# Patient Record
Sex: Male | Born: 1940
Health system: Southern US, Community
[De-identification: ages and names within clinical notes are randomized; demographics above are authoritative.]

## PROBLEM LIST (undated history)

## (undated) DIAGNOSIS — M549 Dorsalgia, unspecified: Secondary | ICD-10-CM

## (undated) DIAGNOSIS — E785 Hyperlipidemia, unspecified: Secondary | ICD-10-CM

## (undated) DIAGNOSIS — M4722 Other spondylosis with radiculopathy, cervical region: Secondary | ICD-10-CM

## (undated) DIAGNOSIS — C61 Malignant neoplasm of prostate: Secondary | ICD-10-CM

## (undated) DIAGNOSIS — F419 Anxiety disorder, unspecified: Secondary | ICD-10-CM

## (undated) DIAGNOSIS — K219 Gastro-esophageal reflux disease without esophagitis: Secondary | ICD-10-CM

## (undated) DIAGNOSIS — C801 Malignant (primary) neoplasm, unspecified: Secondary | ICD-10-CM

## (undated) DIAGNOSIS — I10 Essential (primary) hypertension: Secondary | ICD-10-CM

## (undated) DIAGNOSIS — T7840XA Allergy, unspecified, initial encounter: Secondary | ICD-10-CM

## (undated) DIAGNOSIS — J45909 Unspecified asthma, uncomplicated: Secondary | ICD-10-CM

## (undated) DIAGNOSIS — I480 Paroxysmal atrial fibrillation: Secondary | ICD-10-CM

## (undated) DIAGNOSIS — G8929 Other chronic pain: Secondary | ICD-10-CM

## (undated) HISTORY — DX: Malignant (primary) neoplasm, unspecified: C80.1

## (undated) HISTORY — DX: Unspecified asthma, uncomplicated: J45.909

## (undated) HISTORY — PX: PROSTATE BIOPSY: SHX241

## (undated) HISTORY — DX: Hyperlipidemia, unspecified: E78.5

## (undated) HISTORY — DX: Essential (primary) hypertension: I10

## (undated) HISTORY — PX: BACK SURGERY: SHX140

## (undated) HISTORY — PX: SPINE SURGERY: SHX786

## (undated) HISTORY — DX: Allergy, unspecified, initial encounter: T78.40XA

## (undated) HISTORY — DX: Other spondylosis with radiculopathy, cervical region: M47.22

## (undated) HISTORY — DX: Gastro-esophageal reflux disease without esophagitis: K21.9

---

## 2003-11-21 ENCOUNTER — Encounter: Admission: RE | Admit: 2003-11-21 | Discharge: 2003-11-21 | Payer: Self-pay | Admitting: Internal Medicine

## 2003-11-25 ENCOUNTER — Emergency Department (HOSPITAL_COMMUNITY): Admission: EM | Admit: 2003-11-25 | Discharge: 2003-11-25 | Payer: Self-pay | Admitting: Emergency Medicine

## 2004-01-13 ENCOUNTER — Ambulatory Visit (HOSPITAL_BASED_OUTPATIENT_CLINIC_OR_DEPARTMENT_OTHER): Admission: RE | Admit: 2004-01-13 | Discharge: 2004-01-13 | Payer: Self-pay | Admitting: Otolaryngology

## 2004-01-13 HISTORY — PX: OTHER SURGICAL HISTORY: SHX169

## 2004-06-25 ENCOUNTER — Ambulatory Visit (HOSPITAL_COMMUNITY): Admission: RE | Admit: 2004-06-25 | Discharge: 2004-06-25 | Payer: Self-pay | Admitting: Gastroenterology

## 2007-02-22 ENCOUNTER — Emergency Department (HOSPITAL_COMMUNITY): Admission: EM | Admit: 2007-02-22 | Discharge: 2007-02-22 | Payer: Self-pay | Admitting: Emergency Medicine

## 2008-03-17 ENCOUNTER — Encounter: Admission: RE | Admit: 2008-03-17 | Discharge: 2008-03-17 | Payer: Self-pay | Admitting: Gastroenterology

## 2009-03-02 ENCOUNTER — Encounter: Payer: Self-pay | Admitting: Cardiology

## 2009-03-10 ENCOUNTER — Encounter: Payer: Self-pay | Admitting: Cardiology

## 2009-04-24 ENCOUNTER — Ambulatory Visit: Payer: Self-pay | Admitting: Cardiology

## 2009-04-24 DIAGNOSIS — E7800 Pure hypercholesterolemia, unspecified: Secondary | ICD-10-CM | POA: Insufficient documentation

## 2009-06-02 ENCOUNTER — Encounter (HOSPITAL_COMMUNITY): Admission: RE | Admit: 2009-06-02 | Discharge: 2009-08-12 | Payer: Self-pay | Admitting: Cardiology

## 2009-06-02 ENCOUNTER — Ambulatory Visit: Payer: Self-pay

## 2009-06-02 ENCOUNTER — Ambulatory Visit: Payer: Self-pay | Admitting: Cardiovascular Disease

## 2009-06-02 ENCOUNTER — Ambulatory Visit: Payer: Self-pay | Admitting: Cardiology

## 2009-08-08 ENCOUNTER — Emergency Department (HOSPITAL_COMMUNITY): Admission: EM | Admit: 2009-08-08 | Discharge: 2009-08-08 | Payer: Self-pay | Admitting: Emergency Medicine

## 2009-08-10 ENCOUNTER — Encounter: Payer: Self-pay | Admitting: Internal Medicine

## 2009-08-10 ENCOUNTER — Encounter: Admission: RE | Admit: 2009-08-10 | Discharge: 2009-08-10 | Payer: Self-pay | Admitting: Internal Medicine

## 2009-08-19 ENCOUNTER — Ambulatory Visit: Payer: Self-pay | Admitting: Internal Medicine

## 2009-08-19 DIAGNOSIS — R05 Cough: Secondary | ICD-10-CM

## 2009-08-19 DIAGNOSIS — J45909 Unspecified asthma, uncomplicated: Secondary | ICD-10-CM

## 2009-08-19 DIAGNOSIS — J984 Other disorders of lung: Secondary | ICD-10-CM | POA: Insufficient documentation

## 2009-08-20 ENCOUNTER — Encounter: Admission: RE | Admit: 2009-08-20 | Discharge: 2009-08-20 | Payer: Self-pay | Admitting: Gastroenterology

## 2009-08-24 ENCOUNTER — Ambulatory Visit: Payer: Self-pay | Admitting: Internal Medicine

## 2009-08-24 DIAGNOSIS — T887XXA Unspecified adverse effect of drug or medicament, initial encounter: Secondary | ICD-10-CM

## 2009-08-25 ENCOUNTER — Ambulatory Visit: Payer: Self-pay | Admitting: Cardiovascular Disease

## 2009-08-26 LAB — CONVERTED CEMR LAB: IgE (Immunoglobulin E), Serum: 367.8 intl units/mL — ABNORMAL HIGH (ref 0.0–180.0)

## 2009-09-10 ENCOUNTER — Ambulatory Visit: Payer: Self-pay | Admitting: Internal Medicine

## 2009-10-13 ENCOUNTER — Ambulatory Visit: Payer: Self-pay | Admitting: Internal Medicine

## 2009-10-14 ENCOUNTER — Encounter: Payer: Self-pay | Admitting: Internal Medicine

## 2010-02-15 ENCOUNTER — Ambulatory Visit: Payer: Self-pay | Admitting: Internal Medicine

## 2010-02-18 ENCOUNTER — Telehealth: Payer: Self-pay | Admitting: Internal Medicine

## 2010-06-08 NOTE — Assessment & Plan Note (Signed)
Summary: Pulmonary/ ext summary f/u > try astepro   Copy to:  Dr. Tanya Nones Primary Provider/Referring Provider:  Rudi Heap, MD  CC:  Followup.  Pt states that his breathing has improved.  He states that the cough is only a little better.  He c/o "burning in throat"- mainly on the left side.  He states that he feels pressure in his throat.  He states that after being told he was allergic to cat dander he found a new home for his cat.Marland Kitchen  History of Present Illness: 77 yowm quit smoking around 1971 with mild asthma but no need for chronic inhaler but did notice problem with season changes and working out in cold weather and got episodes of bronchitis  2-3 times per year x years  August 19, 2009 cc cough onset early March 2011 similar onset much more severe burning cp and more sob and no response to [employer].  rec double up nexium = no relation to meals, took at bedtime, no difference, then dexilant > helped some with the burning.  no better with prednisone.  Cough is minimally productive of white mucus and constant urge to clear throat assoc with hoarseness an mild nasal congestion.  imp uacs rec max acid suppression and vicodin to control excess cough  August 24, 2009 Acute visit.  Pt was seen on 4/13 for inital consult for cough and dyspnea.  Today he c/o constant throat clearing- worse last night.  He states that his cough is much better but still has "tickle in throat".  He states that he woke up last night itching all over and relates this to vicodin- benadryl helped. now feels better but still coughing, no excess or discolored mucus.  rec sinus ct neg, allergy profile positive for cats, got rid of it.  Sep 10, 2009 Followup.  Pt states that his breathing has improved.  He states that the cough is only a little better.  He c/o "burning in throat"- mainly on the left side.  He states that he feels pressure in his throat no pattern. Every night between 3-5 am wakes up with mucus in throat, maybe a  tablespoon. Pt denies any significant  dysphagia, itching, sneezing,  nasal congestion or excess secretions,  fever, chills, sweats, unintended wt loss, pleuritic or exertional cp, hempoptysis, change in activity tolerance  orthopnea pnd or leg swelling. Pt also denies any obvious fluctuation in symptoms with weather or environmental change or other alleviating or aggravating factors x? some better with noct use of chlorpheniramine          Current Medications (verified): 1)  Pravastatin Sodium 40 Mg Tabs (Pravastatin Sodium) .... Take One Tablet Once Daily 2)  Niacin 500 Mg Tabs (Niacin) .... Take Two Tablets At Bedtime 3)  Bayer Low Strength 81 Mg Tbec (Aspirin) .... Take One Tab Daily 4)  Zolpidem Tartrate 10 Mg Tabs (Zolpidem Tartrate) .Marland Kitchen.. 1 At Bedtime As Needed 5)  Fiber 0.52 Gm Caps (Psyllium) .... 3 Once Daily 6)  Pepcid Ac Maximum Strength 20 Mg Tabs (Famotidine) .... One At Bedtime 7)  Proair Hfa 108 (90 Base) Mcg/act Aers (Albuterol Sulfate) .... 2 Puffs Every 4-6 Hours As Needed 8)  Nexium 40 Mg  Cpdr (Esomeprazole Magnesium) .... Take One 30-60 Min Before First and Last Meals of The Day 9)  [facility]vs Cold & Allergy Relief 4-10 Mg Tabs (Chlorpheniramine-Phenylephrine) .... As Directed As Needed  Allergies (verified): 1)  ! Codeine  Past History:  Past Medical History: 1. hyperlipidemia  2. BPH 3. GERD (clinical dx only)      - UGI 08/20/09   1.  Small sliding hiatal hernia.  No definite reflux.  Slight   prominence of the cricopharyngeus muscle.   2.  Minimal tertiary contractions distally.      - [facility] eval  09/15/09 Dr Madilyn Fireman 4. History of back surgery Cough     - PFT's 08/10/09 FEV1 2.55 (74%) ratio 73     - Sinus ct  August 25, 2009 >> mild chronic changes only     - Trial of astepro Sep 10, 2009 >> SPN  RUL x 7mm     - CT chest 08/10/09     - Placed in our tickle file for review 02/07/10  Vital Signs:  Patient profile:   70 year old male Weight:      202 pounds O2 Sat:       97 % on Room air Temp:     98.0 degrees F oral Pulse rate:   86 / minute BP sitting:   130 / 86  (left arm)  Vitals Entered By: Vernie Murders (Sep 10, 2009 8:55 AM)  O2 Flow:  Room air  Physical Exam  Additional Exam:  wt 204 August 19, 2009>  202 August 24, 2009  Hoarse amb wm nad with classic pseudowheeze resolves with purse lip maneuver and freq throat clearing HEENT mild turbinate edema.  Oropharynx no thrush or excess pnd or cobblestoning.  No JVD or cervical adenopathy. Mild accessory muscle hypertrophy. Trachea midline, nl thryroid. Chest was hyperinflated by percussion with diminished breath sounds and moderate increased exp time without wheeze. Hoover sign positive at mid inspiration. Regular rate and rhythm without murmur gallop or rub or increase P2 or edema.  Abd: no hsm, nl excursion. Ext warm without cyanosis or clubbing.     Impression & Recommendations:  Problem # 1:  COUGH (ICD-786.2)   Classic Upper airway cough syndrome, so named because it's frequently impossible to sort out how much is  CR/sinusitis with freq throat clearing (which can be related to primary GERD)   vs  causing  secondary extra esophageal GERD from wide swings in gastric pressure that occur with throat clearing, promoting self use of mint and menthol lozenges that reduce the lower esophageal sphincter tone and exacerbate the problem further These are the same pts who not infrequently have failed to tolerate ace inhibitors,  dry powder inhalers or biphosphonates or report having reflux symptoms that don't respond to standard doses of PPI   The standardized cough guidelines recently published in Chest are a 14 step process, not a single office visit,  and are intended  to address this problem logically,  with an alogrithm dependent on response to each progressive step  to determine a specific diagnosis with  minimal addtional testing needed. Therefore if compliance is an issue this empiric standardized  approach simply won't work.   next step is try to treat chronic rhinitis more consistently  Each maintenance medication was reviewed in detail including most importantly the difference between maintenance prns and under what circumstances the prns are to be used.  In addition, these two groups (for which the patient should keep up with refills) were distinguished from a third group :  meds that are used only short term with the intent to complete a course of therapy and then not refill them.  The med list was then fully reconciled and reorganized to reflect this important distinction.  Medications Added to Medication List This Visit: 1)  Astepro 0.15 % Soln (Azelastine hcl) .... 2 puffs at bedtime in each nostil 2)  Cvs Cold & Allergy Relief 4-10 Mg Tabs (Chlorpheniramine-phenylephrine) .... As directed as needed 3)  Prednisone 10 Mg Tabs (Prednisone) .... 4 each am x 2days, 2x2days, 1x2days and stop  Other Orders: Prescription Created Electronically [phone]) Est. Patient Level IV (96295)  Patient Instructions: 1)  No change in any meds for now except add astepro 2 puffs each nostril pointed toward ear on same side 2)  Unlike when you get a prescription for eyeglasses, it's not possible to always walk out of this or any medical office with a perfect prescription that is immediately effective  based on any test that we offer here.  On the contrary, it may take several weeks for the full impact of changes recommened today - hopefully you will respond well.  If not, then we'll adjust your medication on your next visit accordingly, knowing more then than we can possibly know now.    3)  NB the  ramp to expected improvement (and for that matter, worsening, if a chronic effective medication is stopped)  can be measured in weeks, not days, a common misconception because this is not Heartburn with no immediate cause and effect relationship so that response to therapy or lack thereof can be very difficult  to assess.  4)  Please schedule a follow-up appointment in 4 weeks, sooner if needed  5)  Dr Madilyn Fireman needs copy Prescriptions: PREDNISONE 10 MG  TABS (PREDNISONE) 4 each am x 2days, 2x2days, 1x2days and stop  #14 x 0   Entered and Authorized by:   Nyoka Cowden MD   Signed by:   Nyoka Cowden MD on 09/10/2009   Method used:   [facility]        [facility]  [address] [phone]* (retail)       [address]       [place], [place]  [address]       Ph: [phone]       Fax: [phone]   RxID:   [id number]

## 2010-06-08 NOTE — Progress Notes (Signed)
Summary: No CT needed  ---- Converted from flag ---- ---- 08/19/2009 2:45 PM, Nyoka Cowden MD wrote: make sure he had f/u ct by now ------------------------------  Pt was seen on 02/15/10 and had cxr- is a ct still needed? Pls advise thanks! Tyler Allison  February 18, 2010 10:49 AM no, placed new tickle for 6 months

## 2010-06-08 NOTE — Assessment & Plan Note (Signed)
Summary: Pulmonary/ ext summary f/u ov, needs ent f/u Ezzard Standing)   Copy to:  Dr. Tanya Nones Primary Provider/Referring Provider:  Rudi Heap, MD  CC:  1 month followup.  Pt states "throat still raw"- no better or worse.  He c/o prod cough with white to grey sputum x 1 wk.  .  History of Present Illness: 10 yowm quit smoking around 1971 with mild asthma but no need for chronic inhaler but did notice problem with season changes and working out in cold weather and got episodes of bronchitis  2-3 times per year x years  August 19, 2009 cc cough onset early March 2011 similar onset much more severe burning cp and more sob and no response to [employer].  rec double up nexium = no relation to meals, took at bedtime, no difference, then dexilant > helped some with the burning.  no better with prednisone.  Cough is minimally productive of white mucus and constant urge to clear throat assoc with hoarseness an mild nasal congestion.  imp uacs rec max acid suppression and vicodin to control excess cough  August 24, 2009 Acute visit.  Pt was seen on 4/13 for inital consult for cough and dyspnea.  Today he c/o constant throat clearing- worse last night.  He states that his cough is much better but still has "tickle in throat".  He states that he woke up last night itching all over and relates this to vicodin- benadryl helped. now feels better but still coughing, no excess or discolored mucus.  rec sinus ct neg, allergy profile positive for cats, got rid of it.  Sep 10, 2009 Followup.  Pt states that his breathing has improved.  He states that the cough is only a little better.  He c/o "burning in throat"- mainly on the left side.  He states that he feels pressure in his throat no pattern. Every night between 3-5 am wakes up with mucus in throat, maybe a tablespoon.? some better with noct use of chlorpheniramine.  Rec add astepro at hs > helped night time symptoms.    October 13, 2009 1 month followup.  Pt states "throat  still raw"- no better or worse.  He c/o prod cough with white to grey sputum x 1 wk.   no sob. Pt denies any significant sore throat, dysphagia, itching, sneezing,  nasal congestion or excess secretions,  fever, chills, sweats, unintended wt loss, pleuritic or exertional cp, hempoptysis, change in activity tolerance  orthopnea pnd or leg swelling          Current Medications (verified): 1)  Pravastatin Sodium 40 Mg Tabs (Pravastatin Sodium) .... Take One Tablet Once Daily 2)  Niacin 500 Mg Tabs (Niacin) .... Take Two Tablets At Bedtime 3)  Bayer Low Strength 81 Mg Tbec (Aspirin) .... Take One Tab Daily 4)  Fiber 0.52 Gm Caps (Psyllium) .... 3 Once Daily 5)  Pepcid Ac Maximum Strength 20 Mg Tabs (Famotidine) .... One At Bedtime 6)  Proair Hfa 108 (90 Base) Mcg/act Aers (Albuterol Sulfate) .... 2 Puffs Every 4-6 Hours As Needed 7)  Nexium 40 Mg  Cpdr (Esomeprazole Magnesium) .... Take One 30-60 Min Before First and Last Meals of The Day 8)  Cvs Cold & Allergy Relief 4-10 Mg Tabs (Chlorpheniramine-Phenylephrine) .... As Directed As Needed  Allergies (verified): 1)  ! Codeine  Past History:  Past Medical History: 1. hyperlipidemia  2. BPH 3. GERD (clinical dx only)      - UGI 08/20/09  1.  Small sliding hiatal hernia.  No definite reflux.  Slight   prominence of the cricopharyngeus muscle.   2.  Minimal tertiary contractions distally.      - [facility] eval  09/15/09 Dr Madilyn Fireman 4. History of back surgery Cough     - PFT's 08/10/09 FEV1 2.55 (74%) ratio 73     - Sinus ct  August 25, 2009 >> mild chronic changes only     - Trial of astepro Sep 10, 2009 >> better but still hoarse so ENT eval 10/14/09 >>> SPN  RUL x 7mm     - CT chest 08/10/09     - Placed in our tickle file for review 02/07/10  Vital Signs:  Patient profile:   70 year old male Weight:      204 pounds O2 Sat:      96 % on Room air Temp:     97.8 degrees F oral Pulse rate:   76 / minute BP sitting:   124 / 72  (left  arm)  Vitals Entered By: Vernie Murders (October 13, 2009 10:18 AM)  O2 Flow:  Room air  Physical Exam  Additional Exam:  wt 204 August 19, 2009>  202 August 24, 2009 > 204  Hoarse amb wm nad with classic pseudowheeze resolves with purse lip maneuver and freq throat clearing HEENT mild turbinate edema.  Oropharynx no thrush or excess pnd or cobblestoning.  No JVD or cervical adenopathy. Mild accessory muscle hypertrophy. Trachea midline, nl thryroid. Chest was hyperinflated by percussion with diminished breath sounds and moderate increased exp time without wheeze. Hoover sign positive at mid inspiration. Regular rate and rhythm without murmur gallop or rub or increase P2 or edema.  Abd: no hsm, nl excursion. Ext warm without cyanosis or clubbing.     Impression & Recommendations:  Problem # 1:  COUGH (ICD-786.2)  Orders: Prescription Created Electronically [phone]) Est. Patient Level IV (21308) ENT Referral (ENT)  I had an extended discussion with the patient today lasting 15 to 20 minutes of a 25 minute visit on the following issues:   Cough is better but still has prominent  Classic Upper airway cough syndrome, so named because it's frequently impossible to sort out how much is  CR/sinusitis with freq throat clearing (which can be related to primary GERD)   vs  causing  secondary extra esophageal GERD from wide swings in gastric pressure that occur with throat clearing, promoting self use of mint and menthol lozenges that reduce the lower esophageal sphincter tone and exacerbate the problem further These are the same pts who not infrequently have failed to tolerate ace inhibitors,  dry powder inhalers or biphosphonates or report having reflux symptoms that don't respond to standard doses of PPI  For now continue max rx of pnds and GERD and proceed with ENT eval.  In meantime start w/d of PPI to see if cough or hoarseness flare with taper  Medications Added to Medication List This Visit: 1)   Nexium 40 Mg Cpdr (Esomeprazole magnesium) .... Take  one 30-60 min before first meal of the day 2)  Astepro 0.15 % Soln (Azelastine hcl) .... 2 puffs twice daily  Patient Instructions: 1)  Reduce the nexium to Take  one 30-60 min before first meal of the day but continue pepcid at bedime @ 20 mg 2)  Resume astepro but take in 2 puffs twice daily each nostril  3)  See Patient Care Coordinator before leaving for  ENT referral 4)  Return in 02/2010 for follow up cxr Prescriptions: ASTEPRO 0.15 % SOLN (AZELASTINE HCL) 2 puffs twice daily  #1 x 11   Entered and Authorized by:   Nyoka Cowden MD   Signed by:   Nyoka Cowden MD on 10/13/2009   Method used:   [facility]        [facility]  [address] [id number]       [address]       [place], [place]  [address]       Ph: [phone] or [phone]       Fax: [phone]   RxID:   [id number]

## 2010-06-08 NOTE — Assessment & Plan Note (Signed)
Summary: Pulmonary/ eval cough   Visit Type:  Initial Consult Copy to:  Dr. Tanya Nones Primary Provider/Referring Provider:  Rudi Heap, MD  CC:  Cough and Dyspnea.  History of Present Illness: 46 yowm quit smoking around 1971 with mild asthma but no need for chronic inhaler but did notice problem with season changes and working out in cold weather and got episodes of bronchitis  2-3 times per year x years  August 19, 2009 cc cough onset early March 2011 similar onset much more severe burning cp and more sob and no response to [employer].  rec double up nexium = no relation to meals, took at bedtime, no difference, then dexilant > helped some with the burning.  no better with prednisone.  Cough is minimally productive of white mucus and constant urge to clear throat assoc with hoarseness an mild nasal congestion.  Pt denies any significant sore throat, dysphagia, itching, sneezing,  nasal congestion or excess secretions,  fever, chills, sweats, unintended wt loss, pleuritic or exertional cp, hempoptysis, change in activity tolerance  orthopnea pnd or leg swelling  Pt also denies any obvious fluctuation in symptoms with weather or environmental change or other alleviating or aggravating factors.       Current Medications (verified): 1)  Nexium 40 Mg Cpdr (Esomeprazole Magnesium) .Marland Kitchen.. 1 At Bedtime 2)  Pravastatin Sodium 40 Mg Tabs (Pravastatin Sodium) .... Take One Tablet Once Daily 3)  Fish Oil Maximum Strength 1200 Mg Caps (Omega-3 Fatty Acids) .... Two Times A Day 4)  Niacin 500 Mg Tabs (Niacin) .... Take Two Tablets At Bedtime 5)  Bayer Low Strength 81 Mg Tbec (Aspirin) .... Take One Tab Daily 6)  Vitamin D3 1000 Unit Tabs (Cholecalciferol) .... Take One Tablet Once Daily 7)  Proair Hfa 108 (90 Base) Mcg/act Aers (Albuterol Sulfate) .... 2 Puffs Every 4-6 Hours As Needed 8)  Dexilant 60 Mg Cpdr (Dexlansoprazole) .Marland Kitchen.. 1 Every Am 9)  Carafate 1 Gm Tabs (Sucralfate) .Marland Kitchen.. 1 Before Each Meal and 1  At Bedtime 10)  Benzonatate 200 Mg Caps (Benzonatate) .Marland Kitchen.. 1 Two Times A Day As Needed 11)  Mucinex 600 Mg Xr12h-Tab (Guaifenesin) .Marland Kitchen.. 1 To 2 Every 12 Hours As Needed 12)  Zolpidem Tartrate 10 Mg Tabs (Zolpidem Tartrate) .Marland Kitchen.. 1 At Bedtime As Needed 13)  Vitamin E 400 Unit Caps (Vitamin E) .... 2 Once Daily 14)  Vitamin D 1000 Unit Tabs (Cholecalciferol) .Marland Kitchen.. 1 Once Daily 15)  Fiber 0.52 Gm Caps (Psyllium) .... 3 Once Daily  Allergies (verified): 1)  ! Codeine  Past History:  Past Medical History: 1. hyperlipidemia  2. BPH 3. GERD 4. History of back surgery ?Asthma       - PFT's 08/10/09 FEV1 2.55 (74%) ratio 73 SPN  RUL x 7mm     - CT chest 08/10/09     - Placed in our tickle file for review 02/07/10  Family History: 11 maternal aunts and uncles with CAD.  2 uncles with MIs in their 30s. Brother with MI at 55. Mother with MI in her 66s.  Esophageal CA- Father  Social History: Tobacco Use - No, quit in 1971 Alcohol Use - no Drug Use - no Lives with wife in [place].  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   Review of Systems       The patient complains of shortness of breath with activity, shortness of breath at rest, non-productive cough, irregular heartbeats, sore throat, sneezing, and itching.  The patient denies productive cough, coughing  up blood, chest pain, acid heartburn, indigestion, loss of appetite, weight change, abdominal pain, difficulty swallowing, tooth/dental problems, headaches, nasal congestion/difficulty breathing through nose, ear ache, anxiety, depression, hand/feet swelling, joint stiffness or pain, rash, change in color of mucus, and fever.    Vital Signs:  Patient profile:   70 year old male Height:      71 inches Weight:      204 pounds BMI:     28.56 O2 Sat:      96 % on Room air Temp:     97.6 degrees F oral Pulse rate:   94 / minute BP sitting:   140 / 80  (left arm)  Vitals Entered By: Vernie Murders (August 19, 2009 2:18 PM)  O2 Flow:  Room  air  Physical Exam  Additional Exam:  wt 198 > 204 August 19, 2009 Hoarse amb wm nad with classic pseudowheeze resolves with purse lip maneuver  HEENT mild turbinate edema.  Oropharynx no thrush or excess pnd or cobblestoning.  No JVD or cervical adenopathy. Mild accessory muscle hypertrophy. Trachea midline, nl thryroid. Chest was hyperinflated by percussion with diminished breath sounds and moderate increased exp time without wheeze. Hoover sign positive at mid inspiration. Regular rate and rhythm without murmur gallop or rub or increase P2 or edema.  Abd: no hsm, nl excursion. Ext warm without cyanosis or clubbing.     Impression & Recommendations:  Problem # 1:  COUGH (ICD-786.2)  The most common causes of chronic cough in immunocompetent adults include: upper airway cough syndrome (UACS), previously referred to as postnasal drip syndrome,  caused by variety of rhinosinus conditions; (2) asthma; (3) GERD; (4) chronic bronchitis from cigarette smoking or other inhaled environmental irritants; (5) nonasthmatic eosinophilic bronchitis; and (6) bronchiectasis. These conditions, singly or in combination, have accounted for up to 94% of the causes of chronic cough in prospective studies.  This is most c/w Upper airway cough syndrome, so named because it's frequently impossible to sort out how much is LPR vs CR/sinusitis with freq throat clearing generating secondary extra esophageal GERD from wide swings in gastric pressure that occur with throat clearing, promoting self use of mint and menthol lozenges that reduce the lower esophageal sphincter tone and exacerbate the problem further.  These symptoms are easily confused with asthma/copd by even experienced pulmonogists because they overlap so much. These are the same pts who not infrequently have failed to tolerate ace inhibitors,  dry powder inhalers or biphosphonates or report having reflux symptoms that don't respond to standard doses of PPI  See  instructions for specific recommendations   The standardized cough guidelines recently published in Chest are a 14 step process, not a single office visit,  and are intended  to address this problem logically,  with an alogrithm dependent on response to each progressive step  to determine a specific diagnosis with  minimal addtional testing needed. Therefore if compliance is an issue this empiric standardized approach simply won't work.   Orders: New Patient Level V (22025)  Problem # 2:  ASTHMA (ICD-493.90) When respiratory symptoms begin well after a patient reports complete smoking cessation,  it is very hard to "blame" COPD  ie it doesn't make any more sense than hearing a  NASCAR driver wrecked his car while driving his kids to school or a Careers adviser sliced his hand off carving [place].  Once the high risk activity stops,  the symptoms should not suddenly erupt.  If so, the differential diagnosis should include  obesity/deconditioning,  LPR/Reflux, CHF, or side effect of medications .  Try rx of gerd, modify meds then needs repeat pft's if still symptomatic.  Problem # 3:  PULMONARY NODULE (ICD-518.89) 6 month f/u planned per guidlelines  Medications Added to Medication List This Visit: 1)  Nexium 40 Mg Cpdr (Esomeprazole magnesium) .Marland Kitchen.. 1 at bedtime 2)  Dexilant 60 Mg Cpdr (Dexlansoprazole) .Marland Kitchen.. 1 every am 3)  Dexilant 60 Mg Cpdr (Dexlansoprazole) .... Take  one 30-60 min before first meal of the day 4)  Zolpidem Tartrate 10 Mg Tabs (Zolpidem tartrate) .Marland Kitchen.. 1 at bedtime as needed 5)  Carafate 1 Gm Tabs (Sucralfate) .Marland Kitchen.. 1 before each meal and 1 at bedtime 6)  Benzonatate 200 Mg Caps (Benzonatate) .Marland Kitchen.. 1 two times a day as needed 7)  Mucinex 600 Mg Xr12h-tab (Guaifenesin) .Marland Kitchen.. 1 to 2 every 12 hours as needed 8)  Fiber 0.52 Gm Caps (Psyllium) .... 3 once daily 9)  Vitamin E 400 Unit Caps (Vitamin e) .... 2 once daily 10)  Vitamin D 1000 Unit Tabs (Cholecalciferol) .Marland Kitchen.. 1 once daily 11)   Pepcid Ac Maximum Strength 20 Mg Tabs (Famotidine) .... One at bedtime 12)  Proair Hfa 108 (90 Base) Mcg/act Aers (Albuterol sulfate) .... 2 puffs every 4-6 hours as needed 13)  Vicodin 5-500 Mg Tabs (Hydrocodone-acetaminophen) .... One every 4 hours if coughing on delsym  Complete Medication List: 1)  Pravastatin Sodium 40 Mg Tabs (Pravastatin sodium) .... Take one tablet once daily 2)  Niacin 500 Mg Tabs (Niacin) .... Take two tablets at bedtime 3)  Bayer Low Strength 81 Mg Tbec (Aspirin) .... Take one tab daily 4)  Dexilant 60 Mg Cpdr (Dexlansoprazole) .... Take  one 30-60 min before first meal of the day 5)  Zolpidem Tartrate 10 Mg Tabs (Zolpidem tartrate) .Marland Kitchen.. 1 at bedtime as needed 6)  Fiber 0.52 Gm Caps (Psyllium) .... 3 once daily 7)  Pepcid Ac Maximum Strength 20 Mg Tabs (Famotidine) .... One at bedtime 8)  Proair Hfa 108 (90 Base) Mcg/act Aers (Albuterol sulfate) .... 2 puffs every 4-6 hours as needed 9)  Vicodin 5-500 Mg Tabs (Hydrocodone-acetaminophen) .... One every 4 hours if coughing on delsym  Patient Instructions: 1)  Stop nexium, stop all oil based vitamins 2)  Continue dexilant Take  one 30-60 min before first meal of the day and Pepcid (famotidine) 20 mg at bedtime 3)  GERD (REFLUX)  is a common cause of respiratory symptoms. It commonly presents without heartburn and can be treated with medication, but also with lifestyle changes including avoidance of late meals, excessive alcohol, smoking cessation, and avoid fatty foods, chocolate, peppermint, colas, red wine, and acidic juices such as orange juice. NO MINT OR MENTHOL PRODUCTS SO NO COUGH DROPS  4)  USE SUGARLESS CANDY INSTEAD (jolley ranchers)  5)  NO OIL BASED VITAMINS 6)  Take delsym two tsp every 12 hours and add vicodin every 4 hours . Swallowing water or using ice chips/non mint and menthol containing candies (such as lifesavers or sugarless jolly ranchers) are also effective.  7)  Please schedule a follow-up  appointment in 2 weeks, sooner if needed  Prescriptions: VICODIN 5-500 MG TABS (HYDROCODONE-ACETAMINOPHEN) one every 4 hours if coughing on delsym  #40 x 0   Entered and Authorized by:   Nyoka Cowden MD   Signed by:   Nyoka Cowden MD on 08/19/2009   Method used:   Print then Give to Patient   RxID:   [id number]

## 2010-06-08 NOTE — Assessment & Plan Note (Signed)
Summary: Cardiology Nuclear Study  Nuclear Med Background Indications for Stress Test: Evaluation for Ischemia  Indications Comments: Abnormal GXT today  History: Asthma, GXT, Myocardial Perfusion Study, MVP  History Comments: 3/02 NFA:OZHYQM, EF62%; 06/02/09 GXT:(+)   Symptoms: DOE    Nuclear Pre-Procedure Cardiac Risk Factors: Family History - CAD, History of Smoking, Lipids Caffeine/Decaff Intake: None NPO After: 10:00 PM Lungs: Clear IV 0.9% NS with Angio Cath: 22g     IV Site: (L) AC IV Started by: Irean Hong RN Chest Size (in) 44     Height (in): 71 Weight (lb): 198 BMI: 27.72  Nuclear Med Study 1 or 2 day study:  1 day     Stress Test Type:  Stress Reading MD:  Charlton Haws, MD     Referring MD:  Marca Ancona, MD Resting Radionuclide:  Technetium 73m Tetrofosmin     Resting Radionuclide Dose:  11.0 mCi  Stress Radionuclide:  Technetium 24m Tetrofosmin     Stress Radionuclide Dose:  32.0 mCi   Stress Protocol Exercise Time (min):  10:16 min     Max HR:  148 bpm     Predicted Max HR:  151 bpm  Max Systolic BP: 185 mm Hg     Percent Max HR:  98.01 %     METS: 12.1 Rate Pressure Product:  57846    Stress Test Technologist:  [employer]a [employer] CMA-N     Nuclear Technologist:  Burna Mortimer Deal RT-N  Rest Procedure  Myocardial perfusion imaging was performed at rest 45 minutes following the intravenous administration of Myoview Technetium 18m Tetrofosmin.  Stress Procedure  The patient exercised for 10:16.  The patient stopped due to fatigue and denied any chest pain.  There were no diagnostic ST-T wave changes with exercise.  Myoview was injected at peak exercise and myocardial perfusion imaging was performed after a brief delay.  QPS Raw Data Images:  Normal; no motion artifact; normal heart/lung ratio. Stress Images:  NI: Uniform and normal uptake of tracer in all myocardial segments. Rest Images:  Normal homogeneous uptake in all areas of the myocardium. Subtraction  (SDS):  Normal Transient Ischemic Dilatation:  1.11  (Normal <1.22)  Lung/Heart Ratio:  .31  (Normal <0.45)  Quantitative Gated Spect Images QGS EDV:  94 ml QGS ESV:  39 ml QGS EF:  58 % QGS cine images:  normal  Findings Low risk nuclear study Clinically Abnormal (chest pain, ST abnormality, hypotension)      Overall Impression  Exercise Capacity: Good exercise capacity. BP Response: Normal blood pressure response. Clinical Symptoms: Dyspnea ECG Impression: 1mm ST segment depression in lateral leads Overall Impression: Low risk.  Positive ECG but norma images  Appended Document: Cardiology Nuclear Study Low risk study.  ECG was positive but good exercise tolerance and perfusion images normal.  Most likely false positive ECG.   Appended Document: Cardiology Nuclear Study gave pt results

## 2010-06-08 NOTE — Assessment & Plan Note (Signed)
Summary: Pulmonary/  adverse drug effect/ add chlorpheniramine   Copy to:  Dr. Tanya Nones Primary Provider/Referring Provider:  Rudi Heap, MD  CC:  Acute visit.  Pt was seen on 4/13 for inital consult for cough and dyspnea.  Today he c/o constant throat clearing- worse last night.  He states that his cough is much better but still has "tickle in throat".  He states that he woke up last night itching all over and relates this to vicodin- benadryl helped.  Marland Kitchen  History of Present Illness: 72 yowm quit smoking around 1971 with mild asthma but no need for chronic inhaler but did notice problem with season changes and working out in cold weather and got episodes of bronchitis  2-3 times per year x years  August 19, 2009 cc cough onset early March 2011 similar onset much more severe burning cp and more sob and no response to [employer].  rec double up nexium = no relation to meals, took at bedtime, no difference, then dexilant > helped some with the burning.  no better with prednisone.  Cough is minimally productive of white mucus and constant urge to clear throat assoc with hoarseness an mild nasal congestion. rec max acid suppression and vicodin to control excess cough  August 24, 2009 Acute visit.  Pt was seen on 4/13 for inital consult for cough and dyspnea.  Today he c/o constant throat clearing- worse last night.  He states that his cough is much better but still has "tickle in throat".  He states that he woke up last night itching all over and relates this to vicodin- benadryl helped. now feels better but still coughing, no excess or discolored mucus.  Pt denies any significant sore throat, dysphagia, itching, sneezing,  nasal congestion  fever, chills, sweats, unintended wt loss, pleuritic or exertional cp, hempoptysis, change in activity tolerance  orthopnea pnd or leg swelling .  Current Medications (verified): 1)  Pravastatin Sodium 40 Mg Tabs (Pravastatin Sodium) .... Take One Tablet Once Daily 2)   Niacin 500 Mg Tabs (Niacin) .... Take Two Tablets At Bedtime 3)  Bayer Low Strength 81 Mg Tbec (Aspirin) .... Take One Tab Daily 4)  Dexilant 60 Mg Cpdr (Dexlansoprazole) .... Take  One 30-60 Min Before First Meal of The Day 5)  Zolpidem Tartrate 10 Mg Tabs (Zolpidem Tartrate) .Marland Kitchen.. 1 At Bedtime As Needed 6)  Fiber 0.52 Gm Caps (Psyllium) .... 3 Once Daily 7)  Pepcid Ac Maximum Strength 20 Mg Tabs (Famotidine) .... One At Bedtime 8)  Proair Hfa 108 (90 Base) Mcg/act Aers (Albuterol Sulfate) .... 2 Puffs Every 4-6 Hours As Needed 9)  Vicodin 5-500 Mg Tabs (Hydrocodone-Acetaminophen) .... One Every 4 Hours If Coughing On Delsym 10)  Diphenhydramine Hcl 25 Mg Caps (Diphenhydramine Hcl) .... As Directed As Needed  Allergies (verified): 1)  ! Codeine  Past History:  Past Medical History: 1. hyperlipidemia  2. BPH 3. GERD (clinical dx only)      - UGI 08/20/09   1.  Small sliding hiatal hernia.  No definite reflux.  Slight   prominence of the cricopharyngeus muscle.   2.  Minimal tertiary contractions distally. 4. History of back surgery ?Asthma       - PFT's 08/10/09 FEV1 2.55 (74%) ratio 73     - Sinus ct ordered August 25, 2009 >> SPN  RUL x 7mm     - CT chest 08/10/09     - Placed in our tickle file for review 02/07/10  Vital Signs:  Patient profile:   70 year old male Weight:      202 pounds O2 Sat:      99 % on Room air Temp:     97.5 degrees F oral Pulse rate:   85 / minute BP sitting:   140 / 78  (left arm)  Vitals Entered By: Vernie Murders (August 24, 2009 11:59 AM)  O2 Flow:  Room air  Physical Exam  Additional Exam:  wt 204 August 19, 2009> 202 August 24, 2009  Hoarse amb wm nad with classic pseudowheeze resolves with purse lip maneuver  HEENT mild turbinate edema.  Oropharynx no thrush or excess pnd or cobblestoning.  No JVD or cervical adenopathy. Mild accessory muscle hypertrophy. Trachea midline, nl thryroid. Chest was hyperinflated by percussion with diminished breath  sounds and moderate increased exp time without wheeze. Hoover sign positive at mid inspiration. Regular rate and rhythm without murmur gallop or rub or increase P2 or edema.  Abd: no hsm, nl excursion. Ext warm without cyanosis or clubbing.     Impression & Recommendations:  Problem # 1:  COUGH (ICD-786.2)   Still feel most likely dx is  Classic Upper airway cough syndrome, so named because it's frequently impossible to sort out how much is  CR/sinusitis with freq throat clearing (which can be related to primary GERD)   vs  causing  secondary extra esophageal GERD from wide swings in gastric pressure that occur with throat clearing, promoting self use of mint and menthol lozenges that reduce the lower esophageal sphincter tone and exacerbate the problem further  These are the same pts who not infrequently have failed to tolerate ace inhibitors,  dry powder inhalers or biphosphonates or report having reflux symptoms that don't respond to standard doses of PPI  Try adding 1st get H1 per guidlines and short course of prednisone for any upper airway inflammatory component.  Even though UGI neg for spontaneous reflux, suspect secondary GERD from cough adding to urge to cough in cyclical pattern.  Problem # 2:  ADVERSE DRUG REACTION (ICD-995.20)  rx with H1 and short course of prednisone, no more vicodin  Orders: Est. Patient Level IV (78469)  Medications Added to Medication List This Visit: 1)  Diphenhydramine Hcl 25 Mg Caps (Diphenhydramine hcl) .... As directed as needed 2)  Nexium 40 Mg Cpdr (Esomeprazole magnesium) .... Take one 30-60 min before first and last meals of the day 3)  Prednisone 10 Mg Tabs (Prednisone) .... 4 each am x 2days, 2x2days, 1x2days and stop  Other Orders: [facility], [place], [place], [place], [place] [phone]) Misc. Referral (Misc. Ref)  Patient Instructions: 1)  Ok to substitute nexium Take one 30-60 min before first and last meals of the day and pepcid at  bedtime 2)  Prednisone 4 each am x 2days, 2x2days, 1x2days and stop  3)  Chrotrimeton 4 mg one every 4 hours as needed for drainage or tickle itching as needed 4)  Proaire 2 puffs every 4 hours if needed for short of breath 5)  Stop vicodin 6)  Delsym for cough 7)  See Patient Care Coordinator before leaving for sinus ct Prescriptions: PREDNISONE 10 MG  TABS (PREDNISONE) 4 each am x 2days, 2x2days, 1x2days and stop  #14 x 0   Entered and Authorized by:   Nyoka Cowden MD   Signed by:   Nyoka Cowden MD on 08/24/2009   Method used:   Electronically to  [facility]  [address] [id number] (retail)       [address]       [place], [place]  [address]       Ph: [phone]       Fax: [phone]   RxID:   [id number]

## 2010-06-08 NOTE — Assessment & Plan Note (Signed)
Summary: Pulmonary/ ext summary f/u ov for cough/ rul nodule   Copy to:  Dr. Tanya Nones Primary Provider/Referring Provider:  Rudi Heap, MD  CC:  Follow up with CXR.  States breathing is doing well.  Occ cough - prod with light gray mucus but states this has improved.  States he still has a "raspy" feeling in throat but not as bad as it was.  .  History of Present Illness: 66 yowm quit smoking around 1971 with mild asthma but no need for chronic inhaler but did notice problem with season changes and working out in cold weather and got episodes of bronchitis  2-3 times per year x years  August 19, 2009 cc cough onset early March 2011 similar onset much more severe burning cp and more sob and no response to [employer].  rec double up nexium = no relation to meals, took at bedtime, no difference, then dexilant > helped some with the burning.  no better with prednisone.  Cough is minimally productive of white mucus and constant urge to clear throat assoc with hoarseness an mild nasal congestion.  imp uacs rec max acid suppression and vicodin to control excess cough  August 24, 2009 Acute visit.  Pt was seen on 4/13 for inital consult for cough and dyspnea.  Today he c/o constant throat clearing- worse last night.  He states that his cough is much better but still has "tickle in throat".  He states that he woke up last night itching all over and relates this to vicodin- benadryl helped. now feels better but still coughing, no excess or discolored mucus.  rec sinus ct neg, allergy profile positive for cats, got rid of it.  Sep 10, 2009 Followup.  Pt states that his breathing has improved.  He states that the cough is only a little better.  He c/o "burning in throat"- mainly on the left side.  He states that he feels pressure in his throat no pattern. Every night between 3-5 am wakes up with mucus in throat, maybe a tablespoon.? some better with noct use of chlorpheniramine.  Rec add astepro at hs > helped night  time symptoms.    October 13, 2009 1 month followup.  Pt states "throat still raw"- no better or worse.  He c/o prod cough with white to grey sputum x 1 wk.   no sob.  see page 2       February 15, 2010 Follow up with CXR.  States breathing is doing well.  Occ cough - prod with light gray mucus but states this has improved.  States he still has a "raspy" feeling in throat but not as bad as it was.  no longer using ppi or pecid as rec and taking fish oil again but not noticing any worse on /off rx (not consistent with it either way)  Pt denies any significant sore throat, dysphagia, itching, sneezing,  nasal congestion or excess secretions,  fever, chills, sweats, unintended wt loss, pleuritic or exertional cp, hempoptysis, change in activity tolerance  orthopnea pnd or leg swelling   Current Medications (verified): 1)  Pravastatin Sodium 40 Mg Tabs (Pravastatin Sodium) .... Take One Tablet Once Daily 2)  Niacin 500 Mg Tabs (Niacin) .... Take Two Tablets At Bedtime 3)  Bayer Low Strength 81 Mg Tbec (Aspirin) .... Take One Tab Daily 4)  Fiber 0.52 Gm Caps (Psyllium) .... 3 Once Daily 5)  Proair Hfa 108 (90 Base) Mcg/act Aers (Albuterol Sulfate) .... 2 Puffs  Every 4-6 Hours As Needed 6)  Nexium 40 Mg  Cpdr (Esomeprazole Magnesium) .... Take  One 30-60 Min Before First Meal of The Day 7)  Cvs Cold & Allergy Relief 4-10 Mg Tabs (Chlorpheniramine-Phenylephrine) .... As Directed As Needed 8)  Astepro 0.15 % Soln (Azelastine Hcl) .... 2 Puffs Twice Daily As Needed 9)  Fish Oil 1000 Mg Caps (Omega-3 Fatty Acids) .... Take 1 Tablet By Mouth Once A Day  Allergies (verified): 1)  ! Codeine  Past History:  Past Medical History: 1. hyperlipidemia  2. BPH 3. GERD (clinical dx only)      - UGI 08/20/09   1.  Small sliding hiatal hernia.  No definite reflux.  Slight   prominence of the cricopharyngeus muscle.   2.  Minimal tertiary contractions distally.      - [facility] eval  09/15/09 Dr Madilyn Fireman 4. History  of back surgery Cough     - PFT's 08/10/09 FEV1 2.55 (74%) ratio 73     - Sinus ct  August 25, 2009 >> mild chronic changes only     - Trial of astepro Sep 10, 2009 >> better but still hoarse so ENT eval 10/14/09 >>> SPN  RUL x 7mm     - CT chest 08/10/09     - Placed in our tickle file for review 02/07/10 > not viz on plain cxr February 15, 2010   Vital Signs:  Patient profile:   70 year old male Height:      71 inches Weight:      203 pounds BMI:     28.42 O2 Sat:      95 % on Room air Temp:     98.1 degrees F oral Pulse rate:   67 / minute BP sitting:   110 / 80  (right arm) Cuff size:   regular  Vitals Entered By: Gweneth Dimitri RN (February 15, 2010 11:59 AM)  O2 Flow:  Room air CC: Follow up with CXR.  States breathing is doing well.  Occ cough - prod with light gray mucus but states this has improved.  States he still has a "raspy" feeling in throat but not as bad as it was.   Comments Medications reviewed with patient Daytime contact number verified with patient. Gweneth Dimitri RN  February 15, 2010 12:00 PM    Physical Exam  Additional Exam:  wt 204 August 19, 2009>  202 August 24, 2009 > 203 February 15, 2010  Hoarse amb wm nad with classic pseudowheeze resolves with purse lip maneuver and freq throat clearing HEENT mild turbinate edema.  Oropharynx no thrush or excess pnd or cobblestoning.  No JVD or cervical adenopathy. Mild accessory muscle hypertrophy. Trachea midline, nl thryroid. Chest was hyperinflated by percussion with diminished breath sounds and moderate increased exp time without wheeze. Hoover sign positive at mid inspiration. Regular rate and rhythm without murmur gallop or rub or increase P2 or edema.  Abd: no hsm, nl excursion. Ext warm without cyanosis or clubbing.     CXR  Procedure date:  02/15/2010  Findings:       February 15, 2010 Follow up with CXR.  States breathing is doing well.  Occ cough - prod with light gray mucus but states this has improved.   States he still has a 'raspy' feeling in throat but not as bad as it was.  no longer using ppi or pecid as rec and taki  Impression & Recommendations:  Problem # 1:  PULMONARY NODULE (ICD-518.89)  Will need f/u CT chest 08/2010 as can't be seen on plain cxr, placed in tickle file for recall  Problem # 2:  COUGH (ICD-786.2)   Classic Upper airway cough syndrome, so named because it's frequently impossible to sort out how much is  CR/sinusitis with freq throat clearing (which can be related to primary GERD)   vs  causing  secondary extra esophageal GERD from wide swings in gastric pressure that occur with throat clearing, promoting self use of mint and menthol lozenges that reduce the lower esophageal sphincter tone and exacerbate the problem further These are the same pts who not infrequently have failed to tolerate ace inhibitors,  dry powder inhalers or biphosphonates or report having reflux symptoms that don't respond to standard doses of PPI  NB the  ramp to expected improvement (and for that matter, worsening, if a chronic effective medication is stopped)  can be measured in weeks, not days, a common misconception because this is not Heartburn with no immediate cause and effect relationship so that response to therapy or lack thereof can be very difficult to assess.   For now defer rx of throat clearing to ENT  Orders: Est. Patient Level IV (16109)  Medications Added to Medication List This Visit: 1)  Astepro 0.15 % Soln (Azelastine hcl) .... 2 puffs twice daily as needed  Other Orders: T-2 View CXR (71020TC)  Patient Instructions: 1)  Stay off fish oil 2)  If throat continues to bother you see Dr Ezzard Standing   Immunization History:  Influenza Immunization History:    Influenza:  historical (02/09/2010)

## 2010-06-11 NOTE — Consult Note (Signed)
Summary: Daiva Huge MD  Daiva Huge MD   Imported By: Lester [place] 11/02/2009 10:37:24  _____________________________________________________________________  External Attachment:    Type:   Image     Comment:   External Document

## 2010-08-09 ENCOUNTER — Telehealth: Payer: Self-pay | Admitting: Internal Medicine

## 2010-08-09 DIAGNOSIS — R911 Solitary pulmonary nodule: Secondary | ICD-10-CM

## 2010-08-09 NOTE — Telephone Encounter (Signed)
Spoke with pt and notified due for ct chest.  Pt verbalized understanding.  Order was sent to [facility] to sched scan

## 2010-08-12 ENCOUNTER — Ambulatory Visit
Admission: RE | Admit: 2010-08-12 | Discharge: 2010-08-12 | Disposition: A | Discharge status: Home / Self Care | Payer: Medicare Other | Source: Ambulatory Visit | Attending: Internal Medicine | Admitting: Internal Medicine

## 2010-08-12 DIAGNOSIS — R911 Solitary pulmonary nodule: Secondary | ICD-10-CM

## 2010-08-16 NOTE — Progress Notes (Signed)
Quick Note:  Spoke with pt and notified of results per Dr. Wert. Pt verbalized understanding and denied any questions.  ______ 

## 2010-09-13 ENCOUNTER — Other Ambulatory Visit: Payer: Self-pay | Admitting: Neurological Surgery

## 2010-09-13 DIAGNOSIS — M545 Low back pain: Secondary | ICD-10-CM

## 2010-09-14 ENCOUNTER — Ambulatory Visit
Admission: RE | Admit: 2010-09-14 | Discharge: 2010-09-14 | Disposition: A | Discharge status: Home / Self Care | Payer: Medicare Other | Source: Ambulatory Visit | Attending: Neurological Surgery | Admitting: Neurological Surgery

## 2010-09-14 DIAGNOSIS — M545 Low back pain: Secondary | ICD-10-CM

## 2010-09-16 ENCOUNTER — Other Ambulatory Visit: Payer: Medicare Other

## 2010-09-24 NOTE — Op Note (Signed)
NAME:  Tyler Allison, Tyler Allison                          ACCOUNT NO.:  [id number]   MEDICAL RECORD NO.:  [id number]                   PATIENT TYPE:  AMB   LOCATION:  DSC                                  FACILITY:  [facility]   PHYSICIAN:  Kristine Garbe. Ezzard Standing, M.D.         DATE OF BIRTH:  Jan 09, 1941   DATE OF PROCEDURE:  01/13/2004  DATE OF DISCHARGE:                                 OPERATIVE REPORT   PREOPERATIVE DIAGNOSES:  1.  Chronic left tympanic membrane perforation.  2.  Questionable mastoid disease.  3.  Dizziness.   POSTOPERATIVE DIAGNOSES:  1.  Chronic left tympanic membrane perforation.  2.  Questionable mastoid disease.  3.  Dizziness.   OPERATION:  Tympanoplasty with mastoidectomy.   SURGEON:  Narda Bonds.   ANESTHESIA:  General endotracheal.   COMPLICATIONS:  None.   BRIEF CLINICAL NOTE:  Tyler Allison is a 70 year old gentleman who has had  dizziness now for several months.  He had a CT scan which showed some  opacity of his left mastoid region inferiorly.  He had had previous surgery  by Dr. Maple Mirza 8 or 9 years ago where he had mastoidectomy.  On exam in  office, he has a large central left TM perforation approximately 40 to 50%  which has been dry with no drainage, and middle ear space is clear.  On  review of his CT scan, he did have some opacity of the inferior portion of  the mastoid area, but the previous mastoidectomy area was clear. The  superior mastoid air cells were all clear.  He has had ENG testing which did  not show any gross abnormality of his vestibular apparatus or function.  He  does have approximately 15 to 20 decibel conductive hearing loss from the  large central TM perforation.  I discussed with him concerning possible  tympanoplasty to repair the perforation and improve his hearing, that this  may or may not improve his dizziness.  At the time of the tympanoplasty, we  will also reexamine his mastoid area to make sure he has no significant  disease  of the mastoid region.  He is taken to the operating room at this  time for tympanoplasty and mastoidectomy.   DESCRIPTION OF PROCEDURE:  After adequate endotracheal anesthesia, the  patient received 1 gm of Ancef IV preoperatively.  The area was prepped with  Betadine solution and draped out with sterile towels.  The ear canal and  postauricular area was then injected with Xylocaine with epinephrine for  hemostasis. A posterior incision was made behind the left ear.  This was  carried down to the mastoid tip.  First, a temporalis fascia graft was  harvested and set aside to dry.  The mastoid cavity was then entered.  The  mastoid area was clear of any significant disease. There was some sclerosis  of the inferior portion of the mastoid cavity and some old deposits of  previous bone from previous surgery, but there was really no evidence of  inflammation, granulation tissue, and no drainage within the mastoid area.  The inferior mastoid cells were opened up, and they were clear of any  significant disease as was the remaining mastoid cavity.  The vertical,  horizontal and posterior semicircular canals could be visualized, and there  was no abnormality noted around the vestibular apparatus.  Next, the ear  canal was opened from postauricular approach.  The perforation could be  visualized.  The edges of the perforation were rimmed with cupped forceps  and straight pick.  There was some tympanosclerosis inferiorly that was  removed.  Posterior based tympanomeatal flap was then elevated onto the  annulus.  Cotton pledgets soaked in adrenaline were placed for hemostasis.  These have been removed, and the annulus was elevated and middle ear space  was entered.  There were some adhesions from previous surgery, but the  middle ear space was otherwise dry.  I could visualize the incus and stapes  super structure, but it was difficult to visualize the oval window because  of overhanging bone  posteriorly and superiorly, but the stapes  superstructure was easily mobile, and the small conductive hearing loss  previous audiogram I suspect was related to the rather large TM perforation.  The fascia graft was cut to appropriate size and placed medial to the  tympanomeatal flap underneath the malleus.  The middle ear space was then  packed with Gelfoam soaked in Coly-Mycin.  The tympanomeatal flap was  brought back down. The fascial graft covered the entire perforation.  Photos  were obtained.  The ear canal was then packed with Gelfoam soaked in Coly-  Mycin.  Postauricular and mastoid area was likewise packed with Gelfoam  soaked in Coly-Mycin.  Postauricular incision was closed with 2-0 chromic  suture subcutaneously and 4-0 nylon on the skin.  Remaining ear canal was  packed with Gelfoam soaked in Coly-Mycin.  Mastoid dressing was applied.  Patient was awoken from anesthesia and transferred to recovery in postop  doing well.   DISPOSITION:  Tyler Allison will be  discharged home later this morning on  Keflex 500 mg b.i.d. for 1 week, Tylenol or Vicodin p.r.n. pain.  We will  have him follow up in my office in 1 week for recheck and remove  postauricular sutures.  He is instructed in removing the mastoid dressing in  the morning and keeping water out of the left ear canal until follow-up  visit.                                               Kristine Garbe. Ezzard Standing, M.D.    CEN/MEDQ  D:  01/13/2004  T:  01/13/2004  Job:  [id number]   cc:   Titus Dubin. Alwyn Ren, M.D. [facility]

## 2010-10-12 ENCOUNTER — Other Ambulatory Visit: Payer: Self-pay | Admitting: Neurological Surgery

## 2010-10-12 DIAGNOSIS — M545 Low back pain: Secondary | ICD-10-CM

## 2010-10-12 DIAGNOSIS — M79604 Pain in right leg: Secondary | ICD-10-CM

## 2010-10-13 ENCOUNTER — Ambulatory Visit
Admission: RE | Admit: 2010-10-13 | Discharge: 2010-10-13 | Disposition: A | Discharge status: Home / Self Care | Payer: 59 | Source: Ambulatory Visit | Attending: Neurological Surgery | Admitting: Neurological Surgery

## 2010-10-13 ENCOUNTER — Ambulatory Visit
Admission: RE | Admit: 2010-10-13 | Discharge: 2010-10-13 | Disposition: A | Discharge status: Home / Self Care | Payer: Medicare Other | Source: Ambulatory Visit | Attending: Neurological Surgery | Admitting: Neurological Surgery

## 2010-10-13 DIAGNOSIS — M79604 Pain in right leg: Secondary | ICD-10-CM

## 2010-10-13 DIAGNOSIS — M545 Low back pain: Secondary | ICD-10-CM

## 2010-11-18 ENCOUNTER — Encounter (HOSPITAL_COMMUNITY)
Admission: RE | Admit: 2010-11-18 | Discharge: 2010-11-18 | Disposition: A | Discharge status: Home / Self Care | Payer: Medicare Other | Source: Ambulatory Visit | Attending: Neurological Surgery | Admitting: Neurological Surgery

## 2010-11-18 LAB — DIFFERENTIAL
Eosinophils Absolute: 0.4 10*3/uL (ref 0.0–0.7)
Eosinophils Relative: 5 % (ref 0–5)
Monocytes Absolute: 1.3 10*3/uL — ABNORMAL HIGH (ref 0.1–1.0)
Neutro Abs: 3.9 10*3/uL (ref 1.7–7.7)
Neutrophils Relative %: 51 % (ref 43–77)

## 2010-11-18 LAB — PROTIME-INR
INR: 0.93 (ref 0.00–1.49)
Prothrombin Time: 12.7 seconds (ref 11.6–15.2)

## 2010-11-18 LAB — BASIC METABOLIC PANEL
BUN: 16 mg/dL (ref 6–23)
Glucose, Bld: 91 mg/dL (ref 70–99)
Potassium: 5.1 mEq/L (ref 3.5–5.1)
Sodium: 136 mEq/L (ref 135–145)

## 2010-11-18 LAB — CBC
HCT: 42.3 % (ref 39.0–52.0)
Hemoglobin: 15.1 g/dL (ref 13.0–17.0)
MCHC: 35.7 g/dL (ref 30.0–36.0)
Platelets: 284 10*3/uL (ref 150–400)
RDW: 12.5 % (ref 11.5–15.5)
WBC: 7.8 10*3/uL (ref 4.0–10.5)

## 2010-11-18 LAB — TYPE AND SCREEN: ABO/RH(D): A POS

## 2010-11-18 LAB — ABO/RH: ABO/RH(D): A POS

## 2010-11-18 LAB — APTT: aPTT: 25 seconds (ref 24–37)

## 2010-11-26 ENCOUNTER — Inpatient Hospital Stay (HOSPITAL_COMMUNITY): Payer: Medicare Other

## 2010-11-26 ENCOUNTER — Inpatient Hospital Stay (HOSPITAL_COMMUNITY)
Admission: RE | Admit: 2010-11-26 | Discharge: 2010-11-28 | DRG: 458 | Disposition: A | Discharge status: Home / Self Care | Payer: Medicare Other | Source: Ambulatory Visit | Attending: Neurological Surgery | Admitting: Neurological Surgery

## 2010-11-26 DIAGNOSIS — M4120 Other idiopathic scoliosis, site unspecified: Principal | ICD-10-CM | POA: Diagnosis present

## 2010-11-26 DIAGNOSIS — M51379 Other intervertebral disc degeneration, lumbosacral region without mention of lumbar back pain or lower extremity pain: Secondary | ICD-10-CM | POA: Diagnosis present

## 2010-11-26 DIAGNOSIS — M5137 Other intervertebral disc degeneration, lumbosacral region: Secondary | ICD-10-CM | POA: Diagnosis present

## 2010-11-26 DIAGNOSIS — Z01812 Encounter for preprocedural laboratory examination: Secondary | ICD-10-CM

## 2010-12-03 NOTE — Discharge Summary (Signed)
  NAME:  ARUL, FARABEE NO.:  [id number]  MEDICAL RECORD NO.:  [id number]  LOCATION:  3005                         FACILITY:  [facility]  PHYSICIAN:  Tia Alert, MD     DATE OF BIRTH:  11-02-1940  DATE OF ADMISSION:  11/26/2010 DATE OF DISCHARGE:  11/28/2010                              DISCHARGE SUMMARY   ADMITTING DIAGNOSIS:  Degenerative disk disease with scoliosis and severe foraminal stenosis with right leg pain.  POSTOPERATIVE DIAGNOSIS:  Degenerative disk disease with scoliosis and severe foraminal stenosis with right leg pain.  PROCEDURE:  Posterior lumbar interbody fusion L4-5.  BRIEF HISTORY OF PRESENT ILLNESS:  Mr. Nazir is a 70 year old gentleman who presented with severe right leg pain.  He had an MRI and a CT myelogram which suggested severe foraminal stenosis at L4-5 on the right.  I recommended a posterior lumbar interbody fusion in hopes of improving his pain syndrome.  He understood the risks, benefits and expected outcome and wished to proceed.  HOSPITAL COURSE:  The patient was admitted on November 28, 2010, taken to the operating room where he underwent a posterior lumbar interbody fusion at L4-5.  The patient tolerated the procedure well, was taken to the recovery room and then to the floor in stable condition.  For details of the operative procedure, please see the dictated operative note.  The patient's hospital course was routine.  There were no complications.  He remained afebrile with stable vital signs.  His wound remained clean, dry, and intact.  He had resolution of his right leg pain.  He had appropriate back soreness and was taking very little pain medications.  He was ambulating without difficulty.  He was voiding without difficulty.  He was discharged home in stable condition on November 28, 2010, with plans to follow up in 2 weeks.  FINAL DIAGNOSIS:  Posterior lumbar interbody fusion L4-5.     Tia Alert,  MD     DSJ/MEDQ  D:  11/28/2010  T:  11/28/2010  Job:  [id number]  Electronically Signed by Marikay Alar MD on 12/03/2010 11:36:41 AM

## 2010-12-03 NOTE — Op Note (Signed)
NAME:  Tyler Allison, Tyler Allison NO.:  [id number]  MEDICAL RECORD NO.:  [id number]  LOCATION:  3005                         FACILITY:  [facility]  PHYSICIAN:  Tia Alert, MD     DATE OF BIRTH:  August 09, 1940  DATE OF PROCEDURE:  11/26/2010 DATE OF DISCHARGE:                              OPERATIVE REPORT   PREOPERATIVE DIAGNOSIS:  Degenerative disk disease L4-5 with scoliosis and severe neural foraminal stenosis L4-5 on the right with right L4 radiculopathy.  POSTOPERATIVE DIAGNOSIS:  Degenerative disk disease L4-5 with scoliosis and severe neural foraminal stenosis L4-5 on the right with right L4 radiculopathy.  PROCEDURE: 1. Decompressive lumbar laminectomy, hemi-facetectomy and     foraminotomies at L4-5 bilaterally for decompression of the L4 and     the L5 nerve roots requiring more work than it would require of a     simple PLIF procedure in order to adequately decompress the neural     elements. 2. Intertransverse arthrodesis L4-5 bilaterally utilizing local     autograft and morselized allograft. 3. Posterior lumbar interbody fusion L4-5 utilizing 9 mm PEEK     interbody cages packed with local autograft and morselized     allograft. 4. Nonsegmental fixation L4-5 utilizing the globus pedicle screw     system.  SURGEON:  Tia Alert, MD  ASSISTANT:  Reinaldo Meeker, MD  ANESTHESIA:  General endotracheal.  COMPLICATIONS:  None apparent.  INDICATIONS FOR PROCEDURE:  Tyler Allison is a 70 year old gentleman who presented with severe right leg pain.  He had an MRI and a CT myelogram which suggested severe foraminal stenosis at L4-5.  We recommended a posterior lumbar interbody fusion at L4-5.  He understood the risks, benefits, expected outcome and wished to proceed.  DESCRIPTION OF PROCEDURE:  The patient was taken to the operating room and after induction of adequate generalized endotracheal anesthesia he was rolled into prone position on chest rolls and  all pressure points were padded.  His lumbar region was cleaned with Hibiclens and then prepped with DuraPrep and then draped in usual sterile fashion.  5 mL of local anesthesia was injected and a dorsal midline incision was made and carried down to the lumbosacral fascia.  The fascia was opened and the paraspinous muscular was taken down in subperiosteal fashion to expose L4-5.  Intraoperative fluoroscopy confirmed my level and then I took the dissection out over the facets to expose the transverse processes of L4- L5.  I then removed the spinous process and starting my laminectomy utilizing Kerrison punch, Leksell rongeurs and a high-speed drill. Because of the severe foraminal stenosis, I had to perform a complete facetectomy on the patient's right side.  I undercut the L3 lamina to adequately decompress the L4 nerve roots.  I followed the L4 nerve roots out into their respective foramina.  This required much more bony work than would be required to simply expose the disk space for a PLIF procedure and this was done in order to adequately decompress the L4 and the L5 nerve roots and adequately in order to address his radicular pain.  Once the decompression was complete, I coagulated the epidural venous vasculature.  I cut the disk space sharply.  He was collapsed on the right side because of the scoliosis.  I used sequential distraction and distracted up to 9 mm.  I then prepared the endplates with curettes and rotating cutters.  A near complete diskectomy was performed.  The midline was prepared with Epstein curettes.  I then used 9-mm PEEK interbody cages packed with local autograft and morselized allograft and tapped these into position bilaterally.  The midline was packed with local autograft and morselized allograft.  Once my PLIF was completed, I turned my attention to the nonsegmental fixation.  I localized the pedicle screw entry zones utilizing surface landmarks under AP  and lateral fluoroscopy.  I probed each pedicle with pedicle probe, tapped each pedicle with 5.5 tap and then placed 6.5 x 45 mm pedicle screws in the L4-L5 pedicles and then checked these with AP and lateral fluoroscopy. I decorticated the transverse processes on the patient's left side and placed a mixture of local autograft and morselized allograft over these to perform intertransverse arthrodesis.  I then placed lordotic rods into multiaxial screw heads of the pedicle screws and locked these into position with the locking caps and anti-torque device.  I achieved compression on the graft on the patient's left side because of his scoliosis but did not do so on the right side because of his preoperative right foraminal stenosis.  I then irrigated with saline solution containing bacitracin, dried all bleeding points, lined the dura with Gelfoam, placed a medium Hemovac drain through a separate stab incision, inspected my nerve roots once again to make sure they are adequately decompressed by passing a coronary dilator along them.  I then closed the muscle and the fascia with 0 Vicryl, closed the subcutaneous and subcuticular tissue with 2-0 and 3-0 Vicryl, and closed the skin with Benzoin and Steri-Strips.  The drapes were removed. Sterile dressing was applied.  The patient was awakened from general anesthesia and transferred to the recovery room in stable condition.  At the end of the procedure, all sponge, needle and instrument counts were correct.     Tia Alert, MD     DSJ/MEDQ  D:  11/26/2010  T:  11/27/2010  Job:  [id number]  Electronically Signed by Marikay Alar MD on 12/03/2010 11:36:38 AM

## 2010-12-21 ENCOUNTER — Ambulatory Visit
Admission: RE | Admit: 2010-12-21 | Discharge: 2010-12-21 | Disposition: A | Discharge status: Home / Self Care | Payer: 59 | Source: Ambulatory Visit | Attending: Neurological Surgery | Admitting: Neurological Surgery

## 2010-12-21 ENCOUNTER — Other Ambulatory Visit: Payer: Self-pay | Admitting: Neurological Surgery

## 2010-12-21 DIAGNOSIS — M5137 Other intervertebral disc degeneration, lumbosacral region: Secondary | ICD-10-CM

## 2010-12-21 DIAGNOSIS — M4120 Other idiopathic scoliosis, site unspecified: Secondary | ICD-10-CM

## 2010-12-21 DIAGNOSIS — M48061 Spinal stenosis, lumbar region without neurogenic claudication: Secondary | ICD-10-CM

## 2011-02-16 LAB — POCT URINALYSIS DIP (DEVICE)
Glucose, UA: NEGATIVE
Hgb urine dipstick: NEGATIVE
Nitrite: NEGATIVE
Operator id: 247071
Urobilinogen, UA: 0.2

## 2011-02-16 LAB — I-STAT 8, (EC8 V) (CONVERTED LAB)
Chloride: 102
Glucose, Bld: 112 — ABNORMAL HIGH
Hemoglobin: 14.3
Potassium: 4.5
Sodium: 135
TCO2: 29
pH, Ven: 7.443 — ABNORMAL HIGH

## 2011-02-16 LAB — POCT I-STAT CREATININE: Operator id: 282201

## 2011-02-16 LAB — URINE CULTURE: Colony Count: NO GROWTH

## 2011-03-07 ENCOUNTER — Ambulatory Visit
Admission: RE | Admit: 2011-03-07 | Discharge: 2011-03-07 | Disposition: A | Discharge status: Home / Self Care | Payer: 59 | Source: Ambulatory Visit | Attending: Neurological Surgery | Admitting: Neurological Surgery

## 2011-03-07 ENCOUNTER — Other Ambulatory Visit: Payer: Self-pay | Admitting: Neurological Surgery

## 2011-03-07 DIAGNOSIS — M961 Postlaminectomy syndrome, not elsewhere classified: Secondary | ICD-10-CM

## 2011-03-07 DIAGNOSIS — M542 Cervicalgia: Secondary | ICD-10-CM

## 2011-03-07 DIAGNOSIS — IMO0002 Reserved for concepts with insufficient information to code with codable children: Secondary | ICD-10-CM

## 2011-03-07 DIAGNOSIS — M48061 Spinal stenosis, lumbar region without neurogenic claudication: Secondary | ICD-10-CM

## 2011-03-08 ENCOUNTER — Ambulatory Visit
Admission: RE | Admit: 2011-03-08 | Discharge: 2011-03-08 | Disposition: A | Discharge status: Home / Self Care | Payer: 59 | Source: Ambulatory Visit | Attending: Neurological Surgery | Admitting: Neurological Surgery

## 2011-03-08 DIAGNOSIS — M542 Cervicalgia: Secondary | ICD-10-CM

## 2011-03-08 MED ORDER — GADOBENATE DIMEGLUMINE 529 MG/ML IV SOLN
17.0000 mL | Freq: Once | INTRAVENOUS | Status: AC | PRN
  Administered 2011-03-08: 17 mL via INTRAVENOUS

## 2011-07-18 ENCOUNTER — Ambulatory Visit
Admission: RE | Admit: 2011-07-18 | Discharge: 2011-07-18 | Disposition: A | Discharge status: Home / Self Care | Payer: 59 | Source: Ambulatory Visit | Attending: Neurological Surgery | Admitting: Neurological Surgery

## 2011-07-18 ENCOUNTER — Other Ambulatory Visit: Payer: Self-pay | Admitting: Neurological Surgery

## 2011-07-18 DIAGNOSIS — M5137 Other intervertebral disc degeneration, lumbosacral region: Secondary | ICD-10-CM

## 2012-03-02 ENCOUNTER — Emergency Department (HOSPITAL_COMMUNITY): Payer: 59

## 2012-03-02 ENCOUNTER — Emergency Department (HOSPITAL_COMMUNITY)
Admission: EM | Admit: 2012-03-02 | Discharge: 2012-03-02 | Disposition: A | Discharge status: Home / Self Care | Payer: 59 | Attending: Emergency Medicine | Admitting: Emergency Medicine

## 2012-03-02 ENCOUNTER — Encounter (HOSPITAL_COMMUNITY): Payer: Self-pay | Admitting: Emergency Medicine

## 2012-03-02 DIAGNOSIS — N509 Disorder of male genital organs, unspecified: Secondary | ICD-10-CM | POA: Insufficient documentation

## 2012-03-02 DIAGNOSIS — Z7982 Long term (current) use of aspirin: Secondary | ICD-10-CM | POA: Insufficient documentation

## 2012-03-02 DIAGNOSIS — M549 Dorsalgia, unspecified: Secondary | ICD-10-CM | POA: Insufficient documentation

## 2012-03-02 DIAGNOSIS — Z79899 Other long term (current) drug therapy: Secondary | ICD-10-CM | POA: Insufficient documentation

## 2012-03-02 DIAGNOSIS — R1030 Lower abdominal pain, unspecified: Secondary | ICD-10-CM

## 2012-03-02 DIAGNOSIS — R109 Unspecified abdominal pain: Secondary | ICD-10-CM | POA: Insufficient documentation

## 2012-03-02 HISTORY — DX: Other chronic pain: G89.29

## 2012-03-02 HISTORY — DX: Other chronic pain: M54.9

## 2012-03-02 LAB — URINE MICROSCOPIC-ADD ON

## 2012-03-02 LAB — URINALYSIS, ROUTINE W REFLEX MICROSCOPIC
Bilirubin Urine: NEGATIVE
Ketones, ur: 15 mg/dL — AB
Leukocytes, UA: NEGATIVE
Nitrite: NEGATIVE
Specific Gravity, Urine: 1.019 (ref 1.005–1.030)
Urobilinogen, UA: 0.2 mg/dL (ref 0.0–1.0)

## 2012-03-02 MED ORDER — ONDANSETRON HCL 4 MG/2ML IJ SOLN
4.0000 mg | Freq: Once | INTRAMUSCULAR | Status: AC
  Administered 2012-03-02: 4 mg via INTRAVENOUS
  Filled 2012-03-02: qty 2

## 2012-03-02 MED ORDER — MORPHINE SULFATE 4 MG/ML IJ SOLN
2.0000 mg | Freq: Once | INTRAMUSCULAR | Status: AC
  Administered 2012-03-02: 2 mg via INTRAVENOUS
  Filled 2012-03-02: qty 1

## 2012-03-02 MED ORDER — OXYCODONE-ACETAMINOPHEN 5-325 MG PO TABS: 1.0000 | ORAL_TABLET | ORAL | Status: DC | PRN

## 2012-03-02 MED ORDER — SODIUM CHLORIDE 0.9 % IV BOLUS (SEPSIS)
1000.0000 mL | Freq: Once | INTRAVENOUS | Status: AC
  Administered 2012-03-02: 1000 mL via INTRAVENOUS

## 2012-03-02 NOTE — ED Notes (Signed)
Family at bedside. 

## 2012-03-02 NOTE — ED Provider Notes (Signed)
History     CSN: [id number]  Arrival date & time 03/02/12  1312   First MD Initiated Contact with Patient 03/02/12 1332      Chief Complaint  Patient presents with  . Groin Pain  . Abdominal Pain    (Consider location/radiation/quality/duration/timing/severity/associated sxs/prior treatment) HPI Comments: Patient is a 71 year old male who presents with sudden onset of lower abdominal and groin pain two hours ago. Patient reports constant pain located in generalized lower abdomen and bilateral testicles, groin, and upper thighs. The pain is sharp and severe. No alleviating/aggravating factors. Patient has not tried anything for pain. Denies associated symptoms. Patient denies fever, NVD, chest pain, SOB, numbness/tingling, dysuria, hematuria, flank pain.    Past Medical History  Diagnosis Date  . Chronic back pain     History reviewed. No pertinent past surgical history.  History reviewed. No pertinent family history.  History  Substance Use Topics  . Smoking status: Never Smoker   . Smokeless tobacco: Not on file  . Alcohol Use: No      Review of Systems  Gastrointestinal: Positive for abdominal pain.  Genitourinary: Positive for testicular pain.       Groin pain  All other systems reviewed and are negative.    Allergies  Codeine  Home Medications   Current Outpatient Rx  Name Route Sig Dispense Refill  . ASPIRIN 81 MG PO TABS Oral Take 81 mg by mouth daily.    . ATORVASTATIN CALCIUM 40 MG PO TABS Oral Take 40 mg by mouth at bedtime.    Marland Kitchen METOPROLOL SUCCINATE ER 25 MG PO TB24 Oral Take 25 mg by mouth daily.    Marland Kitchen GLUCOSAMINE CHONDROITIN ADV PO Oral Take 1 tablet by mouth 2 (two) times daily.    Marland Kitchen FISH OIL 1200 MG PO CAPS Oral Take 1,200-2,400 mg by mouth 2 (two) times daily. Takes 1200 mg in the morning and 2400 mg in the evening    . OMEPRAZOLE PO Oral Take 1 tablet by mouth daily.    Marland Kitchen OVER THE COUNTER MEDICATION Oral Take 1 tablet by mouth daily. Fiber  choice probiotic chew tablet      BP 113/76  Pulse 81  Temp 97.1 F (36.2 C) (Oral)  Resp 24  Ht 5\' 11"  (1.803 m)  Wt 205 lb (92.987 kg)  BMI 28.59 kg/m2  SpO2 96%  Physical Exam  Nursing note and vitals reviewed. Constitutional: He is oriented to person, place, and time. He appears well-developed and well-nourished. No distress.  HENT:  Head: Normocephalic and atraumatic.  Eyes: Conjunctivae normal and EOM are normal. Pupils are equal, round, and reactive to light. No scleral icterus.  Neck: Normal range of motion. Neck supple.  Cardiovascular: Normal rate and regular rhythm.  Exam reveals no gallop and no friction rub.   No murmur heard. Pulmonary/Chest: Effort normal and breath sounds normal. He has no wheezes. He has no rales. He exhibits no tenderness.  Abdominal: Soft. He exhibits no distension. There is no tenderness. There is no rebound and no guarding.  Genitourinary: Penis normal. No penile tenderness.       No testicular mass or tenderness noted. No bulging mass from lower abdomen. No CVA tenderness.   Musculoskeletal: Normal range of motion.  Neurological: He is alert and oriented to person, place, and time. Coordination normal.       Speech is goal-oriented. Moves limbs without ataxia.   Skin: Skin is warm and dry. He is not diaphoretic.  Psychiatric:  He has a normal mood and affect. His behavior is normal.    ED Course  Procedures (including critical care time)  Labs Reviewed  URINALYSIS, ROUTINE W REFLEX MICROSCOPIC - Abnormal; Notable for the following:    Hgb urine dipstick TRACE (*)     Ketones, ur 15 (*)     All other components within normal limits  URINE MICROSCOPIC-ADD ON  URINE CULTURE   Ct Abdomen Pelvis Wo Contrast  03/02/2012  *RADIOLOGY REPORT*  Clinical Data: Severe left lower abdominal pain extending into the groin and back.  CT ABDOMEN AND PELVIS WITHOUT CONTRAST  Technique:  Multidetector CT imaging of the abdomen and pelvis was performed  following the standard protocol without intravenous contrast.  Comparison: Lumbar myelogram 10/15/1010.  Findings: The lung bases demonstrate mild atelectasis or scarring at the right lung base.  No focal nodule, mass, or airspace disease is present otherwise.  A pectus deformity is noted.  The heart size is normal.  No significant pleural or pericardial effusion is present.  The liver and spleen are within normal limits.  A portion of the colon is interposed anterior to the liver, a normal variant.  The stomach, duodenum, and pancreas are within normal limits.  The common bile duct and gallbladder are unremarkable.  The adrenal glands are normal bilaterally.  The kidneys and ureters are unremarkable.  There is no evidence for stone or hydronephrosis.  Atherosclerotic calcifications are present in the abdominal aorta at any without aneurysm.  The urinary bladder is within normal limits.  The rectosigmoid colon is within normal limits.  The remainder of the colon is unremarkable.  The appendix is visualized and normal. The small bowel is unremarkable.  No significant adenopathy or free fluid is present.  The patient is status post L4-5 PLIF.  Rightward curvature of lumbar spine is centered at L2-3.  No focal lytic or blastic lesions are present.  IMPRESSION:  1.  No acute or focal lesion to explain the patient's symptoms. 2.  Mild atelectasis or scarring at the right lung base. 3.  Atherosclerosis. 4.  Scoliosis of the lumbar spine with postoperative changes at L4- 5.   Original Report Authenticated By: Jamesetta Orleans. MATTERN, M.D.      No diagnosis found.    MDM  2:05 PM Urine pending. Patient will have morphine for pain.   2:27 PM Urine shows trace hgb. Patient will have CT abdomen pelvis without contrast to rule out kidney stone.   4:00 PM CT scan unremarkable. Patient states he has a history of Peyronie's disease.   5:03 PM Patient signed out to Johnnette Gourd, PA-C   [place],  [place] 03/02/12 1704

## 2012-03-02 NOTE — ED Notes (Signed)
Patient transported to Ultrasound 

## 2012-03-02 NOTE — ED Notes (Signed)
Pt c/o severe lower abd pain into groin and back x 2 hours

## 2012-03-03 LAB — URINE CULTURE

## 2012-03-05 NOTE — ED Provider Notes (Signed)
Medical screening examination/treatment/procedure(s) were conducted as a shared visit with non-physician practitioner(s) and myself.  I personally evaluated the patient during the encounter   Loren Racer, MD 03/05/12 [phone]

## 2012-09-14 ENCOUNTER — Encounter: Payer: Self-pay | Admitting: Family Medicine

## 2012-09-14 ENCOUNTER — Ambulatory Visit (INDEPENDENT_AMBULATORY_CARE_PROVIDER_SITE_OTHER): Payer: Medicare Other | Admitting: Family Medicine

## 2012-09-14 VITALS — BP 110/68 | HR 62 | Temp 98.3°F | Resp 16 | Wt 210.0 lb

## 2012-09-14 DIAGNOSIS — H60399 Other infective otitis externa, unspecified ear: Secondary | ICD-10-CM

## 2012-09-14 DIAGNOSIS — H60392 Other infective otitis externa, left ear: Secondary | ICD-10-CM

## 2012-09-14 MED ORDER — ATORVASTATIN CALCIUM 40 MG PO TABS: 40.0000 mg | ORAL_TABLET | Freq: Every day | ORAL | Status: DC

## 2012-09-14 MED ORDER — NEOMYCIN-POLYMYXIN-HC 3.5-10000-1 OT SOLN: 3.0000 [drp] | Freq: Four times a day (QID) | OTIC | Status: DC

## 2012-09-14 MED ORDER — METOPROLOL SUCCINATE ER 25 MG PO TB24: 25.0000 mg | ORAL_TABLET | Freq: Every day | ORAL | Status: DC

## 2012-09-14 NOTE — Progress Notes (Signed)
  Subjective:    Patient ID: Tyler Allison, male    DOB: 1940-05-11, 72 y.o.   MRN: [id number]  HPI  Patient reports 3 days of stopped up, painful left ear canal. It itches and throbs inside his ear. He denies any fever. He has been having increasing allergy symptoms over the last few days consisting of rhinitis and sneezing. He has some sinus pressure. He just started on Claritin 10 mg by mouth daily. Past Medical History  Diagnosis Date  . Chronic back pain    Current Outpatient Prescriptions on File Prior to Visit  Medication Sig Dispense Refill  . aspirin 81 MG tablet Take 81 mg by mouth daily.      . Misc Natural Products (GLUCOSAMINE CHONDROITIN ADV PO) Take 1 tablet by mouth 2 (two) times daily.      . Omega-3 Fatty Acids (FISH OIL) 1200 MG CAPS Take 1,200-2,400 mg by mouth 2 (two) times daily. Takes 1200 mg in the morning and 2400 mg in the evening      . oxyCODONE-acetaminophen (PERCOCET/ROXICET) 5-325 MG per tablet Take 1-2 tablets by mouth every 4 (four) hours as needed for pain.  20 tablet  0   No current facility-administered medications on file prior to visit.   Allergies  Allergen Reactions  . Codeine Nausea And Vomiting     Review of Systems  All other systems reviewed and are negative.       Objective:   Physical Exam  Constitutional: He appears well-developed and well-nourished.  HENT:  Right Ear: External ear normal.  Nose: Nose normal.  Mouth/Throat: Oropharynx is clear and moist.  Cardiovascular: Normal rate, regular rhythm and normal heart sounds.   Pulmonary/Chest: Effort normal and breath sounds normal.   left external auditory canal is filled with a purulent opaque discharge. The canal itself is erythematous and swollen        Assessment & Plan:  1. Otitis, externa, infective, left - neomycin-polymyxin-hydrocortisone (CORTISPORIN) otic solution; Place 3 drops into the left ear 4 (four) times daily.  Dispense: 10 mL; Refill: 0

## 2012-10-23 ENCOUNTER — Telehealth: Payer: Self-pay | Admitting: Family Medicine

## 2012-10-23 MED ORDER — OMEPRAZOLE 20 MG PO CPDR: 20.0000 mg | DELAYED_RELEASE_CAPSULE | Freq: Every day | ORAL | Status: DC

## 2012-10-23 NOTE — Telephone Encounter (Signed)
Rx Refilled  

## 2012-10-31 ENCOUNTER — Telehealth: Payer: Self-pay | Admitting: Family Medicine

## 2012-10-31 MED ORDER — ALBUTEROL SULFATE HFA 108 (90 BASE) MCG/ACT IN AERS: 2.0000 | INHALATION_SPRAY | RESPIRATORY_TRACT | Status: DC | PRN

## 2012-10-31 NOTE — Telephone Encounter (Signed)
Rx Refilled  

## 2012-11-02 ENCOUNTER — Encounter: Payer: Self-pay | Admitting: Family Medicine

## 2012-11-02 ENCOUNTER — Ambulatory Visit (INDEPENDENT_AMBULATORY_CARE_PROVIDER_SITE_OTHER): Payer: Medicare Other | Admitting: Family Medicine

## 2012-11-02 VITALS — BP 120/82 | HR 78 | Temp 97.8°F | Resp 20 | Ht 71.0 in | Wt 208.0 lb

## 2012-11-02 DIAGNOSIS — J45901 Unspecified asthma with (acute) exacerbation: Secondary | ICD-10-CM

## 2012-11-02 DIAGNOSIS — J4530 Mild persistent asthma, uncomplicated: Secondary | ICD-10-CM | POA: Insufficient documentation

## 2012-11-02 DIAGNOSIS — J4540 Moderate persistent asthma, uncomplicated: Secondary | ICD-10-CM | POA: Insufficient documentation

## 2012-11-02 DIAGNOSIS — J4531 Mild persistent asthma with (acute) exacerbation: Secondary | ICD-10-CM | POA: Insufficient documentation

## 2012-11-02 MED ORDER — METHYLPREDNISOLONE ACETATE 40 MG/ML IJ SUSP
40.0000 mg | Freq: Once | INTRAMUSCULAR | Status: AC
  Administered 2012-11-02: 40 mg via INTRAMUSCULAR

## 2012-11-02 MED ORDER — PREDNISONE 10 MG PO TABS: 20.0000 mg | ORAL_TABLET | Freq: Every day | ORAL | Status: DC

## 2012-11-02 MED ORDER — PREDNISONE 20 MG PO TABS: 40.0000 mg | ORAL_TABLET | Freq: Every day | ORAL | Status: DC

## 2012-11-02 MED ORDER — AZITHROMYCIN 250 MG PO TABS: ORAL_TABLET | ORAL | Status: DC

## 2012-11-02 NOTE — Patient Instructions (Addendum)
Prednisone as prescribed- start tomorrow Mucinex twice a day for next week Continue albuterol as needed Antibiotics given Call if you do not improve

## 2012-11-04 ENCOUNTER — Encounter: Payer: Self-pay | Admitting: Family Medicine

## 2012-11-04 NOTE — Progress Notes (Signed)
  Subjective:    Patient ID: Tyler Allison, male    DOB: 1940/07/01, 72 y.o.   MRN: [id number]  HPI  Pt here with cough, SOB, wheeze for past weeks. Known history of asthma has been getting progressively worse. Throat feels raw as well from cough. Has been taking claritin and using albuterol but now low on this. ALso used vapor rub and OTC cough medication.   Review of Systems - per above   GEN- denies fatigue, fever, weight loss,weakness, recent illness HEENT- denies eye drainage, change in vision, nasal discharge, CVS- denies chest pain, palpitations RESP- + SOB, +cough, +wheeze Neuro- denies headache, dizziness, syncope, seizure activity       Objective:   Physical Exam  GEN- NAD, alert and oriented x3 HEENT- PERRL, EOMI, non injected sclera, pink conjunctiva, MMM, oropharynxclear, no maxillary sinus tenderness, nares clear Neck- Supple, no LAD CVS- RRR, no murmur RESP bilateral wheeze,, decreased BS bases, no retractions, mild rhonchi, fair air movement EXT- No edema Pulses- Radial 2+  S/p neb- improved WOB, wheeze and air movement        Assessment & Plan:

## 2012-11-04 NOTE — Assessment & Plan Note (Signed)
Acute exacerbation worsening, likley URI initially starting symptoms S/P neb much improved Continue albuterol, IM steroids, then start oral  Z pak Mucinex Given red flags

## 2013-02-13 ENCOUNTER — Other Ambulatory Visit (INDEPENDENT_AMBULATORY_CARE_PROVIDER_SITE_OTHER): Payer: Medicare Other

## 2013-02-13 DIAGNOSIS — E782 Mixed hyperlipidemia: Secondary | ICD-10-CM

## 2013-02-13 DIAGNOSIS — Z125 Encounter for screening for malignant neoplasm of prostate: Secondary | ICD-10-CM

## 2013-02-13 DIAGNOSIS — Z23 Encounter for immunization: Secondary | ICD-10-CM

## 2013-02-13 DIAGNOSIS — Z79899 Other long term (current) drug therapy: Secondary | ICD-10-CM

## 2013-02-13 LAB — LIPID PANEL
LDL Cholesterol: 63 mg/dL (ref 0–99)
Triglycerides: 118 mg/dL (ref <150)
VLDL: 24 mg/dL (ref 0–40)

## 2013-02-13 LAB — CBC WITH DIFFERENTIAL/PLATELET
Basophils Absolute: 0.1 10*3/uL (ref 0.0–0.1)
HCT: 42 % (ref 39.0–52.0)
Lymphocytes Relative: 30 % (ref 12–46)
Monocytes Absolute: 0.8 10*3/uL (ref 0.1–1.0)
Neutro Abs: 4.4 10*3/uL (ref 1.7–7.7)
Neutrophils Relative %: 52 % (ref 43–77)
RDW: 13.1 % (ref 11.5–15.5)
WBC: 8.4 10*3/uL (ref 4.0–10.5)

## 2013-02-13 LAB — COMPREHENSIVE METABOLIC PANEL
ALT: 18 U/L (ref 0–53)
AST: 22 U/L (ref 0–37)
Albumin: 3.8 g/dL (ref 3.5–5.2)
Alkaline Phosphatase: 72 U/L (ref 39–117)
BUN: 19 mg/dL (ref 6–23)
Calcium: 9 mg/dL (ref 8.4–10.5)
Chloride: 103 mEq/L (ref 96–112)
Potassium: 4.8 mEq/L (ref 3.5–5.3)
Sodium: 139 mEq/L (ref 135–145)
Total Protein: 6.3 g/dL (ref 6.0–8.3)

## 2013-02-14 LAB — PSA, MEDICARE: PSA: 2.7 ng/mL (ref <=4.00)

## 2013-02-18 ENCOUNTER — Ambulatory Visit (INDEPENDENT_AMBULATORY_CARE_PROVIDER_SITE_OTHER): Payer: Medicare Other | Admitting: Family Medicine

## 2013-02-18 ENCOUNTER — Encounter: Payer: Self-pay | Admitting: Family Medicine

## 2013-02-18 VITALS — BP 110/68 | HR 58 | Temp 97.1°F | Resp 14 | Ht 70.5 in | Wt 216.0 lb

## 2013-02-18 DIAGNOSIS — B353 Tinea pedis: Secondary | ICD-10-CM

## 2013-02-18 DIAGNOSIS — Z9109 Other allergy status, other than to drugs and biological substances: Secondary | ICD-10-CM

## 2013-02-18 DIAGNOSIS — Z0000 Encounter for general adult medical examination without abnormal findings: Secondary | ICD-10-CM

## 2013-02-18 MED ORDER — CLOTRIMAZOLE 1 % EX CREA: TOPICAL_CREAM | Freq: Two times a day (BID) | CUTANEOUS | Status: DC

## 2013-02-18 MED ORDER — LEVOCETIRIZINE DIHYDROCHLORIDE 5 MG PO TABS: 5.0000 mg | ORAL_TABLET | Freq: Every evening | ORAL | Status: DC

## 2013-02-18 NOTE — Progress Notes (Signed)
Subjective:    Patient ID: Tyler Allison, male    DOB: 1941-04-30, 72 y.o.   MRN: [id number]  HPI Patient is here today for complete physical exam. He has a rash on his right foot. It is erythematous. Is on the plantar aspect and extends out to the medial aspect of the ankle. It has a serpiginous border with white scale and appears to be tinea pedis. He also has been using his albuterol 3 times a week due to mild wheezing. He has also been occasionally breaking out in hives. This occurs when he is outside working in his yard. He believes it could be his allergies.  After that he is doing well. He has no major concerns.  His last colonoscopy was in 2006 and is not due again until 2016. His tetanus shot was in 2006. The Zostavax was in 2008. He had Pneumovax in 2006. He is due for a flu shot today. He is due for a prostate exam today. His most recent labwork as listed below. Lab on 02/13/2013  Component Date Value Range Status  . WBC 02/13/2013 8.4  4.0 - 10.5 K/uL Final  . RBC 02/13/2013 4.57  4.22 - 5.81 MIL/uL Final  . Hemoglobin 02/13/2013 14.2  13.0 - 17.0 g/dL Final  . HCT 82/95/6213 42.0  39.0 - 52.0 % Final  . MCV 02/13/2013 91.9  78.0 - 100.0 fL Final  . MCH 02/13/2013 31.1  26.0 - 34.0 pg Final  . MCHC 02/13/2013 33.8  30.0 - 36.0 g/dL Final  . RDW 08/65/7846 13.1  11.5 - 15.5 % Final  . Platelets 02/13/2013 281  150 - 400 K/uL Final  . Neutrophils Relative % 02/13/2013 52  43 - 77 % Final  . Neutro Abs 02/13/2013 4.4  1.7 - 7.7 K/uL Final  . Lymphocytes Relative 02/13/2013 30  12 - 46 % Final  . Lymphs Abs 02/13/2013 2.5  0.7 - 4.0 K/uL Final  . Monocytes Relative 02/13/2013 10  3 - 12 % Final  . Monocytes Absolute 02/13/2013 0.8  0.1 - 1.0 K/uL Final  . Eosinophils Relative 02/13/2013 7* 0 - 5 % Final  . Eosinophils Absolute 02/13/2013 0.6  0.0 - 0.7 K/uL Final  . Basophils Relative 02/13/2013 1  0 - 1 % Final  . Basophils Absolute 02/13/2013 0.1  0.0 - 0.1 K/uL Final  . Smear  Review 02/13/2013 Criteria for review not met   Final  . Sodium 02/13/2013 139  135 - 145 mEq/L Final  . Potassium 02/13/2013 4.8  3.5 - 5.3 mEq/L Final  . Chloride 02/13/2013 103  96 - 112 mEq/L Final  . CO2 02/13/2013 29  19 - 32 mEq/L Final  . Glucose, Bld 02/13/2013 104* 70 - 99 mg/dL Final  . BUN 96/29/5284 19  6 - 23 mg/dL Final  . Creat 13/24/4010 0.90  0.50 - 1.35 mg/dL Final  . Total Bilirubin 02/13/2013 0.6  0.3 - 1.2 mg/dL Final  . Alkaline Phosphatase 02/13/2013 72  39 - 117 U/L Final  . AST 02/13/2013 22  0 - 37 U/L Final  . ALT 02/13/2013 18  0 - 53 U/L Final  . Total Protein 02/13/2013 6.3  6.0 - 8.3 g/dL Final  . Albumin 27/25/3664 3.8  3.5 - 5.2 g/dL Final  . Calcium 40/34/7425 9.0  8.4 - 10.5 mg/dL Final  . Cholesterol 95/63/8756 132  0 - 200 mg/dL Final   Comment: ATP III Classification:                                <  200        mg/dL        Desirable                               200 - 239     mg/dL        Borderline High                               >= 240        mg/dL        High                             . Triglycerides 02/13/2013 118  <150 mg/dL Final  . HDL 16/02/9603 45  >39 mg/dL Final  . Total CHOL/HDL Ratio 02/13/2013 2.9   Final  . VLDL 02/13/2013 24  0 - 40 mg/dL Final  . LDL Cholesterol 02/13/2013 63  0 - 99 mg/dL Final   Comment:                            Total Cholesterol/HDL Ratio:CHD Risk                                                 Coronary Heart Disease Risk Table                                                                 Men       Women                                   1/2 Average Risk              3.4        3.3                                       Average Risk              5.0        4.4                                    2X Average Risk              9.6        7.1                                    3X Average Risk             23.4       11.0  Use the calculated Patient Ratio above and the CHD Risk  table                           to determine the patient's CHD Risk.                          ATP III Classification (LDL):                                < 100        mg/dL         Optimal                               100 - 129     mg/dL         Near or Above Optimal                               130 - 159     mg/dL         Borderline High                               160 - 189     mg/dL         High                                > 190        mg/dL         Very High                             . PSA 02/13/2013 2.70  <=4.00 ng/mL Final   Comment: Test Methodology: ECLIA PSA (Electrochemiluminescence Immunoassay)                                                     For PSA values from 2.5-4.0, particularly in younger men <60 years                          old, the AUA and NCCN suggest testing for % Free PSA (3515) and                          evaluation of the rate of increase in PSA (PSA velocity).   Past Medical History  Diagnosis Date  . Chronic back pain   . Allergy   . Asthma   . GERD (gastroesophageal reflux disease)   . Hypertension   . Hyperlipidemia    Current Outpatient Prescriptions on File Prior to Visit  Medication Sig Dispense Refill  . albuterol (PROAIR HFA) 108 (90 BASE) MCG/ACT inhaler Inhale 2 puffs into the lungs every 4 (four) hours as needed.  8.5 g  3  . aspirin 81 MG tablet Take 81 mg by mouth. 2 - 3 times per week      .  atorvastatin (LIPITOR) 40 MG tablet Take 1 tablet (40 mg total) by mouth at bedtime.  90 tablet  3  . metoprolol succinate (TOPROL-XL) 25 MG 24 hr tablet Take 1 tablet (25 mg total) by mouth daily.  90 tablet  3  . Misc Natural Products (GLUCOSAMINE CHONDROITIN ADV PO) Take 1 tablet by mouth 2 (two) times daily.      . Omega-3 Fatty Acids (FISH OIL) 1200 MG CAPS Take 1,200-2,400 mg by mouth 2 (two) times daily. Takes 1200 mg in the morning and 2400 mg in the evening      . omeprazole (PRILOSEC) 20 MG capsule Take 1 capsule (20 mg total) by  mouth daily.  90 capsule  4  . oxyCODONE-acetaminophen (PERCOCET/ROXICET) 5-325 MG per tablet Take 1-2 tablets by mouth every 4 (four) hours as needed for pain.  20 tablet  0   No current facility-administered medications on file prior to visit.   Allergies  Allergen Reactions  . Codeine Nausea And Vomiting   No past surgical history on file. History   Social History  . Marital Status: Married    Spouse Name: N/A    Number of Children: N/A  . Years of Education: N/A   Occupational History  . Not on file.   Social History Main Topics  . Smoking status: Never Smoker   . Smokeless tobacco: Never Used  . Alcohol Use: No  . Drug Use: No  . Sexual Activity: Not on file     Comment: married, retired.   Other Topics Concern  . Not on file   Social History Narrative  . No narrative on file   No family history on file. His parents are deceased and his family medical history is noncontributory.   Review of Systems  All other systems reviewed and are negative.       Objective:   Physical Exam  Vitals reviewed. Constitutional: He is oriented to person, place, and time. He appears well-developed and well-nourished. No distress.  HENT:  Head: Normocephalic and atraumatic.  Right Ear: External ear normal.  Left Ear: External ear normal.  Nose: Nose normal.  Mouth/Throat: Oropharynx is clear and moist. No oropharyngeal exudate.  Eyes: Conjunctivae and EOM are normal. Pupils are equal, round, and reactive to light. Right eye exhibits no discharge. Left eye exhibits no discharge. No scleral icterus.  Neck: Normal range of motion. Neck supple. No JVD present. No tracheal deviation present. No thyromegaly present.  Cardiovascular: Normal rate, regular rhythm, normal heart sounds and intact distal pulses.  Exam reveals no gallop and no friction rub.   No murmur heard. Pulmonary/Chest: Effort normal and breath sounds normal. No stridor. No respiratory distress. He has no wheezes.  He has no rales. He exhibits no tenderness.  Abdominal: Soft. Bowel sounds are normal. He exhibits no distension and no mass. There is no tenderness. There is no rebound and no guarding.  Genitourinary: Rectum normal, prostate normal and penis normal. Guaiac negative stool. No penile tenderness.  Musculoskeletal: Normal range of motion. He exhibits no edema and no tenderness.  Lymphadenopathy:    He has no cervical adenopathy.  Neurological: He is alert and oriented to person, place, and time. He has normal reflexes. He displays normal reflexes. No cranial nerve deficit. He exhibits normal muscle tone. Coordination normal.  Skin: Skin is warm. Rash noted. He is not diaphoretic. No erythema. No pallor.  Psychiatric: He has a normal mood and affect. His behavior is normal. Judgment and thought content  normal.          Assessment & Plan:  1. Environmental allergies Believe his allergies are triggering his asthma and possibly causing the urticaria.  Start xyzal 5 mg by mouth daily and recheck in 2 weeks. - levocetirizine (XYZAL) 5 MG tablet; Take 1 tablet (5 mg total) by mouth every evening.  Dispense: 30 tablet; Refill: 1  2. Tinea pedis  - clotrimazole (LOTRIMIN) 1 % cream; Apply topically 2 (two) times daily.  Dispense: 45 g; Refill: 0  3. Routine general medical examination at a health care facility Prostate exam is normal. I reviewed the lab the patient was within normal limits. I recommended decreasing carbs in his diet and getting more regular aerobic exercise. Otherwise his physical exam is completely normal. His labs are normal. His immunizations are up-to-date. His cancer prevention is up to date. Recheck in one year or as needed

## 2013-03-22 ENCOUNTER — Telehealth: Payer: Self-pay | Admitting: *Deleted

## 2013-03-22 NOTE — Telephone Encounter (Signed)
?   OK to Refill  

## 2013-03-26 MED ORDER — NAPROXEN 500 MG PO TABS: 500.0000 mg | ORAL_TABLET | Freq: Every day | ORAL | Status: DC

## 2013-03-26 NOTE — Telephone Encounter (Signed)
ok 

## 2013-03-26 NOTE — Telephone Encounter (Signed)
Rx Refilled  

## 2013-04-20 ENCOUNTER — Other Ambulatory Visit: Payer: Self-pay | Admitting: Family Medicine

## 2013-09-18 ENCOUNTER — Telehealth: Payer: Self-pay | Admitting: Family Medicine

## 2013-09-18 MED ORDER — ATORVASTATIN CALCIUM 40 MG PO TABS: 40.0000 mg | ORAL_TABLET | Freq: Every day | ORAL | Status: DC

## 2013-09-18 MED ORDER — METOPROLOL SUCCINATE ER 25 MG PO TB24: 25.0000 mg | ORAL_TABLET | Freq: Every day | ORAL | Status: DC

## 2013-09-18 NOTE — Telephone Encounter (Signed)
Rx Refilled  

## 2013-11-16 ENCOUNTER — Other Ambulatory Visit: Payer: Self-pay | Admitting: Family Medicine

## 2013-11-16 MED ORDER — LEVOCETIRIZINE DIHYDROCHLORIDE 5 MG PO TABS: ORAL_TABLET | ORAL | Status: DC

## 2013-11-16 NOTE — Telephone Encounter (Signed)
Med sent for 90 day supply 

## 2013-11-25 ENCOUNTER — Ambulatory Visit (INDEPENDENT_AMBULATORY_CARE_PROVIDER_SITE_OTHER): Payer: Medicare Other | Admitting: Physician Assistant

## 2013-11-25 ENCOUNTER — Encounter: Payer: Self-pay | Admitting: Physician Assistant

## 2013-11-25 VITALS — BP 128/70 | HR 60 | Temp 98.3°F | Resp 18 | Wt 200.0 lb

## 2013-11-25 DIAGNOSIS — J988 Other specified respiratory disorders: Secondary | ICD-10-CM

## 2013-11-25 DIAGNOSIS — J45909 Unspecified asthma, uncomplicated: Secondary | ICD-10-CM

## 2013-11-25 DIAGNOSIS — J4521 Mild intermittent asthma with (acute) exacerbation: Secondary | ICD-10-CM

## 2013-11-25 DIAGNOSIS — J45901 Unspecified asthma with (acute) exacerbation: Secondary | ICD-10-CM

## 2013-11-25 DIAGNOSIS — B9689 Other specified bacterial agents as the cause of diseases classified elsewhere: Principal | ICD-10-CM

## 2013-11-25 DIAGNOSIS — A499 Bacterial infection, unspecified: Secondary | ICD-10-CM

## 2013-11-25 MED ORDER — PREDNISONE 20 MG PO TABS: ORAL_TABLET | ORAL | Status: DC

## 2013-11-25 MED ORDER — AZITHROMYCIN 250 MG PO TABS: ORAL_TABLET | ORAL | Status: DC

## 2013-11-25 NOTE — Progress Notes (Signed)
Patient ID: Tyler Allison MRN: [id number], DOB: Dec 30, 1940, 73 y.o. Date of Encounter: 11/25/2013, 12:20 PM    Chief Complaint:  Chief Complaint  Patient presents with  . head/chest congestion x 1 week    OTC's not helping, constant headache, no energy     HPI: 73 y.o. year old white male says he's been sick for a week. Says that he is coughing up a lot of phlegm. Says he is coughing up more phlegm than he is blowing mucus from his nose. He says he only has used albuterol 2 or 3 times over the past week. says he tries not to use that unless he absolutely has to. Has had no fevers or chills. No sore throat and no ear ache.     Home Meds:   Outpatient Prescriptions Prior to Visit  Medication Sig Dispense Refill  . albuterol (PROAIR HFA) 108 (90 BASE) MCG/ACT inhaler Inhale 2 puffs into the lungs every 4 (four) hours as needed.  8.5 g  3  . aspirin 81 MG tablet Take 81 mg by mouth. 2 - 3 times per week      . atorvastatin (LIPITOR) 40 MG tablet Take 1 tablet (40 mg total) by mouth at bedtime.  90 tablet  3  . levocetirizine (XYZAL) 5 MG tablet TAKE 1 TABLET (5 MG TOTAL) BY MOUTH EVERY EVENING.  90 tablet  3  . metoprolol succinate (TOPROL-XL) 25 MG 24 hr tablet Take 1 tablet (25 mg total) by mouth daily.  90 tablet  3  . Misc Natural Products (GLUCOSAMINE CHONDROITIN ADV PO) Take 1 tablet by mouth 2 (two) times daily.      . Multiple Vitamins-Minerals (OCUVITE PRESERVISION PO) Take by mouth.      . naproxen (NAPROSYN) 500 MG tablet Take 1 tablet (500 mg total) by mouth daily.  90 tablet  3  . Omega-3 Fatty Acids (FISH OIL) 1200 MG CAPS Take 1,200-2,400 mg by mouth 2 (two) times daily. Takes 1200 mg in the morning and 2400 mg in the evening      . omeprazole (PRILOSEC) 20 MG capsule Take 1 capsule (20 mg total) by mouth daily.  90 capsule  4  . oxyCODONE-acetaminophen (PERCOCET/ROXICET) 5-325 MG per tablet Take 1-2 tablets by mouth every 4 (four) hours as needed for pain.  20 tablet   0  . clotrimazole (LOTRIMIN) 1 % cream Apply topically 2 (two) times daily.  45 g  0   No facility-administered medications prior to visit.    Allergies:  Allergies  Allergen Reactions  . Codeine Nausea And Vomiting      Review of Systems: See HPI for pertinent ROS. All other ROS negative.    Physical Exam: Blood pressure 128/70, pulse 60, temperature 98.3 F (36.8 C), temperature source Oral, resp. rate 18, weight 200 lb (90.719 kg)., Body mass index is 28.28 kg/(m^2). General: WNWD WM. Pleasant.  Appears in no acute distress. HEENT: Normocephalic, atraumatic, eyes without discharge, sclera non-icteric, nares are without discharge. Bilateral auditory canals clear, TM's are without perforation, pearly grey and translucent with reflective cone of light bilaterally. Oral cavity moist, posterior pharynx without exudate, erythema, peritonsillar abscess. No tenderness with percussion of frontal or maxillary sinuses bilaterally.  Neck: Supple. No thyromegaly. No lymphadenopathy. Lungs: He has slight wheeze with expiration throughout bilaterally.Good air movement. Does not sound tight. Heart: Regular rhythm. No murmurs, rubs, or gallops. Msk:  Strength and tone normal for age. Extremities/Skin: Warm and dry.  Neuro: Alert  and oriented X 3. Moves all extremities spontaneously. Gait is normal. CNII-XII grossly in tact. Psych:  Responds to questions appropriately with a normal affect.     ASSESSMENT AND PLAN:  73 y.o. year old male with  1. Bacterial respiratory infection - azithromycin (ZITHROMAX) 250 MG tablet; Day 1: Take 2 daily.  Days 2-5: Take 1 daily.  Dispense: 6 tablet; Refill: 0  2. Asthma with acute exacerbation, mild intermittent - predniSONE (DELTASONE) 20 MG tablet; Take 2 daily for 5 days.  Dispense: 10 tablet; Refill: 0  3. ASTHMA  He is also using over-the-counter Mucinex. Recommended that he continue Mucinex as an expectorant. Complete the antibiotic as prescribed.  Take the prednisone as directed. Also told him to go ahead and use the albuterol every 4 hours if he is having wheezing. Follow up if symptoms worsen or if symptoms do not resolve after completion of antibiotic and prednisone.  [address] [place], [place], [facility] 11/25/2013 12:20 PM

## 2013-12-04 ENCOUNTER — Other Ambulatory Visit: Payer: Self-pay | Admitting: Family Medicine

## 2013-12-04 MED ORDER — OMEPRAZOLE 20 MG PO CPDR: 20.0000 mg | DELAYED_RELEASE_CAPSULE | Freq: Every day | ORAL | Status: DC

## 2013-12-04 NOTE — Telephone Encounter (Signed)
Med sent to pharm 

## 2014-02-25 ENCOUNTER — Ambulatory Visit (INDEPENDENT_AMBULATORY_CARE_PROVIDER_SITE_OTHER): Payer: Medicare Other | Admitting: Family Medicine

## 2014-02-25 DIAGNOSIS — J45909 Unspecified asthma, uncomplicated: Secondary | ICD-10-CM

## 2014-02-25 DIAGNOSIS — G8929 Other chronic pain: Secondary | ICD-10-CM

## 2014-02-25 DIAGNOSIS — Z23 Encounter for immunization: Secondary | ICD-10-CM

## 2014-02-25 DIAGNOSIS — E7800 Pure hypercholesterolemia, unspecified: Secondary | ICD-10-CM

## 2014-02-25 DIAGNOSIS — Z0000 Encounter for general adult medical examination without abnormal findings: Secondary | ICD-10-CM

## 2014-02-25 DIAGNOSIS — Z79899 Other long term (current) drug therapy: Secondary | ICD-10-CM

## 2014-03-11 ENCOUNTER — Other Ambulatory Visit: Payer: Medicare Other

## 2014-03-11 DIAGNOSIS — Z79899 Other long term (current) drug therapy: Secondary | ICD-10-CM

## 2014-03-11 DIAGNOSIS — J45909 Unspecified asthma, uncomplicated: Secondary | ICD-10-CM

## 2014-03-11 DIAGNOSIS — E7800 Pure hypercholesterolemia, unspecified: Secondary | ICD-10-CM

## 2014-03-11 DIAGNOSIS — Z23 Encounter for immunization: Secondary | ICD-10-CM

## 2014-03-11 DIAGNOSIS — Z0000 Encounter for general adult medical examination without abnormal findings: Secondary | ICD-10-CM

## 2014-03-11 DIAGNOSIS — G8929 Other chronic pain: Secondary | ICD-10-CM

## 2014-03-11 LAB — LIPID PANEL
Cholesterol: 141 mg/dL (ref 0–200)
HDL: 46 mg/dL (ref >39)
LDL Cholesterol: 70 mg/dL (ref 0–99)
Total CHOL/HDL Ratio: 3.1 Ratio
Triglycerides: 124 mg/dL (ref <150)
VLDL: 25 mg/dL (ref 0–40)

## 2014-03-11 LAB — CBC WITH DIFFERENTIAL/PLATELET
Basophils Absolute: 0.1 10*3/uL (ref 0.0–0.1)
Basophils Relative: 1 % (ref 0–1)
EOS ABS: 0.8 10*3/uL — AB (ref 0.0–0.7)
Eosinophils Relative: 9 % — ABNORMAL HIGH (ref 0–5)
HCT: 41 % (ref 39.0–52.0)
Hemoglobin: 14.2 g/dL (ref 13.0–17.0)
Lymphocytes Relative: 26 % (ref 12–46)
Lymphs Abs: 2.4 10*3/uL (ref 0.7–4.0)
MCH: 30.9 pg (ref 26.0–34.0)
MCHC: 34.6 g/dL (ref 30.0–36.0)
MCV: 89.3 fL (ref 78.0–100.0)
Monocytes Absolute: 1.2 10*3/uL — ABNORMAL HIGH (ref 0.1–1.0)
Monocytes Relative: 13 % — ABNORMAL HIGH (ref 3–12)
Neutro Abs: 4.7 10*3/uL (ref 1.7–7.7)
Neutrophils Relative %: 51 % (ref 43–77)
PLATELETS: 266 10*3/uL (ref 150–400)
RBC: 4.59 MIL/uL (ref 4.22–5.81)
RDW: 13.2 % (ref 11.5–15.5)
WBC: 9.2 10*3/uL (ref 4.0–10.5)

## 2014-03-11 LAB — COMPLETE METABOLIC PANEL WITH GFR
ALBUMIN: 4 g/dL (ref 3.5–5.2)
ALT: 16 U/L (ref 0–53)
AST: 21 U/L (ref 0–37)
Alkaline Phosphatase: 94 U/L (ref 39–117)
BUN: 14 mg/dL (ref 6–23)
CHLORIDE: 101 meq/L (ref 96–112)
CO2: 29 mEq/L (ref 19–32)
CREATININE: 1.03 mg/dL (ref 0.50–1.35)
Calcium: 8.6 mg/dL (ref 8.4–10.5)
GFR, Est African American: 82 mL/min
GFR, Est Non African American: 71 mL/min
Glucose, Bld: 95 mg/dL (ref 70–99)
POTASSIUM: 4.7 meq/L (ref 3.5–5.3)
Sodium: 137 mEq/L (ref 135–145)
Total Bilirubin: 0.6 mg/dL (ref 0.2–1.2)
Total Protein: 6.7 g/dL (ref 6.0–8.3)

## 2014-03-11 LAB — TSH: TSH: 4.301 u[IU]/mL (ref 0.350–4.500)

## 2014-03-12 LAB — PSA, MEDICARE: PSA: 4.34 ng/mL — AB (ref <=4.00)

## 2014-03-14 ENCOUNTER — Ambulatory Visit (INDEPENDENT_AMBULATORY_CARE_PROVIDER_SITE_OTHER): Payer: Medicare Other | Admitting: Family Medicine

## 2014-03-14 ENCOUNTER — Encounter: Payer: Self-pay | Admitting: Family Medicine

## 2014-03-14 VITALS — BP 104/62 | HR 74 | Temp 97.9°F | Resp 20 | Ht 70.5 in | Wt 210.0 lb

## 2014-03-14 DIAGNOSIS — Z0000 Encounter for general adult medical examination without abnormal findings: Secondary | ICD-10-CM | POA: Diagnosis not present

## 2014-03-14 DIAGNOSIS — Z23 Encounter for immunization: Secondary | ICD-10-CM | POA: Diagnosis not present

## 2014-03-14 MED ORDER — CIPROFLOXACIN HCL 500 MG PO TABS: 500.0000 mg | ORAL_TABLET | Freq: Two times a day (BID) | ORAL | Status: DC

## 2014-03-14 NOTE — Addendum Note (Signed)
Addended by: Shary Decamp B on: 03/14/2014 10:20 AM   Modules accepted: Orders

## 2014-03-14 NOTE — Progress Notes (Signed)
Subjective:    Patient ID: Tyler Allison, male    DOB: Jan 05, 1941, 73 y.o.   MRN: [id number]  HPI Subjective:   Patient presents for Medicare Annual/Subsequent preventive examination. Patient's PSA has doubled in the last year. He denies any symptoms of a prostate infection. He denies any hematuria or dysuria. He does report a weak urinary stream. On prostate exam he has a +1 prostate with no nodularity and no tenderness. He has had more trouble with his asthma recently. He reports heavy use his albuterol almost on a daily basis. He associates some of this with the meloxicam. He is also complaining of severe acid reflux uncontrolled with the omeprazole.  Review Past Medical/Family/Social: Past Medical History  Diagnosis Date  . Chronic back pain   . Allergy   . Asthma   . GERD (gastroesophageal reflux disease)   . Hypertension   . Hyperlipidemia    No past surgical history on file. Current Outpatient Prescriptions on File Prior to Visit  Medication Sig Dispense Refill  . albuterol (PROAIR HFA) 108 (90 BASE) MCG/ACT inhaler Inhale 2 puffs into the lungs every 4 (four) hours as needed. 8.5 g 3  . aspirin 81 MG tablet Take 81 mg by mouth. 2 - 3 times per week    . atorvastatin (LIPITOR) 40 MG tablet Take 1 tablet (40 mg total) by mouth at bedtime. 90 tablet 3  . azithromycin (ZITHROMAX) 250 MG tablet Day 1: Take 2 daily.  Days 2-5: Take 1 daily. 6 tablet 0  . levocetirizine (XYZAL) 5 MG tablet TAKE 1 TABLET (5 MG TOTAL) BY MOUTH EVERY EVENING. 90 tablet 3  . metoprolol succinate (TOPROL-XL) 25 MG 24 hr tablet Take 1 tablet (25 mg total) by mouth daily. 90 tablet 3  . Misc Natural Products (GLUCOSAMINE CHONDROITIN ADV PO) Take 1 tablet by mouth 2 (two) times daily.    . Multiple Vitamins-Minerals (OCUVITE PRESERVISION PO) Take by mouth.    . Omega-3 Fatty Acids (FISH OIL) 1200 MG CAPS Take 1,200-2,400 mg by mouth 2 (two) times daily. Takes 1200 mg in the morning and 2400 mg in the evening     . omeprazole (PRILOSEC) 20 MG capsule Take 1 capsule (20 mg total) by mouth daily. 90 capsule 4   No current facility-administered medications on file prior to visit.   Allergies  Allergen Reactions  . Codeine Nausea And Vomiting   History   Social History  . Marital Status: Married    Spouse Name: N/A    Number of Children: N/A  . Years of Education: N/A   Occupational History  . Not on file.   Social History Main Topics  . Smoking status: Never Smoker   . Smokeless tobacco: Never Used  . Alcohol Use: No  . Drug Use: No  . Sexual Activity: Not on file     Comment: married, retired.   Other Topics Concern  . Not on file   Social History Narrative  . No narrative on file   No family history on file.  Depression Screen  (Note: if answer to either of the following is "Yes", a more complete depression screening is indicated)  Over the past two weeks, have you felt down, depressed or hopeless? No Over the past two weeks, have you felt little interest or pleasure in doing things? No Have you lost interest or pleasure in daily life? No Do you often feel hopeless? No Do you cry easily over simple problems? No  Activities of Daily Living  In your present state of health, do you have any difficulty performing the following activities?:  Driving? No  Managing money? No  Feeding yourself? No  Getting from bed to chair? No  Climbing a flight of stairs? No  Preparing food and eating?: No  Bathing or showering? No  Getting dressed: No  Getting to the toilet? No  Using the toilet:No  Moving around from place to place: No  In the past year have you fallen or had a near fall?:No  Are you sexually active? No  Do you have more than one partner? No   Hearing Difficulties: No  Do you often ask people to speak up or repeat themselves? No  Do you experience ringing or noises in your ears? No Do you have difficulty understanding soft or whispered voices? No  Do you feel  that you have a problem with memory? No Do you often misplace items? No  Do you feel safe at home? Yes  Cognitive Testing  Alert? Yes Normal Appearance?Yes  Oriented to person? Yes Place? Yes  Time? Yes  Recall of three objects? Yes  Can perform simple calculations? Yes  Displays appropriate judgment?Yes  Can read the correct time from a watch face?Yes   Screening Tests / Date Colonoscopy      2006               Zostavax utd Pneumovax utd prevnar due Influenza Vaccine 2015 Tetanus/tdap 2006  Review of Systems  All other systems reviewed and are negative.      Objective:   Physical Exam  Constitutional: He is oriented to person, place, and time. He appears well-developed and well-nourished. No distress.  HENT:  Head: Normocephalic and atraumatic.  Right Ear: External ear normal.  Left Ear: External ear normal.  Nose: Nose normal.  Mouth/Throat: Oropharynx is clear and moist. No oropharyngeal exudate.  Eyes: Conjunctivae and EOM are normal. Pupils are equal, round, and reactive to light. Right eye exhibits no discharge. Left eye exhibits no discharge. No scleral icterus.  Neck: Normal range of motion. Neck supple. No JVD present. No tracheal deviation present. No thyromegaly present.  Cardiovascular: Normal rate, regular rhythm, normal heart sounds and intact distal pulses.  Exam reveals no gallop and no friction rub.   No murmur heard. Pulmonary/Chest: Effort normal and breath sounds normal. No stridor. No respiratory distress. He has no wheezes. He has no rales. He exhibits no tenderness.  Abdominal: Soft. Bowel sounds are normal. He exhibits no distension and no mass. There is no tenderness. There is no rebound and no guarding.  Genitourinary: Rectum normal and prostate normal.  Musculoskeletal: Normal range of motion. He exhibits no edema or tenderness.  Lymphadenopathy:    He has no cervical adenopathy.  Neurological: He is alert and oriented to person, place, and  time. He has normal reflexes. He displays normal reflexes. No cranial nerve deficit. He exhibits normal muscle tone. Coordination normal.  Skin: Skin is warm. No rash noted. He is not diaphoretic. No erythema. No pallor.  Psychiatric: He has a normal mood and affect. His behavior is normal. Judgment and thought content normal.  Vitals reviewed.  Appointment on 03/11/2014  Component Date Value Ref Range Status  . WBC 03/11/2014 9.2  4.0 - 10.5 K/uL Final  . RBC 03/11/2014 4.59  4.22 - 5.81 MIL/uL Final  . Hemoglobin 03/11/2014 14.2  13.0 - 17.0 g/dL Final  . HCT 03/11/2014 41.0  39.0 - 52.0 %  Final  . MCV 03/11/2014 89.3  78.0 - 100.0 fL Final  . MCH 03/11/2014 30.9  26.0 - 34.0 pg Final  . MCHC 03/11/2014 34.6  30.0 - 36.0 g/dL Final  . RDW 03/11/2014 13.2  11.5 - 15.5 % Final  . Platelets 03/11/2014 266  150 - 400 K/uL Final  . Neutrophils Relative % 03/11/2014 51  43 - 77 % Final  . Neutro Abs 03/11/2014 4.7  1.7 - 7.7 K/uL Final  . Lymphocytes Relative 03/11/2014 26  12 - 46 % Final  . Lymphs Abs 03/11/2014 2.4  0.7 - 4.0 K/uL Final  . Monocytes Relative 03/11/2014 13* 3 - 12 % Final  . Monocytes Absolute 03/11/2014 1.2* 0.1 - 1.0 K/uL Final  . Eosinophils Relative 03/11/2014 9* 0 - 5 % Final  . Eosinophils Absolute 03/11/2014 0.8* 0.0 - 0.7 K/uL Final  . Basophils Relative 03/11/2014 1  0 - 1 % Final  . Basophils Absolute 03/11/2014 0.1  0.0 - 0.1 K/uL Final  . Smear Review 03/11/2014 Criteria for review not met   Final  . Cholesterol 03/11/2014 141  0 - 200 mg/dL Final   Comment: ATP III Classification:       < 200        mg/dL        Desirable      200 - 239     mg/dL        Borderline High      >= 240        mg/dL        High     . Triglycerides 03/11/2014 124  <150 mg/dL Final  . HDL 03/11/2014 46  >39 mg/dL Final  . Total CHOL/HDL Ratio 03/11/2014 3.1   Final  . VLDL 03/11/2014 25  0 - 40 mg/dL Final  . LDL Cholesterol 03/11/2014 70  0 - 99 mg/dL Final   Comment:     Total Cholesterol/HDL Ratio:CHD Risk                        Coronary Heart Disease Risk Table                                        Men       Women          1/2 Average Risk              3.4        3.3              Average Risk              5.0        4.4           2X Average Risk              9.6        7.1           3X Average Risk             23.4       11.0 Use the calculated Patient Ratio above and the CHD Risk table  to determine the patient's CHD Risk. ATP III Classification (LDL):       < 100        mg/dL         Optimal  100 - 129     mg/dL         Near or Above Optimal      130 - 159     mg/dL         Borderline High      160 - 189     mg/dL         High       > 190        mg/dL         Very High     . PSA 03/11/2014 4.34* <=4.00 ng/mL Final   Comment: Result repeated and verified. Test Methodology: ECLIA PSA (Electrochemiluminescence Immunoassay)   For PSA values from 2.5-4.0, particularly in younger men <34 years old, the AUA and NCCN suggest testing for % Free PSA (3515) and evaluation of the rate of increase in PSA (PSA velocity).   . TSH 03/11/2014 4.301  0.350 - 4.500 uIU/mL Final  . Sodium 03/11/2014 137  135 - 145 mEq/L Final  . Potassium 03/11/2014 4.7  3.5 - 5.3 mEq/L Final  . Chloride 03/11/2014 101  96 - 112 mEq/L Final  . CO2 03/11/2014 29  19 - 32 mEq/L Final  . Glucose, Bld 03/11/2014 95  70 - 99 mg/dL Final  . BUN 03/11/2014 14  6 - 23 mg/dL Final  . Creat 03/11/2014 1.03  0.50 - 1.35 mg/dL Final  . Total Bilirubin 03/11/2014 0.6  0.2 - 1.2 mg/dL Final  . Alkaline Phosphatase 03/11/2014 94  39 - 117 U/L Final  . AST 03/11/2014 21  0 - 37 U/L Final  . ALT 03/11/2014 16  0 - 53 U/L Final  . Total Protein 03/11/2014 6.7  6.0 - 8.3 g/dL Final  . Albumin 03/11/2014 4.0  3.5 - 5.2 g/dL Final  . Calcium 03/11/2014 8.6  8.4 - 10.5 mg/dL Final  . GFR, Est African American 03/11/2014 82   Final  . GFR, Est Non African American 03/11/2014 71   Final    Comment:   The estimated GFR is a calculation valid for adults (>=86 years old) that uses the CKD-EPI algorithm to adjust for age and sex. It is   not to be used for children, pregnant women, hospitalized patients,    patients on dialysis, or with rapidly changing kidney function. According to the NKDEP, eGFR >89 is normal, 60-89 shows mild impairment, 30-59 shows moderate impairment, 15-29 shows severe impairment and <15 is ESRD.             Assessment & Plan:  Routine general medical examination at a health care facility patient received Prevnar 13 today in the office. The remainder of his immunizations are up-to-date. His cancer screening is up-to-date. I will start the patient on Cipro 500 mg by mouth twice a day for 14 days. I would like to see him back in 14 days recheck a PSA and recheck his prostate. Hopefully this is a subclinical prostatitis causing the elevated rise in his PSA and if still elevated, I would recommend a urology consultation for biopsy. Also recommended the patient temporarily discontinue the meloxicam and omeprazole. I will start the patient on dexilant 60 poqday.  If patient's breathing is not improving in 2 weeks, I would recommend starting the patient on a preventative medication for his asthma such as Symbicort. Medicare Attestation  I have personally reviewed:  The patient's medical and social history  Their use of alcohol, tobacco or illicit drugs  Their current medications and  supplements  The patient's functional ability including ADLs,fall risks, home safety risks, cognitive, and hearing and visual impairment  Diet and physical activities  Evidence for depression or mood disorders  The patient's weight, height, BMI, and visual acuity have been recorded in the chart. I have made referrals, counseling, and provided education to the patient based on review of the above and I have provided the patient with a written personalized care plan for preventive  services.

## 2014-03-31 ENCOUNTER — Ambulatory Visit (INDEPENDENT_AMBULATORY_CARE_PROVIDER_SITE_OTHER): Payer: Medicare Other | Admitting: Family Medicine

## 2014-03-31 ENCOUNTER — Encounter: Payer: Self-pay | Admitting: Family Medicine

## 2014-03-31 VITALS — BP 126/62 | HR 68 | Temp 97.9°F | Resp 18 | Ht 70.5 in | Wt 211.0 lb

## 2014-03-31 DIAGNOSIS — R972 Elevated prostate specific antigen [PSA]: Secondary | ICD-10-CM

## 2014-03-31 NOTE — Progress Notes (Signed)
Subjective:    Patient ID: Tyler Allison, male    DOB: 07-Feb-1941, 73 y.o.   MRN: [id number]  HPI  Please see the patient's complete physical 2 weeks ago. At that time he was using albuterol almost on a daily basis. Have the patient discontinue meloxicam and switch omeprazole to dexilant. Patient's breathing has improved dramatically. However he states that his acid reflux is not any better. He truly believe stopping the meloxicam helped his asthma. He has not required his albuterol at all in the last 2 weeks. I also treated the patient for possible prostatitis with Cipro. His urinary symptoms have not changed. He is here today to recheck a PSA. Past Medical History  Diagnosis Date  . Chronic back pain   . Allergy   . Asthma   . GERD (gastroesophageal reflux disease)   . Hypertension   . Hyperlipidemia    No past surgical history on file. Current Outpatient Prescriptions on File Prior to Visit  Medication Sig Dispense Refill  . albuterol (PROAIR HFA) 108 (90 BASE) MCG/ACT inhaler Inhale 2 puffs into the lungs every 4 (four) hours as needed. 8.5 g 3  . aspirin 81 MG tablet Take 81 mg by mouth. 2 - 3 times per week    . atorvastatin (LIPITOR) 40 MG tablet Take 1 tablet (40 mg total) by mouth at bedtime. 90 tablet 3  . levocetirizine (XYZAL) 5 MG tablet TAKE 1 TABLET (5 MG TOTAL) BY MOUTH EVERY EVENING. 90 tablet 3  . meloxicam (MOBIC) 7.5 MG tablet Take 7.5 mg by mouth daily.    . methocarbamol (ROBAXIN) 500 MG tablet Take 500 mg by mouth 4 (four) times daily.    . metoprolol succinate (TOPROL-XL) 25 MG 24 hr tablet Take 1 tablet (25 mg total) by mouth daily. 90 tablet 3  . Misc Natural Products (GLUCOSAMINE CHONDROITIN ADV PO) Take 1 tablet by mouth 2 (two) times daily.    . Multiple Vitamins-Minerals (OCUVITE PRESERVISION PO) Take by mouth.    . Omega-3 Fatty Acids (FISH OIL) 1200 MG CAPS Take 1,200-2,400 mg by mouth 2 (two) times daily. Takes 1200 mg in the morning and 2400 mg in the  evening    . omeprazole (PRILOSEC) 20 MG capsule Take 1 capsule (20 mg total) by mouth daily. 90 capsule 4  . traMADol (ULTRAM) 50 MG tablet Take by mouth every 6 (six) hours as needed.     No current facility-administered medications on file prior to visit.   Allergies  Allergen Reactions  . Codeine Nausea And Vomiting   History   Social History  . Marital Status: Married    Spouse Name: N/A    Number of Children: N/A  . Years of Education: N/A   Occupational History  . Not on file.   Social History Main Topics  . Smoking status: Never Smoker   . Smokeless tobacco: Never Used  . Alcohol Use: No  . Drug Use: No  . Sexual Activity: Not on file     Comment: married, retired.   Other Topics Concern  . Not on file   Social History Narrative     Review of Systems  All other systems reviewed and are negative.      Objective:   Physical Exam  Cardiovascular: Normal rate, regular rhythm and normal heart sounds.   Pulmonary/Chest: Effort normal and breath sounds normal. No respiratory distress. He has no wheezes. He has no rales.  Vitals reviewed.  Assessment & Plan:  Elevated PSA - Plan: PSA, total and free  I will check a free PSA and a total PSA. If the patient's PSA remains elevated, I will consult urology. If his PSA has returned to normal after antibiotics no further workup is necessary. I will have the patient avoid all NSAIDs as this seems to have triggered his asthma. I suggested that he resume omeprazole and discontinue dexilant.  If his breathing worsens over the next 2 weeks, we may need to switch the patient back to dexilant.

## 2014-04-01 LAB — PSA, TOTAL AND FREE
PSA FREE: 0.74 ng/mL
PSA, Free Pct: 17 % — ABNORMAL LOW (ref >25)
PSA: 4.23 ng/mL — ABNORMAL HIGH (ref <=4.00)

## 2014-04-02 ENCOUNTER — Telehealth: Payer: Self-pay | Admitting: Family Medicine

## 2014-04-02 DIAGNOSIS — R972 Elevated prostate specific antigen [PSA]: Secondary | ICD-10-CM

## 2014-04-02 NOTE — Telephone Encounter (Signed)
-----   Message from [name], MD sent at 04/01/2014  7:12 AM EST ----- psa is unchanged and free psa is low.  This is concerning for possible underlying malignancy.  I want to consult urology.

## 2014-04-02 NOTE — Telephone Encounter (Signed)
Pt aware of lab results and provider recommendations.  Referral to Urology initiated

## 2014-04-14 ENCOUNTER — Other Ambulatory Visit: Payer: Self-pay | Admitting: Family Medicine

## 2014-04-22 ENCOUNTER — Encounter: Payer: Self-pay | Admitting: Family Medicine

## 2014-09-01 ENCOUNTER — Telehealth: Payer: Self-pay | Admitting: Family Medicine

## 2014-09-01 DIAGNOSIS — Z1211 Encounter for screening for malignant neoplasm of colon: Secondary | ICD-10-CM

## 2014-09-01 NOTE — Telephone Encounter (Signed)
[phone] PT states Dr Dennard Schaumann was wanting him to have a colonoscopy and the pt wasn't sure if he needed to [facility] or if we did to get that scheduled.

## 2014-09-03 NOTE — Telephone Encounter (Signed)
Contacted pt back and he stated that Dr. Dennard Schaumann recommended him to have a colonoscopy done and wanted to knowif he needed to call them to schedule the appt or if we do, I informed the pt that I will place a referral to [facility] and they will be calling him to schedule appt. Pt stated that he is wanting Dr. Paulita Fujita to do the procedure.   Referral placed to [facility]

## 2014-09-08 ENCOUNTER — Other Ambulatory Visit: Payer: Self-pay | Admitting: Family Medicine

## 2014-10-14 ENCOUNTER — Other Ambulatory Visit: Payer: Medicare Other

## 2014-11-11 ENCOUNTER — Other Ambulatory Visit: Payer: Self-pay | Admitting: Neurological Surgery

## 2014-11-11 DIAGNOSIS — M545 Low back pain: Secondary | ICD-10-CM

## 2014-11-13 ENCOUNTER — Ambulatory Visit
Admission: RE | Admit: 2014-11-13 | Discharge: 2014-11-13 | Disposition: A | Discharge status: Home / Self Care | Payer: Medicare Other | Source: Ambulatory Visit | Attending: Neurological Surgery | Admitting: Neurological Surgery

## 2014-11-13 DIAGNOSIS — M545 Low back pain: Secondary | ICD-10-CM

## 2014-11-18 ENCOUNTER — Other Ambulatory Visit: Payer: Self-pay | Admitting: Family Medicine

## 2014-11-18 NOTE — Telephone Encounter (Signed)
Refill appropriate and filled per protocol. 

## 2014-12-26 ENCOUNTER — Telehealth: Payer: Self-pay | Admitting: Family Medicine

## 2014-12-26 MED ORDER — NAPROXEN 500 MG PO TABS: 500.0000 mg | ORAL_TABLET | Freq: Every day | ORAL | Status: DC

## 2014-12-26 NOTE — Telephone Encounter (Signed)
Medication called/sent to requested pharmacy  

## 2014-12-26 NOTE — Telephone Encounter (Signed)
Patient is calling to get [facility] for 3 month supply of naproxen   Phone number is [phone]

## 2015-01-13 ENCOUNTER — Other Ambulatory Visit: Payer: Self-pay | Admitting: Family Medicine

## 2015-03-02 ENCOUNTER — Other Ambulatory Visit: Payer: Self-pay | Admitting: Family Medicine

## 2015-03-02 NOTE — Telephone Encounter (Signed)
Medication refilled per protocol. 

## 2015-03-13 ENCOUNTER — Other Ambulatory Visit (INDEPENDENT_AMBULATORY_CARE_PROVIDER_SITE_OTHER): Payer: Medicare Other

## 2015-03-13 DIAGNOSIS — Z79899 Other long term (current) drug therapy: Secondary | ICD-10-CM

## 2015-03-13 DIAGNOSIS — E785 Hyperlipidemia, unspecified: Secondary | ICD-10-CM

## 2015-03-13 DIAGNOSIS — Z23 Encounter for immunization: Secondary | ICD-10-CM | POA: Diagnosis not present

## 2015-03-13 DIAGNOSIS — Z0000 Encounter for general adult medical examination without abnormal findings: Secondary | ICD-10-CM

## 2015-03-13 LAB — CBC WITH DIFFERENTIAL/PLATELET
BASOS PCT: 1 % (ref 0–1)
Basophils Absolute: 0.1 10*3/uL (ref 0.0–0.1)
Eosinophils Absolute: 0.6 10*3/uL (ref 0.0–0.7)
Eosinophils Relative: 8 % — ABNORMAL HIGH (ref 0–5)
HCT: 42.4 % (ref 39.0–52.0)
HEMOGLOBIN: 14.1 g/dL (ref 13.0–17.0)
Lymphocytes Relative: 34 % (ref 12–46)
Lymphs Abs: 2.4 10*3/uL (ref 0.7–4.0)
MCH: 30.5 pg (ref 26.0–34.0)
MCHC: 33.3 g/dL (ref 30.0–36.0)
MCV: 91.8 fL (ref 78.0–100.0)
MONOS PCT: 13 % — AB (ref 3–12)
MPV: 10 fL (ref 8.6–12.4)
Monocytes Absolute: 0.9 10*3/uL (ref 0.1–1.0)
NEUTROS ABS: 3.2 10*3/uL (ref 1.7–7.7)
Neutrophils Relative %: 44 % (ref 43–77)
Platelets: 250 10*3/uL (ref 150–400)
RBC: 4.62 MIL/uL (ref 4.22–5.81)
RDW: 13.1 % (ref 11.5–15.5)
WBC: 7.2 10*3/uL (ref 4.0–10.5)

## 2015-03-13 LAB — LIPID PANEL
Cholesterol: 144 mg/dL (ref 125–200)
HDL: 49 mg/dL (ref >=40)
LDL Cholesterol: 64 mg/dL (ref <130)
TRIGLYCERIDES: 153 mg/dL — AB (ref <150)
Total CHOL/HDL Ratio: 2.9 Ratio (ref <=5.0)
VLDL: 31 mg/dL — ABNORMAL HIGH (ref <30)

## 2015-03-13 LAB — COMPLETE METABOLIC PANEL WITH GFR
ALK PHOS: 83 U/L (ref 40–115)
ALT: 18 U/L (ref 9–46)
AST: 24 U/L (ref 10–35)
Albumin: 3.6 g/dL (ref 3.6–5.1)
BUN: 16 mg/dL (ref 7–25)
CALCIUM: 8.5 mg/dL — AB (ref 8.6–10.3)
CO2: 27 mmol/L (ref 20–31)
Chloride: 101 mmol/L (ref 98–110)
Creat: 0.93 mg/dL (ref 0.70–1.18)
GFR, EST NON AFRICAN AMERICAN: 80 mL/min (ref >=60)
GFR, Est African American: >89 mL/min (ref >=60)
Glucose, Bld: 95 mg/dL (ref 70–99)
Potassium: 4.4 mmol/L (ref 3.5–5.3)
Sodium: 136 mmol/L (ref 135–146)
TOTAL PROTEIN: 6.4 g/dL (ref 6.1–8.1)
Total Bilirubin: 0.6 mg/dL (ref 0.2–1.2)

## 2015-03-13 LAB — TSH: TSH: 5.442 u[IU]/mL — ABNORMAL HIGH (ref 0.350–4.500)

## 2015-03-17 ENCOUNTER — Ambulatory Visit (INDEPENDENT_AMBULATORY_CARE_PROVIDER_SITE_OTHER): Payer: Medicare Other | Admitting: Family Medicine

## 2015-03-17 ENCOUNTER — Encounter: Payer: Self-pay | Admitting: Family Medicine

## 2015-03-17 VITALS — BP 106/70 | HR 60 | Temp 98.0°F | Resp 18 | Ht 69.0 in | Wt 215.0 lb

## 2015-03-17 DIAGNOSIS — Z0000 Encounter for general adult medical examination without abnormal findings: Secondary | ICD-10-CM

## 2015-03-17 NOTE — Progress Notes (Signed)
Subjective:    Patient ID: Tyler Allison, male    DOB: 06-07-1940, 74 y.o.   MRN: [id number]  HPI  Subjective:   Patient presents for Medicare Annual/Subsequent preventive examination. Patient's PSA doubled last year. I refer the patient to a urologist. Darral Dash been monitoring his PSA every 6 months. His urologist will see him later this year. If the PSA continues to run in the 4-4.5 range, they have determined to spread the screening interval to every 12 months. However if it rises greater than 4.5 but will proceed with biopsy. I will defer the PSA and a digital rectal exam to the urologist. Otherwise the patient has been doing well. He does complain of severe diffuse myalgias in his lower back, and both legs, and both shoulders, and both biceps. He denies any fever or chills or weight loss. He is taking Lipitor. Patient's pneumonia vaccine /Pneumovax 23 is up-to-date. Prevnar 13 is up-to-date. His flu shot is up-to-date. He declines a tetanus shot today due to cost. Colonoscopy was performed in May and was normal and therefore the patient does not require this again  Review Past Medical/Family/Social: Past Medical History  Diagnosis Date  . Chronic back pain   . Allergy   . Asthma   . GERD (gastroesophageal reflux disease)   . Hypertension   . Hyperlipidemia    No past surgical history on file. Current Outpatient Prescriptions on File Prior to Visit  Medication Sig Dispense Refill  . albuterol (PROAIR HFA) 108 (90 BASE) MCG/ACT inhaler Inhale 2 puffs into the lungs every 4 (four) hours as needed. 8.5 g 3  . aspirin 81 MG tablet Take 81 mg by mouth. 2 - 3 times per week    . atorvastatin (LIPITOR) 40 MG tablet TAKE 1 BY MOUTH AT BEDTIME 90 tablet 0  . levocetirizine (XYZAL) 5 MG tablet TAKE 1 BY MOUTH EVERY EVENING 90 tablet 0  . metoprolol succinate (TOPROL-XL) 25 MG 24 hr tablet TAKE 1 BY MOUTH DAILY 90 tablet 4  . Misc Natural Products (GLUCOSAMINE CHONDROITIN ADV PO) Take 1 tablet  by mouth 2 (two) times daily.    . Multiple Vitamins-Minerals (OCUVITE PRESERVISION PO) Take by mouth.    . naproxen (NAPROSYN) 500 MG tablet Take 1 tablet (500 mg total) by mouth daily. 90 tablet 2  . Omega-3 Fatty Acids (FISH OIL) 1200 MG CAPS Take 1,200-2,400 mg by mouth 2 (two) times daily. Takes 1200 mg in the morning and 2400 mg in the evening    . omeprazole (PRILOSEC) 20 MG capsule TAKE 1 BY MOUTH DAILY 90 capsule 3  . traMADol (ULTRAM) 50 MG tablet Take by mouth every 6 (six) hours as needed.     No current facility-administered medications on file prior to visit.   Allergies  Allergen Reactions  . Codeine Nausea And Vomiting   Social History   Social History  . Marital Status: Married    Spouse Name: N/A  . Number of Children: N/A  . Years of Education: N/A   Occupational History  . Not on file.   Social History Main Topics  . Smoking status: Never Smoker   . Smokeless tobacco: Never Used  . Alcohol Use: No  . Drug Use: No  . Sexual Activity: Not on file     Comment: married, retired.   Other Topics Concern  . Not on file   Social History Narrative   No family history on file.  Depression Screen  (Note: if answer to  either of the following is "Yes", a more complete depression screening is indicated)  Over the past two weeks, have you felt down, depressed or hopeless? No Over the past two weeks, have you felt little interest or pleasure in doing things? No Have you lost interest or pleasure in daily life? No Do you often feel hopeless? No Do you cry easily over simple problems? No   Activities of Daily Living  In your present state of health, do you have any difficulty performing the following activities?:  Driving? No  Managing money? No  Feeding yourself? No  Getting from bed to chair? No  Climbing a flight of stairs? No  Preparing food and eating?: No  Bathing or showering? No  Getting dressed: No  Getting to the toilet? No  Using the toilet:No    Moving around from place to place: No  In the past year have you fallen or had a near fall?:No  Are you sexually active? No  Do you have more than one partner? No   Hearing Difficulties: No  Do you often ask people to speak up or repeat themselves? No  Do you experience ringing or noises in your ears? No Do you have difficulty understanding soft or whispered voices? No  Do you feel that you have a problem with memory? No Do you often misplace items? No  Do you feel safe at home? Yes  Cognitive Testing  Alert? Yes Normal Appearance?Yes  Oriented to person? Yes Place? Yes  Time? Yes  Recall of three objects? Yes  Can perform simple calculations? Yes  Displays appropriate judgment?Yes  Can read the correct time from a watch face?Yes   Screening Tests / Date Colonoscopy      2016              Zostavax utd Pneumovax utd prevnar utd Influenza Vaccine 2016 Tetanus/tdap 2006  Review of Systems  All other systems reviewed and are negative.      Objective:   Physical Exam  Constitutional: He is oriented to person, place, and time. He appears well-developed and well-nourished. No distress.  HENT:  Head: Normocephalic and atraumatic.  Right Ear: External ear normal.  Left Ear: External ear normal.  Nose: Nose normal.  Mouth/Throat: Oropharynx is clear and moist. No oropharyngeal exudate.  Eyes: Conjunctivae and EOM are normal. Pupils are equal, round, and reactive to light. Right eye exhibits no discharge. Left eye exhibits no discharge. No scleral icterus.  Neck: Normal range of motion. Neck supple. No JVD present. No tracheal deviation present. No thyromegaly present.  Cardiovascular: Normal rate, regular rhythm, normal heart sounds and intact distal pulses.  Exam reveals no gallop and no friction rub.   No murmur heard. Pulmonary/Chest: Effort normal and breath sounds normal. No stridor. No respiratory distress. He has no wheezes. He has no rales. He exhibits no tenderness.   Abdominal: Soft. Bowel sounds are normal. He exhibits no distension and no mass. There is no tenderness. There is no rebound and no guarding.  Musculoskeletal: Normal range of motion. He exhibits no edema or tenderness.  Lymphadenopathy:    He has no cervical adenopathy.  Neurological: He is alert and oriented to person, place, and time. He has normal reflexes. No cranial nerve deficit. He exhibits normal muscle tone. Coordination normal.  Skin: Skin is warm. No rash noted. He is not diaphoretic. No erythema. No pallor.  Psychiatric: He has a normal mood and affect. His behavior is normal. Judgment and thought content  normal.  Vitals reviewed.  Lab on 03/13/2015  Component Date Value Ref Range Status  . Sodium 03/13/2015 136  135 - 146 mmol/L Final  . Potassium 03/13/2015 4.4  3.5 - 5.3 mmol/L Final  . Chloride 03/13/2015 101  98 - 110 mmol/L Final  . CO2 03/13/2015 27  20 - 31 mmol/L Final  . Glucose, Bld 03/13/2015 95  70 - 99 mg/dL Final  . BUN 03/13/2015 16  7 - 25 mg/dL Final  . Creat 03/13/2015 0.93  0.70 - 1.18 mg/dL Final  . Total Bilirubin 03/13/2015 0.6  0.2 - 1.2 mg/dL Final  . Alkaline Phosphatase 03/13/2015 83  40 - 115 U/L Final  . AST 03/13/2015 24  10 - 35 U/L Final  . ALT 03/13/2015 18  9 - 46 U/L Final  . Total Protein 03/13/2015 6.4  6.1 - 8.1 g/dL Final  . Albumin 03/13/2015 3.6  3.6 - 5.1 g/dL Final  . Calcium 03/13/2015 8.5* 8.6 - 10.3 mg/dL Final  . GFR, Est African American 03/13/2015 >89  >=60 mL/min Final  . GFR, Est Non African American 03/13/2015 80  >=60 mL/min Final   Comment:   The estimated GFR is a calculation valid for adults (>=74 years old) that uses the CKD-EPI algorithm to adjust for age and sex. It is   not to be used for children, pregnant women, hospitalized patients,    patients on dialysis, or with rapidly changing kidney function. According to the NKDEP, eGFR >89 is normal, 60-89 shows mild impairment, 30-59 shows moderate impairment,  15-29 shows severe impairment and <15 is ESRD.     Marland Kitchen TSH 03/13/2015 5.442* 0.350 - 4.500 uIU/mL Final  . Cholesterol 03/13/2015 144  125 - 200 mg/dL Final  . Triglycerides 03/13/2015 153* <150 mg/dL Final  . HDL 03/13/2015 49  >=40 mg/dL Final  . Total CHOL/HDL Ratio 03/13/2015 2.9  <=5.0 Ratio Final  . VLDL 03/13/2015 31* <30 mg/dL Final  . LDL Cholesterol 03/13/2015 64  <130 mg/dL Final   Comment:   Total Cholesterol/HDL Ratio:CHD Risk                        Coronary Heart Disease Risk Table                                        Men       Women          1/2 Average Risk              3.4        3.3              Average Risk              5.0        4.4           2X Average Risk              9.6        7.1           3X Average Risk             23.4       11.0 Use the calculated Patient Ratio above and the CHD Risk table  to determine the patient's CHD Risk.   . WBC 03/13/2015 7.2  4.0 - 10.5 K/uL Final  .  RBC 03/13/2015 4.62  4.22 - 5.81 MIL/uL Final  . Hemoglobin 03/13/2015 14.1  13.0 - 17.0 g/dL Final  . HCT 03/13/2015 42.4  39.0 - 52.0 % Final  . MCV 03/13/2015 91.8  78.0 - 100.0 fL Final  . MCH 03/13/2015 30.5  26.0 - 34.0 pg Final  . MCHC 03/13/2015 33.3  30.0 - 36.0 g/dL Final  . RDW 03/13/2015 13.1  11.5 - 15.5 % Final  . Platelets 03/13/2015 250  150 - 400 K/uL Final  . MPV 03/13/2015 10.0  8.6 - 12.4 fL Final  . Neutrophils Relative % 03/13/2015 44  43 - 77 % Final  . Neutro Abs 03/13/2015 3.2  1.7 - 7.7 K/uL Final  . Lymphocytes Relative 03/13/2015 34  12 - 46 % Final  . Lymphs Abs 03/13/2015 2.4  0.7 - 4.0 K/uL Final  . Monocytes Relative 03/13/2015 13* 3 - 12 % Final  . Monocytes Absolute 03/13/2015 0.9  0.1 - 1.0 K/uL Final  . Eosinophils Relative 03/13/2015 8* 0 - 5 % Final  . Eosinophils Absolute 03/13/2015 0.6  0.0 - 0.7 K/uL Final  . Basophils Relative 03/13/2015 1  0 - 1 % Final  . Basophils Absolute 03/13/2015 0.1  0.0 - 0.1 K/uL Final  . Smear Review  03/13/2015 Criteria for review not met   Final          Assessment & Plan:  Routine general medical examination at a health care facility   Physical exam is normal. I will defer digital rectal exam and PSA to the patient's urologist. Colonoscopy is up-to-date. The patient's lab work is excellent. Given the diffuse muscle pains all over the patient's body, I have recommended that he temporarily discontinue Lipitor and recheck in 2-3 weeks. If muscle pains improve, I recommended that we stay off cholesterol medication for 3 months and then recheck his cholesterol to determine if he would truly benefit from just staying off the medication altogether. If muscle pain does not improve, consider autoimmune diseases such as lupus versus PMR. Immunizations are up-to-date. The patient declines a tetanus shot today due to cost. The remainder of his lab work is excellent. Medicare Attestation  I have personally reviewed:  The patient's medical and social history  Their use of alcohol, tobacco or illicit drugs  Their current medications and supplements  The patient's functional ability including ADLs,fall risks, home safety risks, cognitive, and hearing and visual impairment  Diet and physical activities  Evidence for depression or mood disorders  The patient's weight, height, BMI, and visual acuity have been recorded in the chart. I have made referrals, counseling, and provided education to the patient based on review of the above and I have provided the patient with a written personalized care plan for preventive services.

## 2015-04-20 ENCOUNTER — Encounter: Payer: Self-pay | Admitting: Family Medicine

## 2015-04-20 DIAGNOSIS — M4722 Other spondylosis with radiculopathy, cervical region: Secondary | ICD-10-CM | POA: Insufficient documentation

## 2015-05-07 ENCOUNTER — Ambulatory Visit (INDEPENDENT_AMBULATORY_CARE_PROVIDER_SITE_OTHER): Payer: Medicare Other | Admitting: Family Medicine

## 2015-05-07 ENCOUNTER — Encounter: Payer: Self-pay | Admitting: Family Medicine

## 2015-05-07 VITALS — BP 110/70 | HR 62 | Temp 98.0°F | Resp 16 | Ht 70.5 in | Wt 219.0 lb

## 2015-05-07 DIAGNOSIS — J069 Acute upper respiratory infection, unspecified: Secondary | ICD-10-CM

## 2015-05-07 DIAGNOSIS — J329 Chronic sinusitis, unspecified: Secondary | ICD-10-CM | POA: Diagnosis not present

## 2015-05-07 DIAGNOSIS — J310 Chronic rhinitis: Secondary | ICD-10-CM

## 2015-05-07 MED ORDER — AMOXICILLIN 875 MG PO TABS: 875.0000 mg | ORAL_TABLET | Freq: Two times a day (BID) | ORAL | Status: DC

## 2015-05-07 NOTE — Progress Notes (Signed)
Subjective:    Patient ID: Tyler Allison, male    DOB: [birth year], 74 y.o.   MRN: [id number]  HPI Patient reports 5 days of pain and pressure in his frontal and maxillary sinuses. He is also reporting significant nasal congestion and rhinorrhea and postnasal drip and sore and scratchy throat. He has occasional cough. He denies any fevers or chills. He denies any purulent nasal drainage. Past Medical History  Diagnosis Date  . Chronic back pain   . Allergy   . Asthma   . GERD (gastroesophageal reflux disease)   . Hypertension   . Hyperlipidemia   . Cervical spondylosis with radiculopathy     left c6 radiculopathy   No past surgical history on file. Current Outpatient Prescriptions on File Prior to Visit  Medication Sig Dispense Refill  . albuterol (PROAIR HFA) 108 (90 BASE) MCG/ACT inhaler Inhale 2 puffs into the lungs every 4 (four) hours as needed. 8.5 g 3  . aspirin 81 MG tablet Take 81 mg by mouth. 2 - 3 times per week    . atorvastatin (LIPITOR) 40 MG tablet TAKE 1 BY MOUTH AT BEDTIME 90 tablet 0  . cholecalciferol (VITAMIN D) 1000 UNITS tablet Take 1,000 Units by mouth daily.    Marland Kitchen levocetirizine (XYZAL) 5 MG tablet TAKE 1 BY MOUTH EVERY EVENING 90 tablet 0  . metoprolol succinate (TOPROL-XL) 25 MG 24 hr tablet TAKE 1 BY MOUTH DAILY 90 tablet 4  . Misc Natural Products (GLUCOSAMINE CHONDROITIN ADV PO) Take 1 tablet by mouth 2 (two) times daily.    . Multiple Vitamins-Minerals (OCUVITE PRESERVISION PO) Take by mouth.    . naproxen (NAPROSYN) 500 MG tablet Take 1 tablet (500 mg total) by mouth daily. 90 tablet 2  . Omega-3 Fatty Acids (FISH OIL) 1200 MG CAPS Take 1,200-2,400 mg by mouth 2 (two) times daily. Takes 1200 mg in the morning and 2400 mg in the evening    . omeprazole (PRILOSEC) 20 MG capsule TAKE 1 BY MOUTH DAILY 90 capsule 3  . tamsulosin (FLOMAX) 0.4 MG CAPS capsule Take 1 capsule by mouth daily.  2  . traMADol (ULTRAM) 50 MG tablet Take by mouth every 6 (six) hours  as needed.     No current facility-administered medications on file prior to visit.   Allergies  Allergen Reactions  . Codeine Nausea And Vomiting   Social History   Social History  . Marital Status: Married    Spouse Name: N/A  . Number of Children: N/A  . Years of Education: N/A   Occupational History  . Not on file.   Social History Main Topics  . Smoking status: Never Smoker   . Smokeless tobacco: Never Used  . Alcohol Use: No  . Drug Use: No  . Sexual Activity: Not on file     Comment: married, retired.   Other Topics Concern  . Not on file   Social History Narrative     Review of Systems  All other systems reviewed and are negative.      Objective:   Physical Exam  Constitutional: He appears well-developed and well-nourished.  HENT:  Right Ear: Tympanic membrane, external ear and ear canal normal.  Left Ear: Tympanic membrane, external ear and ear canal normal.  Nose: Mucosal edema and rhinorrhea present.  Mouth/Throat: Oropharynx is clear and moist. No oropharyngeal exudate.  Neck: Neck supple.  Cardiovascular: Normal rate, regular rhythm and normal heart sounds.   Pulmonary/Chest: Effort normal and breath sounds  normal. No respiratory distress. He has no wheezes. He has no rales.  Lymphadenopathy:    He has no cervical adenopathy.  Vitals reviewed.         Assessment & Plan:  Viral URI  Rhinosinusitis - Plan: amoxicillin (AMOXIL) 875 MG tablet  Patient has a viral upper respiratory infection/sinusitis. However I explained to him that 90% of these infections improve in 7-10 days on their own with or without antibiotics. Therefore I recommended tincture of time. He can use Sudafed and/or Afrin for nasal congestion. Wait an additional 3 or 4 days. If symptoms worsen or if they persist greater than 10 days in total, he can start amoxicillin

## 2015-05-13 DIAGNOSIS — R3916 Straining to void: Secondary | ICD-10-CM | POA: Diagnosis not present

## 2015-05-13 DIAGNOSIS — R972 Elevated prostate specific antigen [PSA]: Secondary | ICD-10-CM | POA: Diagnosis not present

## 2015-05-13 DIAGNOSIS — N401 Enlarged prostate with lower urinary tract symptoms: Secondary | ICD-10-CM | POA: Diagnosis not present

## 2015-05-13 DIAGNOSIS — Z0000 Encounter for general adult medical examination without abnormal findings: Secondary | ICD-10-CM | POA: Diagnosis not present

## 2015-05-25 DIAGNOSIS — M19071 Primary osteoarthritis, right ankle and foot: Secondary | ICD-10-CM | POA: Diagnosis not present

## 2015-06-09 ENCOUNTER — Other Ambulatory Visit: Payer: Self-pay | Admitting: Family Medicine

## 2015-06-09 NOTE — Telephone Encounter (Signed)
Patient called in requesting refills to be sent to [facility] order their fax # is [phone]  metoprolol succinate (TOPROL-XL) 25 MG 24 hr tablet  levocetirizine (XYZAL) 5 MG tablet  omeprazole (PRILOSEC) 20 MG capsule     He woulld like a 90 day supply of each medicine and he would like a call letting him know this has been taken care of. He states this is his 3rd time calling  CB# [phone]

## 2015-06-10 MED ORDER — OMEPRAZOLE 20 MG PO CPDR: DELAYED_RELEASE_CAPSULE | ORAL | Status: DC

## 2015-06-10 MED ORDER — METOPROLOL SUCCINATE ER 25 MG PO TB24: ORAL_TABLET | ORAL | Status: DC

## 2015-06-10 MED ORDER — LEVOCETIRIZINE DIHYDROCHLORIDE 5 MG PO TABS: ORAL_TABLET | ORAL | Status: DC

## 2015-06-10 NOTE — Telephone Encounter (Signed)
Script sent to [facility] mail order per pt request

## 2015-09-08 DIAGNOSIS — M5412 Radiculopathy, cervical region: Secondary | ICD-10-CM | POA: Diagnosis not present

## 2015-09-08 DIAGNOSIS — M9901 Segmental and somatic dysfunction of cervical region: Secondary | ICD-10-CM | POA: Diagnosis not present

## 2015-09-08 DIAGNOSIS — M50322 Other cervical disc degeneration at C5-C6 level: Secondary | ICD-10-CM | POA: Diagnosis not present

## 2015-09-08 DIAGNOSIS — M9902 Segmental and somatic dysfunction of thoracic region: Secondary | ICD-10-CM | POA: Diagnosis not present

## 2015-09-08 DIAGNOSIS — M5384 Other specified dorsopathies, thoracic region: Secondary | ICD-10-CM | POA: Diagnosis not present

## 2015-09-14 DIAGNOSIS — M9902 Segmental and somatic dysfunction of thoracic region: Secondary | ICD-10-CM | POA: Diagnosis not present

## 2015-09-14 DIAGNOSIS — M50322 Other cervical disc degeneration at C5-C6 level: Secondary | ICD-10-CM | POA: Diagnosis not present

## 2015-09-14 DIAGNOSIS — M5412 Radiculopathy, cervical region: Secondary | ICD-10-CM | POA: Diagnosis not present

## 2015-09-14 DIAGNOSIS — M5384 Other specified dorsopathies, thoracic region: Secondary | ICD-10-CM | POA: Diagnosis not present

## 2015-09-14 DIAGNOSIS — M9901 Segmental and somatic dysfunction of cervical region: Secondary | ICD-10-CM | POA: Diagnosis not present

## 2015-09-15 DIAGNOSIS — M50322 Other cervical disc degeneration at C5-C6 level: Secondary | ICD-10-CM | POA: Diagnosis not present

## 2015-09-15 DIAGNOSIS — M9901 Segmental and somatic dysfunction of cervical region: Secondary | ICD-10-CM | POA: Diagnosis not present

## 2015-09-15 DIAGNOSIS — M5412 Radiculopathy, cervical region: Secondary | ICD-10-CM | POA: Diagnosis not present

## 2015-09-15 DIAGNOSIS — M5384 Other specified dorsopathies, thoracic region: Secondary | ICD-10-CM | POA: Diagnosis not present

## 2015-09-15 DIAGNOSIS — M9902 Segmental and somatic dysfunction of thoracic region: Secondary | ICD-10-CM | POA: Diagnosis not present

## 2015-09-17 DIAGNOSIS — M9901 Segmental and somatic dysfunction of cervical region: Secondary | ICD-10-CM | POA: Diagnosis not present

## 2015-09-17 DIAGNOSIS — M5412 Radiculopathy, cervical region: Secondary | ICD-10-CM | POA: Diagnosis not present

## 2015-09-17 DIAGNOSIS — M5384 Other specified dorsopathies, thoracic region: Secondary | ICD-10-CM | POA: Diagnosis not present

## 2015-09-17 DIAGNOSIS — M50322 Other cervical disc degeneration at C5-C6 level: Secondary | ICD-10-CM | POA: Diagnosis not present

## 2015-09-17 DIAGNOSIS — M9902 Segmental and somatic dysfunction of thoracic region: Secondary | ICD-10-CM | POA: Diagnosis not present

## 2015-09-21 DIAGNOSIS — M5412 Radiculopathy, cervical region: Secondary | ICD-10-CM | POA: Diagnosis not present

## 2015-09-21 DIAGNOSIS — M50322 Other cervical disc degeneration at C5-C6 level: Secondary | ICD-10-CM | POA: Diagnosis not present

## 2015-09-21 DIAGNOSIS — M9901 Segmental and somatic dysfunction of cervical region: Secondary | ICD-10-CM | POA: Diagnosis not present

## 2015-09-21 DIAGNOSIS — M9902 Segmental and somatic dysfunction of thoracic region: Secondary | ICD-10-CM | POA: Diagnosis not present

## 2015-09-21 DIAGNOSIS — M5384 Other specified dorsopathies, thoracic region: Secondary | ICD-10-CM | POA: Diagnosis not present

## 2015-09-22 DIAGNOSIS — M5384 Other specified dorsopathies, thoracic region: Secondary | ICD-10-CM | POA: Diagnosis not present

## 2015-09-22 DIAGNOSIS — M9901 Segmental and somatic dysfunction of cervical region: Secondary | ICD-10-CM | POA: Diagnosis not present

## 2015-09-22 DIAGNOSIS — M9902 Segmental and somatic dysfunction of thoracic region: Secondary | ICD-10-CM | POA: Diagnosis not present

## 2015-09-22 DIAGNOSIS — M5412 Radiculopathy, cervical region: Secondary | ICD-10-CM | POA: Diagnosis not present

## 2015-09-22 DIAGNOSIS — M50322 Other cervical disc degeneration at C5-C6 level: Secondary | ICD-10-CM | POA: Diagnosis not present

## 2015-09-24 DIAGNOSIS — M9901 Segmental and somatic dysfunction of cervical region: Secondary | ICD-10-CM | POA: Diagnosis not present

## 2015-09-24 DIAGNOSIS — M5384 Other specified dorsopathies, thoracic region: Secondary | ICD-10-CM | POA: Diagnosis not present

## 2015-09-24 DIAGNOSIS — M50322 Other cervical disc degeneration at C5-C6 level: Secondary | ICD-10-CM | POA: Diagnosis not present

## 2015-09-24 DIAGNOSIS — M5412 Radiculopathy, cervical region: Secondary | ICD-10-CM | POA: Diagnosis not present

## 2015-09-24 DIAGNOSIS — M9902 Segmental and somatic dysfunction of thoracic region: Secondary | ICD-10-CM | POA: Diagnosis not present

## 2015-09-28 DIAGNOSIS — M50322 Other cervical disc degeneration at C5-C6 level: Secondary | ICD-10-CM | POA: Diagnosis not present

## 2015-09-28 DIAGNOSIS — M5384 Other specified dorsopathies, thoracic region: Secondary | ICD-10-CM | POA: Diagnosis not present

## 2015-09-28 DIAGNOSIS — M9901 Segmental and somatic dysfunction of cervical region: Secondary | ICD-10-CM | POA: Diagnosis not present

## 2015-09-28 DIAGNOSIS — M9902 Segmental and somatic dysfunction of thoracic region: Secondary | ICD-10-CM | POA: Diagnosis not present

## 2015-09-28 DIAGNOSIS — M5412 Radiculopathy, cervical region: Secondary | ICD-10-CM | POA: Diagnosis not present

## 2015-09-29 DIAGNOSIS — M9902 Segmental and somatic dysfunction of thoracic region: Secondary | ICD-10-CM | POA: Diagnosis not present

## 2015-09-29 DIAGNOSIS — M50322 Other cervical disc degeneration at C5-C6 level: Secondary | ICD-10-CM | POA: Diagnosis not present

## 2015-09-29 DIAGNOSIS — M5384 Other specified dorsopathies, thoracic region: Secondary | ICD-10-CM | POA: Diagnosis not present

## 2015-09-29 DIAGNOSIS — M5412 Radiculopathy, cervical region: Secondary | ICD-10-CM | POA: Diagnosis not present

## 2015-09-29 DIAGNOSIS — M9901 Segmental and somatic dysfunction of cervical region: Secondary | ICD-10-CM | POA: Diagnosis not present

## 2015-10-01 DIAGNOSIS — M50322 Other cervical disc degeneration at C5-C6 level: Secondary | ICD-10-CM | POA: Diagnosis not present

## 2015-10-01 DIAGNOSIS — M5412 Radiculopathy, cervical region: Secondary | ICD-10-CM | POA: Diagnosis not present

## 2015-10-01 DIAGNOSIS — M9901 Segmental and somatic dysfunction of cervical region: Secondary | ICD-10-CM | POA: Diagnosis not present

## 2015-10-01 DIAGNOSIS — M5384 Other specified dorsopathies, thoracic region: Secondary | ICD-10-CM | POA: Diagnosis not present

## 2015-10-01 DIAGNOSIS — M9902 Segmental and somatic dysfunction of thoracic region: Secondary | ICD-10-CM | POA: Diagnosis not present

## 2015-10-06 DIAGNOSIS — M9901 Segmental and somatic dysfunction of cervical region: Secondary | ICD-10-CM | POA: Diagnosis not present

## 2015-10-06 DIAGNOSIS — M50322 Other cervical disc degeneration at C5-C6 level: Secondary | ICD-10-CM | POA: Diagnosis not present

## 2015-10-06 DIAGNOSIS — M9902 Segmental and somatic dysfunction of thoracic region: Secondary | ICD-10-CM | POA: Diagnosis not present

## 2015-10-06 DIAGNOSIS — M5384 Other specified dorsopathies, thoracic region: Secondary | ICD-10-CM | POA: Diagnosis not present

## 2015-10-06 DIAGNOSIS — M5412 Radiculopathy, cervical region: Secondary | ICD-10-CM | POA: Diagnosis not present

## 2015-10-07 DIAGNOSIS — M9902 Segmental and somatic dysfunction of thoracic region: Secondary | ICD-10-CM | POA: Diagnosis not present

## 2015-10-07 DIAGNOSIS — M9901 Segmental and somatic dysfunction of cervical region: Secondary | ICD-10-CM | POA: Diagnosis not present

## 2015-10-07 DIAGNOSIS — M5412 Radiculopathy, cervical region: Secondary | ICD-10-CM | POA: Diagnosis not present

## 2015-10-07 DIAGNOSIS — M50322 Other cervical disc degeneration at C5-C6 level: Secondary | ICD-10-CM | POA: Diagnosis not present

## 2015-10-07 DIAGNOSIS — M5384 Other specified dorsopathies, thoracic region: Secondary | ICD-10-CM | POA: Diagnosis not present

## 2015-10-08 DIAGNOSIS — M5384 Other specified dorsopathies, thoracic region: Secondary | ICD-10-CM | POA: Diagnosis not present

## 2015-10-08 DIAGNOSIS — M50322 Other cervical disc degeneration at C5-C6 level: Secondary | ICD-10-CM | POA: Diagnosis not present

## 2015-10-08 DIAGNOSIS — M5412 Radiculopathy, cervical region: Secondary | ICD-10-CM | POA: Diagnosis not present

## 2015-10-08 DIAGNOSIS — M9902 Segmental and somatic dysfunction of thoracic region: Secondary | ICD-10-CM | POA: Diagnosis not present

## 2015-10-08 DIAGNOSIS — M9901 Segmental and somatic dysfunction of cervical region: Secondary | ICD-10-CM | POA: Diagnosis not present

## 2015-10-12 DIAGNOSIS — M9902 Segmental and somatic dysfunction of thoracic region: Secondary | ICD-10-CM | POA: Diagnosis not present

## 2015-10-12 DIAGNOSIS — M5412 Radiculopathy, cervical region: Secondary | ICD-10-CM | POA: Diagnosis not present

## 2015-10-12 DIAGNOSIS — M9901 Segmental and somatic dysfunction of cervical region: Secondary | ICD-10-CM | POA: Diagnosis not present

## 2015-10-12 DIAGNOSIS — M5384 Other specified dorsopathies, thoracic region: Secondary | ICD-10-CM | POA: Diagnosis not present

## 2015-10-12 DIAGNOSIS — M50322 Other cervical disc degeneration at C5-C6 level: Secondary | ICD-10-CM | POA: Diagnosis not present

## 2015-10-13 DIAGNOSIS — M5412 Radiculopathy, cervical region: Secondary | ICD-10-CM | POA: Diagnosis not present

## 2015-10-13 DIAGNOSIS — M9902 Segmental and somatic dysfunction of thoracic region: Secondary | ICD-10-CM | POA: Diagnosis not present

## 2015-10-13 DIAGNOSIS — M9901 Segmental and somatic dysfunction of cervical region: Secondary | ICD-10-CM | POA: Diagnosis not present

## 2015-10-13 DIAGNOSIS — M5384 Other specified dorsopathies, thoracic region: Secondary | ICD-10-CM | POA: Diagnosis not present

## 2015-10-13 DIAGNOSIS — M50322 Other cervical disc degeneration at C5-C6 level: Secondary | ICD-10-CM | POA: Diagnosis not present

## 2015-10-15 DIAGNOSIS — M9902 Segmental and somatic dysfunction of thoracic region: Secondary | ICD-10-CM | POA: Diagnosis not present

## 2015-10-15 DIAGNOSIS — M9901 Segmental and somatic dysfunction of cervical region: Secondary | ICD-10-CM | POA: Diagnosis not present

## 2015-10-15 DIAGNOSIS — M50322 Other cervical disc degeneration at C5-C6 level: Secondary | ICD-10-CM | POA: Diagnosis not present

## 2015-10-15 DIAGNOSIS — M5412 Radiculopathy, cervical region: Secondary | ICD-10-CM | POA: Diagnosis not present

## 2015-10-15 DIAGNOSIS — M5384 Other specified dorsopathies, thoracic region: Secondary | ICD-10-CM | POA: Diagnosis not present

## 2015-11-04 ENCOUNTER — Encounter: Payer: Self-pay | Admitting: Physician Assistant

## 2015-11-04 ENCOUNTER — Ambulatory Visit (INDEPENDENT_AMBULATORY_CARE_PROVIDER_SITE_OTHER): Payer: PPO | Admitting: Physician Assistant

## 2015-11-04 VITALS — BP 108/64 | HR 72 | Temp 98.7°F | Resp 16 | Ht 70.5 in | Wt 204.0 lb

## 2015-11-04 DIAGNOSIS — K219 Gastro-esophageal reflux disease without esophagitis: Secondary | ICD-10-CM

## 2015-11-04 DIAGNOSIS — R202 Paresthesia of skin: Secondary | ICD-10-CM | POA: Diagnosis not present

## 2015-11-04 MED ORDER — OMEPRAZOLE 40 MG PO CPDR: 40.0000 mg | DELAYED_RELEASE_CAPSULE | Freq: Every day | ORAL | Status: DC

## 2015-11-04 NOTE — Progress Notes (Signed)
Patient ID: Tyler Allison MRN: [id number], DOB: 21-Dec-1940, 75 y.o. Date of Encounter: 11/04/2015, 4:55 PM    Chief Complaint:  Chief Complaint  Patient presents with  . OTHER    Numbness/tingling on the left side of his face x 2 days, states he has had neck problems in the past and thinks it may be from that     HPI: 75 y.o. year old white male presents with above.  He says that he has no numbness. Says that he has just felt a tingling sensation off and on for 2-3 days. Says that he sees a chiropractor for his neck and wasn't sure if this is related to that.  Says that he felt this sensation Monday night when lying in the bed and that the sensation was there for about 1-2 hours. Says that it feels like something crawling on the skin on the left cheek. Says that at times it also feels like  little muscle spasm/pulsation.  Has has had no hypersensitivity in the area. To touch the left cheek feels the same as touching the right cheek.  Says that he felt this sensation Monday night and has felt it 3 or 4 times since then.  Has had no slurred speech. Has had no other neurologic changes or deficits. No weakness in any extremity.   Also request that increase his omeprazole from 20 mg to 40 mg. Says in the past he was on Nexium but it was expensive so he was changed to omeprazole but says this isn't quite controlling those symptoms.     Home Meds:   Outpatient Prescriptions Prior to Visit  Medication Sig Dispense Refill  . albuterol (PROAIR HFA) 108 (90 BASE) MCG/ACT inhaler Inhale 2 puffs into the lungs every 4 (four) hours as needed. 8.5 g 3  . aspirin 81 MG tablet Take 81 mg by mouth. 2 - 3 times per week    . cholecalciferol (VITAMIN D) 1000 UNITS tablet Take 1,000 Units by mouth daily.    Marland Kitchen levocetirizine (XYZAL) 5 MG tablet TAKE 1 BY MOUTH EVERY EVENING 90 tablet 1  . metoprolol succinate (TOPROL-XL) 25 MG 24 hr tablet TAKE 1 BY MOUTH DAILY 90 tablet 1  . Multiple  Vitamins-Minerals (OCUVITE PRESERVISION PO) Take by mouth.    . Omega-3 Fatty Acids (FISH OIL) 1200 MG CAPS Take 1,200-2,400 mg by mouth 2 (two) times daily. Takes 1200 mg in the morning and 2400 mg in the evening    . tamsulosin (FLOMAX) 0.4 MG CAPS capsule Take 1 capsule by mouth daily.  2  . traMADol (ULTRAM) 50 MG tablet Take by mouth every 6 (six) hours as needed.    Marland Kitchen omeprazole (PRILOSEC) 20 MG capsule TAKE 1 BY MOUTH DAILY 90 capsule 1  . amoxicillin (AMOXIL) 875 MG tablet Take 1 tablet (875 mg total) by mouth 2 (two) times daily. (Patient not taking: Reported on 11/04/2015) 20 tablet 0  . atorvastatin (LIPITOR) 40 MG tablet TAKE 1 BY MOUTH AT BEDTIME (Patient not taking: Reported on 11/04/2015) 90 tablet 0  . benzonatate (TESSALON) 100 MG capsule 100 mg 3 (three) times daily. Reported on 11/04/2015  0  . ipratropium (ATROVENT) 0.06 % nasal spray Reported on 11/04/2015  0  . Misc Natural Products (GLUCOSAMINE CHONDROITIN ADV PO) Take 1 tablet by mouth 2 (two) times daily. Reported on 11/04/2015    . naproxen (NAPROSYN) 500 MG tablet Take 1 tablet (500 mg total) by mouth daily. (Patient not taking: Reported  on 11/04/2015) 90 tablet 2   No facility-administered medications prior to visit.    Allergies:  Allergies  Allergen Reactions  . Codeine Nausea And Vomiting      Review of Systems: See HPI for pertinent ROS. All other ROS negative.    Physical Exam: Blood pressure 108/64, pulse 72, temperature 98.7 F (37.1 C), temperature source Oral, resp. rate 16, height 5' 10.5" (1.791 m), weight 204 lb (92.534 kg)., Body mass index is 28.85 kg/(m^2). General:  WNWD WM. Appears in no acute distress. HEENT: Normocephalic, atraumatic, eyes without discharge, sclera non-icteric, nares are without discharge. Bilateral auditory canals clear, TM's are without perforation, pearly grey and translucent with reflective cone of light bilaterally. No tenderness with palpation of the TM joint  bilaterally. No hypersensitivity with light touch to the cheek region bilaterally. Neck: Supple. No thyromegaly. No lymphadenopathy. Lungs: Clear bilaterally to auscultation without wheezes, rales, or rhonchi. Breathing is unlabored. Heart: Regular rhythm. No murmurs, rubs, or gallops. Msk:  Strength and tone normal for age. Extremities/Skin: Warm and dry. Neuro: Alert and oriented X 3. Moves all extremities spontaneously. Gait is normal. CNII-XII grossly in tact. When he talks and when he smiles, mouth is not quite symmetrical---I had him look in the mirror and look at his mouth with talking and smiling---he says this is the way his mouth has always been. Says there is no change.  Remainder of cranial nerve exam normal. 5/5 bilateral grip strength, bilateral upper and lower extremity strength. Psych:  Responds to questions appropriately with a normal affect.     ASSESSMENT AND PLAN:  75 y.o. year old male with  1. Paresthesia Not consistent with TMJ Not consistent with trigeminal neuralgia Not consistent with stroke  Suspect that this is secondary to some nerve irritation which is probably related to his neck. Offered muscle relaxer other medication to help with this. He defers any medication. Says that the symptoms are very minimal and does not need treatment to cover up the symptoms just wanted to make sure that there is no significant underlying problem. Says actually his wife is the one that made him come in to make sure no underlying problem. He is to follow-up if symptoms worsen or progress or change.  2. Gastroesophageal reflux disease, esophagitis presence not specified - omeprazole (PRILOSEC) 40 MG capsule; Take 1 capsule (40 mg total) by mouth daily.  Dispense: 90 capsule; Refill: 2   Signed, [address] [place], [place], [facility] 11/04/2015 4:55 PM

## 2015-11-09 DIAGNOSIS — Z961 Presence of intraocular lens: Secondary | ICD-10-CM | POA: Diagnosis not present

## 2015-11-09 DIAGNOSIS — H353121 Nonexudative age-related macular degeneration, left eye, early dry stage: Secondary | ICD-10-CM | POA: Diagnosis not present

## 2015-11-09 DIAGNOSIS — H353111 Nonexudative age-related macular degeneration, right eye, early dry stage: Secondary | ICD-10-CM | POA: Diagnosis not present

## 2015-11-09 DIAGNOSIS — H5203 Hypermetropia, bilateral: Secondary | ICD-10-CM | POA: Diagnosis not present

## 2015-11-18 DIAGNOSIS — R972 Elevated prostate specific antigen [PSA]: Secondary | ICD-10-CM | POA: Diagnosis not present

## 2015-11-25 DIAGNOSIS — R351 Nocturia: Secondary | ICD-10-CM | POA: Diagnosis not present

## 2015-11-25 DIAGNOSIS — R972 Elevated prostate specific antigen [PSA]: Secondary | ICD-10-CM | POA: Diagnosis not present

## 2015-11-25 DIAGNOSIS — N401 Enlarged prostate with lower urinary tract symptoms: Secondary | ICD-10-CM | POA: Diagnosis not present

## 2015-12-09 ENCOUNTER — Other Ambulatory Visit: Payer: Self-pay | Admitting: Family Medicine

## 2015-12-09 MED ORDER — METOPROLOL SUCCINATE ER 25 MG PO TB24: ORAL_TABLET | ORAL | 3 refills | Status: DC

## 2015-12-09 MED ORDER — LEVOCETIRIZINE DIHYDROCHLORIDE 5 MG PO TABS: ORAL_TABLET | ORAL | 3 refills | Status: DC

## 2015-12-09 NOTE — Telephone Encounter (Signed)
Medication called/sent to requested pharmacy  

## 2015-12-28 DIAGNOSIS — M542 Cervicalgia: Secondary | ICD-10-CM | POA: Diagnosis not present

## 2015-12-30 ENCOUNTER — Other Ambulatory Visit: Payer: Self-pay | Admitting: Neurological Surgery

## 2015-12-30 DIAGNOSIS — M542 Cervicalgia: Secondary | ICD-10-CM

## 2016-01-09 ENCOUNTER — Ambulatory Visit
Admission: RE | Admit: 2016-01-09 | Discharge: 2016-01-09 | Disposition: A | Discharge status: Home / Self Care | Payer: PPO | Source: Ambulatory Visit | Attending: Neurological Surgery | Admitting: Neurological Surgery

## 2016-01-09 DIAGNOSIS — M4802 Spinal stenosis, cervical region: Secondary | ICD-10-CM | POA: Diagnosis not present

## 2016-01-09 DIAGNOSIS — M542 Cervicalgia: Secondary | ICD-10-CM

## 2016-01-12 DIAGNOSIS — M542 Cervicalgia: Secondary | ICD-10-CM | POA: Diagnosis not present

## 2016-01-18 DIAGNOSIS — M542 Cervicalgia: Secondary | ICD-10-CM | POA: Diagnosis not present

## 2016-01-18 DIAGNOSIS — Z01812 Encounter for preprocedural laboratory examination: Secondary | ICD-10-CM | POA: Diagnosis not present

## 2016-01-29 DIAGNOSIS — M5412 Radiculopathy, cervical region: Secondary | ICD-10-CM | POA: Diagnosis not present

## 2016-01-29 DIAGNOSIS — M4722 Other spondylosis with radiculopathy, cervical region: Secondary | ICD-10-CM | POA: Diagnosis not present

## 2016-01-29 DIAGNOSIS — M47892 Other spondylosis, cervical region: Secondary | ICD-10-CM | POA: Diagnosis not present

## 2016-02-09 DIAGNOSIS — M542 Cervicalgia: Secondary | ICD-10-CM | POA: Diagnosis not present

## 2016-02-11 ENCOUNTER — Encounter: Payer: Self-pay | Admitting: Family Medicine

## 2016-02-11 ENCOUNTER — Ambulatory Visit (INDEPENDENT_AMBULATORY_CARE_PROVIDER_SITE_OTHER): Payer: PPO | Admitting: Family Medicine

## 2016-02-11 VITALS — BP 118/70 | HR 78 | Temp 97.9°F | Resp 18 | Ht 70.5 in | Wt 198.0 lb

## 2016-02-11 DIAGNOSIS — Z23 Encounter for immunization: Secondary | ICD-10-CM

## 2016-02-11 DIAGNOSIS — I499 Cardiac arrhythmia, unspecified: Secondary | ICD-10-CM | POA: Diagnosis not present

## 2016-02-11 DIAGNOSIS — I498 Other specified cardiac arrhythmias: Secondary | ICD-10-CM

## 2016-02-11 DIAGNOSIS — R008 Other abnormalities of heart beat: Secondary | ICD-10-CM | POA: Diagnosis not present

## 2016-02-11 LAB — CBC WITH DIFFERENTIAL/PLATELET
BASOS PCT: 1 %
Basophils Absolute: 84 cells/uL (ref 0–200)
EOS PCT: 3 %
Eosinophils Absolute: 252 cells/uL (ref 15–500)
HEMATOCRIT: 39.6 % (ref 38.5–50.0)
HEMOGLOBIN: 13.6 g/dL (ref 13.0–17.0)
LYMPHS ABS: 2016 {cells}/uL (ref 850–3900)
Lymphocytes Relative: 24 %
MCH: 30.8 pg (ref 27.0–33.0)
MCHC: 34.3 g/dL (ref 32.0–36.0)
MCV: 89.6 fL (ref 80.0–100.0)
MONO ABS: 1008 {cells}/uL — AB (ref 200–950)
MPV: 9.3 fL (ref 7.5–12.5)
Monocytes Relative: 12 %
NEUTROS ABS: 5040 {cells}/uL (ref 1500–7800)
Neutrophils Relative %: 60 %
Platelets: 342 10*3/uL (ref 140–400)
RBC: 4.42 MIL/uL (ref 4.20–5.80)
RDW: 12.6 % (ref 11.0–15.0)
WBC: 8.4 10*3/uL (ref 3.8–10.8)

## 2016-02-11 LAB — TSH: TSH: 2.18 m[IU]/L (ref 0.40–4.50)

## 2016-02-11 NOTE — Progress Notes (Signed)
Subjective:    Patient ID: Tyler Allison, male    DOB: [birth year], 75 y.o.   MRN: [id number]  HPI On September 22, the patient underwent elective ACDF for neck pain. In the postoperative period, he had several runs of ventricular bigeminy. Anesthesiology was kind enough to send a copy of a strip obtained during that time which does confirm atrial bigeminy. Per the doctor's note that he sent me runs of bigeminy with last several seconds and would recur in regular intervals every 1-2 minutes. He was monitored in recovery and after a few hours he became less frequent. The patient was also asymptomatic. He was discharged home. The anesthesiologist, Dr. Madalyn Rob, was kind enough to go to the patient's house that night afterwards and check on him just to be certain which I really think is remarkable. Throughout the entire time, the patient was completely asymptomatic. He denies any palpitations. He denies any syncope. He denies any chest pain or shortness of breath or dyspnea on exertion. Today he is in normal sinus rhythm. He is here today for follow-up Past Medical History:  Diagnosis Date  . Allergy   . Asthma   . Cervical spondylosis with radiculopathy    left c6 radiculopathy  . Chronic back pain   . GERD (gastroesophageal reflux disease)   . Hyperlipidemia   . Hypertension    No past surgical history on file. Current Outpatient Prescriptions on File Prior to Visit  Medication Sig Dispense Refill  . albuterol (PROAIR HFA) 108 (90 BASE) MCG/ACT inhaler Inhale 2 puffs into the lungs every 4 (four) hours as needed. 8.5 g 3  . aspirin 81 MG tablet Take 81 mg by mouth. 2 - 3 times per week    . cholecalciferol (VITAMIN D) 1000 UNITS tablet Take 1,000 Units by mouth daily.    Marland Kitchen levocetirizine (XYZAL) 5 MG tablet TAKE 1 BY MOUTH EVERY EVENING 90 tablet 3  . metoprolol succinate (TOPROL-XL) 25 MG 24 hr tablet TAKE 1 BY MOUTH DAILY 90 tablet 3  . Multiple Vitamins-Minerals (OCUVITE PRESERVISION PO)  Take by mouth.    . Omega-3 Fatty Acids (FISH OIL) 1200 MG CAPS Take 1,200-2,400 mg by mouth 2 (two) times daily. Takes 1200 mg in the morning and 2400 mg in the evening    . omeprazole (PRILOSEC) 40 MG capsule Take 1 capsule (40 mg total) by mouth daily. 90 capsule 2  . tamsulosin (FLOMAX) 0.4 MG CAPS capsule Take 1 capsule by mouth daily.  2  . traMADol (ULTRAM) 50 MG tablet Take by mouth every 6 (six) hours as needed.     No current facility-administered medications on file prior to visit.    Allergies  Allergen Reactions  . Codeine Nausea And Vomiting   Social History   Social History  . Marital status: Married    Spouse name: N/A  . Number of children: N/A  . Years of education: N/A   Occupational History  . Not on file.   Social History Main Topics  . Smoking status: Never Smoker  . Smokeless tobacco: Never Used  . Alcohol use No  . Drug use: No  . Sexual activity: Not on file     Comment: married, retired.   Other Topics Concern  . Not on file   Social History Narrative  . No narrative on file      Review of Systems  All other systems reviewed and are negative.      Objective:   Physical Exam  Constitutional: He is oriented to person, place, and time. He appears well-developed and well-nourished. No distress.  Neck: Neck supple. No JVD present. No thyromegaly present.  Cardiovascular: Normal rate, regular rhythm and normal heart sounds.  Exam reveals no gallop and no friction rub.   No murmur heard. Pulmonary/Chest: Effort normal and breath sounds normal. No respiratory distress. He has no wheezes. He has no rales.  Abdominal: Soft. Bowel sounds are normal. He exhibits no distension. There is no tenderness. There is no rebound and no guarding.  Neurological: He is alert and oriented to person, place, and time. No cranial nerve deficit. He exhibits normal muscle tone. Coordination normal.  Skin: He is not diaphoretic.  Vitals reviewed.           Assessment & Plan:  Bigeminy - Plan: CBC with Differential/Platelet, COMPLETE METABOLIC PANEL WITH GFR, TSH  I believe this transient phenomenon was a combination of the patient's age, the stress of surgery, and also medications required for anesthesia all contributing to create isolated transient self-limited runs of bigeminy that have now apparently ceased. Patient is inquiring whether he needs to see a cardiologist. I explained to the patient that if he is asymptomatic and this is not occurring any longer but I do not believe he needs to see a cardiologist. I will perform an echocardiogram to evaluate for structural or valvular heart disease. I will have the patient wear a 24-hour Holter monitor to monitor for significant cardiac arrhythmias. I will also check a CBC, CMP, TSH to evaluate for any metabolic or endocrinologic problems that can contribute to PVCs. Obviously if any of this workup is abnormal, we'll pursue that further.

## 2016-02-11 NOTE — Addendum Note (Signed)
Addended by: Shary Decamp B on: 02/11/2016 03:56 PM   Modules accepted: Orders

## 2016-02-12 ENCOUNTER — Ambulatory Visit: Payer: PPO

## 2016-02-12 DIAGNOSIS — I498 Other specified cardiac arrhythmias: Secondary | ICD-10-CM

## 2016-02-12 DIAGNOSIS — I499 Cardiac arrhythmia, unspecified: Principal | ICD-10-CM

## 2016-02-12 LAB — COMPLETE METABOLIC PANEL WITH GFR
ALBUMIN: 3.5 g/dL — AB (ref 3.6–5.1)
ALK PHOS: 82 U/L (ref 40–115)
ALT: 12 U/L (ref 9–46)
AST: 20 U/L (ref 10–35)
BILIRUBIN TOTAL: 0.5 mg/dL (ref 0.2–1.2)
BUN: 19 mg/dL (ref 7–25)
CALCIUM: 8.9 mg/dL (ref 8.6–10.3)
CO2: 26 mmol/L (ref 20–31)
Chloride: 98 mmol/L (ref 98–110)
Creat: 0.88 mg/dL (ref 0.70–1.18)
GFR, Est African American: >89 mL/min (ref >=60)
GFR, Est Non African American: 83 mL/min (ref >=60)
GLUCOSE: 83 mg/dL (ref 70–99)
Potassium: 4.6 mmol/L (ref 3.5–5.3)
SODIUM: 134 mmol/L — AB (ref 135–146)
Total Protein: 6.5 g/dL (ref 6.1–8.1)

## 2016-02-20 ENCOUNTER — Encounter: Payer: Self-pay | Admitting: Family Medicine

## 2016-02-23 ENCOUNTER — Encounter: Payer: Self-pay | Admitting: Family Medicine

## 2016-02-23 ENCOUNTER — Ambulatory Visit (INDEPENDENT_AMBULATORY_CARE_PROVIDER_SITE_OTHER): Payer: PPO | Admitting: Family Medicine

## 2016-02-23 VITALS — BP 110/72 | HR 78 | Temp 97.6°F | Resp 18 | Ht 70.5 in | Wt 199.0 lb

## 2016-02-23 DIAGNOSIS — I480 Paroxysmal atrial fibrillation: Secondary | ICD-10-CM | POA: Diagnosis not present

## 2016-02-23 NOTE — Progress Notes (Signed)
Subjective:    Patient ID: Tyler Allison, male    DOB: 07/06/1940, 75 y.o.   MRN: [id number]  HPI  02/11/16 On September 22, the patient underwent elective ACDF for neck pain. In the postoperative period, he had several runs of ventricular bigeminy. Anesthesiology was kind enough to send a copy of a strip obtained during that time which does confirm atrial bigeminy. Per the doctor's note that he sent me runs of bigeminy with last several seconds and would recur in regular intervals every 1-2 minutes. He was monitored in recovery and after a few hours he became less frequent. The patient was also asymptomatic. He was discharged home. The anesthesiologist, Dr. Madalyn Rob, was kind enough to go to the patient's house that night afterwards and check on him just to be certain which I really think is remarkable. Throughout the entire time, the patient was completely asymptomatic. He denies any palpitations. He denies any syncope. He denies any chest pain or shortness of breath or dyspnea on exertion. Today he is in normal sinus rhythm. He is here today for follow-up.  At that time, my plan was: I believe this transient phenomenon was a combination of the patient's age, the stress of surgery, and also medications required for anesthesia all contributing to create isolated transient self-limited runs of bigeminy that have now apparently ceased. Patient is inquiring whether he needs to see a cardiologist. I explained to the patient that if he is asymptomatic and this is not occurring any longer but I do not believe he needs to see a cardiologist. I will perform an echocardiogram to evaluate for structural or valvular heart disease. I will have the patient wear a 24-hour Holter monitor to monitor for significant cardiac arrhythmias. I will also check a CBC, CMP, TSH to evaluate for any metabolic or endocrinologic problems that can contribute to PVCs. Obviously if any of this workup is abnormal, we'll pursue that  further.  02/23/16 Patient's lab work returned normal. However his Holter monitor showed several abnormalities. #1 it did show ventricular bigeminy on several occasions. He is also having frequent PVCs. However he did have a run of A. fib with a rapid heart rate. There was no discernible P waves and the rhythm was irregularly irregular. He is here today to discuss further. Today on examination he is back in sinus rhythm Past Medical History:  Diagnosis Date  . Allergy   . Asthma   . Cervical spondylosis with radiculopathy    left c6 radiculopathy  . Chronic back pain   . GERD (gastroesophageal reflux disease)   . Hyperlipidemia   . Hypertension    No past surgical history on file. Current Outpatient Prescriptions on File Prior to Visit  Medication Sig Dispense Refill  . albuterol (PROAIR HFA) 108 (90 BASE) MCG/ACT inhaler Inhale 2 puffs into the lungs every 4 (four) hours as needed. 8.5 g 3  . aspirin 81 MG tablet Take 81 mg by mouth. 2 - 3 times per week    . cholecalciferol (VITAMIN D) 1000 UNITS tablet Take 1,000 Units by mouth daily.    Marland Kitchen levocetirizine (XYZAL) 5 MG tablet TAKE 1 BY MOUTH EVERY EVENING 90 tablet 3  . metoprolol succinate (TOPROL-XL) 25 MG 24 hr tablet TAKE 1 BY MOUTH DAILY 90 tablet 3  . Multiple Vitamins-Minerals (OCUVITE PRESERVISION PO) Take by mouth.    . Omega-3 Fatty Acids (FISH OIL) 1200 MG CAPS Take 1,200-2,400 mg by mouth 2 (two) times daily. Takes 1200 mg  in the morning and 2400 mg in the evening    . omeprazole (PRILOSEC) 40 MG capsule Take 1 capsule (40 mg total) by mouth daily. 90 capsule 2  . tamsulosin (FLOMAX) 0.4 MG CAPS capsule Take 1 capsule by mouth daily.  2  . traMADol (ULTRAM) 50 MG tablet Take by mouth every 6 (six) hours as needed.     No current facility-administered medications on file prior to visit.    Allergies  Allergen Reactions  . Codeine Nausea And Vomiting   Social History   Social History  . Marital status: Married     Spouse name: N/A  . Number of children: N/A  . Years of education: N/A   Occupational History  . Not on file.   Social History Main Topics  . Smoking status: Never Smoker  . Smokeless tobacco: Never Used  . Alcohol use No  . Drug use: No  . Sexual activity: Not on file     Comment: married, retired.   Other Topics Concern  . Not on file   Social History Narrative  . No narrative on file      Review of Systems  All other systems reviewed and are negative.      Objective:   Physical Exam  Constitutional: He is oriented to person, place, and time. He appears well-developed and well-nourished. No distress.  Neck: Neck supple. No JVD present. No thyromegaly present.  Cardiovascular: Normal rate, regular rhythm and normal heart sounds.  Exam reveals no gallop and no friction rub.   No murmur heard. Pulmonary/Chest: Effort normal and breath sounds normal. No respiratory distress. He has no wheezes. He has no rales.  Abdominal: Soft. Bowel sounds are normal. He exhibits no distension. There is no tenderness. There is no rebound and no guarding.  Neurological: He is alert and oriented to person, place, and time. No cranial nerve deficit. He exhibits normal muscle tone. Coordination normal.  Skin: He is not diaphoretic.  Vitals reviewed.         Assessment & Plan:  Paroxysmal atrial fibrillation ([place]) - Plan: Ambulatory referral to Cardiology  Holter monitor showed paroxysmal atrial fibrillation. I will have the patient discontinue aspirin. I will replace it with xarelto 20 mg poqday. I will consult cardiology. He has an echocardiogram scheduled for tomorrow. His lab work is normal. I recommended he avoid all NSAIDs. At the present time he is back in normal sinus rhythm. I did give the patient a copy of the Holter monitor results to take to  The cardiologist for their review

## 2016-02-24 ENCOUNTER — Other Ambulatory Visit: Payer: Self-pay

## 2016-02-24 ENCOUNTER — Ambulatory Visit (HOSPITAL_COMMUNITY): Payer: PPO | Attending: Family Medicine

## 2016-02-24 DIAGNOSIS — I5030 Unspecified diastolic (congestive) heart failure: Secondary | ICD-10-CM | POA: Insufficient documentation

## 2016-02-24 DIAGNOSIS — I499 Cardiac arrhythmia, unspecified: Secondary | ICD-10-CM

## 2016-02-24 DIAGNOSIS — I083 Combined rheumatic disorders of mitral, aortic and tricuspid valves: Secondary | ICD-10-CM | POA: Insufficient documentation

## 2016-02-24 DIAGNOSIS — I498 Other specified cardiac arrhythmias: Secondary | ICD-10-CM

## 2016-02-25 ENCOUNTER — Telehealth: Payer: Self-pay | Admitting: Cardiology

## 2016-02-26 NOTE — Telephone Encounter (Signed)
Closed encounter °

## 2016-03-08 ENCOUNTER — Encounter: Payer: Self-pay | Admitting: Interventional Cardiology

## 2016-03-15 ENCOUNTER — Telehealth: Payer: Self-pay | Admitting: Family Medicine

## 2016-03-15 NOTE — Telephone Encounter (Signed)
Yes, stay on med.

## 2016-03-15 NOTE — Telephone Encounter (Signed)
Pt has an appt with Cardiologist on 03/22/16 and wants to know if he needs to stay on Xeralto until that appt - if so he will be out of samples and will need RX or if you are going to change it he needs a RX?!

## 2016-03-15 NOTE — Telephone Encounter (Signed)
Called pt and he states that he will have enough samples to get him to his card appt next week and cards will decide at that point as to keep him on it or change med. Pt verbalizes understanding.

## 2016-03-22 ENCOUNTER — Encounter: Payer: Self-pay | Admitting: Interventional Cardiology

## 2016-03-22 ENCOUNTER — Ambulatory Visit (INDEPENDENT_AMBULATORY_CARE_PROVIDER_SITE_OTHER): Payer: PPO | Admitting: Interventional Cardiology

## 2016-03-22 VITALS — BP 134/70 | HR 67 | Ht 70.5 in | Wt 204.8 lb

## 2016-03-22 DIAGNOSIS — E7800 Pure hypercholesterolemia, unspecified: Secondary | ICD-10-CM | POA: Diagnosis not present

## 2016-03-22 DIAGNOSIS — M542 Cervicalgia: Secondary | ICD-10-CM | POA: Diagnosis not present

## 2016-03-22 DIAGNOSIS — I480 Paroxysmal atrial fibrillation: Secondary | ICD-10-CM | POA: Diagnosis not present

## 2016-03-22 MED ORDER — RIVAROXABAN 20 MG PO TABS: 20.0000 mg | ORAL_TABLET | Freq: Every day | ORAL | 1 refills | Status: DC

## 2016-03-22 NOTE — Patient Instructions (Signed)
**Note De-Identified Brendia Dampier Obfuscation** Medication Instructions:  Same-no changes  Labwork: None  Testing/Procedures: None  Follow-Up: In 3 to 4 months     If you need a refill on your cardiac medications before your next appointment, please call your pharmacy.

## 2016-03-22 NOTE — Progress Notes (Addendum)
Cardiology Office Note   Date:  03/22/2016   ID:  Rigdon, Recchia 04/19/1941, MRN [id number]  PCP:  Odette Fraction, MD    No chief complaint on file. AFib   Wt Readings from Last 3 Encounters:  03/22/16 92.9 kg (204 lb 12.8 oz)  02/23/16 90.3 kg (199 lb)  02/11/16 89.8 kg (198 lb)       History of Present Illness: Tyler Allison is a 75 y.o. male  Who had neck surgery on 01/27/16.  The anesthesia MD mentioned some irregular heart rhythm.  Dr. Dennard Schaumann did a Holter and this showed PAF 15 days post op. He had some bradycardia at 3AM, presumably during sleep, (2 second pause).   He has had minimal fluttering feeling.  No significant palpitations, chest pain or SHOB.  He remains active.  He has been started on Xarelto and has no bleeding problems.    AFib rate has been as high as 142 bpm.  Even with that, he did not have sx.     Past Medical History:  Diagnosis Date  . Allergy   . Asthma   . Cervical spondylosis with radiculopathy    left c6 radiculopathy  . Chronic back pain   . GERD (gastroesophageal reflux disease)   . Hyperlipidemia   . Hypertension     No past surgical history on file.   Current Outpatient Prescriptions  Medication Sig Dispense Refill  . albuterol (PROAIR HFA) 108 (90 BASE) MCG/ACT inhaler Inhale 2 puffs into the lungs every 4 (four) hours as needed. 8.5 g 3  . aspirin 81 MG tablet Take 81 mg by mouth. 2 - 3 times per week    . cholecalciferol (VITAMIN D) 1000 UNITS tablet Take 1,000 Units by mouth daily.    Marland Kitchen levocetirizine (XYZAL) 5 MG tablet TAKE 1 BY MOUTH EVERY EVENING 90 tablet 3  . metoprolol succinate (TOPROL-XL) 25 MG 24 hr tablet TAKE 1 BY MOUTH DAILY 90 tablet 3  . Multiple Vitamins-Minerals (OCUVITE PRESERVISION PO) Take by mouth.    . Omega-3 Fatty Acids (FISH OIL) 1200 MG CAPS Take 1,200-2,400 mg by mouth 2 (two) times daily. Takes 1200 mg in the morning and 2400 mg in the evening    . omeprazole (PRILOSEC) 40 MG capsule  Take 1 capsule (40 mg total) by mouth daily. 90 capsule 2  . rivaroxaban (XARELTO) 20 MG TABS tablet Take 20 mg by mouth daily with supper.    . tamsulosin (FLOMAX) 0.4 MG CAPS capsule Take 1 capsule by mouth daily.  2  . traMADol (ULTRAM) 50 MG tablet Take by mouth every 6 (six) hours as needed.     No current facility-administered medications for this visit.     Allergies:   Codeine    Social History:  The patient  reports that he has never smoked. He has never used smokeless tobacco. He reports that he does not drink alcohol or use drugs.   Family History:  The patient's family history is not on file.  No early CAD.   ROS:  Please see the history of present illness.   Otherwise, review of systems are positive for minimal palpitations.   All other systems are reviewed and negative.    PHYSICAL EXAM: VS:  BP 134/70   Pulse 67   Ht 5' 10.5" (1.791 m)   Wt 92.9 kg (204 lb 12.8 oz)   BMI 28.97 kg/m  , BMI Body mass index is 28.97 kg/m. GEN: Well  nourished, well developed, in no acute distress  HEENT: normal  Neck: no JVD, carotid bruits, or masses Cardiac: RRR; no murmurs, rubs, or gallops,no edema  Respiratory:  clear to auscultation bilaterally, normal work of breathing GI: soft, nontender, nondistended, + BS MS: no deformity or atrophy  Skin: warm and dry, no rash Neuro:  Strength and sensation are intact Psych: euthymic mood, full affect   EKG:   The ekg ordered today demonstrates NSR, no ST segment changes   Recent Labs: 02/11/2016: ALT 12; BUN 19; Creat 0.88; Hemoglobin 13.6; Platelets 342; Potassium 4.6; Sodium 134; TSH 2.18   Lipid Panel    Component Value Date/Time   CHOL 144 03/13/2015 0816   TRIG 153 (H) 03/13/2015 0816   HDL 49 03/13/2015 0816   CHOLHDL 2.9 03/13/2015 0816   VLDL 31 (H) 03/13/2015 0816   LDLCALC 64 03/13/2015 0816     Other studies Reviewed: Additional studies/ records that were reviewed today with results demonstrating: Normal LV  function on recent echo.   ASSESSMENT AND PLAN:  1. AFib:  Now in NSR.  AFib occurred more than 2 weeks post op.  I think it is reasonable to continue longterm anticoagulation at this time.  Rx for Xarelto given. We discussed AFib at length and the possible consequences.  2. Hyperlipidemia: Continue fish oil.   This patients CHA2DS2-VASc Score and unadjusted Ischemic Stroke Rate (% per year) is equal to 3.2 % stroke rate/year from a score of 3 (counting HTN mentioned in problem list)  Above score calculated as 1 point each if present [CHF, HTN, DM, Vascular=MI/PAD/Aortic Plaque, Age if 65-74, or Male] Above score calculated as 2 points each if present [Age > 75, or Stroke/TIA/TE]    Current medicines are reviewed at length with the patient today.  The patient concerns regarding his medicines were addressed.  The following changes have been made:  No change  Labs/ tests ordered today include:  No orders of the defined types were placed in this encounter.   Recommend 150 minutes/week of aerobic exercise Low fat, low carb, high fiber diet recommended  Disposition:   FU in 3-4 months   Signed, Larae Grooms, MD  03/22/2016 3:35 PM    [facility] [place], [place], [place]  [address] Phone: [phone]; Fax: [phone]

## 2016-04-04 DIAGNOSIS — L814 Other melanin hyperpigmentation: Secondary | ICD-10-CM | POA: Diagnosis not present

## 2016-04-04 DIAGNOSIS — D1801 Hemangioma of skin and subcutaneous tissue: Secondary | ICD-10-CM | POA: Diagnosis not present

## 2016-04-04 DIAGNOSIS — L821 Other seborrheic keratosis: Secondary | ICD-10-CM | POA: Diagnosis not present

## 2016-04-04 DIAGNOSIS — D225 Melanocytic nevi of trunk: Secondary | ICD-10-CM | POA: Diagnosis not present

## 2016-04-04 DIAGNOSIS — L570 Actinic keratosis: Secondary | ICD-10-CM | POA: Diagnosis not present

## 2016-04-04 DIAGNOSIS — Z85828 Personal history of other malignant neoplasm of skin: Secondary | ICD-10-CM | POA: Diagnosis not present

## 2016-04-04 DIAGNOSIS — B353 Tinea pedis: Secondary | ICD-10-CM | POA: Diagnosis not present

## 2016-04-04 DIAGNOSIS — D2271 Melanocytic nevi of right lower limb, including hip: Secondary | ICD-10-CM | POA: Diagnosis not present

## 2016-05-10 DIAGNOSIS — M79602 Pain in left arm: Secondary | ICD-10-CM | POA: Insufficient documentation

## 2016-05-18 DIAGNOSIS — J018 Other acute sinusitis: Secondary | ICD-10-CM | POA: Diagnosis not present

## 2016-05-18 DIAGNOSIS — H66001 Acute suppurative otitis media without spontaneous rupture of ear drum, right ear: Secondary | ICD-10-CM | POA: Diagnosis not present

## 2016-05-18 DIAGNOSIS — H6501 Acute serous otitis media, right ear: Secondary | ICD-10-CM | POA: Diagnosis not present

## 2016-05-23 ENCOUNTER — Telehealth: Payer: Self-pay | Admitting: Family Medicine

## 2016-05-23 NOTE — Telephone Encounter (Signed)
Called and spoke to pt and Dr, Lucia Gaskins ENT give this to him. He states that he has had watery diarrhea x 4-5 days and some wheezing which is on the side effect profile according to him. He states that he was give that for an ear infection. Due to it being from another MD I recommended that he contact him to let him know what is going on in case he would like to put him a different antibx. Pt agreed and will call Dr. Lucia Gaskins

## 2016-05-23 NOTE — Telephone Encounter (Signed)
cb [phone], pt was seen at specialist and thinks he may be having reaction to med augmentin, diarrhea.

## 2016-05-27 DIAGNOSIS — R972 Elevated prostate specific antigen [PSA]: Secondary | ICD-10-CM | POA: Diagnosis not present

## 2016-06-01 DIAGNOSIS — R351 Nocturia: Secondary | ICD-10-CM | POA: Diagnosis not present

## 2016-06-01 DIAGNOSIS — R972 Elevated prostate specific antigen [PSA]: Secondary | ICD-10-CM | POA: Diagnosis not present

## 2016-06-01 DIAGNOSIS — N401 Enlarged prostate with lower urinary tract symptoms: Secondary | ICD-10-CM | POA: Diagnosis not present

## 2016-06-20 DIAGNOSIS — M7712 Lateral epicondylitis, left elbow: Secondary | ICD-10-CM | POA: Diagnosis not present

## 2016-06-20 DIAGNOSIS — M7542 Impingement syndrome of left shoulder: Secondary | ICD-10-CM | POA: Diagnosis not present

## 2016-07-01 ENCOUNTER — Ambulatory Visit (INDEPENDENT_AMBULATORY_CARE_PROVIDER_SITE_OTHER): Payer: PPO | Admitting: Interventional Cardiology

## 2016-07-01 ENCOUNTER — Encounter: Payer: Self-pay | Admitting: Interventional Cardiology

## 2016-07-01 ENCOUNTER — Encounter (INDEPENDENT_AMBULATORY_CARE_PROVIDER_SITE_OTHER): Payer: Self-pay

## 2016-07-01 DIAGNOSIS — I480 Paroxysmal atrial fibrillation: Secondary | ICD-10-CM | POA: Insufficient documentation

## 2016-07-01 DIAGNOSIS — E782 Mixed hyperlipidemia: Secondary | ICD-10-CM

## 2016-07-01 DIAGNOSIS — E785 Hyperlipidemia, unspecified: Secondary | ICD-10-CM | POA: Insufficient documentation

## 2016-07-01 NOTE — Progress Notes (Signed)
Cardiology Office Note   Date:  07/01/2016   ID:  Janzen, Klebe 1940-07-11, MRN [id number]  PCP:  Odette Fraction, MD    No chief complaint on file. AFib   Wt Readings from Last 3 Encounters:  07/01/16 211 lb 1.9 oz (95.8 kg)  03/22/16 204 lb 12.8 oz (92.9 kg)  02/23/16 199 lb (90.3 kg)       History of Present Illness: Tyler Allison is a 76 y.o. male  Who had neck surgery on 01/27/16.  The anesthesia MD mentioned some irregular heart rhythm.  Dr. Dennard Schaumann did a Holter and this showed PAF 15 days post op. He had some bradycardia at 3AM, presumably during sleep, (2 second pause).   He has had minimal fluttering feeling.  No significant palpitations, chest pain or SHOB.  He remains active.  He has been started on Xarelto and has no bleeding problems.    AFib rate has been as high as 142 bpm.  Even with that, he did not have sx.    He ha some nuisance bleeding.  He no longer takes aspirin.   He has shoulder problems.  He recently had an injection for bursitis.  No problems with bleeding.      Past Medical History:  Diagnosis Date  . Allergy   . Asthma   . Cervical spondylosis with radiculopathy    left c6 radiculopathy  . Chronic back pain   . GERD (gastroesophageal reflux disease)   . Hyperlipidemia   . Hypertension     No past surgical history on file.   Current Outpatient Prescriptions  Medication Sig Dispense Refill  . albuterol (PROAIR HFA) 108 (90 BASE) MCG/ACT inhaler Inhale 2 puffs into the lungs every 4 (four) hours as needed. 8.5 g 3  . aspirin 81 MG tablet Take 81 mg by mouth. 2 - 3 times per week    . cholecalciferol (VITAMIN D) 1000 UNITS tablet Take 1,000 Units by mouth daily.    Marland Kitchen levocetirizine (XYZAL) 5 MG tablet TAKE 1 BY MOUTH EVERY EVENING 90 tablet 3  . metoprolol succinate (TOPROL-XL) 25 MG 24 hr tablet TAKE 1 BY MOUTH DAILY 90 tablet 3  . Multiple Vitamins-Minerals (OCUVITE PRESERVISION PO) Take by mouth.    . Omega-3 Fatty Acids  (FISH OIL) 1200 MG CAPS Take 1,200-2,400 mg by mouth 2 (two) times daily. Takes 1200 mg in the morning and 2400 mg in the evening    . omeprazole (PRILOSEC) 40 MG capsule Take 1 capsule (40 mg total) by mouth daily. 90 capsule 2  . rivaroxaban (XARELTO) 20 MG TABS tablet Take 1 tablet (20 mg total) by mouth daily with supper. 90 tablet 1  . tamsulosin (FLOMAX) 0.4 MG CAPS capsule Take 1 capsule by mouth daily.  2  . traMADol (ULTRAM) 50 MG tablet Take by mouth every 6 (six) hours as needed.     No current facility-administered medications for this visit.     Allergies:   Codeine    Social History:  The patient  reports that he has never smoked. He has never used smokeless tobacco. He reports that he does not drink alcohol or use drugs.   Family History:  The patient's family history is not on file.  No early CAD.   ROS:  Please see the history of present illness.   Otherwise, review of systems are positive for minimal palpitations.   All other systems are reviewed and negative.    PHYSICAL EXAM:  VS:  BP 134/70 (BP Location: Left Arm, Patient Position: Sitting, Cuff Size: Normal)   Pulse 64   Ht 5' 10.5" (1.791 m)   Wt 211 lb 1.9 oz (95.8 kg)   SpO2 96%   BMI 29.86 kg/m  , BMI Body mass index is 29.86 kg/m. GEN: Well nourished, well developed, in no acute distress  HEENT: normal  Neck: no JVD, carotid bruits, or masses Cardiac: RRR; no murmurs, rubs, or gallops,no edema  Respiratory:  clear to auscultation bilaterally, normal work of breathing GI: soft, nontender, nondistended, + BS MS: no deformity or atrophy  Skin: warm and dry, no rash Neuro:  Strength and sensation are intact Psych: euthymic mood, full affect   EKG:   The ekg ordered today demonstrates NSR, no ST segment changes   Recent Labs: 02/11/2016: ALT 12; BUN 19; Creat 0.88; Hemoglobin 13.6; Platelets 342; Potassium 4.6; Sodium 134; TSH 2.18   Lipid Panel    Component Value Date/Time   CHOL 144 03/13/2015  0816   TRIG 153 (H) 03/13/2015 0816   HDL 49 03/13/2015 0816   CHOLHDL 2.9 03/13/2015 0816   VLDL 31 (H) 03/13/2015 0816   LDLCALC 64 03/13/2015 0816     Other studies Reviewed: Additional studies/ records that were reviewed today with results demonstrating: Normal LV function on recent echo.   ASSESSMENT AND PLAN:  1. AFib:  Now in NSR.  AFib occurred more than 2 weeks post op.  No further sx of AFib.   I think it is reasonable to continue longterm anticoagulation at this time as the benefits outweigh the risks.  COntinue Xarelto. We discussed AFib at length and the possible consequences including stroke.   2. Hyperlipidemia: Continue fish oil.  Continue regular exercise. This patients CHA2DS2-VASc Score and unadjusted Ischemic Stroke Rate (% per year) is equal to 3.2 % stroke rate/year from a score of 3 (counting HTN mentioned in problem list)  Above score calculated as 1 point each if present [CHF, HTN, DM, Vascular=MI/PAD/Aortic Plaque, Age if 65-74, or Male] Above score calculated as 2 points each if present [Age > 75, or Stroke/TIA/TE]    Current medicines are reviewed at length with the patient today.  The patient concerns regarding his medicines were addressed.  The following changes have been made:  No change  Labs/ tests ordered today include:  No orders of the defined types were placed in this encounter.   Recommend 150 minutes/week of aerobic exercise Low fat, low carb, high fiber diet recommended  Disposition:   FU in 3-4 months   Signed, Larae Grooms, MD  07/01/2016 1:35 PM    [facility] [place], [place], [place]  [address] Phone: [phone]; Fax: [phone]

## 2016-07-01 NOTE — Patient Instructions (Signed)

## 2016-07-25 ENCOUNTER — Encounter: Payer: Self-pay | Admitting: Family Medicine

## 2016-07-25 ENCOUNTER — Ambulatory Visit (INDEPENDENT_AMBULATORY_CARE_PROVIDER_SITE_OTHER): Payer: PPO | Admitting: Family Medicine

## 2016-07-25 ENCOUNTER — Telehealth: Payer: Self-pay | Admitting: Family Medicine

## 2016-07-25 VITALS — BP 100/56 | HR 88 | Temp 99.3°F | Resp 18 | Ht 70.5 in | Wt 215.0 lb

## 2016-07-25 DIAGNOSIS — R509 Fever, unspecified: Secondary | ICD-10-CM

## 2016-07-25 DIAGNOSIS — R300 Dysuria: Secondary | ICD-10-CM

## 2016-07-25 DIAGNOSIS — N410 Acute prostatitis: Secondary | ICD-10-CM | POA: Diagnosis not present

## 2016-07-25 LAB — URINALYSIS, ROUTINE W REFLEX MICROSCOPIC
Bilirubin Urine: NEGATIVE
Ketones, ur: NEGATIVE
NITRITE: POSITIVE — AB
Protein, ur: NEGATIVE
SPECIFIC GRAVITY, URINE: 1.005 (ref 1.001–1.035)
pH: 5 (ref 5.0–8.0)

## 2016-07-25 LAB — URINALYSIS, MICROSCOPIC ONLY
CRYSTALS: NONE SEEN [HPF]
Casts: NONE SEEN [LPF]
Yeast: NONE SEEN [HPF]

## 2016-07-25 MED ORDER — CIPROFLOXACIN HCL 500 MG PO TABS: 500.0000 mg | ORAL_TABLET | Freq: Two times a day (BID) | ORAL | 0 refills | Status: DC

## 2016-07-25 NOTE — Progress Notes (Signed)
Subjective:    Patient ID: Tyler Allison, male    DOB: 1941/01/04, 76 y.o.   MRN: [id number]  HPI Symptoms began Saturday. Symptoms consist of dysuria, frequency, urgency, and hesitancy. Patient states that it feels like there's a torch inside him when he voids.  He also reports diffuse body aches, fevers, and chills. On examination today, prostate is +3 in size and extremely tender Past Medical History:  Diagnosis Date  . Allergy   . Asthma   . Cervical spondylosis with radiculopathy    left c6 radiculopathy  . Chronic back pain   . GERD (gastroesophageal reflux disease)   . Hyperlipidemia   . Hypertension    No past surgical history on file. Current Outpatient Prescriptions on File Prior to Visit  Medication Sig Dispense Refill  . albuterol (PROAIR HFA) 108 (90 BASE) MCG/ACT inhaler Inhale 2 puffs into the lungs every 4 (four) hours as needed. 8.5 g 3  . cholecalciferol (VITAMIN D) 1000 UNITS tablet Take 1,000 Units by mouth daily.    Marland Kitchen levocetirizine (XYZAL) 5 MG tablet TAKE 1 BY MOUTH EVERY EVENING 90 tablet 3  . metoprolol succinate (TOPROL-XL) 25 MG 24 hr tablet TAKE 1 BY MOUTH DAILY 90 tablet 3  . Multiple Vitamins-Minerals (OCUVITE PRESERVISION PO) Take by mouth.    . Omega-3 Fatty Acids (FISH OIL) 1200 MG CAPS Take 1,200-2,400 mg by mouth 2 (two) times daily. Takes 1200 mg in the morning and 2400 mg in the evening    . omeprazole (PRILOSEC) 40 MG capsule Take 1 capsule (40 mg total) by mouth daily. 90 capsule 2  . rivaroxaban (XARELTO) 20 MG TABS tablet Take 1 tablet (20 mg total) by mouth daily with supper. 90 tablet 1  . tamsulosin (FLOMAX) 0.4 MG CAPS capsule Take 1 capsule by mouth daily.  2  . traMADol (ULTRAM) 50 MG tablet Take by mouth every 6 (six) hours as needed.     No current facility-administered medications on file prior to visit.    Allergies  Allergen Reactions  . Codeine Nausea And Vomiting   Social History   Social History  . Marital status:  Married    Spouse name: N/A  . Number of children: N/A  . Years of education: N/A   Occupational History  . Not on file.   Social History Main Topics  . Smoking status: Never Smoker  . Smokeless tobacco: Never Used  . Alcohol use No  . Drug use: No  . Sexual activity: Not on file     Comment: married, retired.   Other Topics Concern  . Not on file   Social History Narrative  . No narrative on file       Review of Systems  All other systems reviewed and are negative.      Objective:   Physical Exam  Cardiovascular: Normal rate, regular rhythm and normal heart sounds.   Pulmonary/Chest: Effort normal and breath sounds normal. No respiratory distress. He has no wheezes. He has no rales.  Abdominal: Soft. Bowel sounds are normal. He exhibits no distension. There is no tenderness. There is no rebound and no guarding.  Genitourinary: Prostate is enlarged and tender.  Vitals reviewed.         Assessment & Plan:  Burning with urination - Plan: Urinalysis, Routine w reflex microscopic, Urine culture  Fever and chills - Plan: Urine culture  Acute prostatitis - Plan: ciprofloxacin (CIPRO) 500 MG tablet  I believe the patient has acute prostatitis.  Begin Cipro 500 mg by mouth twice a day for 10 days. Recheck in 10 days or sooner if worse

## 2016-07-25 NOTE — Telephone Encounter (Signed)
Patient called Sunday morning around 1:45 AM he had just went to the restroom experience burning with urination and some dribbling. He states he had some frequency the day before but he does have enlarged prostate which is following with urology for. He denied any abdominal pain no fever no nausea I recommended that if he cannot urinate in the morning to go to the nearest urgent care or ER. He wanted to know if there was something he can use over-the-counter to he could come in for visit advised he could try AZO for the burning sensation  Pt needs appt today

## 2016-07-25 NOTE — Telephone Encounter (Signed)
Pt called and appt made for today at 11 am

## 2016-07-27 ENCOUNTER — Telehealth: Payer: Self-pay | Admitting: Family Medicine

## 2016-07-27 NOTE — Telephone Encounter (Signed)
Pt states that he is better but still very weak and wanted to know if this was normal or if maybe it is coming from the antibx?

## 2016-07-28 LAB — URINE CULTURE

## 2016-07-28 NOTE — Telephone Encounter (Signed)
Likely normal.  If he is better, allow some time for infection to clear.  Abx usually do not really start improving for 48 hrs and it was started Monday.  If worsening, ntbs.

## 2016-07-28 NOTE — Telephone Encounter (Signed)
Pt aware.

## 2016-07-29 DIAGNOSIS — M7712 Lateral epicondylitis, left elbow: Secondary | ICD-10-CM | POA: Diagnosis not present

## 2016-07-29 DIAGNOSIS — M7542 Impingement syndrome of left shoulder: Secondary | ICD-10-CM | POA: Diagnosis not present

## 2016-08-04 ENCOUNTER — Other Ambulatory Visit: Payer: Self-pay | Admitting: Orthopedic Surgery

## 2016-08-04 DIAGNOSIS — M7542 Impingement syndrome of left shoulder: Secondary | ICD-10-CM

## 2016-08-18 ENCOUNTER — Ambulatory Visit
Admission: RE | Admit: 2016-08-18 | Discharge: 2016-08-18 | Disposition: A | Discharge status: Home / Self Care | Payer: PPO | Source: Ambulatory Visit | Attending: Orthopedic Surgery | Admitting: Orthopedic Surgery

## 2016-08-18 DIAGNOSIS — M19012 Primary osteoarthritis, left shoulder: Secondary | ICD-10-CM | POA: Diagnosis not present

## 2016-08-18 DIAGNOSIS — M7542 Impingement syndrome of left shoulder: Secondary | ICD-10-CM

## 2016-08-24 DIAGNOSIS — M19012 Primary osteoarthritis, left shoulder: Secondary | ICD-10-CM | POA: Diagnosis not present

## 2016-08-24 DIAGNOSIS — M25512 Pain in left shoulder: Secondary | ICD-10-CM | POA: Diagnosis not present

## 2016-08-24 DIAGNOSIS — G8929 Other chronic pain: Secondary | ICD-10-CM | POA: Diagnosis not present

## 2016-08-24 DIAGNOSIS — M7542 Impingement syndrome of left shoulder: Secondary | ICD-10-CM | POA: Diagnosis not present

## 2016-08-26 DIAGNOSIS — N401 Enlarged prostate with lower urinary tract symptoms: Secondary | ICD-10-CM | POA: Diagnosis not present

## 2016-08-26 DIAGNOSIS — R102 Pelvic and perineal pain: Secondary | ICD-10-CM | POA: Diagnosis not present

## 2016-08-26 DIAGNOSIS — R351 Nocturia: Secondary | ICD-10-CM | POA: Diagnosis not present

## 2016-09-21 ENCOUNTER — Other Ambulatory Visit: Payer: Self-pay | Admitting: Interventional Cardiology

## 2016-09-21 NOTE — Telephone Encounter (Signed)
Pt last saw Dr Irish Lack  On 07/01/16. Wt 97.5Kg. Age 76 yrs old. 02/11/2016 SCr was 0.88. CrCL is 98.42mL/min.  Xarelto 20mg QD reordered.

## 2016-10-01 DIAGNOSIS — G8929 Other chronic pain: Secondary | ICD-10-CM | POA: Diagnosis not present

## 2016-10-01 DIAGNOSIS — M25512 Pain in left shoulder: Secondary | ICD-10-CM | POA: Diagnosis not present

## 2016-10-07 DIAGNOSIS — M7542 Impingement syndrome of left shoulder: Secondary | ICD-10-CM | POA: Diagnosis not present

## 2016-10-07 DIAGNOSIS — M50322 Other cervical disc degeneration at C5-C6 level: Secondary | ICD-10-CM | POA: Diagnosis not present

## 2016-10-07 DIAGNOSIS — G8929 Other chronic pain: Secondary | ICD-10-CM | POA: Diagnosis not present

## 2016-10-07 DIAGNOSIS — M25512 Pain in left shoulder: Secondary | ICD-10-CM | POA: Diagnosis not present

## 2016-10-12 ENCOUNTER — Other Ambulatory Visit: Payer: Self-pay | Admitting: Family Medicine

## 2016-10-12 DIAGNOSIS — K219 Gastro-esophageal reflux disease without esophagitis: Secondary | ICD-10-CM

## 2016-10-12 MED ORDER — OMEPRAZOLE 40 MG PO CPDR: 40.0000 mg | DELAYED_RELEASE_CAPSULE | Freq: Every day | ORAL | 2 refills | Status: DC

## 2016-10-13 ENCOUNTER — Encounter: Payer: Self-pay | Admitting: Family Medicine

## 2016-10-13 ENCOUNTER — Ambulatory Visit (INDEPENDENT_AMBULATORY_CARE_PROVIDER_SITE_OTHER): Payer: PPO | Admitting: Family Medicine

## 2016-10-13 VITALS — BP 108/68 | HR 68 | Temp 97.7°F | Resp 16 | Ht 70.5 in | Wt 202.0 lb

## 2016-10-13 DIAGNOSIS — R109 Unspecified abdominal pain: Secondary | ICD-10-CM

## 2016-10-13 DIAGNOSIS — R1013 Epigastric pain: Secondary | ICD-10-CM

## 2016-10-13 NOTE — Progress Notes (Signed)
Subjective:    Patient ID: Tyler Allison, male    DOB: Oct 22, 1940, 76 y.o.   MRN: [id number]  HPI Symptoms began last night with bloating in his abdomen in the epigastric area, intermittent crampy abdominal pain, 4 episodes of watery diarrhea, and generalized bloating, and a "sour stomach". The symptoms began after eating an unusual diet yesterday. He had celery and a lot of greens for lunch. He ate several bowls of pinto beans with onions last night.  He denies any nausea or vomiting. He denies any melena or hematochezia. He denies any fevers or chills. He feels some better this morning after the diarrhea. Past Medical History:  Diagnosis Date  . Allergy   . Asthma   . Cervical spondylosis with radiculopathy    left c6 radiculopathy  . Chronic back pain   . GERD (gastroesophageal reflux disease)   . Hyperlipidemia   . Hypertension    No past surgical history on file. Current Outpatient Prescriptions on File Prior to Visit  Medication Sig Dispense Refill  . albuterol (PROAIR HFA) 108 (90 BASE) MCG/ACT inhaler Inhale 2 puffs into the lungs every 4 (four) hours as needed. 8.5 g 3  . cholecalciferol (VITAMIN D) 1000 UNITS tablet Take 1,000 Units by mouth daily.    Marland Kitchen levocetirizine (XYZAL) 5 MG tablet TAKE 1 BY MOUTH EVERY EVENING 90 tablet 3  . metoprolol succinate (TOPROL-XL) 25 MG 24 hr tablet TAKE 1 BY MOUTH DAILY 90 tablet 3  . Multiple Vitamins-Minerals (OCUVITE PRESERVISION PO) Take by mouth.    . Omega-3 Fatty Acids (FISH OIL) 1200 MG CAPS Take 1,200-2,400 mg by mouth 2 (two) times daily. Takes 1200 mg in the morning and 2400 mg in the evening    . omeprazole (PRILOSEC) 40 MG capsule Take 1 capsule (40 mg total) by mouth daily. 90 capsule 2  . tamsulosin (FLOMAX) 0.4 MG CAPS capsule Take 1 capsule by mouth daily.  2  . traMADol (ULTRAM) 50 MG tablet Take by mouth every 6 (six) hours as needed.    Alveda Reasons 20 MG TABS tablet Take 1 tablet by mouth daily with supper 90 tablet 1  .  ciprofloxacin (CIPRO) 500 MG tablet Take 1 tablet (500 mg total) by mouth 2 (two) times daily. (Patient not taking: Reported on 10/13/2016) 20 tablet 0   No current facility-administered medications on file prior to visit.    Allergies  Allergen Reactions  . Codeine Nausea And Vomiting   Social History   Social History  . Marital status: Married    Spouse name: N/A  . Number of children: N/A  . Years of education: N/A   Occupational History  . Not on file.   Social History Main Topics  . Smoking status: Never Smoker  . Smokeless tobacco: Never Used  . Alcohol use No  . Drug use: No  . Sexual activity: Not on file     Comment: married, retired.   Other Topics Concern  . Not on file   Social History Narrative  . No narrative on file      Review of Systems  All other systems reviewed and are negative.      Objective:   Physical Exam  Cardiovascular: Normal rate, regular rhythm and normal heart sounds.   Pulmonary/Chest: Effort normal and breath sounds normal. No respiratory distress. He has no wheezes. He has no rales.  Abdominal: Soft. He exhibits no distension and no mass. Bowel sounds are increased. There is no tenderness.  There is no rebound and no guarding.  Vitals reviewed.         Assessment & Plan:  Dyspepsia  Abdominal cramping  Differential diagnosis includes food poisoning, viral gastroenteritis. However I suspect that the patient simply has dyspepsia secondary to his food choices yesterday. I recommended symptomatic relief with donnatal 1 tab q 6 hrs prn.  Gave him sample with 4 tabs for 24 hours.  If I'm correct, symptoms should be better or resolved by tomorrow, Changing, or worsening, return immediately

## 2016-11-08 ENCOUNTER — Ambulatory Visit (INDEPENDENT_AMBULATORY_CARE_PROVIDER_SITE_OTHER): Payer: PPO | Admitting: Family Medicine

## 2016-11-08 ENCOUNTER — Encounter: Payer: Self-pay | Admitting: Family Medicine

## 2016-11-08 VITALS — BP 120/60 | HR 64 | Temp 98.0°F | Resp 18 | Ht 70.5 in | Wt 208.0 lb

## 2016-11-08 DIAGNOSIS — R1013 Epigastric pain: Secondary | ICD-10-CM

## 2016-11-08 LAB — CBC WITH DIFFERENTIAL/PLATELET
BASOS PCT: 1 %
Basophils Absolute: 58 cells/uL (ref 0–200)
Eosinophils Absolute: 232 cells/uL (ref 15–500)
Eosinophils Relative: 4 %
HEMATOCRIT: 41.8 % (ref 38.5–50.0)
HEMOGLOBIN: 14 g/dL (ref 13.0–17.0)
LYMPHS ABS: 1682 {cells}/uL (ref 850–3900)
Lymphocytes Relative: 29 %
MCH: 30.8 pg (ref 27.0–33.0)
MCHC: 33.5 g/dL (ref 32.0–36.0)
MCV: 91.9 fL (ref 80.0–100.0)
MONO ABS: 696 {cells}/uL (ref 200–950)
MPV: 9.7 fL (ref 7.5–12.5)
Monocytes Relative: 12 %
Neutro Abs: 3132 cells/uL (ref 1500–7800)
Neutrophils Relative %: 54 %
Platelets: 243 10*3/uL (ref 140–400)
RBC: 4.55 MIL/uL (ref 4.20–5.80)
RDW: 13.6 % (ref 11.0–15.0)
WBC: 5.8 10*3/uL (ref 3.8–10.8)

## 2016-11-08 LAB — COMPLETE METABOLIC PANEL WITH GFR
ALBUMIN: 3.8 g/dL (ref 3.6–5.1)
ALK PHOS: 72 U/L (ref 40–115)
ALT: 13 U/L (ref 9–46)
AST: 24 U/L (ref 10–35)
BUN: 14 mg/dL (ref 7–25)
CALCIUM: 9 mg/dL (ref 8.6–10.3)
CO2: 23 mmol/L (ref 20–31)
CREATININE: 0.95 mg/dL (ref 0.70–1.18)
Chloride: 103 mmol/L (ref 98–110)
GFR, Est African American: >89 mL/min (ref >=60)
GFR, Est Non African American: 77 mL/min (ref >=60)
Glucose, Bld: 99 mg/dL (ref 70–99)
Potassium: 4.4 mmol/L (ref 3.5–5.3)
Sodium: 136 mmol/L (ref 135–146)
TOTAL PROTEIN: 6.6 g/dL (ref 6.1–8.1)
Total Bilirubin: 0.4 mg/dL (ref 0.2–1.2)

## 2016-11-08 NOTE — Progress Notes (Signed)
Subjective:    Patient ID: Tyler Allison, male    DOB: 1940/10/03, 76 y.o.   MRN: [id number]  HPI  10/13/16 Symptoms began last night with bloating in his abdomen in the epigastric area, intermittent crampy abdominal pain, 4 episodes of watery diarrhea, and generalized bloating, and a "sour stomach". The symptoms began after eating an unusual diet yesterday. He had celery and a lot of greens for lunch. He ate several bowls of pinto beans with onions last night.  He denies any nausea or vomiting. He denies any melena or hematochezia. He denies any fevers or chills. He feels some better this morning after the diarrhea.  At that time, my plan was: Differential diagnosis includes food poisoning, viral gastroenteritis. However I suspect that the patient simply has dyspepsia secondary to his food choices yesterday. I recommended symptomatic relief with donnatal 1 tab q 6 hrs prn.  Gave him sample with 4 tabs for 24 hours.  If I'm correct, symptoms should be better or resolved by tomorrow, Changing, or worsening, return immediately   11/08/16 Symptoms have not improved. He continues to have a nagging, gnawing pain in his epigastric area and left upper quadrant. His stomach is constantly rumbling. He reports constant bloating. He denies any vomiting. He denies any fevers or chills. He denies any diarrhea. He denies any constipation. There are no exacerbating or alleviating factors. He denies any melena or hematochezia. He denies any hematemesis. Wt Readings from Last 3 Encounters:  11/08/16 208 lb (94.3 kg)  10/13/16 202 lb (91.6 kg)  07/25/16 215 lb (97.5 kg)  The patient does not demonstrate any weight loss  Past Medical History:  Diagnosis Date  . Allergy   . Asthma   . Cervical spondylosis with radiculopathy    left c6 radiculopathy  . Chronic back pain   . GERD (gastroesophageal reflux disease)   . Hyperlipidemia   . Hypertension    No past surgical history on file. Current Outpatient  Prescriptions on File Prior to Visit  Medication Sig Dispense Refill  . albuterol (PROAIR HFA) 108 (90 BASE) MCG/ACT inhaler Inhale 2 puffs into the lungs every 4 (four) hours as needed. 8.5 g 3  . cholecalciferol (VITAMIN D) 1000 UNITS tablet Take 1,000 Units by mouth daily.    Marland Kitchen levocetirizine (XYZAL) 5 MG tablet TAKE 1 BY MOUTH EVERY EVENING 90 tablet 3  . metoprolol succinate (TOPROL-XL) 25 MG 24 hr tablet TAKE 1 BY MOUTH DAILY 90 tablet 3  . Multiple Vitamins-Minerals (OCUVITE PRESERVISION PO) Take by mouth.    . Omega-3 Fatty Acids (FISH OIL) 1200 MG CAPS Take 1,200-2,400 mg by mouth 2 (two) times daily. Takes 1200 mg in the morning and 2400 mg in the evening    . omeprazole (PRILOSEC) 40 MG capsule Take 1 capsule (40 mg total) by mouth daily. 90 capsule 2  . tamsulosin (FLOMAX) 0.4 MG CAPS capsule Take 1 capsule by mouth daily.  2  . traMADol (ULTRAM) 50 MG tablet Take by mouth every 6 (six) hours as needed.    Alveda Reasons 20 MG TABS tablet Take 1 tablet by mouth daily with supper 90 tablet 1   No current facility-administered medications on file prior to visit.    Allergies  Allergen Reactions  . Codeine Nausea And Vomiting   Social History   Social History  . Marital status: Married    Spouse name: N/A  . Number of children: N/A  . Years of education: N/A   Occupational  History  . Not on file.   Social History Main Topics  . Smoking status: Never Smoker  . Smokeless tobacco: Never Used  . Alcohol use No  . Drug use: No  . Sexual activity: Not on file     Comment: married, retired.   Other Topics Concern  . Not on file   Social History Narrative  . No narrative on file      Review of Systems  All other systems reviewed and are negative.      Objective:   Physical Exam  Cardiovascular: Normal rate, regular rhythm and normal heart sounds.   Pulmonary/Chest: Effort normal and breath sounds normal. No respiratory distress. He has no wheezes. He has no rales.    Abdominal: Soft. Bowel sounds are normal. He exhibits no distension and no mass. There is no splenomegaly or hepatomegaly. There is no tenderness. There is no rebound and no guarding.    Vitals reviewed.  Area of discomfort is drawn in a red circle on the diagram       Assessment & Plan:  Dyspepsia - Plan: CBC with Differential/Platelet, COMPLETE METABOLIC PANEL WITH GFR, Lipase, H. pylori breath test  I am concerned about possible gastritis versus peptic ulcer disease. Discontinue omeprazole and replaced with dexilant 60 mg by mouth every morning with food.  Reassess in 2 weeks. Meanwhile obtain CBC, CMP, lipase, H. pylori breath test. If labs are normal and symptoms persist, I would recommend a GI consultation for possible EGD versus imaging of the abdomen.

## 2016-11-09 LAB — LIPASE: LIPASE: 39 U/L (ref 7–60)

## 2016-11-09 LAB — H. PYLORI BREATH TEST: H. pylori Breath Test: NOT DETECTED

## 2016-11-24 DIAGNOSIS — H53002 Unspecified amblyopia, left eye: Secondary | ICD-10-CM | POA: Diagnosis not present

## 2016-11-24 DIAGNOSIS — H5201 Hypermetropia, right eye: Secondary | ICD-10-CM | POA: Diagnosis not present

## 2016-11-24 DIAGNOSIS — H353132 Nonexudative age-related macular degeneration, bilateral, intermediate dry stage: Secondary | ICD-10-CM | POA: Diagnosis not present

## 2016-11-24 DIAGNOSIS — H26493 Other secondary cataract, bilateral: Secondary | ICD-10-CM | POA: Diagnosis not present

## 2016-11-25 DIAGNOSIS — R972 Elevated prostate specific antigen [PSA]: Secondary | ICD-10-CM | POA: Diagnosis not present

## 2016-12-02 DIAGNOSIS — N401 Enlarged prostate with lower urinary tract symptoms: Secondary | ICD-10-CM | POA: Diagnosis not present

## 2016-12-02 DIAGNOSIS — R972 Elevated prostate specific antigen [PSA]: Secondary | ICD-10-CM | POA: Diagnosis not present

## 2016-12-02 DIAGNOSIS — R351 Nocturia: Secondary | ICD-10-CM | POA: Diagnosis not present

## 2016-12-12 DIAGNOSIS — G5632 Lesion of radial nerve, left upper limb: Secondary | ICD-10-CM | POA: Diagnosis not present

## 2016-12-12 DIAGNOSIS — M1812 Unilateral primary osteoarthritis of first carpometacarpal joint, left hand: Secondary | ICD-10-CM | POA: Diagnosis not present

## 2016-12-14 DIAGNOSIS — M79632 Pain in left forearm: Secondary | ICD-10-CM | POA: Diagnosis not present

## 2016-12-15 ENCOUNTER — Telehealth: Payer: Self-pay | Admitting: Family Medicine

## 2016-12-15 MED ORDER — LEVOCETIRIZINE DIHYDROCHLORIDE 5 MG PO TABS: ORAL_TABLET | ORAL | 3 refills | Status: DC

## 2016-12-15 MED ORDER — METOPROLOL SUCCINATE ER 25 MG PO TB24: ORAL_TABLET | ORAL | 3 refills | Status: DC

## 2016-12-15 NOTE — Telephone Encounter (Signed)
Medication called/sent to requested pharmacy  

## 2016-12-15 NOTE — Telephone Encounter (Signed)
Pt needs refill on metoprolol and levocetirizine sent to [facility]

## 2016-12-16 DIAGNOSIS — M79632 Pain in left forearm: Secondary | ICD-10-CM | POA: Diagnosis not present

## 2016-12-17 DIAGNOSIS — G5632 Lesion of radial nerve, left upper limb: Secondary | ICD-10-CM | POA: Diagnosis not present

## 2016-12-20 DIAGNOSIS — M79632 Pain in left forearm: Secondary | ICD-10-CM | POA: Diagnosis not present

## 2016-12-23 DIAGNOSIS — M79632 Pain in left forearm: Secondary | ICD-10-CM | POA: Diagnosis not present

## 2016-12-27 DIAGNOSIS — M79632 Pain in left forearm: Secondary | ICD-10-CM | POA: Diagnosis not present

## 2017-01-06 DIAGNOSIS — M79632 Pain in left forearm: Secondary | ICD-10-CM | POA: Diagnosis not present

## 2017-01-23 DIAGNOSIS — M1812 Unilateral primary osteoarthritis of first carpometacarpal joint, left hand: Secondary | ICD-10-CM | POA: Diagnosis not present

## 2017-01-23 DIAGNOSIS — G5632 Lesion of radial nerve, left upper limb: Secondary | ICD-10-CM | POA: Diagnosis not present

## 2017-01-23 DIAGNOSIS — G8929 Other chronic pain: Secondary | ICD-10-CM | POA: Diagnosis not present

## 2017-01-23 DIAGNOSIS — M7542 Impingement syndrome of left shoulder: Secondary | ICD-10-CM | POA: Diagnosis not present

## 2017-01-23 DIAGNOSIS — M25512 Pain in left shoulder: Secondary | ICD-10-CM | POA: Diagnosis not present

## 2017-02-15 ENCOUNTER — Ambulatory Visit (INDEPENDENT_AMBULATORY_CARE_PROVIDER_SITE_OTHER): Payer: PPO

## 2017-02-15 DIAGNOSIS — Z23 Encounter for immunization: Secondary | ICD-10-CM | POA: Diagnosis not present

## 2017-02-15 NOTE — Progress Notes (Signed)
Patient was seen in office for flu vaccine. Patient received vaccine in left deltoid. Patient tolerated well

## 2017-03-21 ENCOUNTER — Telehealth: Payer: Self-pay | Admitting: Family Medicine

## 2017-03-21 MED ORDER — RIVAROXABAN 20 MG PO TABS: ORAL_TABLET | ORAL | 3 refills | Status: DC

## 2017-03-21 NOTE — Telephone Encounter (Signed)
Medication called/sent to requested pharmacy  

## 2017-03-21 NOTE — Telephone Encounter (Signed)
Pt requesting refill on xarelto sent to [facility]. Wants 90 day supply

## 2017-03-28 ENCOUNTER — Telehealth: Payer: Self-pay | Admitting: Family Medicine

## 2017-03-28 NOTE — Telephone Encounter (Signed)
Patient is calling saying that he is in the donut hole and cannot afford his xarelto please advise  [phone]

## 2017-03-28 NOTE — Telephone Encounter (Signed)
If we have samples he can use some but otherwise, he may want to call his cardiologist for samples or we could switch from xarelto to coumadin.

## 2017-03-29 NOTE — Telephone Encounter (Signed)
Spoke to pt and he will contact his cardiologist about the Alen Blew and call me back on Monday if needing a RX for this medication. He does not want to start Coumadin.

## 2017-03-29 NOTE — Telephone Encounter (Signed)
Line busy

## 2017-04-04 DIAGNOSIS — L821 Other seborrheic keratosis: Secondary | ICD-10-CM | POA: Diagnosis not present

## 2017-04-04 DIAGNOSIS — C44319 Basal cell carcinoma of skin of other parts of face: Secondary | ICD-10-CM | POA: Diagnosis not present

## 2017-04-04 DIAGNOSIS — L570 Actinic keratosis: Secondary | ICD-10-CM | POA: Diagnosis not present

## 2017-04-04 DIAGNOSIS — D1801 Hemangioma of skin and subcutaneous tissue: Secondary | ICD-10-CM | POA: Diagnosis not present

## 2017-04-04 DIAGNOSIS — L814 Other melanin hyperpigmentation: Secondary | ICD-10-CM | POA: Diagnosis not present

## 2017-04-04 DIAGNOSIS — M7130 Other bursal cyst, unspecified site: Secondary | ICD-10-CM | POA: Diagnosis not present

## 2017-04-04 DIAGNOSIS — Z85828 Personal history of other malignant neoplasm of skin: Secondary | ICD-10-CM | POA: Diagnosis not present

## 2017-04-05 NOTE — Telephone Encounter (Signed)
Pt will need to have an rx called in for about 1.5 months in order to cover his doughnut hole. Spoke to Dr. Buelah Manis as we have pleanty of Xeralto 15mg  samples if it would be ok for him to take that for the 45 days to cover. She stated it would be fine for a short period. Pt aware and samples left up front.

## 2017-04-19 ENCOUNTER — Other Ambulatory Visit: Payer: Self-pay

## 2017-04-19 ENCOUNTER — Ambulatory Visit: Payer: PPO | Admitting: Family Medicine

## 2017-04-19 ENCOUNTER — Telehealth: Payer: Self-pay | Admitting: Family Medicine

## 2017-04-19 VITALS — BP 132/70 | HR 76 | Temp 98.1°F | Resp 18 | Wt 212.0 lb

## 2017-04-19 DIAGNOSIS — R300 Dysuria: Secondary | ICD-10-CM

## 2017-04-19 DIAGNOSIS — N410 Acute prostatitis: Secondary | ICD-10-CM | POA: Diagnosis not present

## 2017-04-19 LAB — URINALYSIS, ROUTINE W REFLEX MICROSCOPIC
BILIRUBIN URINE: NEGATIVE
GLUCOSE, UA: NEGATIVE
NITRITE: NEGATIVE
PH: 5.5 (ref 5.0–8.0)
Specific Gravity, Urine: 1.029 (ref 1.001–1.03)
Squamous Epithelial / LPF: NONE SEEN /HPF (ref <=5)

## 2017-04-19 LAB — PSA: PSA: 12.2 ng/mL — ABNORMAL HIGH (ref <=4.0)

## 2017-04-19 LAB — MICROSCOPIC MESSAGE

## 2017-04-19 MED ORDER — TAMSULOSIN HCL 0.4 MG PO CAPS: 0.4000 mg | ORAL_CAPSULE | Freq: Every day | ORAL | 3 refills | Status: DC

## 2017-04-19 MED ORDER — CIPROFLOXACIN HCL 500 MG PO TABS: 500.0000 mg | ORAL_TABLET | Freq: Two times a day (BID) | ORAL | 0 refills | Status: DC

## 2017-04-19 NOTE — Progress Notes (Signed)
Subjective:    Patient ID: Tyler Allison, male    DOB: December 17, 1940, 76 y.o.   MRN: [id number]  HPI  07/2016 Symptoms began Saturday. Symptoms consist of dysuria, frequency, urgency, and hesitancy. Patient states that it feels like there's a torch inside him when he voids.  He also reports diffuse body aches, fevers, and chills. On examination today, prostate is +3 in size and extremely tender.  At that time, my plan was: I believe the patient has acute prostatitis. Begin Cipro 500 mg by mouth twice a day for 10 days. Recheck in 10 days or sooner if worse  04/19/17 Symptoms began Sunday.  Symptoms include burning with urination.  He also reports a milky opaque foul-smelling urine.  He reports low back pain near the level of his sacrum.  He reports hesitancy and weak stream.  He denies any fever or hematuria.  Prostate exam today is significant for a swollen prostate with a tender firm right lobe but no palpable nodularity. Past Medical History:  Diagnosis Date  . Allergy   . Asthma   . Cervical spondylosis with radiculopathy    left c6 radiculopathy  . Chronic back pain   . GERD (gastroesophageal reflux disease)   . Hyperlipidemia   . Hypertension    No past surgical history on file. Current Outpatient Medications on File Prior to Visit  Medication Sig Dispense Refill  . albuterol (PROAIR HFA) 108 (90 BASE) MCG/ACT inhaler Inhale 2 puffs into the lungs every 4 (four) hours as needed. 8.5 g 3  . cholecalciferol (VITAMIN D) 1000 UNITS tablet Take 1,000 Units by mouth daily.    Marland Kitchen levocetirizine (XYZAL) 5 MG tablet TAKE 1 BY MOUTH EVERY EVENING 90 tablet 3  . metoprolol succinate (TOPROL-XL) 25 MG 24 hr tablet TAKE 1 BY MOUTH DAILY 90 tablet 3  . Multiple Vitamins-Minerals (OCUVITE PRESERVISION PO) Take by mouth.    . Omega-3 Fatty Acids (FISH OIL) 1200 MG CAPS Take 1,200-2,400 mg by mouth 2 (two) times daily. Takes 1200 mg in the morning and 2400 mg in the evening    . omeprazole  (PRILOSEC) 40 MG capsule Take 1 capsule (40 mg total) by mouth daily. 90 capsule 2  . rivaroxaban (XARELTO) 20 MG TABS tablet Take 1 tablet by mouth daily with supper 90 tablet 3  . tamsulosin (FLOMAX) 0.4 MG CAPS capsule Take 1 capsule by mouth daily.  2  . traMADol (ULTRAM) 50 MG tablet Take by mouth every 6 (six) hours as needed.     No current facility-administered medications on file prior to visit.    Allergies  Allergen Reactions  . Codeine Nausea And Vomiting   Social History   Socioeconomic History  . Marital status: Married    Spouse name: Not on file  . Number of children: Not on file  . Years of education: Not on file  . Highest education level: Not on file  Social Needs  . Financial resource strain: Not on file  . Food insecurity - worry: Not on file  . Food insecurity - inability: Not on file  . Transportation needs - medical: Not on file  . Transportation needs - non-medical: Not on file  Occupational History  . Not on file  Tobacco Use  . Smoking status: Never Smoker  . Smokeless tobacco: Never Used  Substance and Sexual Activity  . Alcohol use: No  . Drug use: No  . Sexual activity: Not on file    Comment: married, retired.  Other Topics Concern  . Not on file  Social History Narrative  . Not on file       Review of Systems  All other systems reviewed and are negative.      Objective:   Physical Exam  Cardiovascular: Normal rate, regular rhythm and normal heart sounds.  Pulmonary/Chest: Effort normal and breath sounds normal. No respiratory distress. He has no wheezes. He has no rales.  Abdominal: Soft. Bowel sounds are normal. He exhibits no distension. There is no tenderness. There is no rebound and no guarding.  Genitourinary: Prostate is enlarged and tender.  Vitals reviewed.         Assessment & Plan:  Dysuria - Plan: Urinalysis, Routine w reflex microscopic  Symptoms and exam are consistent with prostatitis.  Will check a PSA  today.  Also start the patient on Cipro 500 mg p.o. twice daily for 10 days.  Anticipate PSA will be elevated.  Will obtain baseline today and then recheck if elevated after completing antibiotics to ensure resolution giving the abnormal prostate exam to rule out underlying malignancy.

## 2017-04-19 NOTE — Telephone Encounter (Signed)
Medication called/sent to requested pharmacy  

## 2017-04-19 NOTE — Addendum Note (Signed)
Addended by: Shary Decamp B on: 04/19/2017 04:18 PM   Modules accepted: Orders

## 2017-04-19 NOTE — Telephone Encounter (Signed)
Pt needs refill on tamsulosin sent to [address]

## 2017-04-20 ENCOUNTER — Other Ambulatory Visit: Payer: Self-pay | Admitting: Family Medicine

## 2017-04-20 DIAGNOSIS — R972 Elevated prostate specific antigen [PSA]: Secondary | ICD-10-CM

## 2017-04-22 LAB — URINE CULTURE
MICRO NUMBER:: 81397602
SPECIMEN QUALITY:: ADEQUATE

## 2017-05-10 ENCOUNTER — Other Ambulatory Visit: Payer: PPO

## 2017-05-10 DIAGNOSIS — R972 Elevated prostate specific antigen [PSA]: Secondary | ICD-10-CM

## 2017-05-11 LAB — PSA: PSA: 4.8 ng/mL — AB (ref <=4.0)

## 2017-06-09 DIAGNOSIS — R972 Elevated prostate specific antigen [PSA]: Secondary | ICD-10-CM | POA: Diagnosis not present

## 2017-06-16 DIAGNOSIS — R351 Nocturia: Secondary | ICD-10-CM | POA: Diagnosis not present

## 2017-06-16 DIAGNOSIS — N401 Enlarged prostate with lower urinary tract symptoms: Secondary | ICD-10-CM | POA: Diagnosis not present

## 2017-06-16 DIAGNOSIS — R972 Elevated prostate specific antigen [PSA]: Secondary | ICD-10-CM | POA: Diagnosis not present

## 2017-07-05 ENCOUNTER — Encounter: Payer: Self-pay | Admitting: Interventional Cardiology

## 2017-07-11 NOTE — Progress Notes (Signed)
Cardiology Office Note   Date:  07/12/2017   ID:  Tyler, Allison 05/17/1940, MRN [id number]  PCP:  [name], MD    No chief complaint on file.  AFib  Wt Readings from Last 3 Encounters:  07/12/17 219 lb (99.3 kg)  04/19/17 212 lb (96.2 kg)  11/08/16 208 lb (94.3 kg)       History of Present Illness: Tyler Allison is a 77 y.o. male   Who had neck surgery on 01/27/16.  The anesthesia MD mentioned some irregular heart rhythm.  Dr. Dennard Schaumann did a Holter and this showed PAF 15 days post op. He had some bradycardia at 3AM, presumably during sleep, (2 second pause). He was started on Xarelto.  AFib rate has been as high as 142 bpm.  Even with that, he did not have sx.    He has felt well since the last visit.  Denies : Chest pain. Dizziness. Leg edema. Nitroglycerin use. Orthopnea. Palpitations. Paroxysmal nocturnal dyspnea. Shortness of breath. Syncope.   He walks in the good weather.  He can go to the mall but has not been, in the bad weather.  He has gained some weight.    No bleeding problems.    Past Medical History:  Diagnosis Date  . Allergy   . Asthma   . Cervical spondylosis with radiculopathy    left c6 radiculopathy  . Chronic back pain   . GERD (gastroesophageal reflux disease)   . Hyperlipidemia   . Hypertension     History reviewed. No pertinent surgical history.   Current Outpatient Medications  Medication Sig Dispense Refill  . albuterol (PROAIR HFA) 108 (90 BASE) MCG/ACT inhaler Inhale 2 puffs into the lungs every 4 (four) hours as needed. 8.5 g 3  . cholecalciferol (VITAMIN D) 1000 UNITS tablet Take 1,000 Units by mouth daily.    . fluticasone (FLONASE) 50 MCG/ACT nasal spray Place 2 sprays into both nostrils as needed for allergies.  4  . levocetirizine (XYZAL) 5 MG tablet TAKE 1 BY MOUTH EVERY EVENING 90 tablet 3  . metoprolol succinate (TOPROL-XL) 25 MG 24 hr tablet TAKE 1 BY MOUTH DAILY 90 tablet 3  . Multiple Vitamins-Minerals  (OCUVITE PRESERVISION PO) Take by mouth.    . Omega-3 Fatty Acids (FISH OIL) 1200 MG CAPS Take 1,200-2,400 mg by mouth 2 (two) times daily. Takes 1200 mg in the morning and 2400 mg in the evening    . omeprazole (PRILOSEC) 40 MG capsule Take 1 capsule (40 mg total) by mouth daily. 90 capsule 2  . rivaroxaban (XARELTO) 20 MG TABS tablet Take 1 tablet by mouth daily with supper 90 tablet 3  . tamsulosin (FLOMAX) 0.4 MG CAPS capsule Take 1 capsule (0.4 mg total) by mouth daily. 90 capsule 3  . traMADol (ULTRAM) 50 MG tablet Take by mouth every 6 (six) hours as needed.     No current facility-administered medications for this visit.     Allergies:   Codeine    Social History:  The patient  reports that  has never smoked. he has never used smokeless tobacco. He reports that he does not drink alcohol or use drugs.   Family History:  The patient's family history includes Heart attack in his maternal uncle; Heart failure in his mother.    ROS:  Please see the history of present illness.   Otherwise, review of systems are positive for difficulty losing weight.   All other systems are reviewed  and negative.    PHYSICAL EXAM: VS:  BP 118/70   Pulse 68   Ht 5' 10.5" (1.791 m)   Wt 219 lb (99.3 kg)   SpO2 94%   BMI 30.98 kg/m  , BMI Body mass index is 30.98 kg/m. GEN: Well nourished, well developed, in no acute distress  HEENT: normal  Neck: no JVD, carotid bruits, or masses Cardiac: RRR, occasional premature beats; no murmurs, rubs, or gallops,no edema  Respiratory:  clear to auscultation bilaterally, normal work of breathing GI: soft, nontender, nondistended, + BS MS: no deformity or atrophy  Skin: warm and dry, no rash Neuro:  Strength and sensation are intact Psych: euthymic mood, full affect   EKG:   The ekg ordered today demonstrates NSR, no ST changes   Recent Labs: 11/08/2016: ALT 13; BUN 14; Creat 0.95; Hemoglobin 14.0; Platelets 243; Potassium 4.4; Sodium 136   Lipid  Panel    Component Value Date/Time   CHOL 144 03/13/2015 0816   TRIG 153 (H) 03/13/2015 0816   HDL 49 03/13/2015 0816   CHOLHDL 2.9 03/13/2015 0816   VLDL 31 (H) 03/13/2015 0816   LDLCALC 64 03/13/2015 0816     Other studies Reviewed: Additional studies/ records that were reviewed today with results demonstrating: 2017 echo with normal valvular function and normal LV function .   ASSESSMENT AND PLAN:  1. AFib: He is due for CBC and BMet for Xarelto.  Would request PMD to check these at upcoming physical. In NSR. Continue metoprolol.  2. Hyperlipidemia: he was on statin in the past, but it was stopped due to the statin.  He is controlling his cholesterol with diet.  He needs a recheck.  He will go back to his PMD for this recheck.  This patients CHA2DS2-VASc Score and unadjusted Ischemic Stroke Rate (% per year) is equal to 2.2 % stroke rate/year from a score of 2  Above score calculated as 1 point each if present [CHF, HTN, DM, Vascular=MI/PAD/Aortic Plaque, Age if 65-74, or Male] Above score calculated as 2 points each if present [Age > 75, or Stroke/TIA/TE]  3. Anticoagulated: Continue Xarelto since benefits outweigh risks at this time.    Current medicines are reviewed at length with the patient today.  The patient concerns regarding his medicines were addressed.  The following changes have been made:  No change  Labs/ tests ordered today include: to be checked with PMD No orders of the defined types were placed in this encounter.   Recommend 150 minutes/week of aerobic exercise Low fat, low carb, high fiber diet recommended  Disposition:   FU in 1 year   Signed, Larae Grooms, MD  07/12/2017 9:28 AM    [facility] [address], [place], [place]  [address] Phone: [phone]; Fax: [phone]

## 2017-07-12 ENCOUNTER — Encounter: Payer: Self-pay | Admitting: Interventional Cardiology

## 2017-07-12 ENCOUNTER — Ambulatory Visit: Payer: PPO | Admitting: Interventional Cardiology

## 2017-07-12 VITALS — BP 118/70 | HR 68 | Ht 70.5 in | Wt 219.0 lb

## 2017-07-12 DIAGNOSIS — E782 Mixed hyperlipidemia: Secondary | ICD-10-CM

## 2017-07-12 DIAGNOSIS — I480 Paroxysmal atrial fibrillation: Secondary | ICD-10-CM | POA: Diagnosis not present

## 2017-07-12 DIAGNOSIS — Z7901 Long term (current) use of anticoagulants: Secondary | ICD-10-CM

## 2017-07-12 NOTE — Patient Instructions (Signed)
Medication Instructions:  Your physician recommends that you continue on your current medications as directed. Please refer to the Current Medication list given to you today.   Labwork: Please have your Primary Care doctor check your labs: CBC, BMET  Testing/Procedures: None ordered  Follow-Up: Your physician wants you to follow-up in: 1 year with Dr. Irish Lack. You will receive a reminder letter in the mail two months in advance. If you don't receive a letter, please call our office to schedule the follow-up appointment.   Any Other Special Instructions Will Be Listed Below (If Applicable).     If you need a refill on your cardiac medications before your next appointment, please call your pharmacy.

## 2017-07-27 DIAGNOSIS — H26493 Other secondary cataract, bilateral: Secondary | ICD-10-CM | POA: Diagnosis not present

## 2017-07-27 DIAGNOSIS — H43813 Vitreous degeneration, bilateral: Secondary | ICD-10-CM | POA: Diagnosis not present

## 2017-07-27 DIAGNOSIS — H353132 Nonexudative age-related macular degeneration, bilateral, intermediate dry stage: Secondary | ICD-10-CM | POA: Diagnosis not present

## 2017-07-27 DIAGNOSIS — H53002 Unspecified amblyopia, left eye: Secondary | ICD-10-CM | POA: Diagnosis not present

## 2017-08-03 ENCOUNTER — Other Ambulatory Visit: Payer: Self-pay | Admitting: Family Medicine

## 2017-08-03 DIAGNOSIS — K219 Gastro-esophageal reflux disease without esophagitis: Secondary | ICD-10-CM

## 2017-08-31 DIAGNOSIS — M170 Bilateral primary osteoarthritis of knee: Secondary | ICD-10-CM | POA: Diagnosis not present

## 2017-08-31 DIAGNOSIS — M25562 Pain in left knee: Secondary | ICD-10-CM | POA: Diagnosis not present

## 2017-08-31 DIAGNOSIS — M25561 Pain in right knee: Secondary | ICD-10-CM | POA: Diagnosis not present

## 2017-11-23 ENCOUNTER — Other Ambulatory Visit: Payer: PPO

## 2017-11-23 DIAGNOSIS — E782 Mixed hyperlipidemia: Secondary | ICD-10-CM | POA: Diagnosis not present

## 2017-11-23 DIAGNOSIS — K219 Gastro-esophageal reflux disease without esophagitis: Secondary | ICD-10-CM | POA: Diagnosis not present

## 2017-11-23 DIAGNOSIS — I480 Paroxysmal atrial fibrillation: Secondary | ICD-10-CM

## 2017-11-23 LAB — COMPREHENSIVE METABOLIC PANEL
AG Ratio: 1.6 (calc) (ref 1.0–2.5)
ALT: 14 U/L (ref 9–46)
AST: 26 U/L (ref 10–35)
Albumin: 4.1 g/dL (ref 3.6–5.1)
Alkaline phosphatase (APISO): 72 U/L (ref 40–115)
BILIRUBIN TOTAL: 0.6 mg/dL (ref 0.2–1.2)
BUN: 13 mg/dL (ref 7–25)
CALCIUM: 9.2 mg/dL (ref 8.6–10.3)
CO2: 28 mmol/L (ref 20–32)
Chloride: 101 mmol/L (ref 98–110)
Creat: 1.06 mg/dL (ref 0.70–1.18)
Globulin: 2.6 g/dL (calc) (ref 1.9–3.7)
Glucose, Bld: 104 mg/dL — ABNORMAL HIGH (ref 65–99)
Potassium: 4.6 mmol/L (ref 3.5–5.3)
SODIUM: 137 mmol/L (ref 135–146)
Total Protein: 6.7 g/dL (ref 6.1–8.1)

## 2017-11-23 LAB — LIPID PANEL
CHOLESTEROL: 251 mg/dL — AB (ref <200)
HDL: 42 mg/dL (ref >40)
LDL Cholesterol (Calc): 177 mg/dL (calc) — ABNORMAL HIGH
NON-HDL CHOLESTEROL (CALC): 209 mg/dL — AB (ref <130)
Total CHOL/HDL Ratio: 6 (calc) — ABNORMAL HIGH (ref <5.0)
Triglycerides: 171 mg/dL — ABNORMAL HIGH (ref <150)

## 2017-11-23 LAB — CBC WITH DIFFERENTIAL/PLATELET
BASOS ABS: 78 {cells}/uL (ref 0–200)
Basophils Relative: 1.2 %
EOS ABS: 501 {cells}/uL — AB (ref 15–500)
Eosinophils Relative: 7.7 %
HEMATOCRIT: 41.4 % (ref 38.5–50.0)
Hemoglobin: 14.1 g/dL (ref 13.2–17.1)
LYMPHS ABS: 2093 {cells}/uL (ref 850–3900)
MCH: 31.1 pg (ref 27.0–33.0)
MCHC: 34.1 g/dL (ref 32.0–36.0)
MCV: 91.2 fL (ref 80.0–100.0)
MPV: 10.4 fL (ref 7.5–12.5)
Monocytes Relative: 14.1 %
NEUTROS ABS: 2912 {cells}/uL (ref 1500–7800)
Neutrophils Relative %: 44.8 %
Platelets: 235 10*3/uL (ref 140–400)
RBC: 4.54 10*6/uL (ref 4.20–5.80)
RDW: 12.4 % (ref 11.0–15.0)
Total Lymphocyte: 32.2 %
WBC mixed population: 917 cells/uL (ref 200–950)
WBC: 6.5 10*3/uL (ref 3.8–10.8)

## 2017-11-24 ENCOUNTER — Other Ambulatory Visit: Payer: Self-pay

## 2017-11-24 DIAGNOSIS — E7800 Pure hypercholesterolemia, unspecified: Secondary | ICD-10-CM

## 2017-11-24 MED ORDER — ATORVASTATIN CALCIUM 40 MG PO TABS: 40.0000 mg | ORAL_TABLET | Freq: Every day | ORAL | 0 refills | Status: DC

## 2017-11-30 ENCOUNTER — Ambulatory Visit (INDEPENDENT_AMBULATORY_CARE_PROVIDER_SITE_OTHER): Payer: PPO | Admitting: Family Medicine

## 2017-11-30 ENCOUNTER — Encounter: Payer: Self-pay | Admitting: Family Medicine

## 2017-11-30 VITALS — BP 122/70 | HR 68 | Temp 98.1°F | Resp 14 | Ht 70.5 in | Wt 208.0 lb

## 2017-11-30 DIAGNOSIS — E782 Mixed hyperlipidemia: Secondary | ICD-10-CM | POA: Diagnosis not present

## 2017-11-30 DIAGNOSIS — J4520 Mild intermittent asthma, uncomplicated: Secondary | ICD-10-CM | POA: Diagnosis not present

## 2017-11-30 DIAGNOSIS — I480 Paroxysmal atrial fibrillation: Secondary | ICD-10-CM | POA: Diagnosis not present

## 2017-11-30 DIAGNOSIS — Z0000 Encounter for general adult medical examination without abnormal findings: Secondary | ICD-10-CM | POA: Diagnosis not present

## 2017-11-30 DIAGNOSIS — R972 Elevated prostate specific antigen [PSA]: Secondary | ICD-10-CM | POA: Diagnosis not present

## 2017-11-30 NOTE — Progress Notes (Signed)
Subjective:    Patient ID: Tyler Allison, male    DOB: [birth year], 77 y.o.   MRN: [id number]  HPI Subjective:   Patient presents for Medicare Annual/Subsequent preventive examination.  He is currently seeing urology to manage his PSA.  His PSA has been relatively stable in the 5 range.  He has been monitored every 6 months but tentatively the plan is to space to annually if his PSA remains stable.  He has been found to have paroxysmal atrial fibrillation and is currently on metoprolol for rate control as well as Xarelto.  Today on his exam he is in normal sinus rhythm.  However he is going to be unable to afford Xarelto long-term.  He is here today to discuss treatment options other than Xarelto. Immunization History  Administered Date(s) Administered  . Influenza Whole 02/09/2010  . Influenza, High Dose Seasonal PF 02/15/2017  . Influenza,inj,Quad PF,6+ Mos 02/13/2013, 02/25/2014, 03/13/2015, 02/11/2016  . Pneumococcal Conjugate-13 03/14/2014  . Pneumococcal Polysaccharide-23 05/09/2004  . Td 01/07/2005  . Zoster 08/08/2006   He is due today for Pneumovax 23.  Last colonoscopy was 2016 and is up-to-date.  The remainder of his vaccinations are up-to-date.  He denies any issues with falls or depression. Review Past Medical/Family/Social: Past Medical History:  Diagnosis Date  . Allergy   . Asthma   . Cervical spondylosis with radiculopathy    left c6 radiculopathy  . Chronic back pain   . GERD (gastroesophageal reflux disease)   . Hyperlipidemia   . Hypertension    No past surgical history on file. Current Outpatient Medications on File Prior to Visit  Medication Sig Dispense Refill  . albuterol (PROAIR HFA) 108 (90 BASE) MCG/ACT inhaler Inhale 2 puffs into the lungs every 4 (four) hours as needed. 8.5 g 3  . atorvastatin (LIPITOR) 40 MG tablet Take 1 tablet (40 mg total) by mouth daily. 90 tablet 0  . fluticasone (FLONASE) 50 MCG/ACT nasal spray Place 2 sprays into both nostrils  as needed for allergies.  4  . levocetirizine (XYZAL) 5 MG tablet TAKE 1 BY MOUTH EVERY EVENING 90 tablet 3  . metoprolol succinate (TOPROL-XL) 25 MG 24 hr tablet TAKE 1 BY MOUTH DAILY 90 tablet 3  . Multiple Vitamins-Minerals (ICAPS AREDS 2 PO) Take by mouth.    . Omega-3 Fatty Acids (FISH OIL) 1200 MG CAPS Take 1,200-2,400 mg by mouth 2 (two) times daily. Takes 1200 mg in the morning and 2400 mg in the evening    . omeprazole (PRILOSEC) 40 MG capsule Take 1 capsule by mouth once daily 90 capsule 1  . rivaroxaban (XARELTO) 20 MG TABS tablet Take 1 tablet by mouth daily with supper 90 tablet 3  . tamsulosin (FLOMAX) 0.4 MG CAPS capsule Take 1 capsule (0.4 mg total) by mouth daily. 90 capsule 3  . traMADol (ULTRAM) 50 MG tablet Take by mouth every 6 (six) hours as needed.     No current facility-administered medications on file prior to visit.    Allergies  Allergen Reactions  . Codeine Nausea And Vomiting   Social History   Socioeconomic History  . Marital status: Married    Spouse name: Not on file  . Number of children: Not on file  . Years of education: Not on file  . Highest education level: Not on file  Occupational History  . Not on file  Social Needs  . Financial resource strain: Not on file  . Food insecurity:    Worry:  Not on file    Inability: Not on file  . Transportation needs:    Medical: Not on file    Non-medical: Not on file  Tobacco Use  . Smoking status: Never Smoker  . Smokeless tobacco: Never Used  Substance and Sexual Activity  . Alcohol use: No  . Drug use: No  . Sexual activity: Not on file    Comment: married, retired.  Lifestyle  . Physical activity:    Days per week: Not on file    Minutes per session: Not on file  . Stress: Not on file  Relationships  . Social connections:    Talks on phone: Not on file    Gets together: Not on file    Attends religious service: Not on file    Active member of club or organization: Not on file    Attends  meetings of clubs or organizations: Not on file    Relationship status: Not on file  . Intimate partner violence:    Fear of current or ex partner: Not on file    Emotionally abused: Not on file    Physically abused: Not on file    Forced sexual activity: Not on file  Other Topics Concern  . Not on file  Social History Narrative  . Not on file   Family History  Problem Relation Age of Onset  . Heart failure Mother   . Heart attack Maternal Uncle     Depression Screen  (Note: if answer to either of the following is "Yes", a more complete depression screening is indicated)  Over the past two weeks, have you felt down, depressed or hopeless? No Over the past two weeks, have you felt little interest or pleasure in doing things? No Have you lost interest or pleasure in daily life? No Do you often feel hopeless? No Do you cry easily over simple problems? No   Activities of Daily Living  In your present state of health, do you have any difficulty performing the following activities?:  Driving? No  Managing money? No  Feeding yourself? No  Getting from bed to chair? No  Climbing a flight of stairs? No  Preparing food and eating?: No  Bathing or showering? No  Getting dressed: No  Getting to the toilet? No  Using the toilet:No  Moving around from place to place: No  In the past year have you fallen or had a near fall?:No  Are you sexually active? No  Do you have more than one partner? No   Hearing Difficulties: No  Do you often ask people to speak up or repeat themselves? No  Do you experience ringing or noises in your ears? No Do you have difficulty understanding soft or whispered voices? No  Do you feel that you have a problem with memory? No Do you often misplace items? No  Do you feel safe at home? Yes  Cognitive Testing  Alert? Yes Normal Appearance?Yes  Oriented to person? Yes Place? Yes  Time? Yes  Recall of three objects? Yes  Can perform simple calculations?  Yes  Displays appropriate judgment?Yes  Can read the correct time from a watch face?Yes  Review of Systems  All other systems reviewed and are negative.      Objective:   Physical Exam  Constitutional: He is oriented to person, place, and time. He appears well-developed and well-nourished. No distress.  HENT:  Head: Normocephalic and atraumatic.  Right Ear: External ear normal.  Left Ear: External  ear normal.  Nose: Nose normal.  Mouth/Throat: Oropharynx is clear and moist. No oropharyngeal exudate.  Eyes: Pupils are equal, round, and reactive to light. Conjunctivae and EOM are normal. Right eye exhibits no discharge. Left eye exhibits no discharge. No scleral icterus.  Neck: Normal range of motion. Neck supple. No JVD present. No tracheal deviation present. No thyromegaly present.  Cardiovascular: Normal rate, regular rhythm, normal heart sounds and intact distal pulses. Exam reveals no gallop and no friction rub.  No murmur heard. Pulmonary/Chest: Effort normal and breath sounds normal. No stridor. No respiratory distress. He has no wheezes. He has no rales. He exhibits no tenderness.  Abdominal: Soft. Bowel sounds are normal. He exhibits no distension and no mass. There is no tenderness. There is no rebound and no guarding.  Musculoskeletal: Normal range of motion. He exhibits no edema or tenderness.  Lymphadenopathy:    He has no cervical adenopathy.  Neurological: He is alert and oriented to person, place, and time. He has normal reflexes. No cranial nerve deficit. He exhibits normal muscle tone. Coordination normal.  Skin: Skin is warm. No rash noted. He is not diaphoretic. No erythema. No pallor.  Psychiatric: He has a normal mood and affect. His behavior is normal. Judgment and thought content normal.  Vitals reviewed.  Lab on 11/23/2017  Component Date Value Ref Range Status  . Cholesterol 11/23/2017 251* <200 mg/dL Final  . HDL 11/23/2017 42  >40 mg/dL Final  .  Triglycerides 11/23/2017 171* <150 mg/dL Final  . LDL Cholesterol (Calc) 11/23/2017 177* mg/dL (calc) Final   Comment: Reference range: <100 . Desirable range <100 mg/dL for primary prevention;   <70 mg/dL for patients with CHD or diabetic patients  with > or = 2 CHD risk factors. Marland Kitchen LDL-C is now calculated using the Martin-Hopkins  calculation, which is a validated novel method providing  better accuracy than the Friedewald equation in the  estimation of LDL-C.  Cresenciano Genre et al. Annamaria Helling. 5427;062(37): 2061-2068  (http://education.[facility].com/faq/FAQ164)   . Total CHOL/HDL Ratio 11/23/2017 6.0* <5.0 (calc) Final  . Non-HDL Cholesterol (Calc) 11/23/2017 209* <130 mg/dL (calc) Final   Comment: For patients with diabetes plus 1 major ASCVD risk  factor, treating to a non-HDL-C goal of <100 mg/dL  (LDL-C of <70 mg/dL) is considered a therapeutic  option.   . WBC 11/23/2017 6.5  3.8 - 10.8 Thousand/uL Final  . RBC 11/23/2017 4.54  4.20 - 5.80 Million/uL Final  . Hemoglobin 11/23/2017 14.1  13.2 - 17.1 g/dL Final  . HCT 11/23/2017 41.4  38.5 - 50.0 % Final  . MCV 11/23/2017 91.2  80.0 - 100.0 fL Final  . MCH 11/23/2017 31.1  27.0 - 33.0 pg Final  . MCHC 11/23/2017 34.1  32.0 - 36.0 g/dL Final  . RDW 11/23/2017 12.4  11.0 - 15.0 % Final  . Platelets 11/23/2017 235  140 - 400 Thousand/uL Final  . MPV 11/23/2017 10.4  7.5 - 12.5 fL Final  . Neutro Abs 11/23/2017 2,912  1,500 - 7,800 cells/uL Final  . Lymphs Abs 11/23/2017 2,093  850 - 3,900 cells/uL Final  . WBC mixed population 11/23/2017 917  200 - 950 cells/uL Final  . Eosinophils Absolute 11/23/2017 501* 15 - 500 cells/uL Final  . Basophils Absolute 11/23/2017 78  0 - 200 cells/uL Final  . Neutrophils Relative % 11/23/2017 44.8  % Final  . Total Lymphocyte 11/23/2017 32.2  % Final  . Monocytes Relative 11/23/2017 14.1  % Final  . Eosinophils  Relative 11/23/2017 7.7  % Final  . Basophils Relative 11/23/2017 1.2  % Final  .  Glucose, Bld 11/23/2017 104* 65 - 99 mg/dL Final   Comment: .            Fasting reference interval . For someone without known diabetes, a glucose value between 100 and 125 mg/dL is consistent with prediabetes and should be confirmed with a follow-up test. .   . BUN 11/23/2017 13  7 - 25 mg/dL Final  . Creat 11/23/2017 1.06  0.70 - 1.18 mg/dL Final   Comment: For patients >72 years of age, the reference limit for Creatinine is approximately 13% higher for people identified as African-American. .   Havery Moros Ratio 40/81/4481 NOT APPLICABLE  6 - 22 (calc) Final  . Sodium 11/23/2017 137  135 - 146 mmol/L Final  . Potassium 11/23/2017 4.6  3.5 - 5.3 mmol/L Final  . Chloride 11/23/2017 101  98 - 110 mmol/L Final  . CO2 11/23/2017 28  20 - 32 mmol/L Final  . Calcium 11/23/2017 9.2  8.6 - 10.3 mg/dL Final  . Total Protein 11/23/2017 6.7  6.1 - 8.1 g/dL Final  . Albumin 11/23/2017 4.1  3.6 - 5.1 g/dL Final  . Globulin 11/23/2017 2.6  1.9 - 3.7 g/dL (calc) Final  . AG Ratio 11/23/2017 1.6  1.0 - 2.5 (calc) Final  . Total Bilirubin 11/23/2017 0.6  0.2 - 1.2 mg/dL Final  . Alkaline phosphatase (APISO) 11/23/2017 72  40 - 115 U/L Final  . AST 11/23/2017 26  10 - 35 U/L Final  . ALT 11/23/2017 14  9 - 46 U/L Final          Assessment & Plan:  General medical exam  Paroxysmal atrial fibrillation (HCC)  Elevated PSA  Mixed hyperlipidemia  Mild intermittent asthma without complication Patient is being monitored for prostate cancer by his urologist.  Colonoscopy is up-to-date.  Immunizations are up-to-date.  Patient did receive Pneumovax 23 today in clinic to complete recommendations to have one additional pneumonia vaccine after age 47.  We did discuss the new shingles vaccine, Shingrix.  He will check on the price of this first.  He can also return at his convenience for Tdap.  I reviewed his lab work.  His ten-year risk for cardiovascular disease is 29% based on his  cholesterol.  I recommended trying Lipitor which is already been prescribed and rechecking cholesterol in 3 months.  We also discussed switching from Xarelto to warfarin.  We discussed the screening implications of this.  Patient will maintain on Xarelto at the present time but we can switch to Coumadin in the future if Xarelto remains cost prohibitive.  At the present time he is able to afford it.

## 2017-12-05 ENCOUNTER — Ambulatory Visit (INDEPENDENT_AMBULATORY_CARE_PROVIDER_SITE_OTHER): Payer: PPO | Admitting: Family Medicine

## 2017-12-05 DIAGNOSIS — Z23 Encounter for immunization: Secondary | ICD-10-CM | POA: Diagnosis not present

## 2017-12-15 DIAGNOSIS — R972 Elevated prostate specific antigen [PSA]: Secondary | ICD-10-CM | POA: Diagnosis not present

## 2017-12-22 DIAGNOSIS — R972 Elevated prostate specific antigen [PSA]: Secondary | ICD-10-CM | POA: Diagnosis not present

## 2017-12-22 DIAGNOSIS — R351 Nocturia: Secondary | ICD-10-CM | POA: Diagnosis not present

## 2017-12-22 DIAGNOSIS — N401 Enlarged prostate with lower urinary tract symptoms: Secondary | ICD-10-CM | POA: Diagnosis not present

## 2017-12-28 ENCOUNTER — Other Ambulatory Visit: Payer: Self-pay | Admitting: Family Medicine

## 2018-01-10 DIAGNOSIS — H353122 Nonexudative age-related macular degeneration, left eye, intermediate dry stage: Secondary | ICD-10-CM | POA: Diagnosis not present

## 2018-01-10 DIAGNOSIS — H43812 Vitreous degeneration, left eye: Secondary | ICD-10-CM | POA: Diagnosis not present

## 2018-01-10 DIAGNOSIS — H353210 Exudative age-related macular degeneration, right eye, stage unspecified: Secondary | ICD-10-CM | POA: Diagnosis not present

## 2018-01-10 DIAGNOSIS — H353132 Nonexudative age-related macular degeneration, bilateral, intermediate dry stage: Secondary | ICD-10-CM | POA: Diagnosis not present

## 2018-01-30 ENCOUNTER — Telehealth: Payer: Self-pay | Admitting: Family Medicine

## 2018-01-30 NOTE — Telephone Encounter (Signed)
Pt called and states that he is having to use his inhaler a lot more now for his asthma with this allergy season and would like to know if we could order him a nebulizer so he would not have to use the inhaler so much???  *Needs samples of Xarelto also*

## 2018-01-30 NOTE — Telephone Encounter (Signed)
NTBS if worsening, nebulizer and inhaler are the same medicine.  If needing either too much it is poorly controlled and may need preventative.

## 2018-01-30 NOTE — Telephone Encounter (Signed)
Pt aware and apt made 

## 2018-02-01 ENCOUNTER — Encounter: Payer: Self-pay | Admitting: Family Medicine

## 2018-02-01 ENCOUNTER — Ambulatory Visit (INDEPENDENT_AMBULATORY_CARE_PROVIDER_SITE_OTHER): Payer: PPO | Admitting: Family Medicine

## 2018-02-01 VITALS — BP 110/68 | HR 58 | Temp 97.9°F | Resp 16 | Ht 70.5 in | Wt 203.0 lb

## 2018-02-01 DIAGNOSIS — Z23 Encounter for immunization: Secondary | ICD-10-CM

## 2018-02-01 DIAGNOSIS — J4520 Mild intermittent asthma, uncomplicated: Secondary | ICD-10-CM

## 2018-02-01 NOTE — Progress Notes (Signed)
Subjective:    Patient ID: Tyler Allison, male    DOB: [birth year], 77 y.o.   MRN: [id number]  HPI  Patient called earlier this week questing a nebulizer so that he would not have to use his rescue inhaler as much?.  Apparently he is using his rescue inhaler 3-4 times a week.  Therefore asked the patient come in today to see if he is in an asthma exacerbation and to determine the best way to manage his asthma focusing on prevention rather than rescue inhalers or rescue nebulizer treatments.  Patient states that over the last 6 to 8 weeks, he is having to use his albuterol almost on a daily basis.  Particular if he goes outside or does any work outdoors, he will start to wheeze and experience congestion in his airways.  If he comes in and takes 2 puffs of albuterol, the symptoms will go away.  He discontinued his allergy medication and he wonders if fall allergies could be triggering his bronchospasms.  He denies any coughs or chills or fevers or hemoptysis.  He denies any chest pain   Past Medical History:  Diagnosis Date  . Allergy   . Asthma   . Cervical spondylosis with radiculopathy    left c6 radiculopathy  . Chronic back pain   . GERD (gastroesophageal reflux disease)   . Hyperlipidemia   . Hypertension    No past surgical history on file. Current Outpatient Medications on File Prior to Visit  Medication Sig Dispense Refill  . albuterol (PROAIR HFA) 108 (90 BASE) MCG/ACT inhaler Inhale 2 puffs into the lungs every 4 (four) hours as needed. 8.5 g 3  . atorvastatin (LIPITOR) 40 MG tablet Take 1 tablet (40 mg total) by mouth daily. 90 tablet 0  . fluticasone (FLONASE) 50 MCG/ACT nasal spray Place 2 sprays into both nostrils as needed for allergies.  4  . levocetirizine (XYZAL) 5 MG tablet TAKE 1 BY MOUTH EVERY EVENING 90 tablet 3  . metoprolol succinate (TOPROL-XL) 25 MG 24 hr tablet Take 1 tablet by mouth daily 90 tablet 2  . Multiple Vitamins-Minerals (ICAPS AREDS 2 PO) Take by  mouth.    . Omega-3 Fatty Acids (FISH OIL) 1200 MG CAPS Take 1,200-2,400 mg by mouth 2 (two) times daily. Takes 1200 mg in the morning and 2400 mg in the evening    . omeprazole (PRILOSEC) 40 MG capsule Take 1 capsule by mouth once daily 90 capsule 1  . rivaroxaban (XARELTO) 20 MG TABS tablet Take 1 tablet by mouth daily with supper 90 tablet 3  . tamsulosin (FLOMAX) 0.4 MG CAPS capsule Take 1 capsule (0.4 mg total) by mouth daily. 90 capsule 3  . traMADol (ULTRAM) 50 MG tablet Take by mouth every 6 (six) hours as needed.     No current facility-administered medications on file prior to visit.    Allergies  Allergen Reactions  . Codeine Nausea And Vomiting   Social History   Socioeconomic History  . Marital status: Married    Spouse name: Not on file  . Number of children: Not on file  . Years of education: Not on file  . Highest education level: Not on file  Occupational History  . Not on file  Social Needs  . Financial resource strain: Not on file  . Food insecurity:    Worry: Not on file    Inability: Not on file  . Transportation needs:    Medical: Not on file  Non-medical: Not on file  Tobacco Use  . Smoking status: Never Smoker  . Smokeless tobacco: Never Used  Substance and Sexual Activity  . Alcohol use: No  . Drug use: No  . Sexual activity: Not on file    Comment: married, retired.  Lifestyle  . Physical activity:    Days per week: Not on file    Minutes per session: Not on file  . Stress: Not on file  Relationships  . Social connections:    Talks on phone: Not on file    Gets together: Not on file    Attends religious service: Not on file    Active member of club or organization: Not on file    Attends meetings of clubs or organizations: Not on file    Relationship status: Not on file  . Intimate partner violence:    Fear of current or ex partner: Not on file    Emotionally abused: Not on file    Physically abused: Not on file    Forced sexual  activity: Not on file  Other Topics Concern  . Not on file  Social History Narrative  . Not on file       Review of Systems  All other systems reviewed and are negative.      Objective:   Physical Exam  HENT:  Nose: No mucosal edema or rhinorrhea.  Mouth/Throat: No oropharyngeal exudate, posterior oropharyngeal edema or posterior oropharyngeal erythema.  Cardiovascular: Normal rate, regular rhythm and normal heart sounds.  Pulmonary/Chest: Effort normal and breath sounds normal. No respiratory distress. He has no wheezes. He has no rales.  Abdominal: Soft. Bowel sounds are normal. He exhibits no distension. There is no tenderness. There is no rebound and no guarding.  Vitals reviewed.         Assessment & Plan:  Mild intermittent asthma without complication  I believe the patient's allergies are likely exacerbating his asthma.  I have recommended that the patient use Zyrtec or Xyzal on a daily basis to control allergies.  He could also start Symbicort 160/4.5, 2 puffs inhaled twice daily as a means to help better manage his asthma so that he is not requiring his rescue inhaler quite so often.  Recheck in 2 or better or sooner if worse

## 2018-02-26 ENCOUNTER — Other Ambulatory Visit: Payer: PPO

## 2018-02-26 DIAGNOSIS — E7800 Pure hypercholesterolemia, unspecified: Secondary | ICD-10-CM | POA: Diagnosis not present

## 2018-02-26 LAB — LIPID PANEL
CHOL/HDL RATIO: 3.4 (calc) (ref <5.0)
CHOLESTEROL: 138 mg/dL (ref <200)
HDL: 41 mg/dL (ref >40)
LDL Cholesterol (Calc): 78 mg/dL (calc)
Non-HDL Cholesterol (Calc): 97 mg/dL (calc) (ref <130)
TRIGLYCERIDES: 104 mg/dL (ref <150)

## 2018-02-27 ENCOUNTER — Encounter: Payer: Self-pay | Admitting: *Deleted

## 2018-03-20 DIAGNOSIS — H7101 Cholesteatoma of attic, right ear: Secondary | ICD-10-CM | POA: Diagnosis not present

## 2018-03-20 DIAGNOSIS — H6123 Impacted cerumen, bilateral: Secondary | ICD-10-CM | POA: Diagnosis not present

## 2018-04-02 ENCOUNTER — Other Ambulatory Visit: Payer: Self-pay | Admitting: Family Medicine

## 2018-04-03 ENCOUNTER — Other Ambulatory Visit: Payer: Self-pay | Admitting: Family Medicine

## 2018-04-03 MED ORDER — RIVAROXABAN 20 MG PO TABS: ORAL_TABLET | ORAL | 3 refills | Status: DC

## 2018-04-04 ENCOUNTER — Encounter: Payer: Self-pay | Admitting: Family Medicine

## 2018-04-04 DIAGNOSIS — L570 Actinic keratosis: Secondary | ICD-10-CM | POA: Diagnosis not present

## 2018-04-04 DIAGNOSIS — D044 Carcinoma in situ of skin of scalp and neck: Secondary | ICD-10-CM | POA: Diagnosis not present

## 2018-04-04 DIAGNOSIS — D0439 Carcinoma in situ of skin of other parts of face: Secondary | ICD-10-CM | POA: Diagnosis not present

## 2018-04-04 DIAGNOSIS — L814 Other melanin hyperpigmentation: Secondary | ICD-10-CM | POA: Diagnosis not present

## 2018-04-04 DIAGNOSIS — Z85828 Personal history of other malignant neoplasm of skin: Secondary | ICD-10-CM | POA: Diagnosis not present

## 2018-04-04 DIAGNOSIS — L821 Other seborrheic keratosis: Secondary | ICD-10-CM | POA: Diagnosis not present

## 2018-04-04 DIAGNOSIS — D1801 Hemangioma of skin and subcutaneous tissue: Secondary | ICD-10-CM | POA: Diagnosis not present

## 2018-04-04 DIAGNOSIS — D225 Melanocytic nevi of trunk: Secondary | ICD-10-CM | POA: Diagnosis not present

## 2018-04-11 DIAGNOSIS — H43812 Vitreous degeneration, left eye: Secondary | ICD-10-CM | POA: Diagnosis not present

## 2018-04-11 DIAGNOSIS — H353132 Nonexudative age-related macular degeneration, bilateral, intermediate dry stage: Secondary | ICD-10-CM | POA: Diagnosis not present

## 2018-05-03 ENCOUNTER — Ambulatory Visit (INDEPENDENT_AMBULATORY_CARE_PROVIDER_SITE_OTHER): Payer: PPO | Admitting: Family Medicine

## 2018-05-03 ENCOUNTER — Encounter: Payer: Self-pay | Admitting: Family Medicine

## 2018-05-03 VITALS — BP 130/90 | HR 74 | Temp 97.8°F | Resp 15 | Ht 70.5 in | Wt 212.2 lb

## 2018-05-03 DIAGNOSIS — J019 Acute sinusitis, unspecified: Secondary | ICD-10-CM | POA: Diagnosis not present

## 2018-05-03 DIAGNOSIS — J4521 Mild intermittent asthma with (acute) exacerbation: Secondary | ICD-10-CM | POA: Diagnosis not present

## 2018-05-03 DIAGNOSIS — B9689 Other specified bacterial agents as the cause of diseases classified elsewhere: Secondary | ICD-10-CM | POA: Diagnosis not present

## 2018-05-03 MED ORDER — GUAIFENESIN ER 600 MG PO TB12: 600.0000 mg | ORAL_TABLET | Freq: Two times a day (BID) | ORAL | 0 refills | Status: AC

## 2018-05-03 MED ORDER — PREDNISONE 20 MG PO TABS: 40.0000 mg | ORAL_TABLET | Freq: Every day | ORAL | 0 refills | Status: AC

## 2018-05-03 MED ORDER — ALBUTEROL SULFATE 108 (90 BASE) MCG/ACT IN AEPB: 2.0000 | INHALATION_SPRAY | RESPIRATORY_TRACT | 1 refills | Status: DC | PRN

## 2018-05-03 MED ORDER — BENZONATATE 100 MG PO CAPS: 100.0000 mg | ORAL_CAPSULE | Freq: Three times a day (TID) | ORAL | 0 refills | Status: DC | PRN

## 2018-05-03 MED ORDER — DOXYCYCLINE HYCLATE 100 MG PO TABS: 100.0000 mg | ORAL_TABLET | Freq: Two times a day (BID) | ORAL | 0 refills | Status: AC

## 2018-05-03 NOTE — Progress Notes (Signed)
Patient ID: Tyler Allison, male    DOB: May 18, 1940, 77 y.o.   MRN: [id number]  PCP: [name], MD  Chief Complaint  Patient presents with  . Sinusitis    Has c/o headache, teeth pain, chest congestion, cough    Subjective:   Tyler Allison is a 77 y.o. male, presents to clinic with CC of sinus pain and pressure, productive cough.  Started 5 to 6 days ago and has gradually worsened.  He has a lot of severe head and sinus pain located in his temples his cheeks and has started to go into his teeth.  Sinus pain also is associated with burning and profuse discharge out of his nose and postnasal drip.  He is also has cough with productive sputum that started out white to gray and has become more brown he associates that with burning and raw feeling in his central chest up to his throat.  He describes coughing fits which keep him up all night.  He has a history of asthma which is mild and intermittent and is currently treated by his rescue inhaler he states that he has not been using it much and does not feel wheezy or short of breath.  He took Sudafed and the pain in his teeth improved a little bit, he has been taking C: Mucinex but coughing persists.    No fever, body aches sweats chills, but does "feel lousy"  He does have a history of proximal A. fib and is on Xarelto, he has not had any lower extremity edema, orthopnea, PND, and lightheadedness/syncope, chest pain.    Also has a history of acid reflux which has improved any currently takes Prilosec intermittently.   Patient Active Problem List   Diagnosis Date Noted  . Paroxysmal atrial fibrillation ([place]) 07/01/2016  . Hyperlipidemia 07/01/2016  . Cervical spondylosis with radiculopathy   . Asthma with acute exacerbation 11/02/2012  . Chronic back pain   . ADVERSE DRUG REACTION 08/24/2009  . Asthma 08/19/2009  . PULMONARY NODULE 08/19/2009  . COUGH 08/19/2009  . PURE HYPERCHOLESTEROLEMIA 04/24/2009     Prior to  Admission medications   Medication Sig Start Date End Date Taking? Authorizing Provider  albuterol (PROAIR HFA) 108 (90 BASE) MCG/ACT inhaler Inhale 2 puffs into the lungs every 4 (four) hours as needed. 10/31/12  Yes [name], MD  atorvastatin (LIPITOR) 40 MG tablet TAKE 1 TABLET(40 MG) BY MOUTH DAILY 04/03/18  Yes [name], MD  fluticasone [facility]) 50 MCG/ACT nasal spray Place 2 sprays into both nostrils as needed for allergies. 04/19/17  Yes [provider]  metoprolol succinate (TOPROL-XL) 25 MG 24 hr tablet Take 1 tablet by mouth daily 12/28/17  Yes [name], MD  Multiple Vitamins-Minerals (ICAPS AREDS 2 PO) Take by mouth.   Yes [provider]  Omega-3 Fatty Acids (FISH OIL) 1200 MG CAPS Take 1,200-2,400 mg by mouth 2 (two) times daily. Takes 1200 mg in the morning and 2400 mg in the evening   Yes [provider]  omeprazole (PRILOSEC) 40 MG capsule Take 1 capsule by mouth once daily 08/04/17  Yes Pickard, Cammie Mcgee, MD  rivaroxaban (XARELTO) 20 MG TABS tablet Take 1 tablet by mouth daily with supper 04/03/18  Yes [name], MD  tamsulosin (FLOMAX) 0.4 MG CAPS capsule Take 1 capsule (0.4 mg total) by mouth daily. 04/19/17  Yes [name], MD  traMADol (ULTRAM) 50 MG tablet Take by mouth every  6 (six) hours as needed.   Yes [provider]     Allergies  Allergen Reactions  . Codeine Nausea And Vomiting     Family History  Problem Relation Age of Onset  . Heart failure Mother   . Heart attack Maternal Uncle      Social History   Socioeconomic History  . Marital status: Married    Spouse name: Not on file  . Number of children: Not on file  . Years of education: Not on file  . Highest education level: Not on file  Occupational History  . Not on file  Social Needs  . Financial resource strain: Not on file  . Food insecurity:    Worry: Not on file    Inability: Not on file  . Transportation needs:      Medical: Not on file    Non-medical: Not on file  Tobacco Use  . Smoking status: Never Smoker  . Smokeless tobacco: Never Used  Substance and Sexual Activity  . Alcohol use: No  . Drug use: No  . Sexual activity: Not on file    Comment: married, retired.  Lifestyle  . Physical activity:    Days per week: Not on file    Minutes per session: Not on file  . Stress: Not on file  Relationships  . Social connections:    Talks on phone: Not on file    Gets together: Not on file    Attends religious service: Not on file    Active member of club or organization: Not on file    Attends meetings of clubs or organizations: Not on file    Relationship status: Not on file  . Intimate partner violence:    Fear of current or ex partner: Not on file    Emotionally abused: Not on file    Physically abused: Not on file    Forced sexual activity: Not on file  Other Topics Concern  . Not on file  Social History Narrative  . Not on file     Review of Systems  Constitutional: Negative.   HENT: Negative.   Eyes: Negative.   Respiratory: Negative.   Cardiovascular: Negative.   Gastrointestinal: Negative.   Endocrine: Negative.   Genitourinary: Negative.   Musculoskeletal: Negative.   Skin: Negative.   Allergic/Immunologic: Negative.   Neurological: Negative.   Hematological: Negative.   Psychiatric/Behavioral: Negative.   All other systems reviewed and are negative.      Objective:    Vitals:   05/03/18 1121  BP: 130/90  Pulse: 74  Resp: 15  Temp: 97.8 F (36.6 C)  TempSrc: Oral  SpO2: 95%  Weight: 212 lb 4 oz (96.3 kg)  Height: 5' 10.5" (1.791 m)      Physical Exam Vitals signs and nursing note reviewed.  Constitutional:      General: He is not in acute distress.    Appearance: Normal appearance. He is well-developed. He is ill-appearing (mildly). He is not toxic-appearing or diaphoretic.  HENT:     Head: Normocephalic and atraumatic.     Jaw: No trismus.      Salivary Glands: Right salivary gland is not diffusely enlarged. Left salivary gland is not diffusely enlarged or tender.     Right Ear: Tympanic membrane, ear canal and external ear normal.     Left Ear: Ear canal and external ear normal. Tympanic membrane is scarred.     Nose: Nasal tenderness, mucosal edema, congestion and rhinorrhea (profuse  clear) present.     Right Turbinates: Enlarged and swollen.     Left Turbinates: Enlarged and swollen.     Right Sinus: Maxillary sinus tenderness present. No frontal sinus tenderness.     Left Sinus: Maxillary sinus tenderness present. No frontal sinus tenderness.     Mouth/Throat:     Mouth: Mucous membranes are not pale, not dry and not cyanotic.     Pharynx: Uvula midline. No oropharyngeal exudate, posterior oropharyngeal erythema or uvula swelling.     Tonsils: No tonsillar exudate or tonsillar abscesses.  Eyes:     General: Lids are normal.        Right eye: No discharge.        Left eye: No discharge.     Conjunctiva/sclera: Conjunctivae normal.     Pupils: Pupils are equal, round, and reactive to light.  Neck:     Musculoskeletal: Normal range of motion and neck supple.     Trachea: Trachea and phonation normal. No tracheal deviation.  Cardiovascular:     Rate and Rhythm: Normal rate and regular rhythm.     Pulses: Normal pulses.          Radial pulses are 2+ on the right side and 2+ on the left side.     Heart sounds: Normal heart sounds. No murmur. No friction rub. No gallop.   Pulmonary:     Effort: Pulmonary effort is normal. No tachypnea, accessory muscle usage or respiratory distress.     Breath sounds: No stridor. Wheezing present. No decreased breath sounds, rhonchi or rales.  Abdominal:     General: Bowel sounds are normal. There is no distension.     Palpations: Abdomen is soft.     Tenderness: There is no abdominal tenderness. There is no guarding or rebound.  Musculoskeletal: Normal range of motion.     Right lower leg:  No edema.     Left lower leg: No edema.  Lymphadenopathy:     Cervical: No cervical adenopathy.  Skin:    General: Skin is warm and dry.     Capillary Refill: Capillary refill takes less than 2 seconds.     Coloration: Skin is not pale.     Findings: No rash.     Nails: There is no clubbing.   Neurological:     Mental Status: He is alert and oriented to person, place, and time.     Motor: No abnormal muscle tone.     Coordination: Coordination normal.     Gait: Gait normal.  Psychiatric:        Speech: Speech normal.        Behavior: Behavior normal. Behavior is cooperative.           Assessment & Plan:      ICD-10-CM   1. Acute bacterial rhinosinusitis J01.90 doxycycline (VIBRA-TABS) 100 MG tablet   B96.89   2. Mild intermittent asthmatic bronchitis with acute exacerbation J45.21 predniSONE (DELTASONE) 20 MG tablet    Albuterol Sulfate 108 (90 Base) MCG/ACT AEPB    guaiFENesin (MUCINEX) 600 MG 12 hr tablet    benzonatate (TESSALON) 100 MG capsule    77 y/o male with URI sx for 5-6 d that acutely worsened -his history and physical exam are consistent with acute bacterial sinusitis which I will treat with doxycycline  He also endorses productive cough with gray sputum that is turned brown and burning in his central chest he has not tried his inhaler because he does not feel  wheezy but he is having coughing fits particularly at night when laying down.  Symptoms are consistent with acute bronchitis will treat with steroid burst and encouraged him to use his inhaler.  Also you to use Mucinex, over-the-counter medications for cough suppressant or he can try [employer].  Did have wheeze on exam although he does not feel particularly wheezy so I did put him to use his inhaler every 4-6 hours for the next couple days and then he can decrease afterwards and use as needed for coughing fits for wheeze and shortness of breath.  Patient is elderly does have a history of A. Fib, denies  any chest pain, denies any shortness of breath orthopnea palpitations or PND, he had no lower extremity edema, his pulses were regular, although he appeared slightly ill he did look very stable and nontoxic.  Urged him to follow-up closely if not feeling significantly improved in the next 1- 2 weeks   Delsa Grana, PA-C 05/03/18 11:36 AM

## 2018-05-15 ENCOUNTER — Other Ambulatory Visit: Payer: Self-pay

## 2018-05-15 MED ORDER — RIVAROXABAN 20 MG PO TABS: ORAL_TABLET | ORAL | 1 refills | Status: DC

## 2018-05-15 NOTE — Telephone Encounter (Signed)
Pt last saw Dr Irish Lack 07/12/17, last labs 11/23/17 Creat 1.06, age 78, weight 96.3kg, CrCl 78.23, based on CrCl pt is on appropriate dosage of Xarelto 20mg  QD.  Will refill rx.

## 2018-06-06 ENCOUNTER — Other Ambulatory Visit: Payer: Self-pay | Admitting: Family Medicine

## 2018-06-06 DIAGNOSIS — K219 Gastro-esophageal reflux disease without esophagitis: Secondary | ICD-10-CM

## 2018-06-06 MED ORDER — OMEPRAZOLE 40 MG PO CPDR: 40.0000 mg | DELAYED_RELEASE_CAPSULE | Freq: Every day | ORAL | 3 refills | Status: DC

## 2018-06-14 ENCOUNTER — Other Ambulatory Visit: Payer: Self-pay | Admitting: *Deleted

## 2018-06-14 MED ORDER — METOPROLOL SUCCINATE ER 25 MG PO TB24: ORAL_TABLET | ORAL | 2 refills | Status: DC

## 2018-06-15 DIAGNOSIS — M25512 Pain in left shoulder: Secondary | ICD-10-CM | POA: Diagnosis not present

## 2018-07-19 ENCOUNTER — Other Ambulatory Visit: Payer: Self-pay

## 2018-07-20 ENCOUNTER — Ambulatory Visit (INDEPENDENT_AMBULATORY_CARE_PROVIDER_SITE_OTHER): Payer: PPO | Admitting: Family Medicine

## 2018-07-20 ENCOUNTER — Encounter: Payer: Self-pay | Admitting: Family Medicine

## 2018-07-20 VITALS — BP 118/70 | HR 60 | Temp 97.8°F | Resp 16 | Ht 70.5 in | Wt 209.0 lb

## 2018-07-20 DIAGNOSIS — J069 Acute upper respiratory infection, unspecified: Secondary | ICD-10-CM | POA: Diagnosis not present

## 2018-07-20 NOTE — Progress Notes (Signed)
Subjective:    Patient ID: Tyler Allison, male    DOB: December 04, 1940, 78 y.o.   MRN: [id number]  Patient presents today with 3 days of head congestion, rhinorrhea, postnasal drip, and nonproductive cough.  He denies any pain or pressure in his frontal or maxillary sinuses.  He denies any dental pain.  He denies any severe headache.  He denies any shortness of breath.  He denies any chest pain.  He denies any purulent sputum.  He denies any hemoptysis.  He denies any wheezing.  Past Medical History:  Diagnosis Date  . Allergy   . Asthma   . Cervical spondylosis with radiculopathy    left c6 radiculopathy  . Chronic back pain   . GERD (gastroesophageal reflux disease)   . Hyperlipidemia   . Hypertension    No past surgical history on file. Current Outpatient Medications on File Prior to Visit  Medication Sig Dispense Refill  . albuterol (PROAIR HFA) 108 (90 BASE) MCG/ACT inhaler Inhale 2 puffs into the lungs every 4 (four) hours as needed. 8.5 g 3  . Albuterol Sulfate 108 (90 Base) MCG/ACT AEPB Inhale 2 puffs into the lungs every 4 (four) hours as needed (wheeze, SOB, coughing fits). 1 each 1  . atorvastatin (LIPITOR) 40 MG tablet TAKE 1 TABLET(40 MG) BY MOUTH DAILY 90 tablet 2  . benzonatate (TESSALON) 100 MG capsule Take 1 capsule (100 mg total) by mouth 3 (three) times daily as needed for cough. 30 capsule 0  . fluticasone (FLONASE) 50 MCG/ACT nasal spray Place 2 sprays into both nostrils as needed for allergies.  4  . metoprolol succinate (TOPROL-XL) 25 MG 24 hr tablet Take 1 tablet by mouth daily 90 tablet 2  . Multiple Vitamins-Minerals (ICAPS AREDS 2 PO) Take by mouth.    . Omega-3 Fatty Acids (FISH OIL) 1200 MG CAPS Take 1,200-2,400 mg by mouth 2 (two) times daily. Takes 1200 mg in the morning and 2400 mg in the evening    . omeprazole (PRILOSEC) 40 MG capsule Take 1 capsule (40 mg total) by mouth daily. 90 capsule 3  . rivaroxaban (XARELTO) 20 MG TABS tablet Take 1 tablet by mouth  daily with supper 90 tablet 1  . tamsulosin (FLOMAX) 0.4 MG CAPS capsule Take 1 capsule (0.4 mg total) by mouth daily. 90 capsule 3  . traMADol (ULTRAM) 50 MG tablet Take by mouth every 6 (six) hours as needed.     No current facility-administered medications on file prior to visit.    Allergies  Allergen Reactions  . Codeine Nausea And Vomiting   Social History   Socioeconomic History  . Marital status: Married    Spouse name: Not on file  . Number of children: Not on file  . Years of education: Not on file  . Highest education level: Not on file  Occupational History  . Not on file  Social Needs  . Financial resource strain: Not on file  . Food insecurity:    Worry: Not on file    Inability: Not on file  . Transportation needs:    Medical: Not on file    Non-medical: Not on file  Tobacco Use  . Smoking status: Never Smoker  . Smokeless tobacco: Never Used  Substance and Sexual Activity  . Alcohol use: No  . Drug use: No  . Sexual activity: Not on file    Comment: married, retired.  Lifestyle  . Physical activity:    Days per week: Not on  file    Minutes per session: Not on file  . Stress: Not on file  Relationships  . Social connections:    Talks on phone: Not on file    Gets together: Not on file    Attends religious service: Not on file    Active member of club or organization: Not on file    Attends meetings of clubs or organizations: Not on file    Relationship status: Not on file  . Intimate partner violence:    Fear of current or ex partner: Not on file    Emotionally abused: Not on file    Physically abused: Not on file    Forced sexual activity: Not on file  Other Topics Concern  . Not on file  Social History Narrative  . Not on file       Review of Systems  All other systems reviewed and are negative.      Objective:   Physical Exam  HENT:  Nose: No mucosal edema or rhinorrhea. Right sinus exhibits no maxillary sinus tenderness and no  frontal sinus tenderness. Left sinus exhibits no maxillary sinus tenderness and no frontal sinus tenderness.  Mouth/Throat: No oropharyngeal exudate, posterior oropharyngeal edema or posterior oropharyngeal erythema.  Cardiovascular: Normal rate, regular rhythm and normal heart sounds.  Pulmonary/Chest: Effort normal and breath sounds normal. No respiratory distress. He has no wheezes. He has no rales.  Abdominal: Soft. Bowel sounds are normal. He exhibits no distension. There is no abdominal tenderness. There is no rebound and no guarding.  Vitals reviewed.         Assessment & Plan:  URI  Patient appears to have a viral upper respiratory infection.  I recommended tincture of time.  Use Coricidin HBP as needed for head congestion and cough.  He can also use Afrin 2 sprays each nostril twice daily for the next 3 days as needed for head congestion and rhinorrhea.  Anticipate gradual improvement over the next 3 to 4 days.  Recheck next week if no better or sooner if worse

## 2018-07-30 ENCOUNTER — Ambulatory Visit: Payer: PPO | Admitting: Interventional Cardiology

## 2018-08-14 ENCOUNTER — Telehealth: Payer: Self-pay | Admitting: Interventional Cardiology

## 2018-08-14 NOTE — Telephone Encounter (Signed)
NEW MESSAGE   Pt c/o of Chest Pain: STAT if CP now or developed within 24 hours  1. Are you having CP right now? NO  2. Are you experiencing any other symptoms (ex. SOB, nausea, vomiting, sweating)? SOB SINCE December-ASTHMA  3. How long have you been experiencing CP? 1 MONTH  4. Is your CP continuous or coming and going? COMING AND GOING 5. Have you taken Nitroglycerin? NO ?

## 2018-08-14 NOTE — Telephone Encounter (Signed)
Left message for patient to call back  

## 2018-08-15 NOTE — Telephone Encounter (Signed)
F/U Message             Patient is retuning Brittany's call

## 2018-08-15 NOTE — Telephone Encounter (Signed)
Called patient who states that he has had a sinus infection since December. He states that he has had SOB since then. He states that he also has a runny nose and has had a productive cough. He states that he has also had left sided chest pain that lasts a few sec. He states that this happens at rest and moving around. Patient states that he has had some neck pain but also states that he has had neck surgery in the past. He denies palpitations, irregular beats, N/V, diaphoresis, swelling, weight gain, or any other Sx. Patient has been using his albuterol inhaler nasal spray. Patient's HR 65-70 and BP 115/65. Instructed patient to try OTC allergy medicine and cold medicine.   Patient also overdue for his yearly OV. Patient does not have smartphone but agrees to telephone visit. Scheduled with Ermalinda Barrios, PA on 4/15.     Virtual Visit Pre-Appointment Phone Call  Steps For Call:  1. Confirm consent - "In the setting of the current Covid19 crisis, you are scheduled for a (phone or video) visit with your provider on (date) at (time).  Just as we do with many in-office visits, in order for you to participate in this visit, we must obtain consent.  If you'd like, I can send this to your mychart (if signed up) or email for you to review.  Otherwise, I can obtain your verbal consent now.  All virtual visits are billed to your insurance company just like a normal visit would be.  By agreeing to a virtual visit, we'd like you to understand that the technology does not allow for your provider to perform an examination, and thus may limit your provider's ability to fully assess your condition.  Finally, though the technology is pretty good, we cannot assure that it will always work on either your or our end, and in the setting of a video visit, we may have to convert it to a phone-only visit.  In either situation, we cannot ensure that we have a secure connection.  Are you willing to proceed?"  2. Give patient  instructions for WebEx download to smartphone as below if video visit  3. Advise patient to be prepared with any vital sign or heart rhythm information, their current medicines, and a piece of paper and pen handy for any instructions they may receive the day of their visit  4. Inform patient they will receive a phone call 15 minutes prior to their appointment time (may be from unknown caller ID) so they should be prepared to answer  5. Confirm that appointment type is correct in Epic appointment notes (video vs telephone)    TELEPHONE CALL NOTE  Ezechiel Stooksbury Stutz has been deemed a candidate for a follow-up tele-health visit to limit community exposure during the Covid-19 pandemic. I spoke with the patient via phone to ensure availability of phone/video source, confirm preferred email & phone number, and discuss instructions and expectations.  I reminded CODYLEE PATIL to be prepared with any vital sign and/or heart rhythm information that could potentially be obtained via home monitoring, at the time of his visit. I reminded MILLER LIMEHOUSE to expect a phone call at the time of his visit if his visit.  Did the patient verbally acknowledge consent to treatment? [place], RN 08/15/2018 1:48 PM    CONSENT FOR TELE-HEALTH VISIT - PLEASE REVIEW  I hereby voluntarily request, consent and authorize [facility] and its employed or contracted physicians, physician  assistants, nurse practitioners or other licensed health care professionals (the Practitioner), to provide me with telemedicine health care services (the "Services") as deemed necessary by the treating Practitioner. I acknowledge and consent to receive the Services by the Practitioner via telemedicine. I understand that the telemedicine visit will involve communicating with the Practitioner through live audiovisual communication technology and the disclosure of certain medical information by electronic transmission. I acknowledge  that I have been given the opportunity to request an in-person assessment or other available alternative prior to the telemedicine visit and am voluntarily participating in the telemedicine visit.  I understand that I have the right to withhold or withdraw my consent to the use of telemedicine in the course of my care at any time, without affecting my right to future care or treatment, and that the Practitioner or I may terminate the telemedicine visit at any time. I understand that I have the right to inspect all information obtained and/or recorded in the course of the telemedicine visit and may receive copies of available information for a reasonable fee.  I understand that some of the potential risks of receiving the Services via telemedicine include:  Marland Kitchen Delay or interruption in medical evaluation due to technological equipment failure or disruption; . Information transmitted may not be sufficient (e.g. poor resolution of images) to allow for appropriate medical decision making by the Practitioner; and/or  . In rare instances, security protocols could fail, causing a breach of personal health information.  Furthermore, I acknowledge that it is my responsibility to provide information about my medical history, conditions and care that is complete and accurate to the best of my ability. I acknowledge that Practitioner's advice, recommendations, and/or decision may be based on factors not within their control, such as incomplete or inaccurate data provided by me or distortions of diagnostic images or specimens that may result from electronic transmissions. I understand that the practice of medicine is not an exact science and that Practitioner makes no warranties or guarantees regarding treatment outcomes. I acknowledge that I will receive a copy of this consent concurrently upon execution via email to the email address I last provided but may also request a printed copy by calling the office of [facility].    I understand that my insurance will be billed for this visit.   I have read or had this consent read to me. . I understand the contents of this consent, which adequately explains the benefits and risks of the Services being provided via telemedicine.  . I have been provided ample opportunity to ask questions regarding this consent and the Services and have had my questions answered to my satisfaction. . I give my informed consent for the services to be provided through the use of telemedicine in my medical care  By participating in this telemedicine visit I agree to the above.

## 2018-08-21 DIAGNOSIS — R300 Dysuria: Secondary | ICD-10-CM | POA: Diagnosis not present

## 2018-08-21 DIAGNOSIS — R490 Dysphonia: Secondary | ICD-10-CM | POA: Diagnosis not present

## 2018-08-21 DIAGNOSIS — R079 Chest pain, unspecified: Secondary | ICD-10-CM | POA: Diagnosis not present

## 2018-08-21 DIAGNOSIS — H6993 Unspecified Eustachian tube disorder, bilateral: Secondary | ICD-10-CM | POA: Diagnosis not present

## 2018-08-21 DIAGNOSIS — J310 Chronic rhinitis: Secondary | ICD-10-CM | POA: Diagnosis not present

## 2018-08-21 DIAGNOSIS — N898 Other specified noninflammatory disorders of vagina: Secondary | ICD-10-CM | POA: Diagnosis not present

## 2018-08-21 NOTE — Progress Notes (Signed)
Virtual Visit via Telephone Note   This visit type was conducted due to national recommendations for restrictions regarding the COVID-19 Pandemic (e.g. social distancing) in an effort to limit this patient's exposure and mitigate transmission in our community.  Due to his co-morbid illnesses, this patient is at least at moderate risk for complications without adequate follow up.  This format is felt to be most appropriate for this patient at this time.  The patient did not have access to video technology/had technical difficulties with video requiring transitioning to audio format only (telephone).  All issues noted in this document were discussed and addressed.  No physical exam could be performed with this format.  Please refer to the patient's chart for his  consent to telehealth for [facility].   Evaluation Performed:  Follow-up visit  Date:  08/22/2018   ID:  Tyler, Allison 12-21-1940, MRN [id number]  Patient Location: Home  Provider Location: Home  PCP:  [name], MD  Cardiologist:  Larae Grooms, MD   Electrophysiologist:  None   Chief Complaint:  Short of breath  History of Present Illness:    Tyler Allison is a 78 y.o. male with with history of PAF found incidentally during neck surgery and then on follow-up Holter monitor in 2017.  He also had some bradycardia at 3 AM presumably during sleep with a 2-second pause.  Heart rates as high as 142 he was asymptomatic with.  Patient was placed on Xarelto CHA2DS2-VASc  of 2.  He also has hyperlipidemia.  Last saw Dr. Irish Lack 07/2017 and was in normal sinus rhythm.  LDL 78-10/21/19.  Back in Dec he had a sinus infection treated with antibiotics and steroids. He had another cold in March but no antibiotics. Patient complains of shortness of breath since the first of the year. He's been using his inhalers that don't need to help.Saw an ENT yest for eardrum and everything ok.Last year he could walk 2 miles/ day but now  can't walk to the mailbox without panting. Wheezes terrible at night. No ankle edema. Has lost 5 lbs. Occasionally has sharp shooting chest pain that is fleeting. Denies heavy pressure or tightness.Doesn't think he's had recurrent Afib.  The patient does not have symptoms concerning for COVID-19 infection (fever, chills, cough, or new shortness of breath).    Past Medical History:  Diagnosis Date  . Allergy   . Asthma   . Cervical spondylosis with radiculopathy    left c6 radiculopathy  . Chronic back pain   . GERD (gastroesophageal reflux disease)   . Hyperlipidemia   . Hypertension    No past surgical history on file.   Current Meds  Medication Sig  . albuterol (PROAIR HFA) 108 (90 BASE) MCG/ACT inhaler Inhale 2 puffs into the lungs every 4 (four) hours as needed.  . Albuterol Sulfate 108 (90 Base) MCG/ACT AEPB Inhale 2 puffs into the lungs every 4 (four) hours as needed (wheeze, SOB, coughing fits).  Marland Kitchen atorvastatin (LIPITOR) 40 MG tablet TAKE 1 TABLET(40 MG) BY MOUTH DAILY  . fluticasone (FLONASE) 50 MCG/ACT nasal spray Place 2 sprays into both nostrils as needed for allergies.  . metoprolol succinate (TOPROL-XL) 25 MG 24 hr tablet Take 1 tablet by mouth daily  . Multiple Vitamins-Minerals (ICAPS AREDS 2 PO) Take by mouth.  . Omega-3 Fatty Acids (FISH OIL) 1200 MG CAPS Take 1,200-2,400 mg by mouth 2 (two) times daily. Takes 1200 mg in the morning and 2400 mg in the  evening  . omeprazole (PRILOSEC) 40 MG capsule Take 1 capsule (40 mg total) by mouth daily.  . rivaroxaban (XARELTO) 20 MG TABS tablet Take 1 tablet by mouth daily with supper  . tamsulosin (FLOMAX) 0.4 MG CAPS capsule Take 1 capsule (0.4 mg total) by mouth daily.  . traMADol (ULTRAM) 50 MG tablet Take by mouth every 6 (six) hours as needed.     Allergies:   Codeine   Social History   Tobacco Use  . Smoking status: Never Smoker  . Smokeless tobacco: Never Used  Substance Use Topics  . Alcohol use: No  . Drug  use: No     Family Hx: The patient's family history includes Heart attack in his maternal uncle; Heart failure in his mother.  ROS:   Please see the history of present illness.    Review of Systems  Constitution: Positive for weight loss.  HENT:       Ruptured ear drum-sees ent  Cardiovascular: Positive for dyspnea on exertion and orthopnea.  Respiratory: Positive for sleep disturbances due to breathing and wheezing.     All other systems reviewed and are negative.   Prior CV studies:   The following studies were reviewed today: Echo 2017 Study Conclusions   - Left ventricle: The cavity size was normal. Wall thickness was   normal. Systolic function was normal. The estimated ejection   fraction was in the range of 55% to 60%. Wall motion was normal;   there were no regional wall motion abnormalities. Doppler   parameters are consistent with abnormal left ventricular   relaxation (grade 1 diastolic dysfunction). - Aortic valve: There was trivial regurgitation. - Mitral valve: Systolic bowing without prolapse. There was mild   regurgitation.   Impressions:   - Normal LV systolic function; grade 1 diastolic dysfunction; trace   AI; mild MR and TR.     Labs/Other Tests and Data Reviewed:    EKG:  No ECG reviewed.  Recent Labs: 11/23/2017: ALT 14; BUN 13; Creat 1.06; Hemoglobin 14.1; Platelets 235; Potassium 4.6; Sodium 137   Recent Lipid Panel Lab Results  Component Value Date/Time   CHOL 138 02/26/2018 08:02 AM   TRIG 104 02/26/2018 08:02 AM   HDL 41 02/26/2018 08:02 AM   CHOLHDL 3.4 02/26/2018 08:02 AM   LDLCALC 78 02/26/2018 08:02 AM    Wt Readings from Last 3 Encounters:  08/22/18 205 lb (93 kg)  07/20/18 209 lb (94.8 kg)  05/03/18 212 lb 4 oz (96.3 kg)     Objective:    Vital Signs:  BP 127/65   Pulse 67   Ht 5' 10.5" (1.791 m)   Wt 205 lb (93 kg)   BMI 29.00 kg/m      ASSESSMENT & PLAN:    1. PAF on Xarelto and metoprolol creatinine normal  1.11 November 2017 2. Hyperlipidemia LDL 78 down from 177 when checked 02/2018 lipitor 40 mg daily 3. Shortness of breath with little activity since Jan-has seen PCP twice. Inhalers not working. Thinks he's in NSR. Can't walk to the mailbox-was walking 2 mi/day.Will schedule 2Decho and dod visit in office to assess.  COVID-19 Education: The signs and symptoms of COVID-19 were discussed with the patient and how to seek care for testing (follow up with PCP or arrange E-visit).  The importance of social distancing was discussed today.  Time:   Today, I have spent 12 minutes with the patient with telehealth technology discussing the above problems.  Medication Adjustments/Labs and Tests Ordered: Current medicines are reviewed at length with the patient today.  Concerns regarding medicines are outlined above.   Tests Ordered: Orders Placed This Encounter  Procedures  . ECHOCARDIOGRAM COMPLETE    Medication Changes: No orders of the defined types were placed in this encounter.   Disposition:  Follow up in 1 week(s) or sooner with DOD and echo and ekg in office  Signed, Ermalinda Barrios, PA-C  08/22/2018 11:56 AM    [place]

## 2018-08-22 ENCOUNTER — Telehealth (INDEPENDENT_AMBULATORY_CARE_PROVIDER_SITE_OTHER): Payer: PPO | Admitting: Physician Assistant

## 2018-08-22 ENCOUNTER — Other Ambulatory Visit: Payer: Self-pay

## 2018-08-22 ENCOUNTER — Encounter: Payer: Self-pay | Admitting: Physician Assistant

## 2018-08-22 VITALS — BP 127/65 | HR 67 | Ht 70.5 in | Wt 205.0 lb

## 2018-08-22 DIAGNOSIS — R0602 Shortness of breath: Secondary | ICD-10-CM | POA: Diagnosis not present

## 2018-08-22 DIAGNOSIS — I480 Paroxysmal atrial fibrillation: Secondary | ICD-10-CM | POA: Diagnosis not present

## 2018-08-22 DIAGNOSIS — E782 Mixed hyperlipidemia: Secondary | ICD-10-CM | POA: Diagnosis not present

## 2018-08-22 DIAGNOSIS — Z7189 Other specified counseling: Secondary | ICD-10-CM

## 2018-08-22 NOTE — Patient Instructions (Signed)
Medication Instructions:  Your physician recommends that you continue on your current medications as directed. Please refer to the Current Medication list given to you today.  If you need a refill on your cardiac medications before your next appointment, please call your pharmacy.   Lab work: None Ordered  If you have labs (blood work) drawn today and your tests are completely normal, you will receive your results only by: Marland Kitchen MyChart Message (if you have MyChart) OR . A paper copy in the mail If you have any lab test that is abnormal or we need to change your treatment, we will call you to review the results.  Testing/Procedures: Your physician has requested that you have an echocardiogram on 08/28/18 at 1:00 PM. Please arrive at 12:30 PM. Echocardiography is a painless test that uses sound waves to create images of your heart. It provides your doctor with information about the size and shape of your heart and how well your heart's chambers and valves are working. This procedure takes approximately one hour. There are no restrictions for this procedure.  Follow-Up: . Follow up with Dr. Irish Lack in the office for DOD Visit with an EKG after echocardiogram on 08/28/18 at 2:30 PM  Any Other Special Instructions Will Be Listed Below (If Applicable).

## 2018-08-23 ENCOUNTER — Other Ambulatory Visit: Payer: Self-pay | Admitting: Pharmacist

## 2018-08-23 MED ORDER — RIVAROXABAN 20 MG PO TABS: ORAL_TABLET | ORAL | 0 refills | Status: DC

## 2018-08-23 NOTE — Telephone Encounter (Signed)
Telemedicine on 08/22/2018  Wt 93kg Scr = 1.06 on 11/2017 (needs to repeat before additional refill auth)  Est.CrCl = 74ml/min

## 2018-08-28 ENCOUNTER — Other Ambulatory Visit: Payer: Self-pay

## 2018-08-28 ENCOUNTER — Ambulatory Visit (INDEPENDENT_AMBULATORY_CARE_PROVIDER_SITE_OTHER): Payer: PPO | Admitting: Interventional Cardiology

## 2018-08-28 ENCOUNTER — Encounter: Payer: Self-pay | Admitting: Interventional Cardiology

## 2018-08-28 ENCOUNTER — Ambulatory Visit
Admission: RE | Admit: 2018-08-28 | Discharge: 2018-08-28 | Disposition: A | Discharge status: Home / Self Care | Payer: PPO | Source: Ambulatory Visit | Attending: Interventional Cardiology | Admitting: Interventional Cardiology

## 2018-08-28 ENCOUNTER — Ambulatory Visit (HOSPITAL_COMMUNITY): Payer: PPO | Attending: Cardiovascular Disease

## 2018-08-28 ENCOUNTER — Telehealth: Payer: Self-pay

## 2018-08-28 VITALS — BP 104/62 | HR 65 | Ht 70.5 in | Wt 207.0 lb

## 2018-08-28 DIAGNOSIS — I480 Paroxysmal atrial fibrillation: Secondary | ICD-10-CM | POA: Diagnosis not present

## 2018-08-28 DIAGNOSIS — R0602 Shortness of breath: Secondary | ICD-10-CM | POA: Insufficient documentation

## 2018-08-28 DIAGNOSIS — Z7901 Long term (current) use of anticoagulants: Secondary | ICD-10-CM

## 2018-08-28 DIAGNOSIS — E782 Mixed hyperlipidemia: Secondary | ICD-10-CM | POA: Diagnosis not present

## 2018-08-28 MED ORDER — PERFLUTREN LIPID MICROSPHERE
1.0000 mL | INTRAVENOUS | Status: AC | PRN
  Administered 2018-08-28: 2 mL via INTRAVENOUS

## 2018-08-28 NOTE — Telephone Encounter (Signed)
Pt received 2 week sample of xarelto 20 mg today.

## 2018-08-28 NOTE — Progress Notes (Signed)
Cardiology Office Note   Date:  08/28/2018   ID:  Tyler, Allison [birth year], MRN [id number]  PCP:  Tyler Frizzle, MD    No chief complaint on file.  AFib  Wt Readings from Last 3 Encounters:  08/28/18 207 lb (93.9 kg)  08/22/18 205 lb (93 kg)  07/20/18 209 lb (94.8 kg)       History of Present Illness: Tyler Allison is a 78 y.o. male  Who had neck surgery on 01/27/16. The anesthesia MD mentioned some irregular heart rhythm. Dr. Dennard Schaumann did a Holter and this showed PAF 15 days post op. He had some bradycardia at 3AM, presumably during sleep, (2 second pause). He was started on Xarelto.  AFib rate has been as high as 142 bpm. Even with that, he did not have sx.   In 12/19, he had a bad sinus infection.  Since then, he has not had the same stamina.  He is wheezing.  He has had lung issues.    Sx of DOE have persisted. He can no longer walk a few miles like he used to.    ENT checked his airway and there were no issues per his report.    He used to have allergies.  He used to get shots, but now takes an OTC tablet.   He starts the morning with a cough that brings up some phlegm in the morning.    Denies : Chest pain. Dizziness. Leg edema. Nitroglycerin use. Orthopnea. Palpitations. Paroxysmal nocturnal dyspnea.  Syncope.     Past Medical History:  Diagnosis Date  . Allergy   . Asthma   . Cervical spondylosis with radiculopathy    left c6 radiculopathy  . Chronic back pain   . GERD (gastroesophageal reflux disease)   . Hyperlipidemia   . Hypertension     No past surgical history on file.   Current Outpatient Medications  Medication Sig Dispense Refill  . albuterol (PROAIR HFA) 108 (90 BASE) MCG/ACT inhaler Inhale 2 puffs into the lungs every 4 (four) hours as needed. 8.5 g 3  . atorvastatin (LIPITOR) 40 MG tablet TAKE 1 TABLET(40 MG) BY MOUTH DAILY 90 tablet 2  . fluticasone (FLONASE) 50 MCG/ACT nasal spray Place 2 sprays into both nostrils as  needed for allergies.  4  . metoprolol succinate (TOPROL-XL) 25 MG 24 hr tablet Take 1 tablet by mouth daily 90 tablet 2  . omeprazole (PRILOSEC) 40 MG capsule Take 1 capsule (40 mg total) by mouth daily. 90 capsule 3  . rivaroxaban (XARELTO) 20 MG TABS tablet Take 1 tablet by mouth daily with supper 90 tablet 0  . tamsulosin (FLOMAX) 0.4 MG CAPS capsule Take 1 capsule (0.4 mg total) by mouth daily. 90 capsule 3  . traMADol (ULTRAM) 50 MG tablet Take by mouth every 6 (six) hours as needed.     No current facility-administered medications for this visit.    Facility-Administered Medications Ordered in Other Visits  Medication Dose Route Frequency Provider Last Rate Last Dose  . perflutren lipid microspheres (DEFINITY) IV suspension  1-10 mL Intravenous PRN Jettie Booze, MD   2 mL at 08/28/18 1256    Allergies:   Codeine    Social History:  The patient  reports that he has never smoked. He has never used smokeless tobacco. He reports that he does not drink alcohol or use drugs.   Family History:  The patient's family history includes Heart attack in his maternal uncle;  Heart failure in his mother.    ROS:  Please see the history of present illness.   Otherwise, review of systems are positive for DOE, wheezing.   All other systems are reviewed and negative.    PHYSICAL EXAM: VS:  BP 104/62   Pulse 65   Ht 5' 10.5" (1.791 m)   Wt 207 lb (93.9 kg)   SpO2 96%   BMI 29.28 kg/m  , BMI Body mass index is 29.28 kg/m. GEN: Well nourished, well developed, in no acute distress  HEENT: normal  Neck: no JVD, carotid bruits, or masses Cardiac: RRR; no murmurs, rubs, or gallops,no edema  Respiratory: Mild wheezing to auscultation bilaterally, normal work of breathing GI: soft, nontender, nondistended, + BS MS: no deformity or atrophy  Skin: warm and dry, no rash Neuro:  Strength and sensation are intact Psych: euthymic mood, full affect   EKG:   The ekg ordered today  demonstrates NSR, n oST changes   Recent Labs: 11/23/2017: ALT 14; BUN 13; Creat 1.06; Hemoglobin 14.1; Platelets 235; Potassium 4.6; Sodium 137   Lipid Panel    Component Value Date/Time   CHOL 138 02/26/2018 0802   TRIG 104 02/26/2018 0802   HDL 41 02/26/2018 0802   CHOLHDL 3.4 02/26/2018 0802   VLDL 31 (H) 03/13/2015 0816   LDLCALC 78 02/26/2018 0802     Other studies Reviewed: Additional studies/ records that were reviewed today with results demonstrating: I personally reviewed his echocardiogram from today.  LV function appeared normal.  He appeared to be normal sinus rhythm as well..   ASSESSMENT AND PLAN:  1. AFib: Back in NSR.  No sign of volume overload.  Xarelto for stroke prevention.  No bleeding problems.  Check CBC due to anticoagulated status. 2. Hyperlipidemia: LDL 78 in 10/19. Continue atorvastatin. 3. Asthma/shortness of breath: I suspect the symptoms are more pulmonary related.  Will check labs including BNP.  Will also check chest x-ray.  If no evidence of volume overloaded, will consider inhaled steroid.  Will defer to PCP.  He is already taking an over-the-counter allergy medicine. 4. Importance of social distancing was stressed to avoid coronavirus.   Current medicines are reviewed at length with the patient today.  The patient concerns regarding his medicines were addressed.  The following changes have been made:  No change  Labs/ tests ordered today include:  No orders of the defined types were placed in this encounter.   Recommend 150 minutes/week of aerobic exercise Low fat, low carb, high fiber diet recommended  Disposition:   FU based on test results   Signed, Larae Grooms, MD  08/28/2018 1:36 PM    [facility] [address], [place], [place]  [address] Phone: [phone]; Fax: [phone]

## 2018-08-28 NOTE — Patient Instructions (Addendum)
Medication Instructions:  Your physician recommends that you continue on your current medications as directed. Please refer to the Current Medication list given to you today.  If you need a refill on your cardiac medications before your next appointment, please call your pharmacy.   Lab work: TODAY: CBC, CMET, BNP If you have labs (blood work) drawn today and your tests are completely normal, you will receive your results only by: Marland Kitchen MyChart Message (if you have MyChart) OR . A paper copy in the mail If you have any lab test that is abnormal or we need to change your treatment, we will call you to review the results.  Testing/Procedures: A chest x-ray takes a picture of the organs and structures inside the chest, including the heart, lungs, and blood vessels. This test can show several things, including, whether the heart is enlarges; whether fluid is building up in the lungs; and whether pacemaker / defibrillator leads are still in place.  Chest X-ray Instructions:    1. You may have this done at the [facility], located in the [place] on the 1st floor.    2. You do no have to have an appointment.    3. [place], [place] [id number]        [phone]        Monday - Friday  8:00 am - 5:00 pm   Follow-Up: . Based on test results  Any Other Special Instructions Will Be Listed Below (If Applicable).

## 2018-08-29 ENCOUNTER — Telehealth: Payer: Self-pay | Admitting: Interventional Cardiology

## 2018-08-29 LAB — COMPREHENSIVE METABOLIC PANEL
ALT: 16 IU/L (ref 0–44)
AST: 29 IU/L (ref 0–40)
Albumin/Globulin Ratio: 1.4 (ref 1.2–2.2)
Albumin: 4.5 g/dL (ref 3.7–4.7)
Alkaline Phosphatase: 94 IU/L (ref 39–117)
BUN/Creatinine Ratio: 14 (ref 10–24)
BUN: 13 mg/dL (ref 8–27)
Bilirubin Total: 0.6 mg/dL (ref 0.0–1.2)
CO2: 23 mmol/L (ref 20–29)
Calcium: 9.2 mg/dL (ref 8.6–10.2)
Chloride: 99 mmol/L (ref 96–106)
Creatinine, Ser: 0.92 mg/dL (ref 0.76–1.27)
GFR calc Af Amer: 92 mL/min/{1.73_m2} (ref >59)
GFR calc non Af Amer: 79 mL/min/{1.73_m2} (ref >59)
Globulin, Total: 3.2 g/dL (ref 1.5–4.5)
Glucose: 99 mg/dL (ref 65–99)
Potassium: 4.3 mmol/L (ref 3.5–5.2)
Sodium: 138 mmol/L (ref 134–144)
Total Protein: 7.7 g/dL (ref 6.0–8.5)

## 2018-08-29 LAB — CBC
Hematocrit: 44 % (ref 37.5–51.0)
Hemoglobin: 15.5 g/dL (ref 13.0–17.7)
MCH: 30.6 pg (ref 26.6–33.0)
MCHC: 35.2 g/dL (ref 31.5–35.7)
MCV: 87 fL (ref 79–97)
Platelets: 277 10*3/uL (ref 150–450)
RBC: 5.06 x10E6/uL (ref 4.14–5.80)
RDW: 13.2 % (ref 11.6–15.4)
WBC: 11.9 10*3/uL — ABNORMAL HIGH (ref 3.4–10.8)

## 2018-08-29 LAB — PRO B NATRIURETIC PEPTIDE: NT-Pro BNP: 187 pg/mL (ref 0–486)

## 2018-08-29 NOTE — Telephone Encounter (Signed)
Per pt returning this office call please give him a call back.   Pt stated thanks.

## 2018-08-29 NOTE — Telephone Encounter (Signed)
-----   Message from Jettie Booze, MD sent at 08/29/2018  9:20 AM EDT ----- Labs normal. No evidence of heart failure

## 2018-08-29 NOTE — Telephone Encounter (Signed)
-----   Message from Jettie Booze, MD sent at 08/28/2018  4:57 PM EDT ----- No pneumonia or pulmonary edema.

## 2018-08-29 NOTE — Telephone Encounter (Signed)
Called and made patient aware of lab, echo, and CXR results. Patient verbalized understanding and thanked me for the call.

## 2018-08-29 NOTE — Telephone Encounter (Signed)
-----   Message from Imogene Burn, [place] sent at 08/29/2018  8:35 AM EDT ----- Echo shows normal heart function, no significant abnormality to cause shortness of breath.

## 2018-09-03 ENCOUNTER — Ambulatory Visit (INDEPENDENT_AMBULATORY_CARE_PROVIDER_SITE_OTHER): Payer: PPO | Admitting: Family Medicine

## 2018-09-03 ENCOUNTER — Other Ambulatory Visit: Payer: Self-pay

## 2018-09-03 DIAGNOSIS — J4540 Moderate persistent asthma, uncomplicated: Secondary | ICD-10-CM | POA: Diagnosis not present

## 2018-09-03 DIAGNOSIS — R0600 Dyspnea, unspecified: Secondary | ICD-10-CM

## 2018-09-03 MED ORDER — PREDNISONE 20 MG PO TABS: ORAL_TABLET | ORAL | 0 refills | Status: DC

## 2018-09-03 NOTE — Progress Notes (Signed)
Subjective:    Patient ID: Tyler Allison, male    DOB: 1941-04-26, 78 y.o.   MRN: [id number] 07/20/18 Patient presents today with 3 days of head congestion, rhinorrhea, postnasal drip, and nonproductive cough.  He denies any pain or pressure in his frontal or maxillary sinuses.  He denies any dental pain.  He denies any severe headache.  He denies any shortness of breath.  He denies any chest pain.  He denies any purulent sputum.  He denies any hemoptysis.  He denies any wheezing.  At that time, my plan was: Patient appears to have a viral upper respiratory infection.  I recommended tincture of time.  Use Coricidin HBP as needed for head congestion and cough.  He can also use Afrin 2 sprays each nostril twice daily for the next 3 days as needed for head congestion and rhinorrhea.  Anticipate gradual improvement over the next 3 to 4 days.  Recheck next week if no better or sooner if worse  09/03/18 Patient is being seen today over the telephone.  He consents to be seen by telephone.  Phone call began at 1020.  Phone call ended at 1031.  Patient was seen as stated above in March and was diagnosed with an upper respiratory infection.  He states that the symptoms have improved except for shortness of breath.  He continues to have shortness of breath every day.  He saw his ENT for ruptured eardrum and his ENT stated that everything looked good when he examined his throat and his nasal passages.  There was no evidence for a sinus infection or postnasal drip.  The shortness of breath was so bad he saw his cardiologist.  His cardiologist felt it was more likely pulmonary in nature.  A pro BNP was normal at 187.  CBC was obtained which did show a mild elevation in his white blood cell count of 11.5 however his hemoglobin was normal at 15.5.  The patient had an echocardiogram that was normal showing no evidence of congestive heart failure.  Ejection fraction was 55 to 60%.  He also had a chest x-ray less than a week  ago that was completely clear with no evidence of pneumonia or pulmonary edema.  Patient states that he is been using his pro-air 2-3 times a day.  He does see benefit from the pro-air when he uses it only for a few hours.  He also hears himself wheeze throughout the day.  When he wakes up in the morning he has a cough for approximately 30 minutes where he clears clear congestion out of his throat.  He denies any hemoptysis.  He denies any pleurisy.  He denies any fevers or chills  Past Medical History:  Diagnosis Date   Allergy    Asthma    Cervical spondylosis with radiculopathy    left c6 radiculopathy   Chronic back pain    GERD (gastroesophageal reflux disease)    Hyperlipidemia    Hypertension    No past surgical history on file. Current Outpatient Medications on File Prior to Visit  Medication Sig Dispense Refill   albuterol (PROAIR HFA) 108 (90 BASE) MCG/ACT inhaler Inhale 2 puffs into the lungs every 4 (four) hours as needed. 8.5 g 3   atorvastatin (LIPITOR) 40 MG tablet TAKE 1 TABLET(40 MG) BY MOUTH DAILY 90 tablet 2   fluticasone (FLONASE) 50 MCG/ACT nasal spray Place 2 sprays into both nostrils as needed for allergies.  4   metoprolol succinate (TOPROL-XL)  25 MG 24 hr tablet Take 1 tablet by mouth daily 90 tablet 2   omeprazole (PRILOSEC) 40 MG capsule Take 1 capsule (40 mg total) by mouth daily. 90 capsule 3   rivaroxaban (XARELTO) 20 MG TABS tablet Take 1 tablet by mouth daily with supper 90 tablet 0   tamsulosin (FLOMAX) 0.4 MG CAPS capsule Take 1 capsule (0.4 mg total) by mouth daily. 90 capsule 3   traMADol (ULTRAM) 50 MG tablet Take by mouth every 6 (six) hours as needed.     No current facility-administered medications on file prior to visit.    Allergies  Allergen Reactions   Codeine Nausea And Vomiting   Social History   Socioeconomic History   Marital status: Married    Spouse name: Not on file   Number of children: Not on file   Years of  education: Not on file   Highest education level: Not on file  Occupational History   Not on file  Social Needs   Financial resource strain: Not on file   Food insecurity:    Worry: Not on file    Inability: Not on file   Transportation needs:    Medical: Not on file    Non-medical: Not on file  Tobacco Use   Smoking status: Never Smoker   Smokeless tobacco: Never Used  Substance and Sexual Activity   Alcohol use: No   Drug use: No   Sexual activity: Not on file    Comment: married, retired.  Lifestyle   Physical activity:    Days per week: Not on file    Minutes per session: Not on file   Stress: Not on file  Relationships   Social connections:    Talks on phone: Not on file    Gets together: Not on file    Attends religious service: Not on file    Active member of club or organization: Not on file    Attends meetings of clubs or organizations: Not on file    Relationship status: Not on file   Intimate partner violence:    Fear of current or ex partner: Not on file    Emotionally abused: Not on file    Physically abused: Not on file    Forced sexual activity: Not on file  Other Topics Concern   Not on file  Social History Narrative   Not on file       Review of Systems  All other systems reviewed and are negative.      Objective:    No physical exam could be performed as the patient was seen over the telephone    Assessment & Plan:   Shortness of breath.  I suspect that this is more likely pulmonary and related to his asthma coupled with allergies.  I will start the patient on a prednisone taper pack and add Symbicort 160/4.5, 2 puffs inhaled twice daily and then reassess the patient in 1 week to see if symptoms are improving.

## 2018-09-07 ENCOUNTER — Telehealth: Payer: Self-pay | Admitting: Family Medicine

## 2018-09-07 NOTE — Telephone Encounter (Signed)
I would like to visibly see him next week

## 2018-09-07 NOTE — Telephone Encounter (Signed)
Patient is calling to say he is doing somewhat better after he spoke to dr pickard, but I still having breathing issues and wheezing at night, also wants to ask questions about how long he needs to be taking the prednisone  [phone]

## 2018-09-07 NOTE — Telephone Encounter (Signed)
See note

## 2018-09-10 ENCOUNTER — Other Ambulatory Visit: Payer: Self-pay

## 2018-09-10 ENCOUNTER — Ambulatory Visit
Admission: RE | Admit: 2018-09-10 | Discharge: 2018-09-10 | Disposition: A | Discharge status: Home / Self Care | Payer: PPO | Source: Ambulatory Visit | Attending: Family Medicine | Admitting: Family Medicine

## 2018-09-10 ENCOUNTER — Ambulatory Visit (INDEPENDENT_AMBULATORY_CARE_PROVIDER_SITE_OTHER): Payer: PPO | Admitting: Family Medicine

## 2018-09-10 VITALS — BP 124/62 | HR 74 | Temp 98.0°F | Resp 18 | Ht 70.5 in | Wt 206.0 lb

## 2018-09-10 DIAGNOSIS — R0602 Shortness of breath: Secondary | ICD-10-CM | POA: Diagnosis not present

## 2018-09-10 DIAGNOSIS — J4540 Moderate persistent asthma, uncomplicated: Secondary | ICD-10-CM | POA: Diagnosis not present

## 2018-09-10 DIAGNOSIS — R0600 Dyspnea, unspecified: Secondary | ICD-10-CM

## 2018-09-10 DIAGNOSIS — R05 Cough: Secondary | ICD-10-CM | POA: Diagnosis not present

## 2018-09-10 MED ORDER — PANTOPRAZOLE SODIUM 40 MG PO TBEC: 40.0000 mg | DELAYED_RELEASE_TABLET | Freq: Two times a day (BID) | ORAL | 3 refills | Status: DC

## 2018-09-10 NOTE — Progress Notes (Signed)
Subjective:    Patient ID: Tyler Allison, male    DOB: 1940/12/14, 78 y.o.   MRN: [id number] 07/20/18 Patient presents today with 3 days of head congestion, rhinorrhea, postnasal drip, and nonproductive cough.  He denies any pain or pressure in his frontal or maxillary sinuses.  He denies any dental pain.  He denies any severe headache.  He denies any shortness of breath.  He denies any chest pain.  He denies any purulent sputum.  He denies any hemoptysis.  He denies any wheezing.  At that time, my plan was: Patient appears to have a viral upper respiratory infection.  I recommended tincture of time.  Use Coricidin HBP as needed for head congestion and cough.  He can also use Afrin 2 sprays each nostril twice daily for the next 3 days as needed for head congestion and rhinorrhea.  Anticipate gradual improvement over the next 3 to 4 days.  Recheck next week if no better or sooner if worse  09/03/18 Patient is being seen today over the telephone.  He consents to be seen by telephone.  Phone call began at 1020.  Phone call ended at 1031.  Patient was seen as stated above in March and was diagnosed with an upper respiratory infection.  He states that the symptoms have improved except for shortness of breath.  He continues to have shortness of breath every day.  He saw his ENT for ruptured eardrum and his ENT stated that everything looked good when he examined his throat and his nasal passages.  There was no evidence for a sinus infection or postnasal drip.  The shortness of breath was so bad he saw his cardiologist.  His cardiologist felt it was more likely pulmonary in nature.  A pro BNP was normal at 187.  CBC was obtained which did show a mild elevation in his white blood cell count of 11.5 however his hemoglobin was normal at 15.5.  The patient had an echocardiogram that was normal showing no evidence of congestive heart failure.  Ejection fraction was 55 to 60%.  He also had a chest x-ray less than a week  ago that was completely clear with no evidence of pneumonia or pulmonary edema.  Patient states that he is been using his pro-air 2-3 times a day.  He does see benefit from the pro-air when he uses it only for a few hours.  He also hears himself wheeze throughout the day.  When he wakes up in the morning he has a cough for approximately 30 minutes where he clears clear congestion out of his throat.  He denies any hemoptysis.  He denies any pleurisy.  He denies any fevers or chills.  At that time, my plan was: Shortness of breath.  I suspect that this is more likely pulmonary and related to his asthma coupled with allergies.  I will start the patient on a prednisone taper pack and add Symbicort 160/4.5, 2 puffs inhaled twice daily and then reassess the patient in 1 week to see if symptoms are improving.  09/10/18 Patient continues to report shortness of breath and wheezing.  He states that the prednisone helped some but he still feels winded for no reason.  He also reports feeling constantly hoarse.  He has a scratchy voice.  He reports cough and irritation worse when lying supine in bed.  He denies any fever.  He denies any chills.  He denies any pleurisy.  He denies any hemoptysis.  Patient is  definitely hoarse today.  On physical exam, he has bibasilar crackles which are prominent in both lungs however chest x-ray dated April 21 was clear.  He is currently taking Flonase for seasonal allergies as well as an over-the-counter allergy medication.  Past Medical History:  Diagnosis Date  . Allergy   . Asthma   . Cervical spondylosis with radiculopathy    left c6 radiculopathy  . Chronic back pain   . GERD (gastroesophageal reflux disease)   . Hyperlipidemia   . Hypertension    No past surgical history on file. Current Outpatient Medications on File Prior to Visit  Medication Sig Dispense Refill  . albuterol (PROAIR HFA) 108 (90 BASE) MCG/ACT inhaler Inhale 2 puffs into the lungs every 4 (four) hours  as needed. 8.5 g 3  . atorvastatin (LIPITOR) 40 MG tablet TAKE 1 TABLET(40 MG) BY MOUTH DAILY 90 tablet 2  . fluticasone (FLONASE) 50 MCG/ACT nasal spray Place 2 sprays into both nostrils as needed for allergies.  4  . metoprolol succinate (TOPROL-XL) 25 MG 24 hr tablet Take 1 tablet by mouth daily 90 tablet 2  . omeprazole (PRILOSEC) 40 MG capsule Take 1 capsule (40 mg total) by mouth daily. 90 capsule 3  . rivaroxaban (XARELTO) 20 MG TABS tablet Take 1 tablet by mouth daily with supper 90 tablet 0  . tamsulosin (FLOMAX) 0.4 MG CAPS capsule Take 1 capsule (0.4 mg total) by mouth daily. 90 capsule 3  . traMADol (ULTRAM) 50 MG tablet Take by mouth every 6 (six) hours as needed.     No current facility-administered medications on file prior to visit.    Allergies  Allergen Reactions  . Codeine Nausea And Vomiting   Social History   Socioeconomic History  . Marital status: Married    Spouse name: Not on file  . Number of children: Not on file  . Years of education: Not on file  . Highest education level: Not on file  Occupational History  . Not on file  Social Needs  . Financial resource strain: Not on file  . Food insecurity:    Worry: Not on file    Inability: Not on file  . Transportation needs:    Medical: Not on file    Non-medical: Not on file  Tobacco Use  . Smoking status: Never Smoker  . Smokeless tobacco: Never Used  Substance and Sexual Activity  . Alcohol use: No  . Drug use: No  . Sexual activity: Not on file    Comment: married, retired.  Lifestyle  . Physical activity:    Days per week: Not on file    Minutes per session: Not on file  . Stress: Not on file  Relationships  . Social connections:    Talks on phone: Not on file    Gets together: Not on file    Attends religious service: Not on file    Active member of club or organization: Not on file    Attends meetings of clubs or organizations: Not on file    Relationship status: Not on file  .  Intimate partner violence:    Fear of current or ex partner: Not on file    Emotionally abused: Not on file    Physically abused: Not on file    Forced sexual activity: Not on file  Other Topics Concern  . Not on file  Social History Narrative  . Not on file       Review of Systems  All other systems reviewed and are negative.      Objective:   Vital signs are noted Physical exam was performed today.  Patient has a regular a regular heart rhythm.  He has bibasilar crackles left greater than right.  There is no pitting edema in extremities his extremities.  There is no JVD.  Abdomen is soft nondistended nontender with normal bowel sounds.  Patient has good air exchange.  There is no expiratory wheezing.  He has no rhinorrhea.  There is no sinus tenderness to palpation.  There is no mucosal edema.  Oropharynx is clear on examination.  There is no lymphadenopathy in the neck.  There is no stridor.  Trachea is midline. Assessment & Plan:   SOB (shortness of breath) - Plan: DG Chest 2 View  Dyspnea, unspecified type  Moderate persistent asthma, unspecified whether complicated  Patient has an abnormal physical exam.  He has bibasilar crackles on both sides.  I would like to obtain a chest x-ray for this although chest x-ray in April 21 was clear.  If the chest x-ray this time is clear, I would recommend a high resolution CT scan to evaluate for interstitial lung disease given the abnormal breath sounds.  However my leading suspicion is silent laryngo-esophageal reflux likely exacerbating his asthma and also contributing to his hoarseness.  Therefore I will start the patient on Protonix 40 mg twice daily and discontinue Prilosec.  I also recommended that he elevate the head of his bed 4 to 6 inches and then monitor for the next 2 to 3 weeks to see if his symptoms improve pending the results of his chest x-ray and/or CT scan

## 2018-09-10 NOTE — Telephone Encounter (Signed)
I called patient and informed him that Dr.Pickard would like to see him one day this week. Patient verbalized understanding and is scheduled for today at 2:15PM

## 2018-09-11 ENCOUNTER — Other Ambulatory Visit: Payer: Self-pay | Admitting: Family Medicine

## 2018-09-11 DIAGNOSIS — R0602 Shortness of breath: Secondary | ICD-10-CM

## 2018-09-11 DIAGNOSIS — R0689 Other abnormalities of breathing: Secondary | ICD-10-CM

## 2018-09-11 DIAGNOSIS — Z6829 Body mass index (BMI) 29.0-29.9, adult: Secondary | ICD-10-CM | POA: Diagnosis not present

## 2018-09-11 DIAGNOSIS — M542 Cervicalgia: Secondary | ICD-10-CM | POA: Diagnosis not present

## 2018-09-11 DIAGNOSIS — I10 Essential (primary) hypertension: Secondary | ICD-10-CM | POA: Diagnosis not present

## 2018-09-24 ENCOUNTER — Ambulatory Visit
Admission: RE | Admit: 2018-09-24 | Discharge: 2018-09-24 | Disposition: A | Discharge status: Home / Self Care | Payer: PPO | Source: Ambulatory Visit | Attending: Family Medicine | Admitting: Family Medicine

## 2018-09-24 ENCOUNTER — Other Ambulatory Visit: Payer: Self-pay | Admitting: Family Medicine

## 2018-09-24 DIAGNOSIS — R0602 Shortness of breath: Secondary | ICD-10-CM

## 2018-09-24 DIAGNOSIS — R0689 Other abnormalities of breathing: Secondary | ICD-10-CM

## 2018-10-04 ENCOUNTER — Telehealth: Payer: Self-pay | Admitting: Family Medicine

## 2018-10-04 NOTE — Telephone Encounter (Signed)
Pt called stating that he is having some anxiety and would like to know if we can call something in for him?    (Also was inquiring about referral - it is in and awaiting apt from [place]. )

## 2018-10-05 ENCOUNTER — Other Ambulatory Visit: Payer: Self-pay | Admitting: Family Medicine

## 2018-10-05 MED ORDER — CLONAZEPAM 0.5 MG PO TABS: 0.5000 mg | ORAL_TABLET | Freq: Two times a day (BID) | ORAL | 1 refills | Status: DC | PRN

## 2018-10-05 NOTE — Telephone Encounter (Signed)
I sent klonopin to [facility]

## 2018-10-05 NOTE — Telephone Encounter (Signed)
Pt aware via vm 

## 2018-10-24 ENCOUNTER — Other Ambulatory Visit: Payer: Self-pay

## 2018-10-24 ENCOUNTER — Ambulatory Visit: Payer: PPO | Admitting: Internal Medicine

## 2018-10-24 ENCOUNTER — Encounter: Payer: Self-pay | Admitting: Internal Medicine

## 2018-10-24 VITALS — BP 128/70 | HR 72 | Temp 98.0°F | Ht 70.0 in | Wt 209.8 lb

## 2018-10-24 DIAGNOSIS — J4541 Moderate persistent asthma with (acute) exacerbation: Secondary | ICD-10-CM

## 2018-10-24 DIAGNOSIS — R0600 Dyspnea, unspecified: Secondary | ICD-10-CM | POA: Diagnosis not present

## 2018-10-24 DIAGNOSIS — K219 Gastro-esophageal reflux disease without esophagitis: Secondary | ICD-10-CM | POA: Insufficient documentation

## 2018-10-24 DIAGNOSIS — H9202 Otalgia, left ear: Secondary | ICD-10-CM | POA: Diagnosis not present

## 2018-10-24 DIAGNOSIS — R490 Dysphonia: Secondary | ICD-10-CM | POA: Diagnosis not present

## 2018-10-24 DIAGNOSIS — H6123 Impacted cerumen, bilateral: Secondary | ICD-10-CM | POA: Diagnosis not present

## 2018-10-24 LAB — D-DIMER, QUANTITATIVE: D-Dimer, Quant: 0.19 mcg/mL FEU (ref <0.50)

## 2018-10-24 MED ORDER — BEVESPI AEROSPHERE 9-4.8 MCG/ACT IN AERO: 2.0000 | INHALATION_SPRAY | Freq: Two times a day (BID) | RESPIRATORY_TRACT | 0 refills | Status: DC

## 2018-10-24 NOTE — Patient Instructions (Signed)
Order- GI referral to Dr Paulita Fujita    GERD with hoarseness-  Previous patinet  Order- lab- D dimer     Dx dyspnea  Order- schedule PFT   Dx Dyspnea  Sample Bevespi inhaler    Inhale 2 puffs, twice daily  Please call as needed

## 2018-10-24 NOTE — Progress Notes (Signed)
10/24/2018- 79 yoM former smoker for consultation at request of Dr Dennard Schaumann.  Referred by Dr. Dennard Schaumann for SOB. Patient reports SOB with and without exertion. Medical problem list includes Asthma, HBP, Hypercholesterolemia, PAFib, Lung nodule(2012), Cervical spondylosis,   Patient reports that SOB started in December 2019. He states that he has a dry cough, but every morning he coughes up grey to yellow sputum.   Was walking 2 miles in 30 minutes, then after sinus infection in Dec has had cough and dyspnea. Feels he can't get a deep breath, but pulls full range on his incentive spirometer. Wakes each AM coughing a lump of sticky phlegm. Cough mostly dry but has seen a streak of heme once or twice.  Symbicort, albuterol hfa not helping Had seen Dr Wert 2011 w/ c/o cough, throat irritation/ hoarseness, gray mucus "upper airway cough", which resolved. Hx severe asthma as child. Treated by PCP in the winter with abx, pred taper. Omeprazole changed to pantoprazole. ENT Dr Radene Journey did rhinoscopy and told him no significant abnormality seen. Pt feels tight across throat. 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 Father dies with esophageal cancer and patient wishes reassurance. Has seen Dr Paulita Fujita GI in past for GERD and wishes referral.  Denies hx clotting, leg pain or swelling. Labs- nl CMET, BNP and Hgb 08/28/2018 Echo 08/28/2018- EF 55-60%, no wall motion abnl or PHN. CT chest 09/24/2018-  IMPRESSION: No acute cardiopulmonary disease. Biapical scarring. Stable right upper lobe nodule since 2012 compatible with benign nodule. Coronary artery disease. Aortic Atherosclerosis (ICD10-I70.0).  Prior to Admission medications   Medication Sig Start Date End Date Taking? Authorizing Provider  albuterol (PROAIR HFA) 108 (90 BASE) MCG/ACT inhaler Inhale 2 puffs into the lungs every 4 (four) hours as needed. 10/31/12  Yes [name], MD  atorvastatin (LIPITOR) 40 MG tablet TAKE 1  TABLET(40 MG) BY MOUTH DAILY 04/03/18  Yes [name], MD  budesonide-formoterol [facility]) 160-4.5 MCG/ACT inhaler Inhale 2 puffs into the lungs 2 (two) times daily.   Yes [provider]  cetirizine (ZYRTEC) 10 MG tablet Take 10 mg by mouth daily.   Yes [provider]  clonazePAM (KLONOPIN) 0.5 MG tablet Take 1 tablet (0.5 mg total) by mouth 2 (two) times daily as needed for anxiety. 10/05/18  Yes [name], MD  fluticasone (FLONASE) 50 MCG/ACT nasal spray Place 2 sprays into both nostrils as needed for allergies. 04/19/17  Yes [provider]  metoprolol succinate (TOPROL-XL) 25 MG 24 hr tablet Take 1 tablet by mouth daily 06/14/18  Yes Pickard, Cammie Mcgee, MD  Multiple Vitamins-Minerals (ICAPS AREDS 2 PO) Take 2 capsules by mouth daily.   Yes [provider]  pantoprazole (PROTONIX) 40 MG tablet Take 1 tablet (40 mg total) by mouth 2 (two) times daily. 09/10/18  Yes [name], MD  rivaroxaban [facility]) 20 MG TABS tablet Take 1 tablet by mouth daily with supper 08/23/18  Yes Jettie Booze, MD  tamsulosin (FLOMAX) 0.4 MG CAPS capsule Take 1 capsule (0.4 mg total) by mouth daily. 04/19/17  Yes [name], MD  traMADol (ULTRAM) 50 MG tablet Take by mouth every 6 (six) hours as needed.   Yes [provider]  vitamin C (ASCORBIC ACID) 500 MG tablet Take 500 mg by mouth daily.   Yes [provider]  Glycopyrrolate-Formoterol (BEVESPI AEROSPHERE) 9-4.8 MCG/ACT AERO Inhale 2 puffs into the lungs 2 (two) times a day. 10/24/18   Deneise Lever, MD  Past Medical History:  Diagnosis Date  . Allergy   . Asthma   . Cervical spondylosis with radiculopathy    left c6 radiculopathy  . Chronic back pain   . GERD (gastroesophageal reflux disease)   . Hyperlipidemia   . Hypertension    History reviewed. No pertinent surgical history. Family History  Problem Relation Age of Onset  . Heart failure Mother   . Heart attack  Maternal Uncle    Social History   Socioeconomic History  . Marital status: Married    Spouse name: Not on file  . Number of children: Not on file  . Years of education: Not on file  . Highest education level: Not on file  Occupational History  . Not on file  Social Needs  . Financial resource strain: Not on file  . Food insecurity    Worry: Not on file    Inability: Not on file  . Transportation needs    Medical: Not on file    Non-medical: Not on file  Tobacco Use  . Smoking status: Former Smoker    Packs/day: 0.50    Years: 12.00    Pack years: 6.00    Types: Cigarettes    Quit date: 10/23/1968    Years since quitting: 50.0  . Smokeless tobacco: Never Used  Substance and Sexual Activity  . Alcohol use: No  . Drug use: No  . Sexual activity: Not on file    Comment: married, retired.  Lifestyle  . Physical activity    Days per week: Not on file    Minutes per session: Not on file  . Stress: Not on file  Relationships  . Social Herbalist on phone: Not on file    Gets together: Not on file    Attends religious service: Not on file    Active member of club or organization: Not on file    Attends meetings of clubs or organizations: Not on file    Relationship status: Not on file  . Intimate partner violence    Fear of current or ex partner: Not on file    Emotionally abused: Not on file    Physically abused: Not on file    Forced sexual activity: Not on file  Other Topics Concern  . Not on file  Social History Narrative  . Not on file   ROS-see HPI  + = positive Constitutional:    weight loss, night sweats, fevers, chills, fatigue, lassitude. HEENT:    headaches, difficulty swallowing, tooth/dental problems, sore throat,       sneezing, itching, ear ache, nasal congestion, post nasal drip, snoring CV:    chest pain, orthopnea, PND, swelling in lower extremities, anasarca,                                  dizziness, palpitations Resp:   +shortness  of breath with exertion or at rest.                +productive cough,   +non-productive cough, coughing up of blood.              change in color of mucus.  wheezing.   Skin:    rash or lesions. GI:  No-   heartburn, indigestion, abdominal pain, nausea, vomiting, diarrhea,                 change in bowel  habits, loss of appetite GU: dysuria, change in color of urine, no urgency or frequency.   flank pain. MS:   joint pain, stiffness, decreased range of motion, back pain. Neuro-     nothing unusual Psych:  change in mood or affect.  depression or anxiety.   memory loss.  OBJ- Physical Exam General- Alert, Oriented, Affect-appropriate, Distress- none acute Skin- rash-none, lesions- none, excoriation- none Lymphadenopathy- none Head- atraumatic            Eyes- Gross vision intact, PERRLA, conjunctivae and secretions clear            Ears- Hearing, canals-normal            Nose- Clear, no-Septal dev, mucus, polyps, erosion, perforation             Throat- Mallampati II , mucosa clear , drainage- none, tonsils- atrophic,  + hoarse Neck- flexible , trachea midline, no stridor , thyroid nl, carotid no bruit Chest - symmetrical excursion , unlabored           Heart/CV- RRR , no murmur , no gallop  , no rub, nl s1 s2                           - JVD- none , edema- none, stasis changes- none, varices- none           Lung- clear to P&A/ no crackles, wheeze- none, cough- none , dullness-none, rub- none           Chest wall-  Abd-  Br/ Gen/ Rectal- Not done, not indicated Extrem- cyanosis- none, clubbing, none, atrophy- none, strength- nl Neuro- grossly intact to observation

## 2018-10-24 NOTE — Assessment & Plan Note (Signed)
He wants reassurance that his throat tightness, cough and discomfort aren't somehow related to esophageal cancer which was fatal for father.  Plan- referral to GI Dr Paulita Fujita has seen him before.

## 2018-10-24 NOTE — Assessment & Plan Note (Signed)
Symptoms are very similar to asthma pattern in 2011.  BNP and Echo don't suggest cardiac issue. CT w/o contrast indicated no obvious problem. Part of the pattern could be from PE, but he denies other VTE clues. We will check Ddimer and consider repeat CTa w contrast if necessary. Now hoarseness likely from coughing- ENT scope was neg.  Plan- PFT, trial of Breo 200

## 2018-10-31 DIAGNOSIS — H35722 Serous detachment of retinal pigment epithelium, left eye: Secondary | ICD-10-CM | POA: Diagnosis not present

## 2018-10-31 DIAGNOSIS — H35363 Drusen (degenerative) of macula, bilateral: Secondary | ICD-10-CM | POA: Diagnosis not present

## 2018-10-31 DIAGNOSIS — H43813 Vitreous degeneration, bilateral: Secondary | ICD-10-CM | POA: Diagnosis not present

## 2018-10-31 DIAGNOSIS — H353211 Exudative age-related macular degeneration, right eye, with active choroidal neovascularization: Secondary | ICD-10-CM | POA: Diagnosis not present

## 2018-10-31 DIAGNOSIS — Z961 Presence of intraocular lens: Secondary | ICD-10-CM | POA: Diagnosis not present

## 2018-10-31 DIAGNOSIS — H353221 Exudative age-related macular degeneration, left eye, with active choroidal neovascularization: Secondary | ICD-10-CM | POA: Diagnosis not present

## 2018-10-31 DIAGNOSIS — H35453 Secondary pigmentary degeneration, bilateral: Secondary | ICD-10-CM | POA: Diagnosis not present

## 2018-11-05 ENCOUNTER — Telehealth: Payer: Self-pay | Admitting: Internal Medicine

## 2018-11-05 MED ORDER — BEVESPI AEROSPHERE 9-4.8 MCG/ACT IN AERO: 2.0000 | INHALATION_SPRAY | Freq: Two times a day (BID) | RESPIRATORY_TRACT | 12 refills | Status: DC

## 2018-11-05 MED ORDER — ANORO ELLIPTA 62.5-25 MCG/INH IN AEPB: 1.0000 | INHALATION_SPRAY | Freq: Every day | RESPIRATORY_TRACT | 12 refills | Status: DC

## 2018-11-05 NOTE — Telephone Encounter (Signed)
Let's let him try an Anoro sample     Inhale 1 puff, once daily. The medication works similar to [employer].  If he likes it ok, we can send Rx for Anoro, # 60, inhale 1 puff once daily, ref x 12. He would use this instead of Symbicort.

## 2018-11-05 NOTE — Telephone Encounter (Signed)
ATC pt to inform him a prescription for Bevespi will be sent to his preferred pharmacy. Per DPR authorization, I have left a detailed message regarding this on patient's home phone answering machine. I let pt know to call our office back if he has any additional questions or concerns.  Order for [facility] prescription has been sent. Nothing further needed at this time.

## 2018-11-05 NOTE — Telephone Encounter (Signed)
Called & spoke w/ pt. Pt last seen 10/24/2018 and given a Bevespi sample inhaler that lasted for 7 days. Pt states Bevespi is therapeutic for him. He notes that he is still wheezing at night and coughing up phlegm, however, he is not having the flare-ups he used to have before using Bevespi. I let pt know I would touch bases w/ CY to see if we can get him a prescription for Bevespi and get back with him. Pt expressed understanding.   CY, please advise if you would authorized getting this pt a prescription for Bevespi. His preferred pharmacy is [facility] off [address]. Thank you.

## 2018-11-05 NOTE — Telephone Encounter (Signed)
Ok Rx [employer] inhaler, # 1,  Inhale 2 puffs, twice daily, ref x 12

## 2018-11-05 NOTE — Telephone Encounter (Signed)
Spoke with Tyler Allison and he stated bevespi was not formulary and he does not know what is.  Tyler Allison also wanted to clarify if he should stop taking the symbicort while on bevespi?  Dr Annamaria Boots could you please recommend a different inhaler?  Current Outpatient Medications on File Prior to Visit  Medication Sig Dispense Refill  . albuterol (PROAIR HFA) 108 (90 BASE) MCG/ACT inhaler Inhale 2 puffs into the lungs every 4 (four) hours as needed. 8.5 g 3  . atorvastatin (LIPITOR) 40 MG tablet TAKE 1 TABLET(40 MG) BY MOUTH DAILY 90 tablet 2  . budesonide-formoterol (SYMBICORT) 160-4.5 MCG/ACT inhaler Inhale 2 puffs into the lungs 2 (two) times daily.    . cetirizine (ZYRTEC) 10 MG tablet Take 10 mg by mouth daily.    . clonazePAM (KLONOPIN) 0.5 MG tablet Take 1 tablet (0.5 mg total) by mouth 2 (two) times daily as needed for anxiety. 60 tablet 1  . fluticasone (FLONASE) 50 MCG/ACT nasal spray Place 2 sprays into both nostrils as needed for allergies.  4  . Glycopyrrolate-Formoterol (BEVESPI AEROSPHERE) 9-4.8 MCG/ACT AERO Inhale 2 puffs into the lungs 2 (two) times a day. 5.9 g 12  . metoprolol succinate (TOPROL-XL) 25 MG 24 hr tablet Take 1 tablet by mouth daily 90 tablet 2  . Multiple Vitamins-Minerals (ICAPS AREDS 2 PO) Take 2 capsules by mouth daily.    . pantoprazole (PROTONIX) 40 MG tablet Take 1 tablet (40 mg total) by mouth 2 (two) times daily. 60 tablet 3  . rivaroxaban (XARELTO) 20 MG TABS tablet Take 1 tablet by mouth daily with supper 90 tablet 0  . tamsulosin (FLOMAX) 0.4 MG CAPS capsule Take 1 capsule (0.4 mg total) by mouth daily. 90 capsule 3  . traMADol (ULTRAM) 50 MG tablet Take by mouth every 6 (six) hours as needed.    . vitamin C (ASCORBIC ACID) 500 MG tablet Take 500 mg by mouth daily.     No current facility-administered medications on file prior to visit.

## 2018-11-05 NOTE — Telephone Encounter (Signed)
Called and spoke with pt letting him know that CY said he could try anoro to see if this med is covered by insurance instead of the [place] as the Anoro is similar to the [place]. Pt verbalized understanding. Stated to pt to not take Symbicort while using Anoro and he verbalized understanding. Verified pt's preferred pharmacy and sent Rx in for pt. Nothing further needed.

## 2018-11-08 DIAGNOSIS — Z1211 Encounter for screening for malignant neoplasm of colon: Secondary | ICD-10-CM | POA: Diagnosis not present

## 2018-11-08 DIAGNOSIS — R1310 Dysphagia, unspecified: Secondary | ICD-10-CM | POA: Diagnosis not present

## 2018-11-14 ENCOUNTER — Other Ambulatory Visit: Payer: Self-pay | Admitting: Gastroenterology

## 2018-11-14 DIAGNOSIS — R1310 Dysphagia, unspecified: Secondary | ICD-10-CM

## 2018-11-19 ENCOUNTER — Ambulatory Visit
Admission: RE | Admit: 2018-11-19 | Discharge: 2018-11-19 | Disposition: A | Discharge status: Home / Self Care | Payer: PPO | Source: Ambulatory Visit | Attending: Gastroenterology | Admitting: Gastroenterology

## 2018-11-19 DIAGNOSIS — R1310 Dysphagia, unspecified: Secondary | ICD-10-CM | POA: Diagnosis not present

## 2018-11-22 DIAGNOSIS — H353221 Exudative age-related macular degeneration, left eye, with active choroidal neovascularization: Secondary | ICD-10-CM | POA: Diagnosis not present

## 2018-11-22 DIAGNOSIS — H35363 Drusen (degenerative) of macula, bilateral: Secondary | ICD-10-CM | POA: Diagnosis not present

## 2018-11-22 DIAGNOSIS — H353211 Exudative age-related macular degeneration, right eye, with active choroidal neovascularization: Secondary | ICD-10-CM | POA: Diagnosis not present

## 2018-11-22 DIAGNOSIS — H35722 Serous detachment of retinal pigment epithelium, left eye: Secondary | ICD-10-CM | POA: Diagnosis not present

## 2018-12-06 ENCOUNTER — Other Ambulatory Visit: Payer: Self-pay | Admitting: Family Medicine

## 2018-12-11 DIAGNOSIS — H353211 Exudative age-related macular degeneration, right eye, with active choroidal neovascularization: Secondary | ICD-10-CM | POA: Diagnosis not present

## 2018-12-19 DIAGNOSIS — R972 Elevated prostate specific antigen [PSA]: Secondary | ICD-10-CM | POA: Diagnosis not present

## 2018-12-26 DIAGNOSIS — R972 Elevated prostate specific antigen [PSA]: Secondary | ICD-10-CM | POA: Diagnosis not present

## 2018-12-26 DIAGNOSIS — N401 Enlarged prostate with lower urinary tract symptoms: Secondary | ICD-10-CM | POA: Diagnosis not present

## 2018-12-26 DIAGNOSIS — R351 Nocturia: Secondary | ICD-10-CM | POA: Diagnosis not present

## 2018-12-28 ENCOUNTER — Encounter: Payer: Self-pay | Admitting: Internal Medicine

## 2018-12-28 ENCOUNTER — Ambulatory Visit: Payer: PPO | Admitting: Internal Medicine

## 2018-12-28 ENCOUNTER — Telehealth: Payer: Self-pay | Admitting: Internal Medicine

## 2018-12-28 ENCOUNTER — Other Ambulatory Visit: Payer: Self-pay

## 2018-12-28 VITALS — BP 112/70 | HR 67 | Temp 98.3°F | Ht 70.0 in | Wt 213.8 lb

## 2018-12-28 DIAGNOSIS — J4540 Moderate persistent asthma, uncomplicated: Secondary | ICD-10-CM

## 2018-12-28 DIAGNOSIS — Z23 Encounter for immunization: Secondary | ICD-10-CM | POA: Diagnosis not present

## 2018-12-28 DIAGNOSIS — J42 Unspecified chronic bronchitis: Secondary | ICD-10-CM

## 2018-12-28 DIAGNOSIS — K219 Gastro-esophageal reflux disease without esophagitis: Secondary | ICD-10-CM

## 2018-12-28 DIAGNOSIS — R0602 Shortness of breath: Secondary | ICD-10-CM

## 2018-12-28 LAB — CBC WITH DIFFERENTIAL/PLATELET
Basophils Absolute: 0.1 10*3/uL (ref 0.0–0.1)
Basophils Relative: 1 % (ref 0.0–3.0)
Eosinophils Absolute: 0.4 10*3/uL (ref 0.0–0.7)
Eosinophils Relative: 5.7 % — ABNORMAL HIGH (ref 0.0–5.0)
HCT: 42 % (ref 39.0–52.0)
Hemoglobin: 14.3 g/dL (ref 13.0–17.0)
Lymphocytes Relative: 22.3 % (ref 12.0–46.0)
Lymphs Abs: 1.6 10*3/uL (ref 0.7–4.0)
MCHC: 34.1 g/dL (ref 30.0–36.0)
MCV: 92 fl (ref 78.0–100.0)
Monocytes Absolute: 1.2 10*3/uL — ABNORMAL HIGH (ref 0.1–1.0)
Monocytes Relative: 16.9 % — ABNORMAL HIGH (ref 3.0–12.0)
Neutro Abs: 3.9 10*3/uL (ref 1.4–7.7)
Neutrophils Relative %: 54.1 % (ref 43.0–77.0)
Platelets: 245 10*3/uL (ref 150.0–400.0)
RBC: 4.56 Mil/uL (ref 4.22–5.81)
RDW: 12.9 % (ref 11.5–15.5)
WBC: 7.2 10*3/uL (ref 4.0–10.5)

## 2018-12-28 MED ORDER — PREDNISONE 10 MG PO TABS: ORAL_TABLET | ORAL | 0 refills | Status: DC

## 2018-12-28 NOTE — Telephone Encounter (Signed)
Please schedule a PFT for pt at the next available. Order is placed from 10/24/2018. Pt's next appt is 04/03/2019 w/ CY. Thank you.

## 2018-12-28 NOTE — Assessment & Plan Note (Signed)
This may be a bronchiolitis/ chronic bronchitis, subsequent to bad sinusitis he had last winter which responded to prednisone and abx then. He wants to try prednisone again. Plan- CBC w diff, IgE, PFT as planned, Prednisone taper

## 2018-12-28 NOTE — Patient Instructions (Signed)
Order- PFT as scheduled, when available  Script sent for prednisone  Taper  When you finish the Anoro sample, go back to Symbicort for comparison  Order- labs- CBC w diff, total IgE    Dx Bronchitis, chronic  Please call as needed

## 2018-12-28 NOTE — Progress Notes (Signed)
HPI oM former smoker followed for dyspnea, cough  Asthma, complicated by HBP, Hypercholesterolemia, PAFib, Lung nodule(2012), Cervical spondylosis, Dysphagia  ------------------------------------------------------------------------ 10/24/2018- 77 yoM former smoker for consultation at request of Dr Dennard Schaumann.  Referred by Dr. Dennard Schaumann for SOB. Patient reports SOB with and without exertion. Medical problem list includes Asthma, HBP, Hypercholesterolemia, PAFib, Lung nodule(2012), Cervical spondylosis,   Patient reports that SOB started in December 2019. He states that he has a dry cough, but every morning he coughes up grey to yellow sputum.   Was walking 2 miles in 30 minutes, then after sinus infection in Dec has had cough and dyspnea. Feels he can't get a deep breath, but pulls full range on his incentive spirometer. Wakes each AM coughing a lump of sticky phlegm. Cough mostly dry but has seen a streak of heme once or twice.  Symbicort, albuterol hfa not helping Had seen Dr Wert 2011 w/ c/o cough, throat irritation/ hoarseness, gray mucus "upper airway cough", which resolved. Hx severe asthma as child. Treated by PCP in the winter with abx, pred taper. Omeprazole changed to pantoprazole. ENT Dr Radene Journey did rhinoscopy and told him no significant abnormality seen. Pt feels tight across throat. 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 Father dies with esophageal cancer and patient wishes reassurance. Has seen Dr Paulita Fujita [facility] in past for GERD and wishes referral.  Denies hx clotting, leg pain or swelling. Labs- nl CMET, BNP and Hgb 08/28/2018 Echo 08/28/2018- EF 55-60%, no wall motion abnl or PHN. CT chest 09/24/2018-  IMPRESSION: No acute cardiopulmonary disease. Biapical scarring. Stable right upper lobe nodule since 2012 compatible with benign nodule. Coronary artery disease. Aortic Atherosclerosis (ICD10-I70.0).  12/28/2018-  77 yoM former smoker followed for dyspnea cough  Asthma, complicated by HBP, Hypercholesterolemia, PAFib, Lung nodule(2012), Cervical spondylosis, Dysphagia -----pt states his breathing is unchanged; reports new inhaler (Anoro) is no different than Symbicort, reports he is still wheezing Proair, Flonase, Symbicort 160/ Anoro sample, Zyrtec, ProAir doesn't help as he remembers from previous asthma.  GI/ Dr Paulita Fujita barium swallow- some narrowing but nothing needed to be done. ENT/ Dr Lucia Gaskins rhinoscopy had seen "some congestion" . PFT still pending, delayed by Covid.  Throat clearing in AM> "lump of phlegm".  Still notes more DOE than he remembers from last year.  ROS-see HPI  + = positive Constitutional:    weight loss, night sweats, fevers, chills, fatigue, lassitude. HEENT:    headaches, difficulty swallowing, tooth/dental problems, sore throat,       sneezing, itching, ear ache, nasal congestion, post nasal drip, snoring CV:    chest pain, orthopnea, PND, swelling in lower extremities, anasarca,                                  dizziness, palpitations Resp:   +shortness of breath with exertion or at rest.                +productive cough,   +non-productive cough, coughing up of blood.              change in color of mucus.  wheezing.   Skin:    rash or lesions. GI:  No-   heartburn, indigestion, abdominal pain, nausea, vomiting, diarrhea,                 change in bowel habits, loss of appetite GU: dysuria, change in color of urine, no urgency or frequency.  flank pain. MS:   joint pain, stiffness, decreased range of motion, back pain. Neuro-     nothing unusual Psych:  change in mood or affect.  depression or anxiety.   memory loss.  OBJ- Physical Exam General- Alert, Oriented, Affect-appropriate, Distress- none acute Skin- rash-none, lesions- none, excoriation- none Lymphadenopathy- none Head- atraumatic            Eyes- Gross vision intact, PERRLA, conjunctivae and secretions clear            Ears- Hearing, canals-normal             Nose- Clear, no-Septal dev, mucus, polyps, erosion, perforation             Throat- Mallampati II , mucosa clear , drainage- none, tonsils- atrophic,  + hoarse  Neck- flexible , trachea midline, no stridor , thyroid nl, carotid no bruit Chest - symmetrical excursion , unlabored           Heart/CV- RRR faint , no murmur , no gallop  , no rub, nl s1 s2                           - JVD- none , edema- none, stasis changes- none, varices- none           Lung- +"sticky" sounding crackles in bases on deep inspiration, wheeze- none, cough- none , dullness-none, rub- none           Chest wall-  Abd-  Br/ Gen/ Rectal- Not done, not indicated Extrem- cyanosis- none, clubbing, none, atrophy- none, strength- nl Neuro- grossly intact to observation

## 2018-12-28 NOTE — Assessment & Plan Note (Signed)
Emphasized continuation or reflux precautions

## 2018-12-31 LAB — IGE: IgE (Immunoglobulin E), Serum: 181 kU/L — ABNORMAL HIGH (ref <=114)

## 2018-12-31 NOTE — Telephone Encounter (Signed)
CY please advise on lab results.

## 2019-01-01 DIAGNOSIS — H353221 Exudative age-related macular degeneration, left eye, with active choroidal neovascularization: Secondary | ICD-10-CM | POA: Diagnosis not present

## 2019-01-04 ENCOUNTER — Other Ambulatory Visit: Payer: Self-pay | Admitting: Internal Medicine

## 2019-01-08 NOTE — Telephone Encounter (Signed)
Allergy markers- IgE class of antibodies, and eosinophils are elevated. This may indicate an allergy process is contributing to cough and wheeze. We may want to discuss a kind of asthma therapy we call "Biologics", depending on how Fall season affects his symptoms.   Another type of blood cell- monocytes- is also elevated. I am not sure what is causing that, so we will want to recheck it in a couple of weeks.

## 2019-01-08 NOTE — Telephone Encounter (Signed)
Message routed to Dr. Annamaria Boots.  Dr. Annamaria Boots, I called the patient and advised him of your response. He asked if the repeat lab work can be done the same day of the PFT on 01/15/19?

## 2019-01-09 NOTE — Telephone Encounter (Signed)
Yes, please order CBC w diff to be done when he returns.

## 2019-01-10 NOTE — Telephone Encounter (Signed)
Order for lab placed. Call made to patient, made aware of CY recommendations. Voiced understanding. Will get labs done when he comes in for PFT. Nothing further is needed at this time.

## 2019-01-11 ENCOUNTER — Other Ambulatory Visit (HOSPITAL_COMMUNITY)
Admission: RE | Admit: 2019-01-11 | Discharge: 2019-01-11 | Disposition: A | Discharge status: Home / Self Care | Payer: PPO | Source: Ambulatory Visit | Attending: Internal Medicine | Admitting: Internal Medicine

## 2019-01-11 DIAGNOSIS — Z20828 Contact with and (suspected) exposure to other viral communicable diseases: Secondary | ICD-10-CM | POA: Diagnosis not present

## 2019-01-11 DIAGNOSIS — Z01812 Encounter for preprocedural laboratory examination: Secondary | ICD-10-CM | POA: Insufficient documentation

## 2019-01-12 LAB — NOVEL CORONAVIRUS, NAA (HOSP ORDER, SEND-OUT TO REF LAB; TAT 18-24 HRS): SARS-CoV-2, NAA: NOT DETECTED

## 2019-01-15 ENCOUNTER — Other Ambulatory Visit: Payer: Self-pay

## 2019-01-15 ENCOUNTER — Telehealth: Payer: Self-pay | Admitting: *Deleted

## 2019-01-15 ENCOUNTER — Ambulatory Visit (INDEPENDENT_AMBULATORY_CARE_PROVIDER_SITE_OTHER): Payer: PPO | Admitting: Internal Medicine

## 2019-01-15 ENCOUNTER — Other Ambulatory Visit (INDEPENDENT_AMBULATORY_CARE_PROVIDER_SITE_OTHER): Payer: PPO

## 2019-01-15 DIAGNOSIS — R0602 Shortness of breath: Secondary | ICD-10-CM

## 2019-01-15 DIAGNOSIS — R0600 Dyspnea, unspecified: Secondary | ICD-10-CM

## 2019-01-15 LAB — PULMONARY FUNCTION TEST
DL/VA % pred: 111 %
DL/VA: 4.33 ml/min/mmHg/L
DLCO cor % pred: 97 %
DLCO cor: 23.85 ml/min/mmHg
DLCO unc % pred: 96 %
DLCO unc: 23.65 ml/min/mmHg
FEF 25-75 Post: 1.89 L/sec
FEF 25-75 Pre: 1.55 L/sec
FEF2575-%Change-Post: 22 %
FEF2575-%Pred-Post: 93 %
FEF2575-%Pred-Pre: 76 %
FEV1-%Change-Post: 4 %
FEV1-%Pred-Post: 88 %
FEV1-%Pred-Pre: 84 %
FEV1-Post: 2.56 L
FEV1-Pre: 2.45 L
FEV1FVC-%Change-Post: 3 %
FEV1FVC-%Pred-Pre: 100 %
FEV6-%Change-Post: 1 %
FEV6-%Pred-Post: 89 %
FEV6-%Pred-Pre: 88 %
FEV6-Post: 3.4 L
FEV6-Pre: 3.36 L
FEV6FVC-%Change-Post: 0 %
FEV6FVC-%Pred-Post: 106 %
FEV6FVC-%Pred-Pre: 106 %
FVC-%Change-Post: 1 %
FVC-%Pred-Post: 84 %
FVC-%Pred-Pre: 83 %
FVC-Post: 3.43 L
FVC-Pre: 3.39 L
Post FEV1/FVC ratio: 75 %
Post FEV6/FVC ratio: 100 %
Pre FEV1/FVC ratio: 72 %
Pre FEV6/FVC Ratio: 99 %
RV % pred: 93 %
RV: 2.47 L
TLC % pred: 87 %
TLC: 6.2 L

## 2019-01-15 NOTE — Telephone Encounter (Signed)
Suggest instead of prednisone taper, instead try prednisone 10 mg daily x 2 weeks:  Order prednisone 10 mg, # 15, 1 each morning

## 2019-01-15 NOTE — Progress Notes (Signed)
Full PFT performed today. °

## 2019-01-15 NOTE — Telephone Encounter (Signed)
Pt had PFT performed today. He is wanting to know if you would be willing to give another round of prednisone. Pt c/o constant throat clearing and he is coughing up lots of mucus daily. He says the last round of prednisone did help this. A few days after completing prednisone mucus/coughing came back. Please advise.        12/28/2018-  77 yoM former smoker followed for dyspnea cough  Asthma, complicated by HBP, Hypercholesterolemia, PAFib, Lung nodule(2012), Cervical spondylosis, Dysphagia -----pt states his breathing is unchanged; reports new inhaler (Anoro) is no different than Symbicort, reports he is still wheezing Proair, Flonase, Symbicort 160/ Anoro sample, Zyrtec, ProAir doesn't help as he remembers from previous asthma.  GI/ Dr Paulita Fujita barium swallow- some narrowing but nothing needed to be done. ENT/ Dr Lucia Gaskins rhinoscopy had seen "some congestion" . PFT still pending, delayed by Covid.  Throat clearing in AM> "lump of phlegm".  Still notes more DOE than he remembers from last year.

## 2019-01-16 DIAGNOSIS — H353211 Exudative age-related macular degeneration, right eye, with active choroidal neovascularization: Secondary | ICD-10-CM | POA: Diagnosis not present

## 2019-01-16 LAB — CBC WITH DIFFERENTIAL/PLATELET
Basophils Absolute: 0.1 10*3/uL (ref 0.0–0.1)
Basophils Relative: 1.2 % (ref 0.0–3.0)
Eosinophils Absolute: 0.3 10*3/uL (ref 0.0–0.7)
Eosinophils Relative: 2.9 % (ref 0.0–5.0)
HCT: 43.1 % (ref 39.0–52.0)
Hemoglobin: 14.5 g/dL (ref 13.0–17.0)
Lymphocytes Relative: 22.8 % (ref 12.0–46.0)
Lymphs Abs: 2.1 10*3/uL (ref 0.7–4.0)
MCHC: 33.6 g/dL (ref 30.0–36.0)
MCV: 92.5 fl (ref 78.0–100.0)
Monocytes Absolute: 1.2 10*3/uL — ABNORMAL HIGH (ref 0.1–1.0)
Monocytes Relative: 13 % — ABNORMAL HIGH (ref 3.0–12.0)
Neutro Abs: 5.5 10*3/uL (ref 1.4–7.7)
Neutrophils Relative %: 60.1 % (ref 43.0–77.0)
Platelets: 232 10*3/uL (ref 150.0–400.0)
RBC: 4.66 Mil/uL (ref 4.22–5.81)
RDW: 13.1 % (ref 11.5–15.5)
WBC: 9.1 10*3/uL (ref 4.0–10.5)

## 2019-01-16 MED ORDER — PREDNISONE 10 MG PO TABS: 10.0000 mg | ORAL_TABLET | Freq: Every day | ORAL | 0 refills | Status: AC

## 2019-01-16 NOTE — Telephone Encounter (Signed)
Call made to patient, made aware of CY recommendations. Patient okay with this. Confirmed pharmacy. Order sent to pharmacy. Voiced understanding. Nothing further needed at this time.

## 2019-01-25 ENCOUNTER — Telehealth: Payer: Self-pay | Admitting: Internal Medicine

## 2019-01-25 NOTE — Telephone Encounter (Signed)
Patient would like results left on his voicemail [phone] when his PFT is resulted please

## 2019-01-25 NOTE — Telephone Encounter (Signed)
I do not see any results for this PFT patient had on 01/15/19 .   CY please advise on results  Allergies  Allergen Reactions  . Codeine Nausea And Vomiting   Current Outpatient Medications on File Prior to Visit  Medication Sig Dispense Refill  . albuterol (PROAIR HFA) 108 (90 BASE) MCG/ACT inhaler Inhale 2 puffs into the lungs every 4 (four) hours as needed. 8.5 g 3  . atorvastatin (LIPITOR) 40 MG tablet TAKE 1 TABLET(40 MG) BY MOUTH DAILY 90 tablet 2  . budesonide-formoterol (SYMBICORT) 160-4.5 MCG/ACT inhaler Inhale 2 puffs into the lungs 2 (two) times daily.    . cetirizine (ZYRTEC) 10 MG tablet Take 10 mg by mouth daily.    . clonazePAM (KLONOPIN) 0.5 MG tablet Take 1 tablet (0.5 mg total) by mouth 2 (two) times daily as needed for anxiety. 60 tablet 1  . fluticasone (FLONASE) 50 MCG/ACT nasal spray Place 2 sprays into both nostrils as needed for allergies.  4  . metoprolol succinate (TOPROL-XL) 25 MG 24 hr tablet Take 1 tablet by mouth daily 90 tablet 2  . Multiple Vitamins-Minerals (ICAPS AREDS 2 PO) Take 2 capsules by mouth daily.    . pantoprazole (PROTONIX) 40 MG tablet TAKE 1 TABLET(40 MG) BY MOUTH TWICE DAILY 180 tablet 3  . predniSONE (DELTASONE) 10 MG tablet 4 X 2 DAYS, 3 X 2 DAYS, 2 X 2 DAYS, 1 X 2 DAYS 20 tablet 0  . predniSONE (DELTASONE) 10 MG tablet Take 1 tablet (10 mg total) by mouth daily with breakfast for 14 days. 14 tablet 0  . rivaroxaban (XARELTO) 20 MG TABS tablet Take 1 tablet by mouth daily with supper 90 tablet 0  . tamsulosin (FLOMAX) 0.4 MG CAPS capsule Take 1 capsule (0.4 mg total) by mouth daily. 90 capsule 3  . traMADol (ULTRAM) 50 MG tablet Take by mouth every 6 (six) hours as needed.    . umeclidinium-vilanterol (ANORO ELLIPTA) 62.5-25 MCG/INH AEPB Inhale 1 puff into the lungs daily. 60 each 12  . vitamin C (ASCORBIC ACID) 500 MG tablet Take 500 mg by mouth daily.     No current facility-administered medications on file prior to visit.

## 2019-01-25 NOTE — Telephone Encounter (Signed)
ATC patient, unable to reach , left detailed message with Dr. Bertrum Sol results per patient on voicemail. Nothing further needed at this time

## 2019-01-25 NOTE — Telephone Encounter (Signed)
PFT showed minimal airway obstruction with normal gas exchange and normal lung volumes.  We will be able to go over this at next office appointment, but no major intervention is needed now.

## 2019-01-29 ENCOUNTER — Other Ambulatory Visit: Payer: Self-pay | Admitting: Family Medicine

## 2019-01-31 ENCOUNTER — Telehealth: Payer: Self-pay | Admitting: Internal Medicine

## 2019-01-31 DIAGNOSIS — R0602 Shortness of breath: Secondary | ICD-10-CM

## 2019-01-31 NOTE — Telephone Encounter (Signed)
FYI:  Called patient back and answered questions re: labs/PFT which were overall normal. He is still bothered with SOB that wakes him up at night.  He does have 11/20 appt with CY. Offered appt with a NP he declines and will f/u with his pcp for now.

## 2019-02-01 NOTE — Telephone Encounter (Signed)
Suggest we ordeer overnight oximetry on room air for his complain of shortness of breath at night.

## 2019-02-01 NOTE — Telephone Encounter (Signed)
Called and spoke w/ pt regarding CY's recommendations. Pt verbalized understanding and agreed to these measures. Order for ONO on RA has been placed. Nothing further needed at this time.

## 2019-02-05 DIAGNOSIS — H353221 Exudative age-related macular degeneration, left eye, with active choroidal neovascularization: Secondary | ICD-10-CM | POA: Diagnosis not present

## 2019-02-07 ENCOUNTER — Other Ambulatory Visit: Payer: Self-pay | Admitting: Pharmacist

## 2019-02-07 MED ORDER — RIVAROXABAN 20 MG PO TABS: ORAL_TABLET | ORAL | 1 refills | Status: DC

## 2019-02-07 NOTE — Progress Notes (Signed)
Age 78, weight 97kg, SCr 0.92, crcl 46ml/min, last OV April 2020, afib indication

## 2019-02-13 DIAGNOSIS — J449 Chronic obstructive pulmonary disease, unspecified: Secondary | ICD-10-CM | POA: Diagnosis not present

## 2019-02-13 DIAGNOSIS — R0902 Hypoxemia: Secondary | ICD-10-CM | POA: Diagnosis not present

## 2019-02-15 ENCOUNTER — Other Ambulatory Visit: Payer: Self-pay | Admitting: Family Medicine

## 2019-02-15 MED ORDER — CLONAZEPAM 0.5 MG PO TABS: 0.5000 mg | ORAL_TABLET | Freq: Two times a day (BID) | ORAL | 1 refills | Status: DC | PRN

## 2019-02-15 MED ORDER — RIVAROXABAN 20 MG PO TABS: ORAL_TABLET | ORAL | 1 refills | Status: DC

## 2019-02-15 NOTE — Telephone Encounter (Signed)
Requesting refill  Klonopin  LOV: 09/10/18  LRF:   10/05/18

## 2019-02-18 DIAGNOSIS — H353211 Exudative age-related macular degeneration, right eye, with active choroidal neovascularization: Secondary | ICD-10-CM | POA: Diagnosis not present

## 2019-02-25 ENCOUNTER — Telehealth: Payer: Self-pay | Admitting: Internal Medicine

## 2019-02-25 DIAGNOSIS — G4734 Idiopathic sleep related nonobstructive alveolar hypoventilation: Secondary | ICD-10-CM

## 2019-02-25 NOTE — Telephone Encounter (Signed)
Pt is requesting results of his ONO performed on 02/13/2019.   CY, please advise with your results and recommendations. Thank you.

## 2019-02-25 NOTE — Telephone Encounter (Signed)
Called and spoke w/ pt regarding these results and recommendations. Pt verbalized understanding with no additional questions. Order for new DME new O2 2L during sleep    Dx Nocturnal Hypoxemia has been placed. Nothing further needed at this time.

## 2019-02-25 NOTE — Telephone Encounter (Signed)
Overnight oximetry showed intervals when oxygen level dropped, probably during REM (dreaming) sleep. These drops added up to enough time to meet Medicare's crieria for providing home oxygen to wear while sleeping.  Order- new DME new O2 2L during sleep    Dx Nocturnal Hypoxemia

## 2019-02-26 ENCOUNTER — Telehealth: Payer: Self-pay | Admitting: Internal Medicine

## 2019-02-26 NOTE — Telephone Encounter (Signed)
[place],  [place], [phone].  LM for Levada Dy to call back.

## 2019-02-26 NOTE — Telephone Encounter (Signed)
Spoke with Levada Dy, she states that since patient has not had a face to face discussion for O2 within 30 days of ONO, they cannot order Oxygen.  ONO done on October 7 so appointment needs to be before November 7.   Dr. Annamaria Boots has openings in the next few days, will contact patient to get scheduled.   Spoke with patient, he is scheduled for appointment with CY on Friday 03/01/19 at 0900.

## 2019-02-26 NOTE — Telephone Encounter (Signed)
Levada Dy returning call.  [phone].

## 2019-03-01 ENCOUNTER — Encounter: Payer: Self-pay | Admitting: Internal Medicine

## 2019-03-01 ENCOUNTER — Other Ambulatory Visit: Payer: Self-pay

## 2019-03-01 ENCOUNTER — Ambulatory Visit (INDEPENDENT_AMBULATORY_CARE_PROVIDER_SITE_OTHER): Payer: PPO | Admitting: Internal Medicine

## 2019-03-01 VITALS — BP 126/80 | HR 66 | Temp 97.6°F | Ht 70.0 in | Wt 205.8 lb

## 2019-03-01 DIAGNOSIS — J4540 Moderate persistent asthma, uncomplicated: Secondary | ICD-10-CM

## 2019-03-01 DIAGNOSIS — G4734 Idiopathic sleep related nonobstructive alveolar hypoventilation: Secondary | ICD-10-CM | POA: Diagnosis not present

## 2019-03-01 MED ORDER — BREZTRI AEROSPHERE 160-9-4.8 MCG/ACT IN AERO: 2.0000 | INHALATION_SPRAY | Freq: Two times a day (BID) | RESPIRATORY_TRACT | 0 refills | Status: DC

## 2019-03-01 NOTE — Assessment & Plan Note (Signed)
This acts in some ways like a post-inflammatory bronchiolitis. Need to keep possibility of chronic PE syndrome, though  Plan- Sample trial [facility]

## 2019-03-01 NOTE — Progress Notes (Signed)
Patient seen in the office today and instructed on use of Breztri.  Patient expressed understanding and demonstrated technique.  

## 2019-03-01 NOTE — Patient Instructions (Signed)
Order- DME new O2 2L sleep    Dx nocturnal hypoxemia  Sample x 2 Breztri  Inhaler     Inhale 2 puffs, then rinse mouth, twice daily    See if this helps your cough, wheeze and shortness of breath.  Please call as needed

## 2019-03-01 NOTE — Progress Notes (Signed)
HPI M former smoker followed for dyspnea, cough  Asthma, complicated by HBP, Hypercholesterolemia, PAFib, Lung nodule(2012), Cervical spondylosis, Dysphagia Labs- nl CMET, BNP and Hgb 08/28/2018 Echo 08/28/2018- EF 55-60%, no wall motion abnl or PH PFT 01/15/2019- Minimal obstruction, no resp to BD, Nl lung volumes, Nl Diffusion ------------------------------------------------------------------------  12/28/2018-  Tyler Allison former smoker followed for dyspnea cough  Asthma, complicated by HBP, Hypercholesterolemia, PAFib, Lung nodule(2012), Cervical spondylosis, Dysphagia -----pt states his breathing is unchanged; reports new inhaler (Anoro) is no different than Symbicort, reports he is still wheezing Proair, Flonase, Symbicort 160/ Anoro sample, Zyrtec, ProAir doesn't help as he remembers from previous asthma.  GI/ Dr Paulita Fujita barium swallow- some narrowing but nothing needed to be done. ENT/ Dr Lucia Gaskins rhinoscopy had seen "some congestion" . PFT still pending, delayed by Covid.  Throat clearing in AM> "lump of phlegm".  Still notes more DOE than he remembers from last year.  03/01/2019-  Tyler Allison former smoker followed for dyspnea cough  Asthma, complicated by HBP, Hypercholesterolemia, PAFib, Lung nodule(2012), Cervical spondylosis, Dysphagia Proair, Flonase, Symbicort 160/ Anoro sample, Zyrtec, ------pt reports chronic wheezing and shortness of breath, coughing up light yellow-clear mucus in the morning and grayish mucus during the day No help from Symbicort or Anoro, continues ProAir hfa,  Prednisone taper helped most.  Labs- CBC wnl, Covid Neg, IgE 181 H,  Ba Swallow UGI 11/19/18-  Nonspecific esophageal motility disorder. Otherwise unremarkable Exam.   Continues Protonix 40 bid He again relates that awareness of exertional dyspnea began after prolonged sinus infection last winter, and that he now gets DOE much more easily than last year.  Describes "smothering " feeling. Still wheeze and cough  with scant clear phlegm.  PFT 01/15/2019- Minimal obstruction, no resp to BD, Nl lung volumes, Nl Diffusion ONOX on room air reported 10/19 phone to him- desaturated during REM, enough to qualify for sleep O2. Report being scanned.  // consider update CTa w contrast for chronic PE  ROS-see HPI  + = positive Constitutional:    weight loss, night sweats, fevers, chills, fatigue, lassitude. HEENT:    headaches, difficulty swallowing, tooth/dental problems, sore throat,       sneezing, itching, ear ache, nasal congestion, post nasal drip, snoring CV:    chest pain, orthopnea, PND, swelling in lower extremities, anasarca,                                  dizziness, palpitations Resp:   +shortness of breath with exertion or at rest.                +productive cough,   +non-productive cough, coughing up of blood.              change in color of mucus.  wheezing.   Skin:    rash or lesions. GI:  No-   heartburn, indigestion, abdominal pain, nausea, vomiting, diarrhea,                 change in bowel habits, loss of appetite GU: dysuria, change in color of urine, no urgency or frequency.   flank pain. MS:   joint pain, stiffness, decreased range of motion, back pain. Neuro-     nothing unusual Psych:  change in mood or affect.  depression or anxiety.   memory loss.  OBJ- Physical Exam General- Alert, Oriented, Affect-appropriate, Distress- none acute Skin- rash-none, lesions- none, excoriation- none Lymphadenopathy- none  Head- atraumatic            Eyes- Gross vision intact, PERRLA, conjunctivae and secretions clear            Ears- Hearing, canals-normal            Nose- Clear, no-Septal dev, mucus, polyps, erosion, perforation             Throat- Mallampati II , mucosa clear , drainage- none, tonsils- atrophic,  + minimally hoarse  Neck- flexible , trachea midline, no stridor , thyroid nl, carotid no bruit Chest - symmetrical excursion , unlabored           Heart/CV- RRR faint , no murmur ,  no gallop  , no rub, nl s1 s2                           - JVD- none , edema- none, stasis changes- none, varices- none           Lung- clear, wheeze- none, cough- none , dullness-none, rub- none           Chest wall-  Abd-  Br/ Gen/ Rectal- Not done, not indicated Extrem- cyanosis- none, clubbing, none, atrophy- none, strength- nl Neuro- grossly intact to observation

## 2019-03-01 NOTE — Assessment & Plan Note (Signed)
Try O2 2L during sleep Consider sleep study

## 2019-03-09 ENCOUNTER — Encounter (INDEPENDENT_AMBULATORY_CARE_PROVIDER_SITE_OTHER): Payer: Self-pay

## 2019-03-12 ENCOUNTER — Telehealth: Payer: Self-pay | Admitting: Internal Medicine

## 2019-03-12 NOTE — Telephone Encounter (Signed)
You made the right suggestion. It might help him more to try otc nasal saline gel. A little in each nostril at bedtime lasts and moisturizes a lot longer than saline spray. Thanks for helping.

## 2019-03-12 NOTE — Telephone Encounter (Signed)
Called and spoke to pt. Pt states he is wearing 2lpm at night and is waking up having dried blood in his nares. Pt states he already has humidification with his O2. Advised pt to try a nasal saline gel to also help with moisture. Pt verbalized understanding.   Dr. Annamaria Boots please advise if you have any other recommendations for pt's nasal dryness.   Allergies  Allergen Reactions  . Codeine Nausea And Vomiting   Current Outpatient Medications on File Prior to Visit  Medication Sig Dispense Refill  . albuterol (PROAIR HFA) 108 (90 BASE) MCG/ACT inhaler Inhale 2 puffs into the lungs every 4 (four) hours as needed. (Patient not taking: Reported on 03/01/2019) 8.5 g 3  . atorvastatin (LIPITOR) 40 MG tablet TAKE 1 TABLET(40 MG) BY MOUTH DAILY 90 tablet 2  . Budeson-Glycopyrrol-Formoterol (BREZTRI AEROSPHERE) 160-9-4.8 MCG/ACT AERO Inhale 2 puffs into the lungs 2 (two) times daily. 10.7 g 0  . clonazePAM (KLONOPIN) 0.5 MG tablet Take 1 tablet (0.5 mg total) by mouth 2 (two) times daily as needed for anxiety. 60 tablet 1  . fluticasone (FLONASE) 50 MCG/ACT nasal spray Place 2 sprays into both nostrils as needed for allergies.  4  . metoprolol succinate (TOPROL-XL) 25 MG 24 hr tablet Take 1 tablet by mouth daily 90 tablet 2  . pantoprazole (PROTONIX) 40 MG tablet TAKE 1 TABLET(40 MG) BY MOUTH TWICE DAILY 180 tablet 3  . rivaroxaban (XARELTO) 20 MG TABS tablet Take 1 tablet by mouth daily with supper 30 tablet 1  . tamsulosin (FLOMAX) 0.4 MG CAPS capsule Take 1 capsule (0.4 mg total) by mouth daily. 90 capsule 3  . traMADol (ULTRAM) 50 MG tablet Take by mouth every 6 (six) hours as needed.    . vitamin C (ASCORBIC ACID) 500 MG tablet Take 500 mg by mouth daily.     No current facility-administered medications on file prior to visit.

## 2019-03-12 NOTE — Telephone Encounter (Signed)
Called and left a detailed message for pt. Advised him of the recs per CY and to call back with any other questions or concerns. Will sign off.

## 2019-03-13 DIAGNOSIS — H353221 Exudative age-related macular degeneration, left eye, with active choroidal neovascularization: Secondary | ICD-10-CM | POA: Diagnosis not present

## 2019-03-19 ENCOUNTER — Encounter (INDEPENDENT_AMBULATORY_CARE_PROVIDER_SITE_OTHER): Payer: Self-pay | Admitting: Otolaryngology

## 2019-03-19 ENCOUNTER — Other Ambulatory Visit: Payer: Self-pay

## 2019-03-19 ENCOUNTER — Ambulatory Visit (INDEPENDENT_AMBULATORY_CARE_PROVIDER_SITE_OTHER): Payer: PPO | Admitting: Otolaryngology

## 2019-03-19 VITALS — Temp 97.8°F

## 2019-03-19 DIAGNOSIS — K219 Gastro-esophageal reflux disease without esophagitis: Secondary | ICD-10-CM | POA: Diagnosis not present

## 2019-03-19 DIAGNOSIS — H748X2 Other specified disorders of left middle ear and mastoid: Secondary | ICD-10-CM | POA: Diagnosis not present

## 2019-03-19 DIAGNOSIS — H6982 Other specified disorders of Eustachian tube, left ear: Secondary | ICD-10-CM

## 2019-03-19 NOTE — Progress Notes (Signed)
HPI: Tyler Allison is a 78 y.o. male who returns today for evaluation of left ear as well as breathing problems, hoarseness and history of laryngeal pharyngeal reflux.  He is presently taking pantoprazole 40 mg twice daily.  He is also had a recent sleep test with Dr. Annamaria Boots which showed that his O2 level dropped at night and is presently on nasal cannula O2 supplement at night.  He does not have obstructive sleep apnea.  He has mild hoarseness today but has not noticed any worsening of his voice. He does not smoke. He feels like his hearing is doing reasonably well on the left side.  He is using Flonase regularly and is trying to pop his ear on a regular basis..  Past Medical History:  Diagnosis Date  . Allergy   . Asthma   . Cervical spondylosis with radiculopathy    left c6 radiculopathy  . Chronic back pain   . GERD (gastroesophageal reflux disease)   . Hyperlipidemia   . Hypertension    Past Surgical History:  Procedure Laterality Date  . tympanoplasty with mastoidectomy Left 01/13/2004   Social History   Socioeconomic History  . Marital status: Married    Spouse name: Not on file  . Number of children: Not on file  . Years of education: Not on file  . Highest education level: Not on file  Occupational History  . Not on file  Social Needs  . Financial resource strain: Not on file  . Food insecurity    Worry: Not on file    Inability: Not on file  . Transportation needs    Medical: Not on file    Non-medical: Not on file  Tobacco Use  . Smoking status: Former Smoker    Packs/day: 0.50    Years: 13.00    Pack years: 6.50    Types: Cigarettes    Start date: 15    Quit date: 10/23/1968    Years since quitting: 50.4  . Smokeless tobacco: Never Used  Substance and Sexual Activity  . Alcohol use: No  . Drug use: No  . Sexual activity: Not on file    Comment: married, retired.  Lifestyle  . Physical activity    Days per week: Not on file    Minutes per session:  Not on file  . Stress: Not on file  Relationships  . Social Herbalist on phone: Not on file    Gets together: Not on file    Attends religious service: Not on file    Active member of club or organization: Not on file    Attends meetings of clubs or organizations: Not on file    Relationship status: Not on file  Other Topics Concern  . Not on file  Social History Narrative  . Not on file   Family History  Problem Relation Age of Onset  . Heart failure Mother   . Heart attack Maternal Uncle    Allergies  Allergen Reactions  . Codeine Nausea And Vomiting   Prior to Admission medications   Medication Sig Start Date End Date Taking? Authorizing Provider  albuterol (PROAIR HFA) 108 (90 BASE) MCG/ACT inhaler Inhale 2 puffs into the lungs every 4 (four) hours as needed. 10/31/12  Yes [name], MD  atorvastatin (LIPITOR) 40 MG tablet TAKE 1 TABLET(40 MG) BY MOUTH DAILY 01/29/19  Yes [name], MD  Budeson-Glycopyrrol-Formoterol (BREZTRI AEROSPHERE) 160-9-4.8 MCG/ACT AERO Inhale 2 puffs into the lungs  2 (two) times daily. 03/01/19  Yes Young, Tarri Fuller D, MD  clonazePAM (KLONOPIN) 0.5 MG tablet Take 1 tablet (0.5 mg total) by mouth 2 (two) times daily as needed for anxiety. 02/15/19  Yes [name], MD  fluticasone (FLONASE) 50 MCG/ACT nasal spray Place 2 sprays into both nostrils as needed for allergies. 04/19/17  Yes [provider]  metoprolol succinate (TOPROL-XL) 25 MG 24 hr tablet Take 1 tablet by mouth daily 06/14/18  Yes [name], MD  pantoprazole (PROTONIX) 40 MG tablet TAKE 1 TABLET(40 MG) BY MOUTH TWICE DAILY 12/07/18  Yes [name], MD  rivaroxaban (XARELTO) 20 MG TABS tablet Take 1 tablet by mouth daily with supper 02/15/19  Yes [name], MD  tamsulosin (FLOMAX) 0.4 MG CAPS capsule Take 1 capsule (0.4 mg total) by mouth daily. 04/19/17  Yes [name], MD  traMADol (ULTRAM) 50 MG tablet Take by mouth every 6  (six) hours as needed.   Yes [provider]  vitamin C (ASCORBIC ACID) 500 MG tablet Take 500 mg by mouth daily.   Yes [provider]     Positive ROS: Has history of anxiety and takes anxiety pill  All other systems have been reviewed and were otherwise negative with the exception of those mentioned in the HPI and as above.  Physical Exam: General: Alert, no acute distress Ears: Right TM is normal.  Left TM is completely atelectatic.  He is able to insufflate a small amount of air behind the TM but he has a large posterior superior retraction pocket as well as a anterior superior retraction pocket.  There is no gross cholesteatoma and no active drainage from the retraction pockets.  On tuning fork testing Weber lateralizes to the left with AC > BC on the right side and BC > AC on the left side. Nasal: Clear nasal passages.  Clear mucus discharge.  On nasal endoscopy the left eustachian tube opening is unobstructed.  He is able to insufflate a small amount of air behind the left TM. Oral exam: Clear oropharynx Fiberoptic laryngoscopy revealed clear nasopharynx, vocal cords were normal with normal vocal mobility.  No hypopharyngeal abnormality.  Mild arytenoid edema Neck: No palpable adenopathy or masses  Laryngoscopy  Date/Time: 03/19/2019 2:07 PM Performed by: Rozetta Nunnery, MD Authorized by: Rozetta Nunnery, MD   Consent:    Consent obtained:  Verbal   Consent given by:  Patient   Risks discussed:  Bleeding and pain Procedure details:    Indications: hoarseness, dysphagia, or aspiration     Medication:  Afrin   Instrument: flexible fiberoptic laryngoscope     Scope location: left nare   Sinus:    Right nasopharynx: normal     Left nasopharynx: normal     Left Eustachian tube orifices: normal   Mouth:    Vallecula: normal     Epiglottis: normal   Throat:    Pyriform sinus: normal     False vocal cords: normal     True vocal cords: normal    Comments:     Mild arytenoid edema but no hypopharyngeal or laryngeal lesions noted    Assessment: Chronic left eustachian tube dysfunction Laryngeal pharyngeal reflux disease Left TM atelectasis with conductive hearing loss  Plan: He will continue with Flonase 2 sprays each nostril at night.  He will attempt to pop his ear daily. He will continue with pantoprazole 40 mg twice daily He will follow-up here in 6 months  for recheck.   Radene Journey, MD

## 2019-03-25 DIAGNOSIS — H353211 Exudative age-related macular degeneration, right eye, with active choroidal neovascularization: Secondary | ICD-10-CM | POA: Diagnosis not present

## 2019-03-27 ENCOUNTER — Telehealth: Payer: Self-pay

## 2019-03-27 NOTE — Telephone Encounter (Signed)
Jessica from Dr. Durenda Age (DDS) office called to get clearance for pt to have 1 extraction. Janett Billow would like to know how long for pt to stop Xarelto before having the procedure. She will also fax over a clearance to be filled out.

## 2019-03-28 NOTE — Telephone Encounter (Signed)
Stop 48 hours prior to dental extraction and resume after

## 2019-03-28 NOTE — Telephone Encounter (Signed)
Pt notified Verbalizes understanding 

## 2019-04-01 ENCOUNTER — Other Ambulatory Visit: Payer: Self-pay

## 2019-04-03 ENCOUNTER — Ambulatory Visit: Payer: PPO | Admitting: Internal Medicine

## 2019-04-15 DIAGNOSIS — D0439 Carcinoma in situ of skin of other parts of face: Secondary | ICD-10-CM | POA: Diagnosis not present

## 2019-04-15 DIAGNOSIS — L814 Other melanin hyperpigmentation: Secondary | ICD-10-CM | POA: Diagnosis not present

## 2019-04-15 DIAGNOSIS — L570 Actinic keratosis: Secondary | ICD-10-CM | POA: Diagnosis not present

## 2019-04-15 DIAGNOSIS — D1801 Hemangioma of skin and subcutaneous tissue: Secondary | ICD-10-CM | POA: Diagnosis not present

## 2019-04-15 DIAGNOSIS — L821 Other seborrheic keratosis: Secondary | ICD-10-CM | POA: Diagnosis not present

## 2019-04-15 DIAGNOSIS — Z85828 Personal history of other malignant neoplasm of skin: Secondary | ICD-10-CM | POA: Diagnosis not present

## 2019-04-17 DIAGNOSIS — H353221 Exudative age-related macular degeneration, left eye, with active choroidal neovascularization: Secondary | ICD-10-CM | POA: Diagnosis not present

## 2019-04-19 DIAGNOSIS — Z85828 Personal history of other malignant neoplasm of skin: Secondary | ICD-10-CM | POA: Diagnosis not present

## 2019-04-19 DIAGNOSIS — D0439 Carcinoma in situ of skin of other parts of face: Secondary | ICD-10-CM | POA: Diagnosis not present

## 2019-04-22 ENCOUNTER — Telehealth: Payer: Self-pay | Admitting: Interventional Cardiology

## 2019-04-22 NOTE — Telephone Encounter (Signed)
Patient calling the office for samples of medication: ° ° °1.  What medication and dosage are you requesting samples for? rivaroxaban (XARELTO) 20 MG TABS tablet ° °2.  Are you currently out of this medication? yes ° ° °

## 2019-04-26 NOTE — Telephone Encounter (Signed)
Called and spoke with the patient and he is not out yet but will be in a week and is in the doughnut hole. Needs enough to get him through until he can order his 90 day supply.  I let him know I would have 2 weeks worth of samples downstairs for him to pick up Monday morning.

## 2019-04-29 DIAGNOSIS — H353211 Exudative age-related macular degeneration, right eye, with active choroidal neovascularization: Secondary | ICD-10-CM | POA: Diagnosis not present

## 2019-05-14 DIAGNOSIS — R05 Cough: Secondary | ICD-10-CM | POA: Diagnosis not present

## 2019-05-14 DIAGNOSIS — G4734 Idiopathic sleep related nonobstructive alveolar hypoventilation: Secondary | ICD-10-CM | POA: Diagnosis not present

## 2019-05-14 DIAGNOSIS — R0602 Shortness of breath: Secondary | ICD-10-CM | POA: Diagnosis not present

## 2019-05-14 DIAGNOSIS — J45909 Unspecified asthma, uncomplicated: Secondary | ICD-10-CM | POA: Diagnosis not present

## 2019-05-14 DIAGNOSIS — J4540 Moderate persistent asthma, uncomplicated: Secondary | ICD-10-CM | POA: Diagnosis not present

## 2019-05-14 DIAGNOSIS — J984 Other disorders of lung: Secondary | ICD-10-CM | POA: Diagnosis not present

## 2019-05-19 ENCOUNTER — Other Ambulatory Visit: Payer: Self-pay | Admitting: Family Medicine

## 2019-05-20 NOTE — Telephone Encounter (Signed)
Ok to refill??  Last office visit 09/10/2018.  Last refill 02/15/2019, # 1 refill.

## 2019-05-22 DIAGNOSIS — H353221 Exudative age-related macular degeneration, left eye, with active choroidal neovascularization: Secondary | ICD-10-CM | POA: Diagnosis not present

## 2019-06-04 DIAGNOSIS — H353211 Exudative age-related macular degeneration, right eye, with active choroidal neovascularization: Secondary | ICD-10-CM | POA: Diagnosis not present

## 2019-06-12 ENCOUNTER — Other Ambulatory Visit: Payer: Self-pay | Admitting: Family Medicine

## 2019-06-12 NOTE — Telephone Encounter (Signed)
Requesting refill  Klonopin  LOV:  09/10/18   LRF:   05/20/19

## 2019-06-13 MED ORDER — CLONAZEPAM 0.5 MG PO TABS: ORAL_TABLET | ORAL | 1 refills | Status: DC

## 2019-06-14 DIAGNOSIS — R0602 Shortness of breath: Secondary | ICD-10-CM | POA: Diagnosis not present

## 2019-06-14 DIAGNOSIS — G4734 Idiopathic sleep related nonobstructive alveolar hypoventilation: Secondary | ICD-10-CM | POA: Diagnosis not present

## 2019-06-14 DIAGNOSIS — J4540 Moderate persistent asthma, uncomplicated: Secondary | ICD-10-CM | POA: Diagnosis not present

## 2019-06-14 DIAGNOSIS — J984 Other disorders of lung: Secondary | ICD-10-CM | POA: Diagnosis not present

## 2019-06-14 DIAGNOSIS — R05 Cough: Secondary | ICD-10-CM | POA: Diagnosis not present

## 2019-06-14 DIAGNOSIS — J45909 Unspecified asthma, uncomplicated: Secondary | ICD-10-CM | POA: Diagnosis not present

## 2019-06-18 DIAGNOSIS — R972 Elevated prostate specific antigen [PSA]: Secondary | ICD-10-CM | POA: Diagnosis not present

## 2019-06-20 ENCOUNTER — Ambulatory Visit: Payer: PPO | Attending: Internal Medicine

## 2019-06-20 DIAGNOSIS — Z23 Encounter for immunization: Secondary | ICD-10-CM | POA: Insufficient documentation

## 2019-06-20 DIAGNOSIS — H43391 Other vitreous opacities, right eye: Secondary | ICD-10-CM | POA: Diagnosis not present

## 2019-06-20 DIAGNOSIS — H353211 Exudative age-related macular degeneration, right eye, with active choroidal neovascularization: Secondary | ICD-10-CM | POA: Diagnosis not present

## 2019-06-20 DIAGNOSIS — H35451 Secondary pigmentary degeneration, right eye: Secondary | ICD-10-CM | POA: Diagnosis not present

## 2019-06-20 DIAGNOSIS — H43811 Vitreous degeneration, right eye: Secondary | ICD-10-CM | POA: Diagnosis not present

## 2019-06-20 DIAGNOSIS — S0511XA Contusion of eyeball and orbital tissues, right eye, initial encounter: Secondary | ICD-10-CM | POA: Diagnosis not present

## 2019-06-20 DIAGNOSIS — H35361 Drusen (degenerative) of macula, right eye: Secondary | ICD-10-CM | POA: Diagnosis not present

## 2019-06-20 DIAGNOSIS — H5319 Other subjective visual disturbances: Secondary | ICD-10-CM | POA: Diagnosis not present

## 2019-06-20 NOTE — Progress Notes (Signed)
   Covid-19 Vaccination Clinic  Name:  Tyler Allison    MRN: [id number] DOB: 05-27-1940  06/20/2019  [name] was observed post Covid-19 immunization for 15 minutes without incidence. He was provided with Vaccine Information Sheet and instruction to access the V-Safe system.   [name] was instructed to call 911 with any severe reactions post vaccine: Marland Kitchen Difficulty breathing  . Swelling of your face and throat  . A fast heartbeat  . A bad rash all over your body  . Dizziness and weakness    Immunizations Administered    Name Date Dose VIS Date Route   Pfizer COVID-19 Vaccine 06/20/2019 12:06 PM 0.3 mL 04/19/2019 Intramuscular   Manufacturer: [place]   Lot: [id number]   [place]: [id number]

## 2019-06-25 DIAGNOSIS — R972 Elevated prostate specific antigen [PSA]: Secondary | ICD-10-CM | POA: Diagnosis not present

## 2019-06-25 DIAGNOSIS — N401 Enlarged prostate with lower urinary tract symptoms: Secondary | ICD-10-CM | POA: Diagnosis not present

## 2019-06-25 DIAGNOSIS — R351 Nocturia: Secondary | ICD-10-CM | POA: Diagnosis not present

## 2019-07-02 ENCOUNTER — Ambulatory Visit: Payer: PPO | Admitting: Internal Medicine

## 2019-07-02 ENCOUNTER — Encounter: Payer: Self-pay | Admitting: Internal Medicine

## 2019-07-02 ENCOUNTER — Other Ambulatory Visit: Payer: Self-pay

## 2019-07-02 VITALS — BP 120/68 | HR 90 | Temp 97.6°F | Ht 70.0 in | Wt 206.6 lb

## 2019-07-02 DIAGNOSIS — J4540 Moderate persistent asthma, uncomplicated: Secondary | ICD-10-CM | POA: Diagnosis not present

## 2019-07-02 DIAGNOSIS — I749 Embolism and thrombosis of unspecified artery: Secondary | ICD-10-CM | POA: Diagnosis not present

## 2019-07-02 DIAGNOSIS — G4734 Idiopathic sleep related nonobstructive alveolar hypoventilation: Secondary | ICD-10-CM | POA: Diagnosis not present

## 2019-07-02 NOTE — Addendum Note (Signed)
Addended by: June Leap on: 07/02/2019 04:46 PM   Modules accepted: Orders

## 2019-07-02 NOTE — Patient Instructions (Signed)
Order- schedule CTangio chest- PE protocol    Question chronic thromboembolism  Order- schedule 6 minute walk test     Dx Dyspnea on exertion  Please call if we can help

## 2019-07-02 NOTE — Assessment & Plan Note (Signed)
Benefits from use of O2 2L with sleep

## 2019-07-02 NOTE — Assessment & Plan Note (Addendum)
Dyspnea with exertion and productive morning cough would be consistent with a chronic bronchitis. Only rescue inhaler has helped any. His description of acute or subacute increase DOE raises possibility of PE, with persistent  Clot. Plan- CTa chest for CTE syndrome.

## 2019-07-02 NOTE — Progress Notes (Signed)
HPI M former smoker followed for dyspnea, cough  Asthma, complicated by HBP, Hypercholesterolemia, PAFib, Lung nodule(2012), Cervical spondylosis, Dysphagia Labs- nl CMET, BNP and Hgb 08/28/2018 Echo 08/28/2018- EF 55-60%, no wall motion abnl or PH PFT 01/15/2019- Minimal obstruction, no resp to BD, Nl lung volumes, Nl Diffusion ------------------------------------------------------------------------  03/01/2019-  78 yoM former smoker followed for dyspnea cough  Asthma, complicated by HBP, Hypercholesterolemia, PAFib, Lung nodule(2012), Cervical spondylosis, Dysphagia Proair, Flonase, Symbicort 160/ Anoro sample, Zyrtec, ------pt reports chronic wheezing and shortness of breath, coughing up light yellow-clear mucus in the morning and grayish mucus during the day No help from Symbicort or Anoro, continues ProAir hfa,  Prednisone taper helped most.  Labs- CBC wnl, Covid Neg, IgE 181 H,  Ba Swallow UGI 11/19/18-  Nonspecific esophageal motility disorder. Otherwise unremarkable Exam.   Continues Protonix 40 bid He again relates that awareness of exertional dyspnea began after prolonged sinus infection last winter, and that he now gets DOE much more easily than last year.  Describes "smothering " feeling. Still wheeze and cough with scant clear phlegm.  PFT 01/15/2019- Minimal obstruction, no resp to BD, Nl lung volumes, Nl Diffusion ONOX on room air reported 10/19 phone to him- desaturated during REM, enough to qualify for sleep O2. Report being scanned.  07/02/19- 78 yoM former smoker followed for dyspnea, cough, Nocturnal Hypoxemia,  Asthma, complicated by HBP, Hypercholesterolemia, PAFib, Lung nodule(2012), Cervical spondylosis, Dysphagia Proair, Flonase, Symbicort 160/ Anoro sample, Zyrtec, O2 2L sleep/ [employer] -----f/u Noctural Hypoxemia. O2 2L at night.  O2  Is a nuisance but admits he sleeps better with it. Confined by covid and not walking more than a few hundred feet back and forth to  mailbox. He still says he and a friend got up to walking 2 miles/ day. He then remembers getting an illness described as sinus infection last year, with distinct drop in exercise tolerance at that time. Frequent morning cough and phlegm treated with antihistamine. Occ calf cramps, but no claudication.  Proair helps some if he knows asthma active- last was 3 weeks ago. Other inhalers no help.   ROS-see HPI  + = positive Constitutional:    weight loss, night sweats, fevers, chills, fatigue, lassitude. HEENT:    headaches, difficulty swallowing, tooth/dental problems, sore throat,       sneezing, itching, ear ache, nasal congestion, post nasal drip, snoring CV:    chest pain, orthopnea, PND, swelling in lower extremities, anasarca,                                  dizziness, palpitations Resp:   +shortness of breath with exertion or at rest.                +productive cough,   +non-productive cough, coughing up of blood.              change in color of mucus.  wheezing.   Skin:    rash or lesions. GI:  No-   heartburn, indigestion, abdominal pain, nausea, vomiting, diarrhea,                 change in bowel habits, loss of appetite GU: dysuria, change in color of urine, no urgency or frequency.   flank pain. MS:   joint pain, stiffness, decreased range of motion, back pain. Neuro-     nothing unusual Psych:  change in mood or affect.  depression or anxiety.  memory loss.  OBJ- Physical Exam General- Alert, Oriented, Affect-appropriate, Distress- none acute Skin- rash-none, lesions- none, excoriation- none Lymphadenopathy- none Head- atraumatic            Eyes- Gross vision intact, PERRLA, conjunctivae and secretions clear            Ears- Hearing, canals-normal            Nose- Clear, no-Septal dev, mucus, polyps, erosion, perforation             Throat- Mallampati II , mucosa clear , drainage- none, tonsils- atrophic,  Neck- flexible , trachea midline, no stridor , thyroid nl, carotid no  bruit Chest - symmetrical excursion , unlabored           Heart/CV- RRR faint , no murmur , no gallop  , no rub, nl s1 s2                           - JVD- none , edema- none, stasis changes- none, varices- none           Lung- +few crackles L base, wheeze- none, cough- none , dullness-none, rub- none           Chest wall-  Abd-  Br/ Gen/ Rectal- Not done, not indicated Extrem- cyanosis- none, clubbing, none, atrophy- none, strength- nl Neuro- grossly intact to observation

## 2019-07-03 ENCOUNTER — Other Ambulatory Visit (INDEPENDENT_AMBULATORY_CARE_PROVIDER_SITE_OTHER): Payer: PPO

## 2019-07-03 ENCOUNTER — Other Ambulatory Visit: Payer: Self-pay

## 2019-07-03 DIAGNOSIS — I749 Embolism and thrombosis of unspecified artery: Secondary | ICD-10-CM | POA: Diagnosis not present

## 2019-07-03 LAB — BASIC METABOLIC PANEL
BUN: 18 mg/dL (ref 6–23)
CO2: 28 mEq/L (ref 19–32)
Calcium: 9.1 mg/dL (ref 8.4–10.5)
Chloride: 101 mEq/L (ref 96–112)
Creatinine, Ser: 0.97 mg/dL (ref 0.40–1.50)
GFR: 74.56 mL/min (ref >60.00)
Glucose, Bld: 137 mg/dL — ABNORMAL HIGH (ref 70–99)
Potassium: 3.9 mEq/L (ref 3.5–5.1)
Sodium: 136 mEq/L (ref 135–145)

## 2019-07-04 ENCOUNTER — Telehealth: Payer: Self-pay | Admitting: Internal Medicine

## 2019-07-04 NOTE — Telephone Encounter (Signed)
Spoke with pt and advised of lab results per Dr Annamaria Boots. Pt  Verbalized understanding. Nothing further needed.

## 2019-07-05 ENCOUNTER — Other Ambulatory Visit: Payer: Self-pay

## 2019-07-05 ENCOUNTER — Ambulatory Visit (INDEPENDENT_AMBULATORY_CARE_PROVIDER_SITE_OTHER)
Admission: RE | Admit: 2019-07-05 | Discharge: 2019-07-05 | Disposition: A | Discharge status: Home / Self Care | Payer: PPO | Source: Ambulatory Visit | Attending: Internal Medicine | Admitting: Internal Medicine

## 2019-07-05 DIAGNOSIS — I749 Embolism and thrombosis of unspecified artery: Secondary | ICD-10-CM | POA: Diagnosis not present

## 2019-07-05 DIAGNOSIS — R0602 Shortness of breath: Secondary | ICD-10-CM | POA: Diagnosis not present

## 2019-07-05 MED ORDER — IOHEXOL 300 MG/ML  SOLN
80.0000 mL | Freq: Once | INTRAMUSCULAR | Status: AC | PRN
  Administered 2019-07-05: 11:00:00 80 mL via INTRAVENOUS

## 2019-07-09 DIAGNOSIS — H353211 Exudative age-related macular degeneration, right eye, with active choroidal neovascularization: Secondary | ICD-10-CM | POA: Diagnosis not present

## 2019-07-10 DIAGNOSIS — H35453 Secondary pigmentary degeneration, bilateral: Secondary | ICD-10-CM | POA: Diagnosis not present

## 2019-07-10 DIAGNOSIS — H35363 Drusen (degenerative) of macula, bilateral: Secondary | ICD-10-CM | POA: Diagnosis not present

## 2019-07-10 DIAGNOSIS — H353211 Exudative age-related macular degeneration, right eye, with active choroidal neovascularization: Secondary | ICD-10-CM | POA: Diagnosis not present

## 2019-07-10 DIAGNOSIS — H353221 Exudative age-related macular degeneration, left eye, with active choroidal neovascularization: Secondary | ICD-10-CM | POA: Diagnosis not present

## 2019-07-10 DIAGNOSIS — H43813 Vitreous degeneration, bilateral: Secondary | ICD-10-CM | POA: Diagnosis not present

## 2019-07-10 DIAGNOSIS — Z961 Presence of intraocular lens: Secondary | ICD-10-CM | POA: Diagnosis not present

## 2019-07-12 ENCOUNTER — Other Ambulatory Visit: Payer: Self-pay | Admitting: Family Medicine

## 2019-07-12 DIAGNOSIS — G4734 Idiopathic sleep related nonobstructive alveolar hypoventilation: Secondary | ICD-10-CM | POA: Diagnosis not present

## 2019-07-12 DIAGNOSIS — J45909 Unspecified asthma, uncomplicated: Secondary | ICD-10-CM | POA: Diagnosis not present

## 2019-07-12 DIAGNOSIS — J4540 Moderate persistent asthma, uncomplicated: Secondary | ICD-10-CM | POA: Diagnosis not present

## 2019-07-12 DIAGNOSIS — R05 Cough: Secondary | ICD-10-CM | POA: Diagnosis not present

## 2019-07-12 DIAGNOSIS — R0602 Shortness of breath: Secondary | ICD-10-CM | POA: Diagnosis not present

## 2019-07-12 DIAGNOSIS — J984 Other disorders of lung: Secondary | ICD-10-CM | POA: Diagnosis not present

## 2019-07-12 MED ORDER — METOPROLOL SUCCINATE ER 25 MG PO TB24: ORAL_TABLET | ORAL | 2 refills | Status: DC

## 2019-07-13 ENCOUNTER — Ambulatory Visit: Payer: PPO | Attending: Internal Medicine

## 2019-07-13 DIAGNOSIS — Z23 Encounter for immunization: Secondary | ICD-10-CM

## 2019-07-13 NOTE — Progress Notes (Signed)
   Covid-19 Vaccination Clinic  Name:  Tyler Allison    MRN: [id number] DOB: 1941-04-17  07/13/2019  Mr. Dyess was observed post Covid-19 immunization for 15 minutes without incident. He was provided with Vaccine Information Sheet and instruction to access the V-Safe system.   Mr. Bertani was instructed to call 911 with any severe reactions post vaccine: Marland Kitchen Difficulty breathing  . Swelling of face and throat  . A fast heartbeat  . A bad rash all over body  . Dizziness and weakness   Immunizations Administered    Name Date Dose VIS Date Route   Pfizer COVID-19 Vaccine 07/13/2019  1:27 PM 0.3 mL 04/19/2019 Intramuscular   Manufacturer: [place]   Lot: [id number]   [place]: [id number]

## 2019-08-09 DIAGNOSIS — M25512 Pain in left shoulder: Secondary | ICD-10-CM | POA: Diagnosis not present

## 2019-08-12 ENCOUNTER — Ambulatory Visit (INDEPENDENT_AMBULATORY_CARE_PROVIDER_SITE_OTHER): Payer: PPO | Admitting: Otolaryngology

## 2019-08-12 DIAGNOSIS — J4540 Moderate persistent asthma, uncomplicated: Secondary | ICD-10-CM | POA: Diagnosis not present

## 2019-08-12 DIAGNOSIS — G4734 Idiopathic sleep related nonobstructive alveolar hypoventilation: Secondary | ICD-10-CM | POA: Diagnosis not present

## 2019-08-12 DIAGNOSIS — J45909 Unspecified asthma, uncomplicated: Secondary | ICD-10-CM | POA: Diagnosis not present

## 2019-08-12 DIAGNOSIS — J984 Other disorders of lung: Secondary | ICD-10-CM | POA: Diagnosis not present

## 2019-08-12 DIAGNOSIS — R05 Cough: Secondary | ICD-10-CM | POA: Diagnosis not present

## 2019-08-12 DIAGNOSIS — R0602 Shortness of breath: Secondary | ICD-10-CM | POA: Diagnosis not present

## 2019-08-13 DIAGNOSIS — H353211 Exudative age-related macular degeneration, right eye, with active choroidal neovascularization: Secondary | ICD-10-CM | POA: Diagnosis not present

## 2019-08-18 NOTE — Progress Notes (Signed)
Cardiology Office Note   Date:  08/19/2019   ID:  Sadler, Malinak Jul 23, 1940, MRN [id number]  PCP:  [name], MD    No chief complaint on file.  AFib  Wt Readings from Last 3 Encounters:  08/19/19 197 lb 1.9 oz (89.4 kg)  07/02/19 206 lb 9.6 oz (93.7 kg)  03/01/19 205 lb 12.8 oz (93.4 kg)       History of Present Illness: Tyler Allison is a 79 y.o. male  Who had neck surgery on 01/27/16. The anesthesia MD mentioned some irregular heart rhythm. Dr. Dennard Schaumann did a Holter and this showed PAF 15 days post op. He had some bradycardia at 3AM, presumably during sleep, (2 second pause).He was started on Xarelto.AFib rate has been as high as 142 bpm. Even with that, he did not have sx.  In 12/19, he had a bad sinus infection.  Since then, he has not had the same stamina.  He is wheezing.  He has had lung issues.    Sx of DOE have persisted. He can no longer walk a few miles like he used to.    ENT checked his airway and there were no issues per his report.    He used to have allergies.  He used to get shots, but now takes an OTC tablet.   He starts the morning with a cough that brings up some phlegm in the morning.   At visit in April 2020, I thought his shortness of breath was more pulmonary.  Pro-BNP was low at 187.  He was going to discuss inhaled steroid with his PCP to see if this would help.   Since the last visit, he still has DOE, with walking, or moving furniture.  He has to stop now frequently when he walks to check the mail.  He can feel SHOB even when at rest.  Echo 08/28/2018- EF 55-60%, no wall motion abnl or PH PFT 01/15/2019- Minimal obstruction, no resp to BD, Nl lung volumes, Nl Diffusion  2/21 CT showed: "No evidence of pulmonary embolism. No acute intrathoracic process. 2.  Aortic atherosclerosis (ICD10-I70.0)."  He has had asthma since he was a child.  He has some relief from his inhalers.     Past Medical History:  Diagnosis  Date  . Allergy   . Asthma   . Cervical spondylosis with radiculopathy    left c6 radiculopathy  . Chronic back pain   . GERD (gastroesophageal reflux disease)   . Hyperlipidemia   . Hypertension     Past Surgical History:  Procedure Laterality Date  . tympanoplasty with mastoidectomy Left 01/13/2004     Current Outpatient Medications  Medication Sig Dispense Refill  . albuterol (PROAIR HFA) 108 (90 BASE) MCG/ACT inhaler Inhale 2 puffs into the lungs every 4 (four) hours as needed. 8.5 g 3  . atorvastatin (LIPITOR) 40 MG tablet TAKE 1 TABLET(40 MG) BY MOUTH DAILY 90 tablet 2  . clonazePAM (KLONOPIN) 0.5 MG tablet TAKE 1 TABLET(0.5 MG) BY MOUTH TWICE DAILY AS NEEDED FOR ANXIETY 60 tablet 1  . fluticasone (FLONASE) 50 MCG/ACT nasal spray Place 2 sprays into both nostrils as needed for allergies.  4  . levofloxacin (LEVAQUIN) 750 MG tablet levofloxacin 750 mg tablet  TAKE 1 TABLET BY MOUTH MORNING OF BIOPSY    . metoprolol succinate (TOPROL-XL) 25 MG 24 hr tablet Take 1 tablet by mouth daily 90 tablet 2  . OXYGEN Inhale 2 L into the lungs  at bedtime. Use at night when going to bed.    . pantoprazole (PROTONIX) 40 MG tablet TAKE 1 TABLET(40 MG) BY MOUTH TWICE DAILY 180 tablet 3  . rivaroxaban (XARELTO) 20 MG TABS tablet Take 1 tablet by mouth daily with supper 30 tablet 1  . tamsulosin (FLOMAX) 0.4 MG CAPS capsule Take 1 capsule (0.4 mg total) by mouth daily. 90 capsule 3  . traMADol (ULTRAM) 50 MG tablet Take by mouth every 6 (six) hours as needed.     No current facility-administered medications for this visit.    Allergies:   Codeine    Social History:  The patient  reports that he quit smoking about 50 years ago. His smoking use included cigarettes. He started smoking about 64 years ago. He has a 6.50 pack-year smoking history. He has never used smokeless tobacco. He reports that he does not drink alcohol or use drugs.   Family History:  The patient's family history includes  Heart attack in his maternal uncle; Heart failure in his mother.    ROS:  Please see the history of present illness.   Otherwise, review of systems are positive for chronic DOE.   All other systems are reviewed and negative.    PHYSICAL EXAM: VS:  BP 136/88   Pulse 60   Ht 5\' 10"  (1.778 m)   Wt 197 lb 1.9 oz (89.4 kg)   SpO2 97%   BMI 28.28 kg/m  , BMI Body mass index is 28.28 kg/m. GEN: Well nourished, well developed, in no acute distress  HEENT: normal  Neck: no JVD, carotid bruits, or masses Cardiac: RRR; no murmurs, rubs, or gallops,no edema  Respiratory:  clear to auscultation bilaterally, normal work of breathing; mild hoarseness GI: soft, nontender, nondistended, + BS MS: no deformity or atrophy  Skin: warm and dry, no rash Neuro:  Strength and sensation are intact Psych: euthymic mood, full affect   EKG:   The ekg ordered today demonstrates NSR, no ST changes   Recent Labs: 08/28/2018: ALT 16; NT-Pro BNP 187 01/15/2019: Hemoglobin 14.5; Platelets 232.0 07/03/2019: BUN 18; Creatinine, Ser 0.97; Potassium 3.9; Sodium 136   Lipid Panel    Component Value Date/Time   CHOL 138 02/26/2018 0802   TRIG 104 02/26/2018 0802   HDL 41 02/26/2018 0802   CHOLHDL 3.4 02/26/2018 0802   VLDL 31 (H) 03/13/2015 0816   LDLCALC 78 02/26/2018 0802     Other studies Reviewed: Additional studies/ records that were reviewed today with results demonstrating: normal electrolytes in 06/2019;normal Hbg in 01/2019.   ASSESSMENT AND PLAN:  1. Atrial fibrillation: In NSR.  No sx of AFib.    2. Anticoagulated: Xarelto for stroke prevention.  No bleeding problems.  3. Hyperlipidemia: Continue atorvastatin.  Well controlled in 2019.  Will need recheck with PMD. 4. Shortness of breath: Persistent.  He has  6 minute walk scheduled.  No obvious cardiac etiology of shortness of breath noted.   Current medicines are reviewed at length with the patient today.  The patient concerns regarding his  medicines were addressed.  The following changes have been made:  No change  Labs/ tests ordered today include:  No orders of the defined types were placed in this encounter.   Recommend 150 minutes/week of aerobic exercise Low fat, low carb, high fiber diet recommended  Disposition:   FU in 1 year   Signed, Larae Grooms, MD  08/19/2019 11:58 AM    [facility]  [address], [place], [place]  [address] Phone: [phone]; Fax: [phone]

## 2019-08-19 ENCOUNTER — Other Ambulatory Visit: Payer: Self-pay

## 2019-08-19 ENCOUNTER — Ambulatory Visit: Payer: PPO | Admitting: Interventional Cardiology

## 2019-08-19 ENCOUNTER — Encounter: Payer: Self-pay | Admitting: Interventional Cardiology

## 2019-08-19 VITALS — BP 136/88 | HR 60 | Ht 70.0 in | Wt 197.1 lb

## 2019-08-19 DIAGNOSIS — Z7901 Long term (current) use of anticoagulants: Secondary | ICD-10-CM | POA: Diagnosis not present

## 2019-08-19 DIAGNOSIS — E782 Mixed hyperlipidemia: Secondary | ICD-10-CM | POA: Diagnosis not present

## 2019-08-19 DIAGNOSIS — I480 Paroxysmal atrial fibrillation: Secondary | ICD-10-CM | POA: Diagnosis not present

## 2019-08-19 NOTE — Patient Instructions (Signed)

## 2019-08-30 ENCOUNTER — Ambulatory Visit: Payer: PPO | Admitting: Internal Medicine

## 2019-09-05 DIAGNOSIS — C61 Malignant neoplasm of prostate: Secondary | ICD-10-CM | POA: Diagnosis not present

## 2019-09-05 DIAGNOSIS — R972 Elevated prostate specific antigen [PSA]: Secondary | ICD-10-CM | POA: Diagnosis not present

## 2019-09-06 ENCOUNTER — Telehealth: Payer: Self-pay | Admitting: Interventional Cardiology

## 2019-09-06 NOTE — Telephone Encounter (Signed)
Does not look like we ever received a clearance request for pt to stop his anticoagulation for biopsy.  He takes Xarelto for afib with CHADS2VASc score of 4 (age x2, HTN, CAD). Renal function is normal.   He held Xarelto for 3 days prior to procedure. If he resumes Xarelto on 5/3, he will have been off of anticoagulation for a total of 7 days. Should be ok to resume 5/3 as recommended by his procedural MD due to lower cardiovascular risk.

## 2019-09-06 NOTE — Telephone Encounter (Signed)
Called and made patient aware that per PharmD patient can remain off of Xarelto and resume on 5/3 per procedural MD's recommendations. He verbalized understanding and thanked me for the call.

## 2019-09-06 NOTE — Telephone Encounter (Signed)
Pt c/o medication issue:  1. Name of Medication: rivaroxaban (XARELTO) 20 MG TABS tablet  2. How are you currently taking this medication (dosage and times per day)? Has not taken medication since 09/02/19  3. Are you having a reaction (difficulty breathing--STAT)? No   4. What is your medication issue? [employer] is calling stating he hasn't taken Xarelto since 09/02/19 due to having a biopsy done on his prostate yesterday. The Doctor advised him after the procedure due to being fearful he would bleed that he wants him to continue to hold the Xarelto until Monday 09/09/19. A[place] states he's never been off of this medication for more than two days and he wanted to check with Dr. Irish Lack first due to that being six days without it. Please advise.

## 2019-09-06 NOTE — Telephone Encounter (Signed)
I will forward this call to our Pre OP PharmD for advice as well as to Dr. Hassell Done nurse as Juluis Rainier.

## 2019-09-11 DIAGNOSIS — J45909 Unspecified asthma, uncomplicated: Secondary | ICD-10-CM | POA: Diagnosis not present

## 2019-09-11 DIAGNOSIS — R0602 Shortness of breath: Secondary | ICD-10-CM | POA: Diagnosis not present

## 2019-09-11 DIAGNOSIS — J984 Other disorders of lung: Secondary | ICD-10-CM | POA: Diagnosis not present

## 2019-09-11 DIAGNOSIS — J4540 Moderate persistent asthma, uncomplicated: Secondary | ICD-10-CM | POA: Diagnosis not present

## 2019-09-11 DIAGNOSIS — R05 Cough: Secondary | ICD-10-CM | POA: Diagnosis not present

## 2019-09-11 DIAGNOSIS — G4734 Idiopathic sleep related nonobstructive alveolar hypoventilation: Secondary | ICD-10-CM | POA: Diagnosis not present

## 2019-09-17 DIAGNOSIS — H353211 Exudative age-related macular degeneration, right eye, with active choroidal neovascularization: Secondary | ICD-10-CM | POA: Diagnosis not present

## 2019-09-24 DIAGNOSIS — C61 Malignant neoplasm of prostate: Secondary | ICD-10-CM | POA: Diagnosis not present

## 2019-09-25 ENCOUNTER — Telehealth: Payer: Self-pay | Admitting: Internal Medicine

## 2019-09-25 NOTE — Telephone Encounter (Signed)
Spoke with pt, advised him to call his DME company or go to [email or web address] to get assistance for out of town oxygen. Pt understood and nothing further is needed.

## 2019-10-12 DIAGNOSIS — G4734 Idiopathic sleep related nonobstructive alveolar hypoventilation: Secondary | ICD-10-CM | POA: Diagnosis not present

## 2019-10-12 DIAGNOSIS — J45909 Unspecified asthma, uncomplicated: Secondary | ICD-10-CM | POA: Diagnosis not present

## 2019-10-12 DIAGNOSIS — R0602 Shortness of breath: Secondary | ICD-10-CM | POA: Diagnosis not present

## 2019-10-12 DIAGNOSIS — R05 Cough: Secondary | ICD-10-CM | POA: Diagnosis not present

## 2019-10-12 DIAGNOSIS — J4540 Moderate persistent asthma, uncomplicated: Secondary | ICD-10-CM | POA: Diagnosis not present

## 2019-10-12 DIAGNOSIS — J984 Other disorders of lung: Secondary | ICD-10-CM | POA: Diagnosis not present

## 2019-10-25 ENCOUNTER — Other Ambulatory Visit: Payer: Self-pay | Admitting: Family Medicine

## 2019-10-25 MED ORDER — CLONAZEPAM 0.5 MG PO TABS: ORAL_TABLET | ORAL | 1 refills | Status: AC

## 2019-10-25 NOTE — Telephone Encounter (Signed)
Patient called in stating that he is still having issues with anxiety however the clonazepam does help. He would like to know if you could send in another refill. Please advise?

## 2019-10-29 DIAGNOSIS — H353211 Exudative age-related macular degeneration, right eye, with active choroidal neovascularization: Secondary | ICD-10-CM | POA: Diagnosis not present

## 2019-10-30 ENCOUNTER — Ambulatory Visit (INDEPENDENT_AMBULATORY_CARE_PROVIDER_SITE_OTHER): Payer: PPO

## 2019-10-30 ENCOUNTER — Ambulatory Visit: Payer: PPO | Admitting: Internal Medicine

## 2019-10-30 ENCOUNTER — Other Ambulatory Visit: Payer: Self-pay

## 2019-10-30 ENCOUNTER — Encounter: Payer: Self-pay | Admitting: Internal Medicine

## 2019-10-30 VITALS — BP 124/76 | HR 67 | Temp 97.9°F | Ht 71.0 in | Wt 203.4 lb

## 2019-10-30 DIAGNOSIS — J4540 Moderate persistent asthma, uncomplicated: Secondary | ICD-10-CM

## 2019-10-30 DIAGNOSIS — R0600 Dyspnea, unspecified: Secondary | ICD-10-CM

## 2019-10-30 DIAGNOSIS — G4734 Idiopathic sleep related nonobstructive alveolar hypoventilation: Secondary | ICD-10-CM

## 2019-10-30 DIAGNOSIS — G4733 Obstructive sleep apnea (adult) (pediatric): Secondary | ICD-10-CM

## 2019-10-30 NOTE — Patient Instructions (Signed)
Order- schedule home sleep test   Dx Nocturnal hypoxemia      You won't wear your home oxygen on this study night  Please call us about 2 weeks after your sleep test to see if results and recommendations are ready yet. If appropriate, we may be able to start treatment and make decisions about your oxygen at night, before we see you back.

## 2019-10-30 NOTE — Assessment & Plan Note (Signed)
Daily morning cough with clear phlegm and bronchial thickening on imaging suggest a mild chronic bronchitis. Plan- continue prn rescue inhaler for now, since no benefit noted with Symbicort or Anoro.

## 2019-10-30 NOTE — Progress Notes (Signed)
HPI M former smoker followed for dyspnea, cough  Asthma, complicated by HBP, Hypercholesterolemia, PAFib, Lung nodule(2012), Cervical spondylosis, Dysphagia Labs- nl CMET, BNP and Hgb 08/28/2018 Echo 08/28/2018- EF 55-60%, no wall motion abnl or PH PFT 01/15/2019- Minimal obstruction, no resp to BD, Nl lung volumes, Nl Diffusion CTa chest 06/21/19- Aortic atherosclerosis, lungs clear  ------------------------------------------------------------------------   07/02/19- 78 yoM former smoker followed for dyspnea, cough, Nocturnal Hypoxemia,  Asthma, complicated by HBP, Hypercholesterolemia, PAFib, Lung nodule(2012), Cervical spondylosis, Dysphagia Proair, Flonase, Symbicort 160/ Anoro sample, Zyrtec, O2 2L sleep/ [facility] -----f/u Noctural Hypoxemia. O2 2L at night.  O2  Is a nuisance but admits he sleeps better with it. Confined by covid and not walking more than a few hundred feet back and forth to mailbox. He still says he and a friend got up to walking 2 miles/ day. He then remembers getting an illness described as sinus infection last year, with distinct drop in exercise tolerance at that time. Frequent morning cough and phlegm treated with antihistamine. Occ calf cramps, but no claudication.  Proair helps some if he knows asthma active- last was 3 weeks ago. Other inhalers no help.   10/30/19- 78 yoM former smoker followed for dyspnea, cough, Nocturnal Hypoxemia,  Asthma, complicated by HBP, Hypercholesterolemia, PAFib, Lung nodule(2012), Cervical spondylosis, Dysphagia, Prostate Cancer, Proair, Flonase, Symbicort 160/ Anoro sample, Zyrtec, O2 2L sleep/ [facility] He finds O2 noisy and cumbersome trying to take his concentrator out of town, but does ssleep more restfully with it.  Reports DOE seems better now. Never noted response to Symbicort or Anoro. Rare wheeze he recognizes as asthma, does respond to albuterol hfa.  Snores "some". Had 2 Phizer Covax Dx'd with Prostate Cancer being followed  with observation for now.  Cardiology follows PAFib. 6MWT 10/30/19- 425 meters, lowest O2 sat on room air 95%, max HR 80 CTa chest 07/05/19-  IMPRESSION: 1. No evidence of pulmonary embolism. No acute intrathoracic process. 2.  Aortic atherosclerosis (ICD10-I70.0).  ROS-see HPI  + = positive Constitutional:    weight loss, night sweats, fevers, chills, fatigue, lassitude. HEENT:    headaches, difficulty swallowing, tooth/dental problems, sore throat,       sneezing, itching, ear ache, nasal congestion, post nasal drip, snoring CV:    chest pain, orthopnea, PND, swelling in lower extremities, anasarca,                                  dizziness, palpitations Resp:   +shortness of breath with exertion or at rest.                +productive cough,   +non-productive cough, coughing up of blood.              change in color of mucus.  wheezing.   Skin:    rash or lesions. GI:  No-   heartburn, indigestion, abdominal pain, nausea, vomiting, diarrhea,                 change in bowel habits, loss of appetite GU: dysuria, change in color of urine, no urgency or frequency.   flank pain. MS:   joint pain, stiffness, decreased range of motion, back pain. Neuro-     nothing unusual Psych:  change in mood or affect.  depression or anxiety.   memory loss.  OBJ- Physical Exam General- Alert, Oriented, Affect-appropriate, Distress- none acute, trim Skin- rash-none, lesions- none, excoriation- none Lymphadenopathy-  none Head- atraumatic            Eyes- Gross vision intact, PERRLA, conjunctivae and secretions clear            Ears- Hearing, canals-normal            Nose- Clear, no-Septal dev, mucus, polyps, erosion, perforation             Throat- Mallampati II , mucosa clear , drainage- none, tonsils- atrophic,  Neck- flexible , trachea midline, no stridor , thyroid nl, carotid no bruit Chest - symmetrical excursion , unlabored           Heart/CV- RRR faint , no murmur , no gallop  , no rub, nl s1  s2                           - JVD- none , edema- none, stasis changes- none, varices- none           Lung- +few crackles L base, wheeze- none, cough- none , dullness-none, rub- none           Chest wall-  Abd-  Br/ Gen/ Rectal- Not done, not indicated Extrem- cyanosis- none, clubbing, none, atrophy- none, strength- nl Neuro- grossly intact to observation

## 2019-10-30 NOTE — Assessment & Plan Note (Signed)
Oxygenates fine during the day. We will recheck night saturation. Since he snores, we will get HST checking for OSA. If hypoxemia has resolved we can stop O2. Otherwise he may benefit from a POC for travel, or CPAP.Marland Kitchen

## 2019-10-31 ENCOUNTER — Other Ambulatory Visit: Payer: Self-pay | Admitting: Family Medicine

## 2019-11-01 DIAGNOSIS — H353221 Exudative age-related macular degeneration, left eye, with active choroidal neovascularization: Secondary | ICD-10-CM | POA: Diagnosis not present

## 2019-11-11 DIAGNOSIS — J45909 Unspecified asthma, uncomplicated: Secondary | ICD-10-CM | POA: Diagnosis not present

## 2019-11-11 DIAGNOSIS — R05 Cough: Secondary | ICD-10-CM | POA: Diagnosis not present

## 2019-11-11 DIAGNOSIS — R0602 Shortness of breath: Secondary | ICD-10-CM | POA: Diagnosis not present

## 2019-11-11 DIAGNOSIS — J4540 Moderate persistent asthma, uncomplicated: Secondary | ICD-10-CM | POA: Diagnosis not present

## 2019-11-11 DIAGNOSIS — G4734 Idiopathic sleep related nonobstructive alveolar hypoventilation: Secondary | ICD-10-CM | POA: Diagnosis not present

## 2019-11-11 DIAGNOSIS — J984 Other disorders of lung: Secondary | ICD-10-CM | POA: Diagnosis not present

## 2019-11-22 ENCOUNTER — Other Ambulatory Visit: Payer: Self-pay

## 2019-11-22 MED ORDER — RIVAROXABAN 20 MG PO TABS: ORAL_TABLET | ORAL | 1 refills | Status: DC

## 2019-11-22 NOTE — Telephone Encounter (Signed)
Prescription refill request for Xarelto received.  Indication:Atrial Fibrillation Last office visit: 09/06/2019 Dr Irish Lack Weight:92.3 kg Age:79 Scr: 0.97 07/03/2019 CrCl: 80.62 ml/min

## 2019-12-03 ENCOUNTER — Ambulatory Visit (INDEPENDENT_AMBULATORY_CARE_PROVIDER_SITE_OTHER): Payer: PPO | Admitting: Family Medicine

## 2019-12-03 ENCOUNTER — Encounter: Payer: Self-pay | Admitting: Family Medicine

## 2019-12-03 ENCOUNTER — Other Ambulatory Visit: Payer: Self-pay

## 2019-12-03 VITALS — BP 110/74 | HR 63 | Temp 97.6°F | Ht 71.0 in | Wt 201.0 lb

## 2019-12-03 DIAGNOSIS — I480 Paroxysmal atrial fibrillation: Secondary | ICD-10-CM | POA: Diagnosis not present

## 2019-12-03 DIAGNOSIS — F320 Major depressive disorder, single episode, mild: Secondary | ICD-10-CM

## 2019-12-03 DIAGNOSIS — I4891 Unspecified atrial fibrillation: Secondary | ICD-10-CM | POA: Diagnosis not present

## 2019-12-03 MED ORDER — PAROXETINE HCL 20 MG PO TABS: 20.0000 mg | ORAL_TABLET | Freq: Every day | ORAL | 2 refills | Status: DC

## 2019-12-03 NOTE — Progress Notes (Signed)
Subjective:    Patient ID: Tyler Allison, male    DOB: 22-Aug-1940, 79 y.o.   MRN: [id number] Patient presents today just as a checkup.  He has a history of paroxysmal atrial fibrillation for which he takes Xarelto for stroke prevention.  He denies any bleeding.  He denies any melena or hematochezia.  He is due to recheck a CBC to monitor his hemoglobin.  He has not had his renal function tested in quite some time.  He is due for this as well.  He denies any chest pain.  He denies any syncope or near syncope.  He does report feeling sad more often.  He states that he is frequently crying for no reason.  He states that he will break out into tears if he watches a sad movie or even commercials such as for [facility].  He states that this is not normal for him.  He also has been dealing more with anxiety and has to take the Klonopin 1-2 times every day to help him sleep and relax.  He states that he feels lonely quite often as his wife is frequently at the beach.  He also is worried about the situation in her country with the virus and the pandemic and the several unrest.  This is all contributing to his anxiety and his depression.  He denies any suicidal ideation  Past Medical History:  Diagnosis Date  . Allergy   . Asthma   . Cervical spondylosis with radiculopathy    left c6 radiculopathy  . Chronic back pain   . GERD (gastroesophageal reflux disease)   . Hyperlipidemia   . Hypertension    Past Surgical History:  Procedure Laterality Date  . tympanoplasty with mastoidectomy Left 01/13/2004   Current Outpatient Medications on File Prior to Visit  Medication Sig Dispense Refill  . albuterol (PROAIR HFA) 108 (90 BASE) MCG/ACT inhaler Inhale 2 puffs into the lungs every 4 (four) hours as needed. 8.5 g 3  . atorvastatin (LIPITOR) 40 MG tablet TAKE 1 TABLET(40 MG) BY MOUTH DAILY 30 tablet 0  . clonazePAM (KLONOPIN) 0.5 MG tablet TAKE 1 TABLET(0.5 MG) BY MOUTH TWICE DAILY AS  NEEDED FOR ANXIETY 60 tablet 1  . fluticasone (FLONASE) 50 MCG/ACT nasal spray Place 2 sprays into both nostrils as needed for allergies.  4  . metoprolol succinate (TOPROL-XL) 25 MG 24 hr tablet Take 1 tablet by mouth daily 90 tablet 2  . OXYGEN Inhale 2 L into the lungs at bedtime. Use at night when going to bed.    . pantoprazole (PROTONIX) 40 MG tablet TAKE 1 TABLET(40 MG) BY MOUTH TWICE DAILY 180 tablet 3  . rivaroxaban (XARELTO) 20 MG TABS tablet Take 1 tablet by mouth daily with supper 90 tablet 1  . tamsulosin (FLOMAX) 0.4 MG CAPS capsule Take 1 capsule (0.4 mg total) by mouth daily. 90 capsule 3  . traMADol (ULTRAM) 50 MG tablet Take by mouth every 6 (six) hours as needed.     No current facility-administered medications on file prior to visit.   Allergies  Allergen Reactions  . Codeine Nausea And Vomiting   Social History   Socioeconomic History  . Marital status: Married    Spouse name: Not on file  . Number of children: Not on file  . Years of education: Not on file  . Highest education level: Not on file  Occupational History  . Not on file  Tobacco Use  . Smoking  status: Former Smoker    Packs/day: 0.50    Years: 13.00    Pack years: 6.50    Types: Cigarettes    Start date: 33    Quit date: 10/23/1968    Years since quitting: 51.1  . Smokeless tobacco: Never Used  Vaping Use  . Vaping Use: Never used  Substance and Sexual Activity  . Alcohol use: No  . Drug use: No  . Sexual activity: Not on file    Comment: married, retired.  Other Topics Concern  . Not on file  Social History Narrative  . Not on file   Social Determinants of Health   Financial Resource Strain:   . Difficulty of Paying Living Expenses:   Food Insecurity:   . Worried About Charity fundraiser in the Last Year:   . Arboriculturist in the Last Year:   Transportation Needs:   . Film/video editor (Medical):   Marland Kitchen Lack of Transportation (Non-Medical):   Physical Activity:   .  Days of Exercise per Week:   . Minutes of Exercise per Session:   Stress:   . Feeling of Stress :   Social Connections:   . Frequency of Communication with Friends and Family:   . Frequency of Social Gatherings with Friends and Family:   . Attends Religious Services:   . Active Member of Clubs or Organizations:   . Attends Archivist Meetings:   Marland Kitchen Marital Status:   Intimate Partner Violence:   . Fear of Current or Ex-Partner:   . Emotionally Abused:   Marland Kitchen Physically Abused:   . Sexually Abused:        Review of Systems  All other systems reviewed and are negative.      Objective:   Vital signs are noted Physical exam was performed today.  Patient has a regular a regular heart rhythm.  He has bibasilar crackles left greater than right.  There is no pitting edema in extremities his extremities.  There is no JVD.  Abdomen is soft nondistended nontender with normal bowel sounds.  Patient has good air exchange.  There is no expiratory wheezing.  He has no rhinorrhea.  There is no sinus tenderness to palpation.  There is no mucosal edema.  Oropharynx is clear on examination.  There is no lymphadenopathy in the neck.  There is no stridor.  Trachea is midline. Assessment & Plan:   Paroxysmal atrial fibrillation ([place]) - Plan: CBC with Differential/Platelet, COMPLETE METABOLIC PANEL WITH GFR  Depression, major, single episode, mild ([place])  Patient is in normal sinus rhythm this morning and heart rate is well controlled.  I will check a CBC to evaluate for occult anemia.  I will check a CMP to evaluate his renal function to ensure that his Xarelto dose is appropriate.  Add Paxil 20 mg a day for anxiety and depression and then reassess in 1 month.

## 2019-12-04 ENCOUNTER — Ambulatory Visit: Payer: PPO

## 2019-12-04 DIAGNOSIS — G4733 Obstructive sleep apnea (adult) (pediatric): Secondary | ICD-10-CM | POA: Diagnosis not present

## 2019-12-04 LAB — COMPLETE METABOLIC PANEL WITH GFR
AG Ratio: 1.4 (calc) (ref 1.0–2.5)
ALT: 15 U/L (ref 9–46)
AST: 23 U/L (ref 10–35)
Albumin: 3.9 g/dL (ref 3.6–5.1)
Alkaline phosphatase (APISO): 74 U/L (ref 35–144)
BUN: 15 mg/dL (ref 7–25)
CO2: 26 mmol/L (ref 20–32)
Calcium: 8.7 mg/dL (ref 8.6–10.3)
Chloride: 102 mmol/L (ref 98–110)
Creat: 0.98 mg/dL (ref 0.70–1.18)
GFR, Est African American: 85 mL/min/{1.73_m2} (ref >=60)
GFR, Est Non African American: 73 mL/min/{1.73_m2} (ref >=60)
Globulin: 2.8 g/dL (calc) (ref 1.9–3.7)
Glucose, Bld: 95 mg/dL (ref 65–99)
Potassium: 4.4 mmol/L (ref 3.5–5.3)
Sodium: 137 mmol/L (ref 135–146)
Total Bilirubin: 1 mg/dL (ref 0.2–1.2)
Total Protein: 6.7 g/dL (ref 6.1–8.1)

## 2019-12-04 LAB — CBC WITH DIFFERENTIAL/PLATELET
Absolute Monocytes: 1014 cells/uL — ABNORMAL HIGH (ref 200–950)
Basophils Absolute: 72 cells/uL (ref 0–200)
Basophils Relative: 1.1 %
Eosinophils Absolute: 345 cells/uL (ref 15–500)
Eosinophils Relative: 5.3 %
HCT: 42.4 % (ref 38.5–50.0)
Hemoglobin: 14.4 g/dL (ref 13.2–17.1)
Lymphs Abs: 2035 cells/uL (ref 850–3900)
MCH: 31.4 pg (ref 27.0–33.0)
MCHC: 34 g/dL (ref 32.0–36.0)
MCV: 92.4 fL (ref 80.0–100.0)
MPV: 10.7 fL (ref 7.5–12.5)
Monocytes Relative: 15.6 %
Neutro Abs: 3036 cells/uL (ref 1500–7800)
Neutrophils Relative %: 46.7 %
Platelets: 252 10*3/uL (ref 140–400)
RBC: 4.59 10*6/uL (ref 4.20–5.80)
RDW: 12.2 % (ref 11.0–15.0)
Total Lymphocyte: 31.3 %
WBC: 6.5 10*3/uL (ref 3.8–10.8)

## 2019-12-06 DIAGNOSIS — H353221 Exudative age-related macular degeneration, left eye, with active choroidal neovascularization: Secondary | ICD-10-CM | POA: Diagnosis not present

## 2019-12-09 DIAGNOSIS — G4733 Obstructive sleep apnea (adult) (pediatric): Secondary | ICD-10-CM | POA: Diagnosis not present

## 2019-12-10 ENCOUNTER — Other Ambulatory Visit: Payer: Self-pay

## 2019-12-10 ENCOUNTER — Ambulatory Visit (INDEPENDENT_AMBULATORY_CARE_PROVIDER_SITE_OTHER): Payer: PPO | Admitting: Family Medicine

## 2019-12-10 VITALS — BP 104/56 | HR 72 | Temp 97.1°F | Ht 71.0 in | Wt 201.0 lb

## 2019-12-10 DIAGNOSIS — T63461A Toxic effect of venom of wasps, accidental (unintentional), initial encounter: Secondary | ICD-10-CM

## 2019-12-10 MED ORDER — MOMETASONE FUROATE 0.1 % EX CREA: 1.0000 "application " | TOPICAL_CREAM | Freq: Two times a day (BID) | CUTANEOUS | 0 refills | Status: DC

## 2019-12-10 NOTE — Progress Notes (Signed)
Subjective:    Patient ID: Tyler Allison, male    DOB: 12-14-1940, 79 y.o.   MRN: [id number] Patient is a 79 year old Caucasian male who yesterday was digging in his bushes around the foundation of his house when he disturbed a nest of yellow jackets.  He was stung 8 times.  He has a large red welt on his medial lower right thigh.  He has a large red welt on his medial lower left thigh.  He has a similar welt on his left calf and also both forearms.  Each area is swollen and very itchy.  He denies any wheezing or trouble breathing.  He denies any angioedema or trouble swallowing.  He has been using Benadryl. Past Medical History:  Diagnosis Date  . Allergy   . Asthma   . Cervical spondylosis with radiculopathy    left c6 radiculopathy  . Chronic back pain   . GERD (gastroesophageal reflux disease)   . Hyperlipidemia   . Hypertension    Past Surgical History:  Procedure Laterality Date  . tympanoplasty with mastoidectomy Left 01/13/2004   Current Outpatient Medications on File Prior to Visit  Medication Sig Dispense Refill  . albuterol (PROAIR HFA) 108 (90 BASE) MCG/ACT inhaler Inhale 2 puffs into the lungs every 4 (four) hours as needed. 8.5 g 3  . atorvastatin (LIPITOR) 40 MG tablet TAKE 1 TABLET(40 MG) BY MOUTH DAILY 30 tablet 0  . clonazePAM (KLONOPIN) 0.5 MG tablet TAKE 1 TABLET(0.5 MG) BY MOUTH TWICE DAILY AS NEEDED FOR ANXIETY 60 tablet 1  . fluticasone (FLONASE) 50 MCG/ACT nasal spray Place 2 sprays into both nostrils as needed for allergies.  4  . metoprolol succinate (TOPROL-XL) 25 MG 24 hr tablet Take 1 tablet by mouth daily 90 tablet 2  . OXYGEN Inhale 2 L into the lungs at bedtime. Use at night when going to bed.    . pantoprazole (PROTONIX) 40 MG tablet TAKE 1 TABLET(40 MG) BY MOUTH TWICE DAILY 180 tablet 3  . PARoxetine (PAXIL) 20 MG tablet Take 1 tablet (20 mg total) by mouth daily. 30 tablet 2  . rivaroxaban (XARELTO) 20 MG TABS tablet Take 1 tablet by mouth daily with  supper 90 tablet 1  . tamsulosin (FLOMAX) 0.4 MG CAPS capsule Take 1 capsule (0.4 mg total) by mouth daily. 90 capsule 3  . traMADol (ULTRAM) 50 MG tablet Take by mouth every 6 (six) hours as needed.     No current facility-administered medications on file prior to visit.   Allergies  Allergen Reactions  . Codeine Nausea And Vomiting   Social History   Socioeconomic History  . Marital status: Married    Spouse name: Not on file  . Number of children: Not on file  . Years of education: Not on file  . Highest education level: Not on file  Occupational History  . Not on file  Tobacco Use  . Smoking status: Former Smoker    Packs/day: 0.50    Years: 13.00    Pack years: 6.50    Types: Cigarettes    Start date: 19    Quit date: 10/23/1968    Years since quitting: 51.1  . Smokeless tobacco: Never Used  Vaping Use  . Vaping Use: Never used  Substance and Sexual Activity  . Alcohol use: No  . Drug use: No  . Sexual activity: Not on file    Comment: married, retired.  Other Topics Concern  . Not on file  Social  History Narrative  . Not on file   Social Determinants of Health   Financial Resource Strain:   . Difficulty of Paying Living Expenses:   Food Insecurity:   . Worried About Charity fundraiser in the Last Year:   . Arboriculturist in the Last Year:   Transportation Needs:   . Film/video editor (Medical):   Marland Kitchen Lack of Transportation (Non-Medical):   Physical Activity:   . Days of Exercise per Week:   . Minutes of Exercise per Session:   Stress:   . Feeling of Stress :   Social Connections:   . Frequency of Communication with Friends and Family:   . Frequency of Social Gatherings with Friends and Family:   . Attends Religious Services:   . Active Member of Clubs or Organizations:   . Attends Archivist Meetings:   Marland Kitchen Marital Status:   Intimate Partner Violence:   . Fear of Current or Ex-Partner:   . Emotionally Abused:   Marland Kitchen Physically  Abused:   . Sexually Abused:        Review of Systems  All other systems reviewed and are negative.      Objective:   Vital signs are noted Physical exam was performed today.  Patient has a regular a regular heart rhythm.  He has bibasilar crackles left greater than right.  There is no pitting edema in extremities his extremities.  There is no JVD.  Abdomen is soft nondistended nontender with normal bowel sounds.  Patient has good air exchange.  There is no expiratory wheezing.  He has no rhinorrhea.  There is no sinus tenderness to palpation.  There is no mucosal edema.  Oropharynx is clear on examination.  There is no lymphadenopathy in the neck.  There is no stridor.  Trachea is midline.  There are large red welts as described in the history of present illness on both legs both calves both forearms.  Each is approximately 4 to 6 cm in diameter, erythematous, slightly indurated, and extremely itchy Assessment & Plan:   Yellow jacket sting, accidental or unintentional, initial encounter  I gave the patient the option of a prednisone taper pack.  He elects to continue Benadryl 25 mg every 4-6 hours coupled with Elocon cream applied 2-3 times to the affected areas.  No evidence of any life-threatening allergic reaction.  Symptoms should gradually improve over the next week

## 2019-12-11 ENCOUNTER — Telehealth: Payer: Self-pay | Admitting: Family Medicine

## 2019-12-11 NOTE — Telephone Encounter (Signed)
CB# [phone] Pt was seen for yellow jackets still taking Benadryl also using the Elocon cream Dr.Pickard offer him the Prednisone taper patch which he decline at the time but know he would like to have the Prednisone patch sent to  [place], [address] AT [place]

## 2019-12-12 DIAGNOSIS — J45909 Unspecified asthma, uncomplicated: Secondary | ICD-10-CM | POA: Diagnosis not present

## 2019-12-12 DIAGNOSIS — G4734 Idiopathic sleep related nonobstructive alveolar hypoventilation: Secondary | ICD-10-CM | POA: Diagnosis not present

## 2019-12-12 DIAGNOSIS — J984 Other disorders of lung: Secondary | ICD-10-CM | POA: Diagnosis not present

## 2019-12-12 DIAGNOSIS — R0602 Shortness of breath: Secondary | ICD-10-CM | POA: Diagnosis not present

## 2019-12-12 DIAGNOSIS — R05 Cough: Secondary | ICD-10-CM | POA: Diagnosis not present

## 2019-12-12 DIAGNOSIS — J4540 Moderate persistent asthma, uncomplicated: Secondary | ICD-10-CM | POA: Diagnosis not present

## 2019-12-16 NOTE — Telephone Encounter (Signed)
Call placed to patient to inquire.   Reports that cream has resolved areas of concern, so he no longer requires Pred Taper.

## 2019-12-17 DIAGNOSIS — H353211 Exudative age-related macular degeneration, right eye, with active choroidal neovascularization: Secondary | ICD-10-CM | POA: Diagnosis not present

## 2019-12-20 ENCOUNTER — Other Ambulatory Visit: Payer: Self-pay | Admitting: Family Medicine

## 2019-12-22 ENCOUNTER — Other Ambulatory Visit: Payer: Self-pay | Admitting: Family Medicine

## 2019-12-23 ENCOUNTER — Telehealth: Payer: Self-pay | Admitting: Internal Medicine

## 2019-12-23 DIAGNOSIS — G4734 Idiopathic sleep related nonobstructive alveolar hypoventilation: Secondary | ICD-10-CM

## 2019-12-23 NOTE — Telephone Encounter (Signed)
Recommendations from Dr. Annamaria Boots read to patient. DME order to discontinue oxygen sent to [facility].

## 2019-12-23 NOTE — Telephone Encounter (Signed)
Home sleep test showed mild sleep apnea, averaging about 12 apneas/ hour, with mild drops in blood oxygen level.  Conservative treatment might be sufficient- emphasizing weight loss and sleep off flat of back. Untreated sleep apnea increases the risk for Atrial Fibrillation. He can try off oxygen for now, but eventually he may be better off with CPAP or one of the other treatments for sleep apnea. We can discuss this at his next visit.

## 2019-12-23 NOTE — Telephone Encounter (Signed)
Patient calling to see if he can discontinue his oxygen. He reports his breathing has improved, his PCP says he is no longer in a-fib. He has a diagnosis of prostate cancer and is trying to save some money where he can. Please advise.

## 2020-01-15 DIAGNOSIS — H353221 Exudative age-related macular degeneration, left eye, with active choroidal neovascularization: Secondary | ICD-10-CM | POA: Diagnosis not present

## 2020-01-23 ENCOUNTER — Other Ambulatory Visit: Payer: Self-pay | Admitting: Family Medicine

## 2020-01-30 ENCOUNTER — Other Ambulatory Visit: Payer: Self-pay

## 2020-01-30 ENCOUNTER — Ambulatory Visit (INDEPENDENT_AMBULATORY_CARE_PROVIDER_SITE_OTHER): Payer: PPO

## 2020-01-30 DIAGNOSIS — Z23 Encounter for immunization: Secondary | ICD-10-CM | POA: Diagnosis not present

## 2020-02-07 ENCOUNTER — Other Ambulatory Visit: Payer: Self-pay

## 2020-02-07 ENCOUNTER — Encounter: Payer: Self-pay | Admitting: Internal Medicine

## 2020-02-07 ENCOUNTER — Ambulatory Visit (INDEPENDENT_AMBULATORY_CARE_PROVIDER_SITE_OTHER): Payer: PPO | Admitting: Internal Medicine

## 2020-02-07 DIAGNOSIS — G4734 Idiopathic sleep related nonobstructive alveolar hypoventilation: Secondary | ICD-10-CM

## 2020-02-07 DIAGNOSIS — J4540 Moderate persistent asthma, uncomplicated: Secondary | ICD-10-CM | POA: Diagnosis not present

## 2020-02-07 DIAGNOSIS — G4731 Primary central sleep apnea: Secondary | ICD-10-CM

## 2020-02-07 NOTE — Progress Notes (Signed)
HPI M former smoker followed for dyspnea, cough  Asthma, complicated by HBP, Hypercholesterolemia, PAFib, Lung nodule(2012), Cervical spondylosis, Dysphagia Labs- nl CMET, BNP and Hgb 08/28/2018 Echo 08/28/2018- EF 55-60%, no wall motion abnl or PH PFT 01/15/2019- Minimal obstruction, no resp to BD, Nl lung volumes, Nl Diffusion CTa chest 06/21/19- Aortic atherosclerosis, lungs clear 6MWT 10/30/19- 425 meters, lowest O2 sat on room air 95%, max HR 80 ------------------------------------------------------------------------   10/30/19- 78 yoM former smoker followed for dyspnea, cough, Nocturnal Hypoxemia,  Asthma, complicated by HBP, Hypercholesterolemia, PAFib, Lung nodule(2012), Cervical spondylosis, Dysphagia, Prostate Cancer, Proair, Flonase, Symbicort 160/ Anoro sample, Zyrtec, O2 2L sleep/ [facility] He finds O2 noisy and cumbersome trying to take his concentrator out of town, but does ssleep more restfully with it.  Reports DOE seems better now. Never noted response to Symbicort or Anoro. Rare wheeze he recognizes as asthma, does respond to albuterol hfa.  Snores "some". Had 2 Phizer Covax Dx'd with Prostate Cancer being followed with observation for now.  Cardiology follows PAFib. 6MWT 10/30/19- 425 meters, lowest O2 sat on room air 95%, max HR 80 CTa chest 07/05/19-  IMPRESSION: 1. No evidence of pulmonary embolism. No acute intrathoracic process. 2.  Aortic atherosclerosis (ICD10-I70.0).  02/07/20- 30 yoM former smoker followed for dyspnea, cough, hx Nocturnal Hypoxemia,  Sleep Apnea/ [place], Asthma, complicated by HBP, Hypercholesterolemia, PAFib, Lung nodule(2012), Cervical spondylosis, Dysphagia, Prostate Cancer, Proair, Flonase, Symbicort 160/ Anoro sample, Zyrtec,     O2 2L sleep/ [facility]>> was dc'd by [place] at his request when he reported no longer in AFib. HST 12/04/19- AHI 11.8/ hr MOSTLY CENTRAL APNEAS, desaturation to 85%/ average 93%, body weight 203 lbs Covid vax- 2 Phizer   Booster  next week Flu vax- done Body weight today- 203 lbs He breathes ok while sleeping. We are going to stay off O2 and recheck [place] on return . Still notes cough productive white., notat night, not related to food/ drink, mainly a nuisance.  He remains on Xarelto per cardiology.  ROS-see HPI  + = positive Constitutional:    weight loss, night sweats, fevers, chills, fatigue, lassitude. HEENT:    headaches, difficulty swallowing, tooth/dental problems, sore throat,       sneezing, itching, ear ache, nasal congestion, post nasal drip, snoring CV:    chest pain, orthopnea, PND, swelling in lower extremities, anasarca,                                   dizziness, palpitations Resp:   +shortness of breath with exertion or at rest.                +productive cough,   +non-productive cough, coughing up of blood.              change in color of mucus.  wheezing.   Skin:    rash or lesions. GI:  No-   heartburn, indigestion, abdominal pain, nausea, vomiting, diarrhea,                 change in bowel habits, loss of appetite GU: dysuria, change in color of urine, no urgency or frequency.   flank pain. MS:   joint pain, stiffness, decreased range of motion, back pain. Neuro-     nothing unusual Psych:  change in mood or affect.  depression or anxiety.   memory loss.  OBJ- Physical Exam General- Alert, Oriented, Affect-appropriate, Distress- none acute, trim  Skin- rash-none, lesions- none, excoriation- none Lymphadenopathy- none Head- atraumatic            Eyes- Gross vision intact, PERRLA, conjunctivae and secretions clear            Ears- Hearing, canals-normal            Nose- Clear, no-Septal dev, mucus, polyps, erosion, perforation             Throat- Mallampati II , mucosa clear , drainage- none, tonsils- atrophic,  Neck- flexible , trachea midline, no stridor , thyroid nl, carotid no bruit Chest - symmetrical excursion , unlabored           Heart/CV- RRR faint , no murmur , no gallop  , no  rub, nl s1 s2                           - JVD- none , edema- none, stasis changes- none, varices- none           Lung- +clear, wheeze- none, cough- none , dullness-none, rub- none           Chest wall-  Abd-  Br/ Gen/ Rectal- Not done, not indicated Extrem- cyanosis- none, clubbing, none, atrophy- none, strength- nl Neuro- grossly intact to observation

## 2020-02-07 NOTE — Assessment & Plan Note (Signed)
Mild persistent cough. CT chest unremarkable 07/05/19. Doesn't seem to relatee to lung water/ cardiac issue.  May not be good candidate for a LAMA med. We can consider steroid inhaler if this gets worse.

## 2020-02-07 NOTE — Patient Instructions (Signed)
We plan to get you back in 4 months and will recheck your night time oxygen level then.  Please call if we can help

## 2020-02-07 NOTE — Assessment & Plan Note (Signed)
Mild, not disturbing him. Not associated with significant desaturation at this point. Plan- Follow.

## 2020-02-07 NOTE — Assessment & Plan Note (Signed)
Doesn't look like medically important desaturation. Plan-  Room air for now. Recheck ONOX at next visit

## 2020-02-11 DIAGNOSIS — H353211 Exudative age-related macular degeneration, right eye, with active choroidal neovascularization: Secondary | ICD-10-CM | POA: Diagnosis not present

## 2020-02-19 DIAGNOSIS — H353221 Exudative age-related macular degeneration, left eye, with active choroidal neovascularization: Secondary | ICD-10-CM | POA: Diagnosis not present

## 2020-02-26 DIAGNOSIS — Z85828 Personal history of other malignant neoplasm of skin: Secondary | ICD-10-CM | POA: Diagnosis not present

## 2020-02-26 DIAGNOSIS — D225 Melanocytic nevi of trunk: Secondary | ICD-10-CM | POA: Diagnosis not present

## 2020-02-26 DIAGNOSIS — L814 Other melanin hyperpigmentation: Secondary | ICD-10-CM | POA: Diagnosis not present

## 2020-02-26 DIAGNOSIS — D1801 Hemangioma of skin and subcutaneous tissue: Secondary | ICD-10-CM | POA: Diagnosis not present

## 2020-02-26 DIAGNOSIS — D692 Other nonthrombocytopenic purpura: Secondary | ICD-10-CM | POA: Diagnosis not present

## 2020-02-26 DIAGNOSIS — L570 Actinic keratosis: Secondary | ICD-10-CM | POA: Diagnosis not present

## 2020-02-26 DIAGNOSIS — L821 Other seborrheic keratosis: Secondary | ICD-10-CM | POA: Diagnosis not present

## 2020-03-04 DIAGNOSIS — H53002 Unspecified amblyopia, left eye: Secondary | ICD-10-CM | POA: Diagnosis not present

## 2020-03-04 DIAGNOSIS — H52203 Unspecified astigmatism, bilateral: Secondary | ICD-10-CM | POA: Diagnosis not present

## 2020-03-04 DIAGNOSIS — H353230 Exudative age-related macular degeneration, bilateral, stage unspecified: Secondary | ICD-10-CM | POA: Diagnosis not present

## 2020-03-04 DIAGNOSIS — H43813 Vitreous degeneration, bilateral: Secondary | ICD-10-CM | POA: Diagnosis not present

## 2020-03-13 ENCOUNTER — Other Ambulatory Visit: Payer: Self-pay

## 2020-03-13 MED ORDER — METOPROLOL SUCCINATE ER 25 MG PO TB24: ORAL_TABLET | ORAL | 2 refills | Status: DC

## 2020-03-20 ENCOUNTER — Other Ambulatory Visit: Payer: Self-pay | Admitting: Family Medicine

## 2020-03-23 ENCOUNTER — Other Ambulatory Visit: Payer: Self-pay

## 2020-03-23 ENCOUNTER — Ambulatory Visit (INDEPENDENT_AMBULATORY_CARE_PROVIDER_SITE_OTHER): Payer: PPO | Admitting: Family Medicine

## 2020-03-23 VITALS — BP 108/70 | HR 50 | Temp 97.9°F | Ht 70.0 in | Wt 204.0 lb

## 2020-03-23 DIAGNOSIS — M4722 Other spondylosis with radiculopathy, cervical region: Secondary | ICD-10-CM | POA: Diagnosis not present

## 2020-03-23 DIAGNOSIS — Z0000 Encounter for general adult medical examination without abnormal findings: Secondary | ICD-10-CM | POA: Diagnosis not present

## 2020-03-23 DIAGNOSIS — I480 Paroxysmal atrial fibrillation: Secondary | ICD-10-CM | POA: Diagnosis not present

## 2020-03-23 DIAGNOSIS — E782 Mixed hyperlipidemia: Secondary | ICD-10-CM

## 2020-03-23 MED ORDER — PREDNISONE 20 MG PO TABS: ORAL_TABLET | ORAL | 0 refills | Status: DC

## 2020-03-23 MED ORDER — RIVAROXABAN 20 MG PO TABS: ORAL_TABLET | ORAL | 1 refills | Status: DC

## 2020-03-23 NOTE — Progress Notes (Signed)
Subjective:    Patient ID: Tyler Allison, male    DOB: 1940/09/26, 79 y.o.   MRN: [id number]  HPI Subjective:   Patient presents for Medicare Annual/Subsequent preventive examination.  He is currently seeing urology to manage his PSA.  Due to the patient's age, he does not require colonoscopy.  His immunizations are listed below: Immunization History  Administered Date(s) Administered  . Fluad Quad(high Dose 65+) 12/28/2018, 01/30/2020  . Influenza Whole 02/09/2010  . Influenza, High Dose Seasonal PF 02/15/2017, 02/01/2018  . Influenza,inj,Quad PF,6+ Mos 02/13/2013, 02/25/2014, 03/13/2015, 02/11/2016  . Influenza,inj,quad, With Preservative 02/06/2018  . PFIZER SARS-COV-2 Vaccination 06/20/2019, 07/13/2019  . Pneumococcal Conjugate-13 03/14/2014  . Pneumococcal Polysaccharide-23 05/09/2004, 12/05/2017  . Td 01/07/2005  . Zoster 08/08/2006   Patient shots are up-to-date except for a booster on his shingles vaccine/Shingrix as well as a tetanus shot.  Patient declines both of these at the present time although he will consider the shingles vaccine in the spring.  His flu shot and Covid shot are up-to-date.  He denies any falls, memory loss, or depression.  He does complain of pain in his neck radiating down his right arm.  He has a history of a bulging disc in his cervical spine that has required two separate cortisone injections over the last 10 years.  This pain feels exactly the same.  The pain is causing him to have dull headaches and constant pain in his neck over the last 2 weeks.  He does report neuropathic type pain radiating down his right arm Review Past Medical/Family/Social: Past Medical History:  Diagnosis Date  . Allergy   . Asthma   . Cervical spondylosis with radiculopathy    left c6 radiculopathy  . Chronic back pain   . GERD (gastroesophageal reflux disease)   . Hyperlipidemia   . Hypertension    Past Surgical History:  Procedure Laterality Date  . tympanoplasty  with mastoidectomy Left 01/13/2004   Current Outpatient Medications on File Prior to Visit  Medication Sig Dispense Refill  . albuterol (PROAIR HFA) 108 (90 BASE) MCG/ACT inhaler Inhale 2 puffs into the lungs every 4 (four) hours as needed. 8.5 g 3  . atorvastatin (LIPITOR) 40 MG tablet TAKE 1 TABLET(40 MG) BY MOUTH DAILY 90 tablet 1  . clonazePAM (KLONOPIN) 0.5 MG tablet TAKE 1 TABLET(0.5 MG) BY MOUTH TWICE DAILY AS NEEDED FOR ANXIETY 60 tablet 1  . fluticasone (FLONASE) 50 MCG/ACT nasal spray Place 2 sprays into both nostrils as needed for allergies.  4  . metoprolol succinate (TOPROL-XL) 25 MG 24 hr tablet Take 1 tablet by mouth daily 90 tablet 2  . mometasone (ELOCON) 0.1 % cream Apply 1 application topically in the morning and at bedtime. 45 g 0  . OXYGEN Inhale 2 L into the lungs at bedtime. Use at night when going to bed.     . pantoprazole (PROTONIX) 40 MG tablet TAKE 1 TABLET(40 MG) BY MOUTH TWICE DAILY 180 tablet 3  . tamsulosin (FLOMAX) 0.4 MG CAPS capsule Take 1 capsule (0.4 mg total) by mouth daily. 90 capsule 3  . traMADol (ULTRAM) 50 MG tablet Take by mouth every 6 (six) hours as needed.    Marland Kitchen PARoxetine (PAXIL) 20 MG tablet Take 1 tablet (20 mg total) by mouth daily. (Patient not taking: Reported on 03/23/2020) 30 tablet 2   No current facility-administered medications on file prior to visit.   Allergies  Allergen Reactions  . Codeine Nausea And Vomiting   Social  History   Socioeconomic History  . Marital status: Married    Spouse name: Not on file  . Number of children: Not on file  . Years of education: Not on file  . Highest education level: Not on file  Occupational History  . Not on file  Tobacco Use  . Smoking status: Former Smoker    Packs/day: 0.50    Years: 13.00    Pack years: 6.50    Types: Cigarettes    Start date: 74    Quit date: 10/23/1968    Years since quitting: 51.4  . Smokeless tobacco: Never Used  Vaping Use  . Vaping Use: Never used    Substance and Sexual Activity  . Alcohol use: No  . Drug use: No  . Sexual activity: Not on file    Comment: married, retired.  Other Topics Concern  . Not on file  Social History Narrative  . Not on file   Social Determinants of Health   Financial Resource Strain:   . Difficulty of Paying Living Expenses: Not on file  Food Insecurity:   . Worried About Charity fundraiser in the Last Year: Not on file  . Ran Out of Food in the Last Year: Not on file  Transportation Needs:   . Lack of Transportation (Medical): Not on file  . Lack of Transportation (Non-Medical): Not on file  Physical Activity:   . Days of Exercise per Week: Not on file  . Minutes of Exercise per Session: Not on file  Stress:   . Feeling of Stress : Not on file  Social Connections:   . Frequency of Communication with Friends and Family: Not on file  . Frequency of Social Gatherings with Friends and Family: Not on file  . Attends Religious Services: Not on file  . Active Member of Clubs or Organizations: Not on file  . Attends Archivist Meetings: Not on file  . Marital Status: Not on file  Intimate Partner Violence:   . Fear of Current or Ex-Partner: Not on file  . Emotionally Abused: Not on file  . Physically Abused: Not on file  . Sexually Abused: Not on file   Family History  Problem Relation Age of Onset  . Heart failure Mother   . Heart attack Maternal Uncle     Depression Screen  (Note: if answer to either of the following is "Yes", a more complete depression screening is indicated)  Over the past two weeks, have you felt down, depressed or hopeless? No Over the past two weeks, have you felt little interest or pleasure in doing things? No Have you lost interest or pleasure in daily life? No Do you often feel hopeless? No Do you cry easily over simple problems? No   Activities of Daily Living  In your present state of health, do you have any difficulty performing the following  activities?:  Driving? No  Managing money? No  Feeding yourself? No  Getting from bed to chair? No  Climbing a flight of stairs? No  Preparing food and eating?: No  Bathing or showering? No  Getting dressed: No  Getting to the toilet? No  Using the toilet:No  Moving around from place to place: No  In the past year have you fallen or had a near fall?:No  Are you sexually active? No  Do you have more than one partner? No   Hearing Difficulties: No  Do you often ask people to speak up or repeat  themselves? No  Do you experience ringing or noises in your ears? No Do you have difficulty understanding soft or whispered voices? No  Do you feel that you have a problem with memory? No Do you often misplace items? No  Do you feel safe at home? Yes  Cognitive Testing  Alert? Yes Normal Appearance?Yes  Oriented to person? Yes Place? Yes  Time? Yes  Recall of three objects? Yes  Can perform simple calculations? Yes  Displays appropriate judgment?Yes  Can read the correct time from a watch face?Yes  Review of Systems  All other systems reviewed and are negative.      Objective:   Physical Exam Vitals reviewed.  Constitutional:      General: He is not in acute distress.    Appearance: He is well-developed. He is not diaphoretic.  HENT:     Head: Normocephalic and atraumatic.     Right Ear: External ear normal.     Left Ear: External ear normal.     Nose: Nose normal.     Mouth/Throat:     Pharynx: No oropharyngeal exudate.  Eyes:     General: No scleral icterus.       Right eye: No discharge.        Left eye: No discharge.     Conjunctiva/sclera: Conjunctivae normal.     Pupils: Pupils are equal, round, and reactive to light.  Neck:     Thyroid: No thyromegaly.     Vascular: No JVD.     Trachea: No tracheal deviation.  Cardiovascular:     Rate and Rhythm: Normal rate and regular rhythm.     Heart sounds: Normal heart sounds. No murmur heard.  No friction rub. No  gallop.   Pulmonary:     Effort: Pulmonary effort is normal. No respiratory distress.     Breath sounds: Normal breath sounds. No stridor. No wheezing or rales.  Chest:     Chest wall: No tenderness.  Abdominal:     General: Bowel sounds are normal. There is no distension.     Palpations: Abdomen is soft. There is no mass.     Tenderness: There is no abdominal tenderness. There is no guarding or rebound.  Musculoskeletal:     Cervical back: Neck supple. Tenderness present. No erythema, spasms or bony tenderness. Pain with movement present. Decreased range of motion.  Lymphadenopathy:     Cervical: No cervical adenopathy.  Skin:    General: Skin is warm.     Coloration: Skin is not pale.     Findings: No erythema or rash.  Neurological:     Mental Status: He is alert and oriented to person, place, and time.     Cranial Nerves: No cranial nerve deficit.     Motor: No abnormal muscle tone.     Coordination: Coordination normal.     Deep Tendon Reflexes: Reflexes are normal and symmetric.  Psychiatric:        Behavior: Behavior normal.        Thought Content: Thought content normal.        Judgment: Judgment normal.         Assessment & Plan:  Paroxysmal atrial fibrillation (HCC)  Mixed hyperlipidemia - Plan: CBC with Differential/Platelet, COMPLETE METABOLIC PANEL WITH GFR, Lipid panel  Cervical spondylosis with radiculopathy  General medical exam  Recommended the shingles vaccine in the spring.  Offered a tetanus shot.  Patient defers both.  Colonoscopy is not recommended due to age.  Prostate cancer screening will be deferred to his urologist but I would recommend against asymptomatic screening due to his age.  Depression is currently well controlled.  He denies any symptoms of depression or falls or memory loss.  I will check a CBC, CMP, fasting lipid panel.  I will also give the patient a prednisone taper pack for what I believed to be cervical radiculopathy.  Regular  anticipatory guidance is provided.

## 2020-03-24 LAB — COMPLETE METABOLIC PANEL WITH GFR
AG Ratio: 1.3 (calc) (ref 1.0–2.5)
ALT: 15 U/L (ref 9–46)
AST: 23 U/L (ref 10–35)
Albumin: 3.9 g/dL (ref 3.6–5.1)
Alkaline phosphatase (APISO): 57 U/L (ref 35–144)
BUN: 17 mg/dL (ref 7–25)
CO2: 26 mmol/L (ref 20–32)
Calcium: 9.1 mg/dL (ref 8.6–10.3)
Chloride: 102 mmol/L (ref 98–110)
Creat: 0.97 mg/dL (ref 0.70–1.11)
GFR, Est African American: 85 mL/min/{1.73_m2} (ref >=60)
GFR, Est Non African American: 73 mL/min/{1.73_m2} (ref >=60)
Globulin: 2.9 g/dL (calc) (ref 1.9–3.7)
Glucose, Bld: 83 mg/dL (ref 65–99)
Potassium: 4.5 mmol/L (ref 3.5–5.3)
Sodium: 137 mmol/L (ref 135–146)
Total Bilirubin: 0.6 mg/dL (ref 0.2–1.2)
Total Protein: 6.8 g/dL (ref 6.1–8.1)

## 2020-03-24 LAB — CBC WITH DIFFERENTIAL/PLATELET
Absolute Monocytes: 1109 cells/uL — ABNORMAL HIGH (ref 200–950)
Basophils Absolute: 72 cells/uL (ref 0–200)
Basophils Relative: 1 %
Eosinophils Absolute: 338 cells/uL (ref 15–500)
Eosinophils Relative: 4.7 %
HCT: 42.8 % (ref 38.5–50.0)
Hemoglobin: 14.6 g/dL (ref 13.2–17.1)
Lymphs Abs: 2038 cells/uL (ref 850–3900)
MCH: 31.5 pg (ref 27.0–33.0)
MCHC: 34.1 g/dL (ref 32.0–36.0)
MCV: 92.2 fL (ref 80.0–100.0)
MPV: 10.9 fL (ref 7.5–12.5)
Monocytes Relative: 15.4 %
Neutro Abs: 3643 cells/uL (ref 1500–7800)
Neutrophils Relative %: 50.6 %
Platelets: 231 10*3/uL (ref 140–400)
RBC: 4.64 10*6/uL (ref 4.20–5.80)
RDW: 11.8 % (ref 11.0–15.0)
Total Lymphocyte: 28.3 %
WBC: 7.2 10*3/uL (ref 3.8–10.8)

## 2020-03-24 LAB — LIPID PANEL
Cholesterol: 141 mg/dL (ref <200)
HDL: 51 mg/dL (ref >=40)
LDL Cholesterol (Calc): 69 mg/dL (calc)
Non-HDL Cholesterol (Calc): 90 mg/dL (calc) (ref <130)
Total CHOL/HDL Ratio: 2.8 (calc) (ref <5.0)
Triglycerides: 120 mg/dL (ref <150)

## 2020-03-25 DIAGNOSIS — H353221 Exudative age-related macular degeneration, left eye, with active choroidal neovascularization: Secondary | ICD-10-CM | POA: Diagnosis not present

## 2020-03-26 ENCOUNTER — Ambulatory Visit (INDEPENDENT_AMBULATORY_CARE_PROVIDER_SITE_OTHER): Payer: PPO | Admitting: Otolaryngology

## 2020-03-26 ENCOUNTER — Encounter (INDEPENDENT_AMBULATORY_CARE_PROVIDER_SITE_OTHER): Payer: Self-pay | Admitting: Otolaryngology

## 2020-03-26 ENCOUNTER — Other Ambulatory Visit: Payer: Self-pay

## 2020-03-26 VITALS — Temp 97.2°F

## 2020-03-26 DIAGNOSIS — H6123 Impacted cerumen, bilateral: Secondary | ICD-10-CM

## 2020-03-26 DIAGNOSIS — H6982 Other specified disorders of Eustachian tube, left ear: Secondary | ICD-10-CM | POA: Diagnosis not present

## 2020-03-26 NOTE — Progress Notes (Signed)
HPI: Tyler Allison is a 79 y.o. male who returns today for evaluation of wax buildup in his ears. He was last seen in November of last year.. He has had a previous left tympanoplasty with mastoidectomy in 2005. On previous evaluation has had a chronic left TM retraction pocket and conductive hearing loss.  Past Medical History:  Diagnosis Date  . Allergy   . Asthma   . Cervical spondylosis with radiculopathy    left c6 radiculopathy  . Chronic back pain   . GERD (gastroesophageal reflux disease)   . Hyperlipidemia   . Hypertension    Past Surgical History:  Procedure Laterality Date  . tympanoplasty with mastoidectomy Left 01/13/2004   Social History   Socioeconomic History  . Marital status: Married    Spouse name: Not on file  . Number of children: Not on file  . Years of education: Not on file  . Highest education level: Not on file  Occupational History  . Not on file  Tobacco Use  . Smoking status: Former Smoker    Packs/day: 0.50    Years: 13.00    Pack years: 6.50    Types: Cigarettes    Start date: 71    Quit date: 10/23/1968    Years since quitting: 51.4  . Smokeless tobacco: Never Used  Vaping Use  . Vaping Use: Never used  Substance and Sexual Activity  . Alcohol use: No  . Drug use: No  . Sexual activity: Not on file    Comment: married, retired.  Other Topics Concern  . Not on file  Social History Narrative  . Not on file   Social Determinants of Health   Financial Resource Strain:   . Difficulty of Paying Living Expenses: Not on file  Food Insecurity:   . Worried About Charity fundraiser in the Last Year: Not on file  . Ran Out of Food in the Last Year: Not on file  Transportation Needs:   . Lack of Transportation (Medical): Not on file  . Lack of Transportation (Non-Medical): Not on file  Physical Activity:   . Days of Exercise per Week: Not on file  . Minutes of Exercise per Session: Not on file  Stress:   . Feeling of Stress : Not on  file  Social Connections:   . Frequency of Communication with Friends and Family: Not on file  . Frequency of Social Gatherings with Friends and Family: Not on file  . Attends Religious Services: Not on file  . Active Member of Clubs or Organizations: Not on file  . Attends Archivist Meetings: Not on file  . Marital Status: Not on file   Family History  Problem Relation Age of Onset  . Heart failure Mother   . Heart attack Maternal Uncle    Allergies  Allergen Reactions  . Codeine Nausea And Vomiting   Prior to Admission medications   Medication Sig Start Date End Date Taking? Authorizing Provider  albuterol (PROAIR HFA) 108 (90 BASE) MCG/ACT inhaler Inhale 2 puffs into the lungs every 4 (four) hours as needed. 10/31/12  Yes [name], MD  atorvastatin (LIPITOR) 40 MG tablet TAKE 1 TABLET(40 MG) BY MOUTH DAILY 03/20/20  Yes [name], MD  clonazePAM (KLONOPIN) 0.5 MG tablet TAKE 1 TABLET(0.5 MG) BY MOUTH TWICE DAILY AS NEEDED FOR ANXIETY 10/25/19  Yes [name], MD  fluticasone (FLONASE) 50 MCG/ACT nasal spray Place 2 sprays into both nostrils as needed  for allergies. 04/19/17  Yes [provider]  metoprolol succinate (TOPROL-XL) 25 MG 24 hr tablet Take 1 tablet by mouth daily 03/13/20  Yes Pickard, Cammie Mcgee, MD  mometasone (ELOCON) 0.1 % cream Apply 1 application topically in the morning and at bedtime. 12/10/19  Yes [name], MD  OXYGEN Inhale 2 L into the lungs at bedtime. Use at night when going to bed.    Yes [provider]  pantoprazole (PROTONIX) 40 MG tablet TAKE 1 TABLET(40 MG) BY MOUTH TWICE DAILY 01/23/20  Yes [name], MD  PARoxetine (PAXIL) 20 MG tablet Take 1 tablet (20 mg total) by mouth daily. 12/03/19  Yes [name], MD  predniSONE (DELTASONE) 20 MG tablet 3 tabs poqday 1-2, 2 tabs poqday 3-4, 1 tab poqday 5-6 03/23/20  Yes Pickard, Cammie Mcgee, MD  rivaroxaban (XARELTO) 20 MG TABS tablet Take 1  tablet by mouth daily with supper 03/23/20  Yes [name], MD  tamsulosin (FLOMAX) 0.4 MG CAPS capsule Take 1 capsule (0.4 mg total) by mouth daily. 04/19/17  Yes [name], MD  traMADol (ULTRAM) 50 MG tablet Take by mouth every 6 (six) hours as needed.   Yes [provider]     Positive ROS: Otherwise negative  All other systems have been reviewed and were otherwise negative with the exception of those mentioned in the HPI and as above.  Physical Exam: Constitutional: Alert, well-appearing, no acute distress Ears: External ears without lesions or tenderness. He has large amount of wax buildup on the left side minimal amount of wax buildup on the right side that was cleaned in the office. The left TM reveals a large retraction pocket or perforation. This is dry with no evidence of cholesteatoma or infection. Nasal: External nose without lesions.. Clear nasal passages Oral: Lips and gums without lesions. Tongue and palate mucosa without lesions. Posterior oropharynx clear. Neck: No palpable adenopathy or masses Respiratory: Breathing comfortably  Skin: No facial/neck lesions or rash noted.  Cerumen impaction removal  Date/Time: 03/26/2020 4:46 PM Performed by: Rozetta Nunnery, MD Authorized by: Rozetta Nunnery, MD   Consent:    Consent obtained:  Verbal   Consent given by:  Patient   Risks discussed:  Pain and bleeding Procedure details:    Location:  L ear and R ear Post-procedure details:    Inspection:  TM intact and canal normal   Hearing quality:  Improved   Patient tolerance of procedure:  Tolerated well, no immediate complications Comments:     Had large amount of wax buildup in the left ear that was cleaned in the office. He has either a central perforation or a large retraction pocket but no evidence of active infection. He had a small amount of wax on the right side that was cleaned with a curette. Right TM is  clear.    Assessment: Excessive wax buildup on the left side that was cleaned in the office. Patient with either a chronic left TM perforation or completely atelectatic central left TM but no signs of infection.  Plan: Recommend keeping water out of the left ear and will follow up in 6 months for recheck and cleaning. He will return earlier if he has any problems with the ear.   Radene Journey, MD

## 2020-03-31 DIAGNOSIS — Z7901 Long term (current) use of anticoagulants: Secondary | ICD-10-CM | POA: Diagnosis not present

## 2020-03-31 DIAGNOSIS — M5412 Radiculopathy, cervical region: Secondary | ICD-10-CM | POA: Diagnosis not present

## 2020-04-07 DIAGNOSIS — H353211 Exudative age-related macular degeneration, right eye, with active choroidal neovascularization: Secondary | ICD-10-CM | POA: Diagnosis not present

## 2020-04-22 ENCOUNTER — Other Ambulatory Visit: Payer: Self-pay | Admitting: Urology

## 2020-04-22 DIAGNOSIS — C61 Malignant neoplasm of prostate: Secondary | ICD-10-CM

## 2020-05-04 DIAGNOSIS — H353221 Exudative age-related macular degeneration, left eye, with active choroidal neovascularization: Secondary | ICD-10-CM | POA: Diagnosis not present

## 2020-05-07 DIAGNOSIS — C61 Malignant neoplasm of prostate: Secondary | ICD-10-CM | POA: Diagnosis not present

## 2020-05-15 DIAGNOSIS — C61 Malignant neoplasm of prostate: Secondary | ICD-10-CM | POA: Diagnosis not present

## 2020-05-19 ENCOUNTER — Ambulatory Visit
Admission: RE | Admit: 2020-05-19 | Discharge: 2020-05-19 | Disposition: A | Discharge status: Home / Self Care | Payer: PPO | Source: Ambulatory Visit | Attending: Urology | Admitting: Urology

## 2020-05-19 DIAGNOSIS — C61 Malignant neoplasm of prostate: Secondary | ICD-10-CM

## 2020-05-19 DIAGNOSIS — N400 Enlarged prostate without lower urinary tract symptoms: Secondary | ICD-10-CM | POA: Diagnosis not present

## 2020-05-19 DIAGNOSIS — R590 Localized enlarged lymph nodes: Secondary | ICD-10-CM | POA: Diagnosis not present

## 2020-05-19 MED ORDER — GADOBENATE DIMEGLUMINE 529 MG/ML IV SOLN
18.0000 mL | Freq: Once | INTRAVENOUS | Status: AC | PRN
  Administered 2020-05-19: 18 mL via INTRAVENOUS

## 2020-05-27 ENCOUNTER — Telehealth: Payer: Self-pay | Admitting: *Deleted

## 2020-05-27 NOTE — Telephone Encounter (Signed)
   [facility] Pre-operative Risk Assessment    HEARTCARE STAFF: - Please ensure there is not already an duplicate clearance open for this procedure. - Under Visit Info/Reason for Call, type in Other and utilize the format Clearance MM/DD/YY or Clearance TBD. Do not use dashes or single digits. - If request is for dental extraction, please clarify the # of teeth to be extracted.  Request for surgical clearance:  1. What type of surgery is being performed? MR/ULTRASOUND FUSION PROSTATE Bx   2. When is this surgery scheduled? 07/06/20   3. What type of clearance is required (medical clearance vs. Pharmacy clearance to hold med vs. Both)? BOTH  4. Are there any medications that need to be held prior to surgery and how long? [place]   5. Practice name and name of physician performing surgery? [facility]; DR. Alinda Money   6. What is the office phone number? [phone]   7.   What is the office fax number? [phone]  8.   Anesthesia type (None, local, MAC, general) ? NONE LISTED   Tyler Allison 05/27/2020, 9:51 AM  _________________________________________________________________   (provider comments below)

## 2020-05-28 NOTE — Telephone Encounter (Signed)
   Primary Cardiologist: Larae Grooms, MD  Chart reviewed as part of pre-operative protocol coverage. Patient was contacted 05/28/2020 in reference to pre-operative risk assessment for pending surgery as outlined below.  Tyler Allison was last seen on 08/19/19 by Dr. Irish Lack.  Since that day, Tyler Allison has done well. Reports no chest pain, pressure, tightness. Reports improvement in his breathing and exercise tolerance of >4 METS. [name], [name], the patient would be at acceptable risk for the planned procedure without further cardiovascular testing.   Per pharmacy review and office protocols he may hold Xarelto 2-3 days prior to the planned procedure. He verbalized understanding of these directions.   The patient was advised that if he develops new symptoms prior to surgery to contact our office to arrange for a follow-up visit, and he verbalized understanding.  I will route this recommendation to the requesting party via Epic fax function and remove from pre-op pool. Please call with questions.  Loel Dubonnet, NP 05/28/2020, 10:02 AM

## 2020-05-28 NOTE — Telephone Encounter (Signed)
Patient with diagnosis of afib on Xarelto for anticoagulation.    Procedure: MR/ULTRASOUND FUSION PROSTATE Bx   Date of procedure: 07/06/20  CHA2DS2-VASc Score = 4  This indicates a 4.8% annual risk of stroke. The patient's score is based upon: CHF History: No HTN History: Yes Diabetes History: No Stroke History: No Vascular Disease History: Yes Age Score: 2 Gender Score: 0  CrCl 10mL/min Platelet count 231K  Per office protocol, patient can hold Xarelto for 2-3 days prior to procedure.

## 2020-06-02 DIAGNOSIS — H353211 Exudative age-related macular degeneration, right eye, with active choroidal neovascularization: Secondary | ICD-10-CM | POA: Diagnosis not present

## 2020-06-05 ENCOUNTER — Other Ambulatory Visit (INDEPENDENT_AMBULATORY_CARE_PROVIDER_SITE_OTHER): Payer: Self-pay | Admitting: Otolaryngology

## 2020-06-08 ENCOUNTER — Telehealth (INDEPENDENT_AMBULATORY_CARE_PROVIDER_SITE_OTHER): Payer: PPO | Admitting: Nurse Practitioner

## 2020-06-08 ENCOUNTER — Other Ambulatory Visit: Payer: Self-pay

## 2020-06-08 DIAGNOSIS — Z20822 Contact with and (suspected) exposure to covid-19: Secondary | ICD-10-CM

## 2020-06-08 NOTE — Progress Notes (Signed)
Subjective:    Patient ID: Tyler Allison, male    DOB: [birth year], 80 y.o.   MRN: [id number]  HPI: Tyler Allison is a 80 y.o. male presenting virtually for COVID exposure.  Chief Complaint  Patient presents with  . other    Wife has covid, wants to know if he needs testing. Has no sx at all  . Covid Exposure   UPPER RESPIRATORY TRACT INFECTION [facility] Vaccination status: received both shots and booster Fever: no Cough: no Shortness of breath: no Wheezing: no Chest pain: no Chest tightness: no Chest congestion: no Nasal congestion: no Runny nose: no Post nasal drip: no Sneezing: no Sore throat: yes last night only; thinks due to talking so much  Swollen glands: no Sinus pressure: no Headache: no Face pain: no Toothache: no Ear pain: no  Ear pressure: no  Eyes red/itching:no Eye drainage/crusting: no  Nausea: no  Vomiting: no Diarrhea: no  Change in appetite: no  Loss of taste/smell: no  Rash: no Fatigue: no Sick contacts: yes; wife tested positive incidentally for COVID-19 after being admitted to hospital for fall Strep contacts: no  Context: stable Recurrent sinusitis: no Treatments attempted: warm salt water Relief with OTC medications:  Allergies  Allergen Reactions  . Codeine Nausea And Vomiting    Outpatient Encounter Medications as of 06/08/2020  Medication Sig  . albuterol (PROAIR HFA) 108 (90 BASE) MCG/ACT inhaler Inhale 2 puffs into the lungs every 4 (four) hours as needed.  Marland Kitchen atorvastatin (LIPITOR) 40 MG tablet TAKE 1 TABLET(40 MG) BY MOUTH DAILY  . clonazePAM (KLONOPIN) 0.5 MG tablet TAKE 1 TABLET(0.5 MG) BY MOUTH TWICE DAILY AS NEEDED FOR ANXIETY  . fluticasone (FLONASE) 50 MCG/ACT nasal spray Place 2 sprays into both nostrils as needed for allergies.  . metoprolol succinate (TOPROL-XL) 25 MG 24 hr tablet Take 1 tablet by mouth daily  . mometasone (ELOCON) 0.1 % cream Apply 1 application topically  in the morning and at bedtime.  . OXYGEN Inhale 2 L into the lungs at bedtime. Use at night when going to bed.   . pantoprazole (PROTONIX) 40 MG tablet TAKE 1 TABLET(40 MG) BY MOUTH TWICE DAILY  . PARoxetine (PAXIL) 20 MG tablet Take 1 tablet (20 mg total) by mouth daily.  . rivaroxaban (XARELTO) 20 MG TABS tablet Take 1 tablet by mouth daily with supper  . tamsulosin (FLOMAX) 0.4 MG CAPS capsule Take 1 capsule (0.4 mg total) by mouth daily.  . traMADol (ULTRAM) 50 MG tablet Take by mouth every 6 (six) hours as needed.  . [DISCONTINUED] predniSONE (DELTASONE) 20 MG tablet 3 tabs poqday 1-2, 2 tabs poqday 3-4, 1 tab poqday 5-6   No facility-administered encounter medications on file as of 06/08/2020.    Patient Active Problem List   Diagnosis Date Noted  . Close exposure to COVID-19 virus 06/08/2020  . Central sleep apnea 02/07/2020  . Nocturnal hypoxemia 03/01/2019  . GERD (gastroesophageal reflux disease) 10/24/2018  . Paroxysmal atrial fibrillation ([place]) 07/01/2016  . Hyperlipidemia 07/01/2016  . Cervical spondylosis with radiculopathy   . Asthmatic bronchitis, moderate persistent, uncomplicated 78/93/8101  . Chronic back pain   . ADVERSE DRUG REACTION 08/24/2009  . Asthma 08/19/2009  . PULMONARY NODULE 08/19/2009  . COUGH 08/19/2009  . PURE HYPERCHOLESTEROLEMIA 04/24/2009    Past Medical History:  Diagnosis Date  . Allergy   . Asthma   . Cervical spondylosis with radiculopathy    left c6 radiculopathy  .  Chronic back pain   . GERD (gastroesophageal reflux disease)   . Hyperlipidemia   . Hypertension     Relevant past medical, surgical, family and social history reviewed and updated as indicated. Interim medical history since our last visit reviewed.  Review of Systems Per HPI unless specifically indicated above     Objective:    There were no vitals taken for this visit.  Wt Readings from Last 3 Encounters:  03/23/20 204 lb (92.5 kg)  02/07/20 203 lb (92.1 kg)   12/10/19 201 lb (91.2 kg)    Physical Exam Physical examination unable to be performed due to lack of equipment.  Patient talking in complete sentences during examination.     Assessment & Plan:  1. Close exposure to COVID-19 virus Wife incidentally tested positive while at hospital s/p fall.  Patient reports wife is without symptoms.  Patient has no symptoms today and denies any recent symptoms.  Is fully vaccinated with 2 doses of vaccine and booster.  Discussed that positive test may stay positive for up to 3-6 months after initial infection.  Discussed pros and cons of obtaining test today and offered testing to patient.  Encouraged obtaining test and isolating promptly if symptoms start.  Patient verbalized understanding.  Follow up plan: Return if symptoms worsen or fail to improve.  This visit was completed via telephone due to the restrictions of the COVID-19 pandemic. All issues as above were discussed and addressed but no physical exam was performed. If it was felt that the patient should be evaluated in the office, they were directed there. The patient verbally consented to this visit. Patient was unable to complete an audio/visual visit due to Lack of equipment. . Location of the patient: home . Location of the provider: work . Those involved with this call:  . Provider: Carnella Guadalajara, DNP . CMA: Annabelle Harman, CMA . Front Desk/Registration: Santina Evans  . Time spent on call: 15 minutes on the phone discussing health concerns. 30 minutes total spent in review of patient's record and preparation of their chart.  I verified patient identity using two factors (patient name and date of birth). Patient consents verbally to being seen via telemedicine visit today.

## 2020-06-09 ENCOUNTER — Ambulatory Visit: Payer: PPO | Admitting: Internal Medicine

## 2020-06-09 LAB — SARS-COV-2 RNA,(COVID-19) QUALITATIVE NAAT: SARS CoV2 RNA: NOT DETECTED

## 2020-06-17 DIAGNOSIS — H353221 Exudative age-related macular degeneration, left eye, with active choroidal neovascularization: Secondary | ICD-10-CM | POA: Diagnosis not present

## 2020-07-06 DIAGNOSIS — D075 Carcinoma in situ of prostate: Secondary | ICD-10-CM | POA: Diagnosis not present

## 2020-07-06 DIAGNOSIS — C61 Malignant neoplasm of prostate: Secondary | ICD-10-CM | POA: Diagnosis not present

## 2020-07-14 ENCOUNTER — Other Ambulatory Visit: Payer: Self-pay | Admitting: Student

## 2020-07-14 ENCOUNTER — Other Ambulatory Visit: Payer: Self-pay | Admitting: *Deleted

## 2020-07-14 DIAGNOSIS — Z6829 Body mass index (BMI) 29.0-29.9, adult: Secondary | ICD-10-CM | POA: Insufficient documentation

## 2020-07-14 DIAGNOSIS — M542 Cervicalgia: Secondary | ICD-10-CM | POA: Insufficient documentation

## 2020-07-14 DIAGNOSIS — M546 Pain in thoracic spine: Secondary | ICD-10-CM | POA: Insufficient documentation

## 2020-07-14 DIAGNOSIS — R030 Elevated blood-pressure reading, without diagnosis of hypertension: Secondary | ICD-10-CM | POA: Insufficient documentation

## 2020-07-14 MED ORDER — RIVAROXABAN 20 MG PO TABS
ORAL_TABLET | ORAL | 1 refills | Status: DC
Start: 2020-07-14 — End: 2021-02-23

## 2020-07-14 NOTE — Telephone Encounter (Signed)
Xarelto 20mg  refill request received. Pt is 80 years old, weight-92.5kg, Crea-0.97 on 03/23/2020, last seen by Dr. Irish Lack on 08/19/19 and pending appt on 08/18/20, Diagnosis-Afib, CrCl-79.55ml/min; Dose is appropriate based on dosing criteria. Will send in refill to requested pharmacy.

## 2020-07-20 ENCOUNTER — Other Ambulatory Visit (HOSPITAL_COMMUNITY): Payer: Self-pay | Admitting: Urology

## 2020-07-20 DIAGNOSIS — C61 Malignant neoplasm of prostate: Secondary | ICD-10-CM

## 2020-07-22 DIAGNOSIS — H353221 Exudative age-related macular degeneration, left eye, with active choroidal neovascularization: Secondary | ICD-10-CM | POA: Diagnosis not present

## 2020-07-28 ENCOUNTER — Ambulatory Visit: Payer: PPO | Admitting: Internal Medicine

## 2020-07-28 DIAGNOSIS — H353211 Exudative age-related macular degeneration, right eye, with active choroidal neovascularization: Secondary | ICD-10-CM | POA: Diagnosis not present

## 2020-08-01 ENCOUNTER — Other Ambulatory Visit: Payer: Self-pay

## 2020-08-01 ENCOUNTER — Ambulatory Visit
Admission: RE | Admit: 2020-08-01 | Discharge: 2020-08-01 | Disposition: A | Discharge status: Home / Self Care | Payer: PPO | Source: Ambulatory Visit | Attending: Student | Admitting: Student

## 2020-08-01 DIAGNOSIS — M542 Cervicalgia: Secondary | ICD-10-CM

## 2020-08-01 DIAGNOSIS — M50223 Other cervical disc displacement at C6-C7 level: Secondary | ICD-10-CM | POA: Diagnosis not present

## 2020-08-01 DIAGNOSIS — M50323 Other cervical disc degeneration at C6-C7 level: Secondary | ICD-10-CM | POA: Diagnosis not present

## 2020-08-01 DIAGNOSIS — M5021 Other cervical disc displacement,  high cervical region: Secondary | ICD-10-CM | POA: Diagnosis not present

## 2020-08-01 DIAGNOSIS — M25512 Pain in left shoulder: Secondary | ICD-10-CM | POA: Diagnosis not present

## 2020-08-04 DIAGNOSIS — M5412 Radiculopathy, cervical region: Secondary | ICD-10-CM | POA: Diagnosis not present

## 2020-08-05 ENCOUNTER — Encounter: Payer: Self-pay | Admitting: Physical Therapy

## 2020-08-05 ENCOUNTER — Ambulatory Visit: Payer: PPO | Attending: Student | Admitting: Physical Therapy

## 2020-08-05 ENCOUNTER — Other Ambulatory Visit: Payer: Self-pay

## 2020-08-05 DIAGNOSIS — M546 Pain in thoracic spine: Secondary | ICD-10-CM | POA: Diagnosis not present

## 2020-08-05 NOTE — Therapy (Signed)
[place], [place], [address] Phone: [phone]   Fax:  [phone]  Physical Therapy Evaluation  Patient Details  Name: Tyler Allison MRN: [id number] Date of Birth: 16-Apr-1941 Referring Provider (PT): Eleonore Chiquito, NP   Encounter Date: 08/05/2020   PT End of Session - 08/05/20 1647    Visit Number 1    Number of Visits 10    Date for PT Re-Evaluation 09/16/20    Authorization Type HA MCR, recheck FOTO by visit 6 and progress note by visit 10    PT Start Time 0932    PT Stop Time 1016    PT Time Calculation (min) 44 min    Activity Tolerance Patient tolerated treatment well    Behavior During Therapy [facility] for tasks assessed/performed           Past Medical History:  Diagnosis Date  . Allergy   . Asthma   . Cancer [facility])    prostate cancer  . Cervical spondylosis with radiculopathy    left c6 radiculopathy  . Chronic back pain   . GERD (gastroesophageal reflux disease)   . Hyperlipidemia   . Hypertension     Past Surgical History:  Procedure Laterality Date  . SPINE SURGERY     back surgery x 3 including lumbar fusion, neck surgery x 2 including C4-6 ACDF  . tympanoplasty with mastoidectomy Left 01/13/2004    There were no vitals filed for this visit.    Subjective Assessment - 08/05/20 1635    Subjective Pt. is a 80 y/o male referred to PT for c/o thoracic pain. He reports onset of symptoms beginning in late January of this year associated with muscle strain from assisting his wife who was recovering from a hip fracture s/p surgery-assisting her with bed mobility and transfers. Symptoms since improving but still noting localized pain in bilateral lower thoracic region paraspinals. Pt. has previous history lumbar surgery x 3 including fusion as well as 2 previous neck surgeries including C4-6 ACDF-he also has neck pain and left UE radicular symptoms with recent MRI showing  potential left C6 radiculopathy currently pending treatment with injections.    Pertinent History active prostate CA, previous neck and back surgeries including fusions both regions, chronic back pain    Limitations Lifting;House hold activities    Diagnostic tests cervical MRI    Patient Stated Goals Resolve thoracic pain    Currently in Pain? Yes    Pain Score 2     Pain Location Back    Pain Orientation Mid    Pain Descriptors / Indicators Sharp   grabbing   Pain Type Acute pain    Pain Onset More than a month ago    Pain Frequency Intermittent    Aggravating Factors  lifting activities    Pain Relieving Factors avoidance lifting, ice may have helped with mild relief    Effect of Pain on Daily Activities Increased difficulty with lifting ADLs              [facility] PT Assessment - 08/05/20 0001      Assessment   Medical Diagnosis Thoracic pain    Referring Provider (PT) Eleonore Chiquito, NP    Onset Date/Surgical Date --   late January 2022   Hand Dominance Right    Prior Therapy none for current episode      Precautions   Precaution Comments active prostate CA      Restrictions  Weight Bearing Restrictions No      Balance Screen   Has the patient fallen in the past 6 months No      [place] residence    Living Arrangements Spouse/significant other      Prior Function   Level of Independence Independent with basic ADLs;Independent with community mobility without device      Cognition   Overall Cognitive Status Within Functional Limits for tasks assessed      Observation/Other Assessments   Focus on Therapeutic Outcomes (FOTO)  63% function      Posture/Postural Control   Posture Comments mildly increased thoracic kyphosis and rounded shoulders      ROM / Strength   AROM / PROM / Strength AROM;Strength      AROM   AROM Assessment Site Cervical;Lumbar    Cervical Flexion 45    Cervical Extension 20    Cervical -  Right Side Bend 29    Cervical - Left Side Bend 18   increased neck pain on left   Cervical - Right Rotation 42    Cervical - Left Rotation 35   increased pain on left   Lumbar Flexion 85    Lumbar Extension 10    Lumbar - Right Side Bend 25    Lumbar - Left Side Bend 18    Lumbar - Right Rotation 50%    Lumbar - Left Rotation 60%      Strength   Overall Strength Comments left shoulder ER and abduction 4+/5 otherwise bilat. UE grossly 5/5      Flexibility   Soft Tissue Assessment /Muscle Length --   SLR 70 deg with hamstring tightness     Palpation   Palpation comment TTP bilateral erector spinae/longissimus at approximately T12 region      Special Tests   Other special tests SLR (-)                      Objective measurements completed on examination: See above findings.       [facility] Adult PT Treatment/Exercise - 08/05/20 0001      Exercises   Exercises --   brief HEP handout review           Trigger Point Dry Needling - 08/05/20 0001    Consent Given? Yes    Education Handout Provided Yes    Muscles Treated Back/Hip Erector spinae    Dry Needling Comments needling in prone to bilateral erector spinae/longissimus at T11-L1 region wih 30 gauge 50 mm needles                PT Education - 08/05/20 1647    Education Details HEP, POC, dry needling    Person(s) Educated Patient    Methods Explanation;Verbal cues;Handout    Comprehension Verbalized understanding               PT Long Term Goals - 08/05/20 1656      PT LONG TERM GOAL #1   Title Independent with HEP    Baseline needs HEP    Time 6    Period Weeks    Status New    Target Date 09/16/20      PT LONG TERM GOAL #2   Title Improve FOTO outcome measure score to 74% or greater functional status    Baseline 63%    Time 6    Period Weeks    Status New    Target  Date 09/16/20      PT LONG TERM GOAL #3   Title Return body mechanics demos for lifting activities for chores     Baseline plan review/instruct as needed to ensure proper hip hinge    Time 6    Period Weeks    Status New    Target Date 09/16/20      PT LONG TERM GOAL #4   Title Perform lifting activities as needed without limitation due to thoracic pain symptoms    Time 6    Period Weeks    Status New    Target Date 09/16/20                  Plan - 08/05/20 1648    Clinical Impression Statement Pt. presents with lower thoracic region pain consistent with muscle strain/myofascial etiology with onset associated with frequent bending and lifting to assist his wife while she recovered from recent surgery. Pt. does have left UE radicular symptoms consistent with recent MRI findings of potential cervical radiculopathy now pending injections but for PT plan focus thoracic region per referral. Pt. also has prostate CA pending further follow up for tx. options but current back pain symptoms appear mechanical given aggs/eases and clear mechanism of onset but will continue to monitor. Pt. would benefit from PT to help relieve pain and address associated functonal limitations.    Personal Factors and Comorbidities Comorbidity 2    Comorbidities previous surgical history for neck and back, chronic back pain history, prostate CA    Examination-Activity Limitations Lift;Bend;Carry    Stability/Clinical Decision Making Stable/Uncomplicated    Clinical Decision Making Low    Rehab Potential Good    PT Frequency --   1-2x/week   PT Duration 6 weeks    PT Treatment/Interventions ADLs/Self Care Home Management;Cryotherapy;Therapeutic exercise;Patient/family education;Manual techniques;Dry needling;Neuromuscular re-education;Therapeutic activities    PT Next Visit Plan prostate CA-no [place] or estim to back and caution due to surgical history with any needling or manual tx., check response dry needling and continue as found beneficial, STM and if needed gentle mobilizations, flexion bias stretches for back, postural  strengtening, add thoracic extension in sitting with foot on stool    PT Home Exercise Plan Access code: [email or web address]    Consulted and Agree with Plan of Care Patient           Patient will benefit from skilled therapeutic intervention in order to improve the following deficits and impairments:  Pain,Increased muscle spasms,Increased fascial restricitons,Postural dysfunction  Visit Diagnosis: Pain in thoracic spine     Problem List Patient Active Problem List   Diagnosis Date Noted  . Close exposure to COVID-19 virus 06/08/2020  . Central sleep apnea 02/07/2020  . Nocturnal hypoxemia 03/01/2019  . GERD (gastroesophageal reflux disease) 10/24/2018  . Paroxysmal atrial fibrillation ([place]) 07/01/2016  . Hyperlipidemia 07/01/2016  . Cervical spondylosis with radiculopathy   . Asthmatic bronchitis, moderate persistent, uncomplicated 12/45/8099  . Chronic back pain   . ADVERSE DRUG REACTION 08/24/2009  . Asthma 08/19/2009  . PULMONARY NODULE 08/19/2009  . COUGH 08/19/2009  . PURE HYPERCHOLESTEROLEMIA 04/24/2009    Beaulah Dinning, PT, DPT 08/05/20 4:59 PM  [facility] [address] [place], [place], [address] Phone: [phone]   Fax:  [phone]  Name: Tyler Allison MRN: [id number] Date of Birth: Nov 29, 1940

## 2020-08-05 NOTE — Patient Instructions (Addendum)

## 2020-08-07 ENCOUNTER — Other Ambulatory Visit: Payer: Self-pay

## 2020-08-07 ENCOUNTER — Ambulatory Visit (HOSPITAL_COMMUNITY)
Admission: RE | Admit: 2020-08-07 | Discharge: 2020-08-07 | Disposition: A | Discharge status: Home / Self Care | Payer: PPO | Source: Ambulatory Visit | Attending: Urology | Admitting: Urology

## 2020-08-07 DIAGNOSIS — C61 Malignant neoplasm of prostate: Secondary | ICD-10-CM | POA: Insufficient documentation

## 2020-08-07 MED ORDER — PIFLIFOLASTAT F 18 (PYLARIFY) INJECTION
9.0000 | Freq: Once | INTRAVENOUS | Status: AC
  Administered 2020-08-07: 9 via INTRAVENOUS

## 2020-08-10 NOTE — Progress Notes (Signed)
HPI M former smoker followed for dyspnea, cough  Asthma, complicated by HBP, Hypercholesterolemia, PAFib, Lung nodule(2012), Cervical spondylosis, Dysphagia Labs- nl CMET, BNP and Hgb 08/28/2018 Echo 08/28/2018- EF 55-60%, no wall motion abnl or PH PFT 01/15/2019- Minimal obstruction, no resp to BD, Nl lung volumes, Nl Diffusion CTa chest 06/21/19- Aortic atherosclerosis, lungs clear 6MWT 10/30/19- 425 meters, lowest O2 sat on room air 95%, max HR 80 HST 12/04/19- AHI 11.8/ hr MOSTLY CENTRAL APNEAS, desaturation to 85%/ average 93%, body weight 203 lbs ------------------------------------------------------------------------   02/07/20- 79 yoM former smoker followed for dyspnea, cough, hx Nocturnal Hypoxemia,  Sleep Apnea/ Central, Asthma, complicated by HBP, Hypercholesterolemia, PAFib, Lung nodule(2012), Cervical spondylosis, Dysphagia, Prostate Cancer, Proair, Flonase, Symbicort 160/ Anoro sample, Zyrtec,     O2 2L sleep/ [employer]>> was dc'd by [place] at his request when he reported no longer in AFib. HST 12/04/19- AHI 11.8/ hr MOSTLY CENTRAL APNEAS, desaturation to 85%/ average 93%, body weight 203 lbs Covid vax- 2 Phizer   Booster next week Flu vax- done Body weight today- 203 lbs He breathes ok while sleeping. We are going to stay off O2 and recheck [place] on return . Still notes cough productive white., notat night, not related to food/ drink, mainly a nuisance.  He remains on Xarelto per cardiology.  08/11/20- 30 yoM former smoker followed for dyspnea, cough, hx Nocturnal Hypoxemia,  Sleep Apnea/ Central, Asthma, complicated by HBP, Hyperlipidemia, PAFib/ Xarelto, Lung nodule(2012), Cervical spondylosis, Dysphagia, Prostate Cancer, Proair, Flonase, Symbicort 160/ Anoro sample, Zyrtec Covid vax-3 Phizer Flu vax-had - Plan was to remain off O2 for sleep and recheck [place] on return ------Patient is feeling good overall, has been diagnosed with prostate cancer. No longer using oxygen. Breathing  comfortable at night. Morning cough productive white phlegm till airways clear. Stable DOE if he is very active. No acute changes.  Using rescue inhaler only occasionally. Neither Anoro or Symbicort seemed to make much difference. Not needing refills  Now dx'd prostate cancer - pending Urology f/u to consider options.   ROS-see HPI  + = positive Constitutional:    weight loss, night sweats, fevers, chills, fatigue, lassitude. HEENT:    headaches, difficulty swallowing, tooth/dental problems, sore throat,       sneezing, itching, ear ache, nasal congestion, post nasal drip, snoring CV:    chest pain, orthopnea, PND, swelling in lower extremities, anasarca,                                   dizziness, palpitations Resp:   +shortness of breath with exertion or at rest.                +productive cough,   +non-productive cough, coughing up of blood.              change in color of mucus.  wheezing.   Skin:    rash or lesions. GI:  No-   heartburn, indigestion, abdominal pain, nausea, vomiting, diarrhea,                 change in bowel habits, loss of appetite GU: dysuria, change in color of urine, no urgency or frequency.   flank pain. MS:   joint pain, stiffness, decreased range of motion, back pain. Neuro-     nothing unusual Psych:  change in mood or affect.  depression or anxiety.   memory loss.  OBJ- Physical Exam General- Alert, Oriented,  Affect-appropriate, Distress- none acute,  Skin- rash-none, lesions- none, excoriation- none Lymphadenopathy- none Head- atraumatic            Eyes- Gross vision intact, PERRLA, conjunctivae and secretions clear            Ears- Hearing, canals-normal            Nose- Clear, no-Septal dev, mucus, polyps, erosion, perforation             Throat- Mallampati II , mucosa clear , drainage- none, tonsils- atrophic,  Neck- flexible , trachea midline, no stridor , thyroid nl, carotid no bruit Chest - symmetrical excursion , unlabored           Heart/CV-  RRR faint , no murmur , no gallop  , no rub, nl s1 s2                           - JVD+1 cm , edema- none, stasis changes- none, varices- none           Lung- +clear, wheeze- none, cough- none , dullness-none, rub- none           Chest wall-  Abd-  Br/ Gen/ Rectal- Not done, not indicated Extrem- cyanosis- none, clubbing, none, atrophy- none, strength- nl Neuro- grossly intact to observation

## 2020-08-11 ENCOUNTER — Other Ambulatory Visit: Payer: Self-pay

## 2020-08-11 ENCOUNTER — Encounter: Payer: Self-pay | Admitting: Internal Medicine

## 2020-08-11 ENCOUNTER — Ambulatory Visit (INDEPENDENT_AMBULATORY_CARE_PROVIDER_SITE_OTHER): Payer: PPO

## 2020-08-11 ENCOUNTER — Ambulatory Visit: Payer: PPO | Admitting: Internal Medicine

## 2020-08-11 VITALS — BP 140/80 | HR 68 | Temp 98.2°F | Ht 70.5 in | Wt 206.4 lb

## 2020-08-11 DIAGNOSIS — J9811 Atelectasis: Secondary | ICD-10-CM | POA: Diagnosis not present

## 2020-08-11 DIAGNOSIS — G4731 Primary central sleep apnea: Secondary | ICD-10-CM

## 2020-08-11 DIAGNOSIS — J4540 Moderate persistent asthma, uncomplicated: Secondary | ICD-10-CM

## 2020-08-11 DIAGNOSIS — G4734 Idiopathic sleep related nonobstructive alveolar hypoventilation: Secondary | ICD-10-CM | POA: Diagnosis not present

## 2020-08-11 NOTE — Assessment & Plan Note (Signed)
Clinically this is a simple bronchitis. Neither Anoro or Symbicort had any effect. Plan- CXR

## 2020-08-11 NOTE — Patient Instructions (Signed)
Order- CXR   Dx chronic bronchitis   Order- overnight oximetry on room air    dxx Chronic bronchitis       Please call for results about 2 weeks after the overnight study so we can notify the home care company if they are slow to send results.   Good luck with your prostate issues !

## 2020-08-11 NOTE — Assessment & Plan Note (Signed)
Sleeping comfortably now without dyspnea. Plan- recheck overnight oximetry. Consider theophylline trial later if needed.

## 2020-08-11 NOTE — Assessment & Plan Note (Signed)
May not be significant. Plan- overnight oximetry on room iar

## 2020-08-12 ENCOUNTER — Encounter: Payer: PPO | Admitting: Physical Therapy

## 2020-08-16 NOTE — Progress Notes (Signed)
Cardiology Office Note   Date:  08/18/2020   ID:  Tyler Allison, Tyler Allison 11/23/1940, MRN [id number]  PCP:  [name], MD    No chief complaint on file.  PAF  Wt Readings from Last 3 Encounters:  08/18/20 206 lb 9.6 oz (93.7 kg)  08/11/20 206 lb 6.4 oz (93.6 kg)  03/23/20 204 lb (92.5 kg)       History of Present Illness: Tyler Allison is a 80 y.o. male  Who had neck surgery on 01/27/16. The anesthesia MD mentioned some irregular heart rhythm. Dr. Dennard Schaumann did a Holter and this showed PAF 15 days post op. He had some bradycardia at 3AM, presumably during sleep, (2 second pause).He was started on Xarelto.AFib rate has been as high as 142 bpm. Even with that, he did not have sx.  In 12/19, he had a bad sinus infection. Since then, he has not had the same stamina.  He is wheezing. He has had lung issues.   Sx of DOE have persisted. He can no longer walk a few miles like he used to.   ENT checked his airway and there were no issues per his report.   He used to have allergies. He used to get shots, but now takes an OTC tablet.  He starts the morning with a cough that brings up some phlegm in the morning.  At visit in April 2020, I thought his shortness of breath was more pulmonary.  Pro-BNP was low at 187.  He was going to discuss inhaled steroid with his PCP to see if this would help.  In 4/21, it was noted that "he still has DOE, with walking, or moving furniture.  He has to stop now frequently when he walks to check the mail.  He can feel SHOB even when at rest.  Echo 08/28/2018- EF 55-60%, no wall motion abnl or PH PFT 01/15/2019-Minimal obstruction, no resp to BD, Nl lung volumes, Nl Diffusion  2/21 CT showed: "No evidence of pulmonary embolism. No acute intrathoracic process. 2. Aortic atherosclerosis (ICD10-I70.0)."  He has had asthma since he was a child.  He has some relief from his inhalers."   Since the last visit he was diagnosed  with prostate cancer.  He has had two biopsies.  He will starting radiation therapy.   Denies : Chest pain. Dizziness. Leg edema. Nitroglycerin use. Orthopnea. Palpitations. Paroxysmal nocturnal dyspnea.  Syncope.   Walking has decreased due to joint pains.  Some DOE with walking a trail.  Back pain with walking on flat ground.  He got his COVID shots and booster.      Past Medical History:  Diagnosis Date  . Allergy   . Asthma   . Cancer [facility])    prostate cancer  . Cervical spondylosis with radiculopathy    left c6 radiculopathy  . Chronic back pain   . GERD (gastroesophageal reflux disease)   . Hyperlipidemia   . Hypertension     Past Surgical History:  Procedure Laterality Date  . SPINE SURGERY     back surgery x 3 including lumbar fusion, neck surgery x 2 including C4-6 ACDF  . tympanoplasty with mastoidectomy Left 01/13/2004     Current Outpatient Medications  Medication Sig Dispense Refill  . albuterol (PROAIR HFA) 108 (90 BASE) MCG/ACT inhaler Inhale 2 puffs into the lungs every 4 (four) hours as needed. 8.5 g 3  . atorvastatin (LIPITOR) 40 MG tablet TAKE 1 TABLET(40 MG) BY MOUTH DAILY  90 tablet 1  . clonazePAM (KLONOPIN) 0.5 MG tablet TAKE 1 TABLET(0.5 MG) BY MOUTH TWICE DAILY AS NEEDED FOR ANXIETY 60 tablet 1  . fluticasone (FLONASE) 50 MCG/ACT nasal spray USE 2 SPRAYS IN EACH NOSTRIL EVERY DAY AT NIGHT 16 g 6  . metoprolol succinate (TOPROL-XL) 25 MG 24 hr tablet Take 1 tablet by mouth daily 90 tablet 2  . pantoprazole (PROTONIX) 40 MG tablet TAKE 1 TABLET(40 MG) BY MOUTH TWICE DAILY 180 tablet 3  . rivaroxaban (XARELTO) 20 MG TABS tablet Take 1 tablet by mouth daily with supper 90 tablet 1  . tamsulosin (FLOMAX) 0.4 MG CAPS capsule Take 1 capsule (0.4 mg total) by mouth daily. 90 capsule 3  . traMADol (ULTRAM) 50 MG tablet Take by mouth every 6 (six) hours as needed.     No current facility-administered medications for this visit.    Allergies:   Codeine     Social History:  The patient  reports that he quit smoking about 51 years ago. His smoking use included cigarettes. He started smoking about 65 years ago. He has a 6.50 pack-year smoking history. He has never used smokeless tobacco. He reports that he does not drink alcohol and does not use drugs.   Family History:  The patient's family history includes Heart attack in his maternal uncle; Heart failure in his mother.    ROS:  Please see the history of present illness.   Otherwise, review of systems are positive for occasional joint and back pain.   All other systems are reviewed and negative.    PHYSICAL EXAM: VS:  BP 114/78   Pulse (!) 57   Ht 5' 10.5" (1.791 m)   Wt 206 lb 9.6 oz (93.7 kg)   SpO2 97%   BMI 29.23 kg/m  , BMI Body mass index is 29.23 kg/m. GEN: Well nourished, well developed, in no acute distress  HEENT: normal  Neck: no JVD, carotid bruits, or masses Cardiac: RRR; no murmurs, rubs, or gallops,no edema  Respiratory:  clear to auscultation bilaterally, normal work of breathing GI: soft, nontender, nondistended, + BS MS: no deformity or atrophy  Skin: warm and dry, no rash Neuro:  Strength and sensation are intact Psych: euthymic mood, full affect   EKG:   The ekg ordered today demonstrates NSR, PVC, no ST changes   Recent Labs: 03/23/2020: ALT 15; BUN 17; Creat 0.97; Hemoglobin 14.6; Platelets 231; Potassium 4.5; Sodium 137   Lipid Panel    Component Value Date/Time   CHOL 141 03/23/2020 0930   TRIG 120 03/23/2020 0930   HDL 51 03/23/2020 0930   CHOLHDL 2.8 03/23/2020 0930   VLDL 31 (H) 03/13/2015 0816   LDLCALC 69 03/23/2020 0930     Other studies Reviewed: Additional studies/ records that were reviewed today with results demonstrating: labs reviewed.   ASSESSMENT AND PLAN:  1. PAF: In NSR.  Continue metoprolol. 2. Anticoagulated: Xarelto for stroke prevention. Hbg, Cr normal in 11/21.  3. Hyperlipidemia: LDL 69.   4. Persistent shortness  of breath: unchanged. Deconditioning. Back pain limits activity which limits stamina. COuld try recumbent bike or water aerobics.   Current medicines are reviewed at length with the patient today.  The patient concerns regarding his medicines were addressed.  The following changes have been made:  No change  Labs/ tests ordered today include:  No orders of the defined types were placed in this encounter.   Recommend 150 minutes/week of aerobic exercise Low fat, low carb,  high fiber diet recommended  Disposition:   FU in 1 year   Signed, Larae Grooms, MD  08/18/2020 10:00 AM    [address], [place], [place]  [address] Phone: [phone]; Fax: [phone]

## 2020-08-17 DIAGNOSIS — R0683 Snoring: Secondary | ICD-10-CM | POA: Diagnosis not present

## 2020-08-17 DIAGNOSIS — G4730 Sleep apnea, unspecified: Secondary | ICD-10-CM | POA: Diagnosis not present

## 2020-08-18 ENCOUNTER — Encounter: Payer: Self-pay | Admitting: Interventional Cardiology

## 2020-08-18 ENCOUNTER — Encounter: Payer: PPO | Admitting: Physical Therapy

## 2020-08-18 ENCOUNTER — Ambulatory Visit: Payer: PPO | Admitting: Interventional Cardiology

## 2020-08-18 ENCOUNTER — Telehealth: Payer: Self-pay | Admitting: Family Medicine

## 2020-08-18 ENCOUNTER — Other Ambulatory Visit: Payer: Self-pay

## 2020-08-18 VITALS — BP 114/78 | HR 57 | Ht 70.5 in | Wt 206.6 lb

## 2020-08-18 DIAGNOSIS — Z7901 Long term (current) use of anticoagulants: Secondary | ICD-10-CM | POA: Diagnosis not present

## 2020-08-18 DIAGNOSIS — R0602 Shortness of breath: Secondary | ICD-10-CM | POA: Diagnosis not present

## 2020-08-18 DIAGNOSIS — E782 Mixed hyperlipidemia: Secondary | ICD-10-CM

## 2020-08-18 DIAGNOSIS — C61 Malignant neoplasm of prostate: Secondary | ICD-10-CM | POA: Diagnosis not present

## 2020-08-18 DIAGNOSIS — I480 Paroxysmal atrial fibrillation: Secondary | ICD-10-CM | POA: Diagnosis not present

## 2020-08-18 NOTE — Patient Instructions (Signed)
Medication Instructions:  Your physician recommends that you continue on your current medications as directed. Please refer to the Current Medication list given to you today.  *If you need a refill on your cardiac medications before your next appointment, please call your pharmacy*   Lab Work: none If you have labs (blood work) drawn today and your tests are completely normal, you will receive your results only by: . MyChart Message (if you have MyChart) OR . A paper copy in the mail If you have any lab test that is abnormal or we need to change your treatment, we will call you to review the results.   Testing/Procedures: none   Follow-Up: At [facility], you and your health needs are our priority.  As part of our continuing mission to provide you with exceptional heart care, we have created designated Provider Care Teams.  These Care Teams include your primary Cardiologist (physician) and Advanced Practice Providers (APPs -  Physician Assistants and Nurse Practitioners) who all work together to provide you with the care you need, when you need it.  We recommend signing up for the patient portal called "MyChart".  Sign up information is provided on this After Visit Summary.  MyChart is used to connect with patients for Virtual Visits (Telemedicine).  Patients are able to view lab/test results, encounter notes, upcoming appointments, etc.  Non-urgent messages can be sent to your provider as well.   To learn more about what you can do with MyChart, go to [email or web address].    Your next appointment:   12 month(s)  The format for your next appointment:   In Person  Provider:   You may see Jayadeep Varanasi, MD or one of the following Advanced Practice Providers on your designated Care Team:    Dayna Dunn, PA-C  Michele Lenze, PA-C    Other Instructions  High-Fiber Eating Plan Fiber, also called dietary fiber, is a type of carbohydrate. It is found foods such as fruits,  vegetables, whole grains, and beans. A high-fiber diet can have many health benefits. Your health care provider may recommend a high-fiber diet to help:  Prevent constipation. Fiber can make your bowel movements more regular.  Lower your cholesterol.  Relieve the following conditions: ? Inflammation of veins in the anus (hemorrhoids). ? Inflammation of specific areas of the digestive tract (uncomplicated diverticulosis). ? A problem of the large intestine, also called the colon, that sometimes causes pain and diarrhea (irritable bowel syndrome, or IBS).  Prevent overeating as part of a weight-loss plan.  Prevent heart disease, type 2 diabetes, and certain cancers. What are tips for following this plan? Reading food labels  Check the nutrition facts label on food products for the amount of dietary fiber. Choose foods that have 5 grams of fiber or more per serving.  The goals for recommended daily fiber intake include: ? Men (age 50 or younger): 34-38 g. ? Men (over age 50): 28-34 g. ? Women (age 50 or younger): 25-28 g. ? Women (over age 50): 22-25 g. Your daily fiber goal is _____________ g.   Shopping  Choose whole fruits and vegetables instead of processed forms, such as apple juice or applesauce.  Choose a wide variety of high-fiber foods such as avocados, lentils, oats, and kidney beans.  Read the nutrition facts label of the foods you choose. Be aware of foods with added fiber. These foods often have high sugar and sodium amounts per serving. Cooking  Use whole-grain flour for baking and cooking.    Cook with brown rice instead of white rice. Meal planning  Start the day with a breakfast that is high in fiber, such as a cereal that contains 5 g of fiber or more per serving.  Eat breads and cereals that are made with whole-grain flour instead of refined flour or white flour.  Eat brown rice, bulgur wheat, or millet instead of white rice.  Use beans in place of meat in  soups, salads, and pasta dishes.  Be sure that half of the grains you eat each day are whole grains. General information  You can get the recommended daily intake of dietary fiber by: ? Eating a variety of fruits, vegetables, grains, nuts, and beans. ? Taking a fiber supplement if you are not able to take in enough fiber in your diet. It is better to get fiber through food than from a supplement.  Gradually increase how much fiber you consume. If you increase your intake of dietary fiber too quickly, you may have bloating, cramping, or gas.  Drink plenty of water to help you digest fiber.  Choose high-fiber snacks, such as berries, raw vegetables, nuts, and popcorn. What foods should I eat? Fruits Berries. Pears. Apples. Oranges. Avocado. Prunes and raisins. Dried figs. Vegetables Sweet potatoes. Spinach. Kale. Artichokes. Cabbage. Broccoli. Cauliflower. Green peas. Carrots. Squash. Grains Whole-grain breads. Multigrain cereal. Oats and oatmeal. Brown rice. Barley. Bulgur wheat. Millet. Quinoa. Bran muffins. Popcorn. Rye wafer crackers. Meats and other proteins Navy beans, kidney beans, and pinto beans. Soybeans. Split peas. Lentils. Nuts and seeds. Dairy Fiber-fortified yogurt. Beverages Fiber-fortified soy milk. Fiber-fortified orange juice. Other foods Fiber bars. The items listed above may not be a complete list of recommended foods and beverages. Contact a dietitian for more information. What foods should I avoid? Fruits Fruit juice. Cooked, strained fruit. Vegetables Fried potatoes. Canned vegetables. Well-cooked vegetables. Grains White bread. Pasta made with refined flour. White rice. Meats and other proteins Fatty cuts of meat. Fried chicken or fried fish. Dairy Milk. Yogurt. Cream cheese. Sour cream. Fats and oils Butters. Beverages Soft drinks. Other foods Cakes and pastries. The items listed above may not be a complete list of foods and beverages to avoid.  Talk with your dietitian about what choices are best for you. Summary  Fiber is a type of carbohydrate. It is found in foods such as fruits, vegetables, whole grains, and beans.  A high-fiber diet has many benefits. It can help to prevent constipation, lower blood cholesterol, aid weight loss, and reduce your risk of heart disease, diabetes, and certain cancers.  Increase your intake of fiber gradually. Increasing fiber too quickly may cause cramping, bloating, and gas. Drink plenty of water while you increase the amount of fiber you consume.  The best sources of fiber include whole fruits and vegetables, whole grains, nuts, seeds, and beans. This information is not intended to replace advice given to you by your health care provider. Make sure you discuss any questions you have with your health care provider. Document Revised: 08/29/2019 Document Reviewed: 08/29/2019 Elsevier Patient Education  2021 Elsevier Inc.   

## 2020-08-18 NOTE — Progress Notes (Signed)
  Chronic Care Management   Note  08/18/2020 Name: Tyler Allison MRN: [id number] DOB: 1940/07/17  Tyler Allison is a 80 y.o. year old male who is a primary care patient of [name], MD. I reached out to Hendricks Limes by phone today in response to a referral sent by Tyler Allison's PCP, [name], MD.   Tyler Allison was given information about Chronic Care Management services today including:  1. CCM service includes personalized support from designated clinical staff supervised by his physician, including individualized plan of care and coordination with other care providers 2. 24/7 contact phone numbers for assistance for urgent and routine care needs. 3. Service will only be billed when office clinical staff spend 20 minutes or more in a month to coordinate care. 4. Only one practitioner may furnish and bill the service in a calendar month. 5. The patient may stop CCM services at any time (effective at the end of the month) by phone call to the office staff.   Patient agreed to services and verbal consent obtained.   Follow up plan:   Carley Perdue [employer] Scheduler

## 2020-08-26 ENCOUNTER — Encounter: Payer: PPO | Admitting: Physical Therapy

## 2020-08-26 DIAGNOSIS — H353221 Exudative age-related macular degeneration, left eye, with active choroidal neovascularization: Secondary | ICD-10-CM | POA: Diagnosis not present

## 2020-08-31 ENCOUNTER — Ambulatory Visit
Admission: RE | Admit: 2020-08-31 | Discharge: 2020-08-31 | Disposition: A | Discharge status: Home / Self Care | Payer: PPO | Source: Ambulatory Visit | Attending: Radiation Oncology | Admitting: Radiation Oncology

## 2020-08-31 ENCOUNTER — Encounter: Payer: Self-pay | Admitting: Radiation Oncology

## 2020-08-31 ENCOUNTER — Other Ambulatory Visit: Payer: Self-pay

## 2020-08-31 VITALS — Ht 70.5 in | Wt 206.0 lb

## 2020-08-31 DIAGNOSIS — C61 Malignant neoplasm of prostate: Secondary | ICD-10-CM

## 2020-08-31 DIAGNOSIS — R972 Elevated prostate specific antigen [PSA]: Secondary | ICD-10-CM | POA: Diagnosis not present

## 2020-08-31 HISTORY — DX: Malignant neoplasm of prostate: C61

## 2020-08-31 NOTE — Progress Notes (Signed)
GU Location of Tumor / Histology: prostatic adenocarcinoma  If Prostate Cancer, Gleason Score is (4 + 4) and PSA is (8.76). Prostate volume: 37.8 g  Tyler Allison had a prostate biopsy in April 2021 which revealed Gleason 3+4 disease. Patient opted for active surveillance. Unfortunately, surveillance biopsy revealed progression.  Biopsies of prostate (if applicable) revealed:    Past/Anticipated interventions by urology, if any: prostate biopsy, prescribed tamsulosin (pt continues to take this, active surveillance, repeat biopsy, PET scan (right lateral mid gland), referral to Dr. Tammi Klippel to discuss radiation options.  Past/Anticipated interventions by medical oncology, if any: no  Weight changes, if any: no  Bowel/Bladder complaints, if any: IPSS 6. SHIM 1; not sexually active. Denies dysuria, hematuria, urinary leakage or incontinence. Denies any bowel complaints.   Nausea/Vomiting, if any: no  Pain issues, if any:  no  SAFETY ISSUES:  Prior radiation? no  Pacemaker/ICD? no  Possible current pregnancy? no  Is the patient on methotrexate? no  Current Complaints / other details:  80 year old male. Married. Resides in [place]. Two children but one deceased. Father - esophageal.

## 2020-08-31 NOTE — Progress Notes (Signed)
Radiation Oncology         [phone] ________________________________  Initial outpatient Consultation  Name: GRANVILL HASELTON MRN: [id number]  Date: 08/31/2020  DOB: 1940-07-07  [id number], Cammie Mcgee, MD  Raynelle Bring, MD   REFERRING PHYSICIAN: Raynelle Bring, MD  DIAGNOSIS: 80 y.o. gentleman with stage T1c adenocarcinoma of the prostate with a Gleason's score of 4+4 and a PSA of 8.76    ICD-10-CM   1. Malignant neoplasm of prostate ([place])  [place] ILLNESS::Wolfgang Viona Gilmore Deloe is a 80 y.o. gentleman.  He was noted to have an elevated PSA of 7.27 by his primary care physician, Dr. Jenna Luo.  Accordingly, he was referred for evaluation in urology by Dr. Alinda Money and digital rectal examination was performed at that time revealing no nodules.  The patient proceeded to transrectal ultrasound with 12 biopsies of the prostate in September 05, 2019.  The prostate volume measured 39.4 cc.  Out of 12 core biopsies, 3 were positive.  The maximum Gleason score was 3+4, and this was seen in right apex.    He opted for active surveillance.  MRI in May 19, 2020 showed a 3.9 cm PI-RADS 5 lesion in the anterior gland with likely extracapsular extension.     The patient proceeded to MRI fusion transrectal ultrasound with 12 biopsies of the prostate on 07/06/20.  The prostate volume measured 37.8 cc.  Out of 16 core biopsies, 5 were positive.  The maximum Gleason score was 4+4, and this was seen in right mid.    PSMA PET on 08/07/20 showed increased uptake in the right prostate with no metastatic disease.   The patient reviewed the biopsy and scan results with his urologist and he has kindly been referred today for discussion of potential radiation treatment options.  PREVIOUS RADIATION THERAPY: No  PAST MEDICAL HISTORY:  has a past medical history of Allergy, Asthma, Cancer ([place]), Cervical spondylosis with radiculopathy, Chronic back pain, GERD (gastroesophageal reflux disease),  Hyperlipidemia, and Hypertension.    PAST SURGICAL HISTORY: Past Surgical History:  Procedure Laterality Date  . SPINE SURGERY     back surgery x 3 including lumbar fusion, neck surgery x 2 including C4-6 ACDF  . tympanoplasty with mastoidectomy Left 01/13/2004    FAMILY HISTORY: family history includes Heart attack in his maternal uncle; Heart failure in his mother.  SOCIAL HISTORY:  reports that he quit smoking about 51 years ago. His smoking use included cigarettes. He started smoking about 65 years ago. He has a 6.50 pack-year smoking history. He has never used smokeless tobacco. He reports that he does not drink alcohol and does not use drugs.  ALLERGIES: Codeine  MEDICATIONS:  Current Outpatient Medications  Medication Sig Dispense Refill  . albuterol (PROAIR HFA) 108 (90 BASE) MCG/ACT inhaler Inhale 2 puffs into the lungs every 4 (four) hours as needed. 8.5 g 3  . atorvastatin (LIPITOR) 40 MG tablet TAKE 1 TABLET(40 MG) BY MOUTH DAILY 90 tablet 1  . clonazePAM (KLONOPIN) 0.5 MG tablet TAKE 1 TABLET(0.5 MG) BY MOUTH TWICE DAILY AS NEEDED FOR ANXIETY 60 tablet 1  . fluticasone (FLONASE) 50 MCG/ACT nasal spray USE 2 SPRAYS IN EACH NOSTRIL EVERY DAY AT NIGHT 16 g 6  . metoprolol succinate (TOPROL-XL) 25 MG 24 hr tablet Take 1 tablet by mouth daily 90 tablet 2  . pantoprazole (PROTONIX) 40 MG tablet TAKE 1 TABLET(40 MG) BY MOUTH TWICE DAILY 180 tablet 3  . rivaroxaban (XARELTO) 20  MG TABS tablet Take 1 tablet by mouth daily with supper 90 tablet 1  . tamsulosin (FLOMAX) 0.4 MG CAPS capsule Take 1 capsule (0.4 mg total) by mouth daily. 90 capsule 3  . traMADol (ULTRAM) 50 MG tablet Take by mouth every 6 (six) hours as needed.     No current facility-administered medications for this visit.    REVIEW OF SYSTEMS:  A 15 point review of systems is documented in the electronic medical record. This was obtained by the nursing staff. However, I reviewed this with the patient to discuss  relevant findings and make appropriate changes.  Pertinent items are noted in HPI..  The patient completed an IPSS and SHIM questionnaire.  His IPSS score was 6 indicating mild urinary outflow obstructive symptoms.  SHIM was 1.   PHYSICAL EXAM: This patient is in no acute distress.  He is alert and oriented.   vitals were not taken for this visit.  He exhibits no respiratory distress or labored breathing.  He appears neurologically intact.  His mood is pleasant.  His affect is appropriate.  Please note the digital rectal exam findings described above.  KPS = 100  100 - Normal; no complaints; no evidence of disease. 90   - Able to carry on normal activity; minor signs or symptoms of disease. 80   - Normal activity with effort; some signs or symptoms of disease. 6   - Cares for self; unable to carry on normal activity or to do active work. 60   - Requires occasional assistance, but is able to care for most of his personal needs. 50   - Requires considerable assistance and frequent medical care. 50   - Disabled; requires special care and assistance. 7   - Severely disabled; hospital admission is indicated although death not imminent. 83   - Very sick; hospital admission necessary; active supportive treatment necessary. 10   - Moribund; fatal processes progressing rapidly. 0     - Dead  Karnofsky DA, Abelmann [place], Craver LS and Burchenal [facility] [phone]) The use of the nitrogen mustards in the palliative treatment of carcinoma: with particular reference to bronchogenic carcinoma Cancer 1 [id number]   LABORATORY DATA:  Lab Results  Component Value Date   WBC 7.2 03/23/2020   HGB 14.6 03/23/2020   HCT 42.8 03/23/2020   MCV 92.2 03/23/2020   PLT 231 03/23/2020   Lab Results  Component Value Date   NA 137 03/23/2020   K 4.5 03/23/2020   CL 102 03/23/2020   CO2 26 03/23/2020   Lab Results  Component Value Date   ALT 15 03/23/2020   AST 23 03/23/2020   ALKPHOS 94 08/28/2018   BILITOT 0.6  03/23/2020     RADIOGRAPHY: DG Chest 2 View  Result Date: 08/11/2020 CLINICAL DATA:  Chronic bronchitis. EXAM: CHEST - 2 VIEW COMPARISON:  CT 07/05/2019.  Chest x-ray 09/10/2018. FINDINGS: Mediastinum and hilar structures normal. Mild peribronchial cuffing. Bronchitis could present this fashion. Low lung volumes with mild bibasilar atelectasis. No pleural effusion or pneumothorax. Biapical pleural thickening consistent with scarring. Interposition of the colon under the hemidiaphragms again noted. Degenerative change thoracic spine. Prior cervical spine fusion. IMPRESSION: Mild bilateral peribronchial cuffing. Bronchitis could present this fashion. Low lung volumes with mild bibasilar atelectasis. Electronically Signed   By: Marcello Moores  Register   On: 08/11/2020 11:40   MR CERVICAL SPINE WO CONTRAST  Result Date: 08/03/2020 CLINICAL DATA:  Left-sided neck pain left-sided neck and shoulder pain radiating to the  hand EXAM: MRI CERVICAL SPINE WITHOUT CONTRAST TECHNIQUE: Multiplanar, multisequence MR imaging of the cervical spine was performed. No intravenous contrast was administered. COMPARISON:  01/09/2016 FINDINGS: Alignment: Mild retrolisthesis at C3-4. General straightening of the cervical spine Vertebrae: C4-C6 ACDF with solid arthrodesis. Mild edematous appearance at the anterior dens. No visible erosion or calcification about the atlantal dental joint. Cord: Normal signal and morphology. Posterior Fossa, vertebral arteries, paraspinal tissues: Negative Disc levels: C2-3: Degenerative facet spurring asymmetric to the left. C3-4: Disc collapse with endplate ridging and biforaminal uncovertebral spurring. Biforaminal impingement C4-5: ACDF with solid arthrodesis.  Improved canal patency C5-6: ACDF with solid arthrodesis.  No bony impingement. C6-7: Disc narrowing with bulging and endplate ridging. No significant change from prior negative facets. The canal and foramina are patent. C7-T1:Disc narrowing and  endplate spurring. IMPRESSION: 1. C3-4 degenerative biforaminal impingement which has progressed since 2017. 2. C4-C6 ACDF with solid arthrodesis. 3. Marrow edema at the anterior dens suggesting atlantodental arthropathy. Electronically Signed   By: Monte Fantasia M.D.   On: 08/03/2020 04:17   NM PET (F18-PYLARIFY) SKULL TO MID THIGH  Result Date: 08/09/2020 CLINICAL DATA:  Prostate carcinoma with biochemical recurrence. EXAM: NUCLEAR MEDICINE PET SKULL BASE TO THIGH TECHNIQUE: 88.0 mCi F18 Piflufolastat (Pylarify) was injected intravenously. Full-ring PET imaging was performed from the skull base to thigh after the radiotracer. CT data was obtained and used for attenuation correction and anatomic localization. COMPARISON:  MRI 10/17/2020 FINDINGS: NECK No radiotracer activity in neck lymph nodes. Incidental CT finding: None CHEST No radiotracer accumulation within mediastinal or hilar lymph nodes. No suspicious pulmonary nodules on the CT scan. Incidental CT finding: None ABDOMEN/PELVIS Prostate: Intense focal activity within the RIGHT lateral mid gland with SUV max equal 6.5. Lesion is approximately 1.2 cm Lymph nodes: No abnormal radiotracer accumulation within pelvic or abdominal nodes. Liver: No evidence of liver metastasis Incidental CT finding: Posterior lumbar fusion. Atherosclerotic calcification of the aorta. SKELETON No focal  activity to suggest skeletal metastasis. IMPRESSION: 1. Focal activity in the RIGHT lateral mid gland most consistent with primary prostate adenocarcinoma. 2. No evidence of metastatic adenopathy in the pelvis or periaortic retroperitoneum. 3. No visceral metastasis or skeletal metastasis. Electronically Signed   By: Suzy Bouchard M.D.   On: 08/09/2020 11:54      IMPRESSION: This is a 80 y.o. gentleman with stage T1c adenocarcinoma of the prostate with a Gleason's score of 4+4 and a PSA of 8.76.  His Gleason's Score puts him into the high risk group.  Accordingly he is  eligible for a variety of potential treatment options including LT-ADT with IMRT using either LDR brachytherapy boost or IMRT boost.  PLAN:Today I reviewed the findings and workup thus far.  We discussed the natural history of prostate cancer.  We reviewed the the implications of T-stage, Gleason's Score, and PSA on decision-making and outcomes in prostate cancer.  We discussed radiation treatment in the management of prostate cancer with regard to the logistics and delivery of external beam radiation treatment as well as the logistics and delivery of prostate brachytherapy.  We compared and contrasted each of these approaches and also compared these against prostatectomy.  The patient expressed interest in external beam radiotherapy.  I filled out a patient counseling form for him with relevant treatment diagrams and we retained a copy for our records.   The patient would like to proceed with LT-ADT and IMRT.  I will share my findings with Dr. Alinda Money and move forward with initation  of ADT to be followed by placement of 3 gold markers and SpaceOAR in 6-8 weeks to be followed by IMRT.  I enjoyed meeting with him today, and will look forward to participating in the care of this very nice gentleman.   I spent 60 minutes total in this encounter.   ------------------------------------------------  Sheral Apley Tammi Klippel, M.D.

## 2020-09-02 ENCOUNTER — Encounter: Payer: PPO | Admitting: Physical Therapy

## 2020-09-03 DIAGNOSIS — M5412 Radiculopathy, cervical region: Secondary | ICD-10-CM | POA: Diagnosis not present

## 2020-09-04 ENCOUNTER — Telehealth: Payer: Self-pay | Admitting: Interventional Cardiology

## 2020-09-04 NOTE — Telephone Encounter (Signed)
Patient was calling in to see if it is possible that he can samples of rivaroxaban (XARELTO) 20 MG TABS tablet. Please advise

## 2020-09-04 NOTE — Telephone Encounter (Signed)
I spoke with patient and told him samples would be at front desk for him to pick up.  Patient reports he contacted assistance program and has been denied assistance due to being slightly over income amount on his 2021 tax records.  He had financial changes at the end of 2021 and his current income is less than it was in 2021.  He is not in donut hole yet but expects to be in July. Will forward to [place] to see if there are any other assistance options for patient

## 2020-09-04 NOTE — Telephone Encounter (Signed)
**Note De-Identified Eve[facility] Brod Obfuscation** The pt states that he is paying $90/90day supply of Xarelto which is normal cost for Medicare Part D. He states this cost is a hardship for him and that he has been getting samples of Xarelto from his PCP as often as possible.  I did express the importance of him taking his Xarelto as directed daily without any missed doses and the risks involved he he does miss doses.  We discussed him switching to Warfarin as it is the only generic anticoagulant currently on the market but he is not at all interested in taking that medication.  He states that he thinks he can afford his Xarelto going forward and is aware that our samples are limited and that we may not be able to give him any more going forward. I advised him to call [name] and [name] to discuss his eligibility to be approved for asst once he falls into his "ins gap/donut hole" later this year.  He thanked me for calling him back to discuss.

## 2020-09-07 ENCOUNTER — Telehealth: Payer: Self-pay | Admitting: Internal Medicine

## 2020-09-07 NOTE — Telephone Encounter (Signed)
Called and spoke with patient who is calling to get results of ONO test. Advised patient that I would check with Dr. Annamaria Boots and in his box to see if we have the results. If we didn't have them I would call [employer] to get them faxed over. Patient expressed understanding. Will route to Dr. Annamaria Boots to see if he has received these.

## 2020-09-07 NOTE — Telephone Encounter (Signed)
Spoke to patient and relayed below results.  Patient stated that he would like to hold off on oxygen for now, due to chemo treatments.  He will call back when he wished to proceed.   Routing to Dr. Annamaria Boots as an Juluis Rainier.

## 2020-09-07 NOTE — Telephone Encounter (Signed)
Overnight oximetry did show repeated drops in oxygen while sleeping  Recommend we re-order DME [employer] [facility] O2 for sleep 2L    dx Nocturnal Hypoxemia

## 2020-09-08 ENCOUNTER — Encounter: Payer: PPO | Admitting: Physical Therapy

## 2020-09-11 ENCOUNTER — Telehealth: Payer: Self-pay | Admitting: Interventional Cardiology

## 2020-09-11 ENCOUNTER — Telehealth: Payer: Self-pay | Admitting: *Deleted

## 2020-09-11 ENCOUNTER — Encounter: Payer: Self-pay | Admitting: Urology

## 2020-09-11 DIAGNOSIS — I10 Essential (primary) hypertension: Secondary | ICD-10-CM | POA: Insufficient documentation

## 2020-09-11 DIAGNOSIS — I700 Atherosclerosis of aorta: Secondary | ICD-10-CM

## 2020-09-11 DIAGNOSIS — C61 Malignant neoplasm of prostate: Secondary | ICD-10-CM | POA: Diagnosis not present

## 2020-09-11 DIAGNOSIS — Z5111 Encounter for antineoplastic chemotherapy: Secondary | ICD-10-CM | POA: Diagnosis not present

## 2020-09-11 NOTE — Telephone Encounter (Signed)
   [facility] Pre-operative Risk Assessment    Patient Name: Tyler Allison  DOB: October 09, 1940  MRN: [id number]   HEARTCARE STAFF: - Please ensure there is not already an duplicate clearance open for this procedure. - Under Visit Info/Reason for Call, type in Other and utilize the format Clearance MM/DD/YY or Clearance TBD. Do not use dashes or single digits. - If request is for dental extraction, please clarify the # of teeth to be extracted.  Request for surgical clearance:  1. What type of surgery is being performed?  Fiducial Marker Space OAR  2. When is this surgery scheduled? 10/15/2020  3. What type of clearance is required (medical clearance vs. Pharmacy clearance to hold med vs. Both)? Both   4. Are there any medications that need to be held prior to surgery and how long? Xarelto 3 days prior  5. Practice name and name of physician performing surgery? [facility]; Dr. Link Snuffer   6. What is the office phone number? [phone] Ext. [phone]   7.   What is the office fax number?  [phone]  8.   Anesthesia type (None, local, MAC, general) ? Nitrous Oxide   Durel Salts 09/11/2020, 11:11 AM  _________________________________________________________________   (provider comments below)

## 2020-09-11 NOTE — Telephone Encounter (Signed)
Patient with diagnosis of afib on Xarelto for anticoagulation.    Procedure: fiducial marker spaceOAR Date of procedure: 10/15/20  CHA2DS2-VASc Score = 4  This indicates a 4.8% annual risk of stroke. The patient's score is based upon: CHF History: No HTN History: Yes Diabetes History: No Stroke History: No Vascular Disease History: Yes Age Score: 2 Gender Score: 0  CrCl 72mL/min using adjusted body weight Platelet count 231K  Per office protocol, patient can hold Xarelto for 3 days prior to procedure as requested.

## 2020-09-11 NOTE — Telephone Encounter (Signed)
CALLED PATIENT TO INFORM OF FID. MARKER AND SPACE OAR PLACEMENT ON 10-15-20 @ [facility] AND HIS SIM ON 10-20-20 - ARRIVAL TIME - 2:15 PM @ [place], LVM FOR A RETURN CALL

## 2020-09-14 ENCOUNTER — Other Ambulatory Visit: Payer: Self-pay

## 2020-09-14 ENCOUNTER — Ambulatory Visit (INDEPENDENT_AMBULATORY_CARE_PROVIDER_SITE_OTHER): Payer: PPO | Admitting: Otolaryngology

## 2020-09-14 ENCOUNTER — Other Ambulatory Visit: Payer: Self-pay | Admitting: Family Medicine

## 2020-09-14 VITALS — Temp 97.2°F

## 2020-09-14 DIAGNOSIS — H6122 Impacted cerumen, left ear: Secondary | ICD-10-CM

## 2020-09-14 DIAGNOSIS — H6982 Other specified disorders of Eustachian tube, left ear: Secondary | ICD-10-CM

## 2020-09-14 DIAGNOSIS — H9012 Conductive hearing loss, unilateral, left ear, with unrestricted hearing on the contralateral side: Secondary | ICD-10-CM | POA: Diagnosis not present

## 2020-09-14 DIAGNOSIS — H73892 Other specified disorders of tympanic membrane, left ear: Secondary | ICD-10-CM | POA: Diagnosis not present

## 2020-09-14 NOTE — Progress Notes (Signed)
HPI: Tyler Allison is a 80 y.o. male who returns today for evaluation of blockage mostly of the left ear.  He has had previous mastoid surgery and tympanoplasty in 2005.  He has had chronic retraction pocket posteriorly on the left side that is gradually gotten worse.  He is always had a conductive loss on the left side.  He has had no drainage or pain from the ear. He was recently diagnosed with prostate cancer and scheduled to get radiation therapy in July and August.  Past Medical History:  Diagnosis Date  . Allergy   . Asthma   . Cancer [facility])    prostate cancer  . Cervical spondylosis with radiculopathy    left c6 radiculopathy  . Chronic back pain   . GERD (gastroesophageal reflux disease)   . Hyperlipidemia   . Hypertension   . Prostate cancer [facility])    Past Surgical History:  Procedure Laterality Date  . PROSTATE BIOPSY    . SPINE SURGERY     back surgery x 3 including lumbar fusion, neck surgery x 2 including C4-6 ACDF  . tympanoplasty with mastoidectomy Left 01/13/2004   Social History   Socioeconomic History  . Marital status: Married    Spouse name: Not on file  . Number of children: 2  . Years of education: Not on file  . Highest education level: Not on file  Occupational History    Comment: retired  Tobacco Use  . Smoking status: Former Smoker    Packs/day: 0.50    Years: 13.00    Pack years: 6.50    Types: Cigarettes    Start date: 76    Quit date: 10/23/1968    Years since quitting: 51.9  . Smokeless tobacco: Never Used  Vaping Use  . Vaping Use: Never used  Substance and Sexual Activity  . Alcohol use: No  . Drug use: No  . Sexual activity: Not Currently    Comment: married, retired.  Other Topics Concern  . Not on file  Social History Narrative   2 children but 1 deceased   Social Determinants of Health   Financial Resource Strain: Not on file  Food Insecurity: Not on file  Transportation Needs: Not on file  Physical Activity: Not on  file  Stress: Not on file  Social Connections: Not on file   Family History  Problem Relation Age of Onset  . Heart failure Mother   . Esophageal cancer Father   . Heart attack Maternal Uncle   . Breast cancer Neg Hx   . Prostate cancer Neg Hx   . Pancreatic cancer Neg Hx   . Colon cancer Neg Hx    Allergies  Allergen Reactions  . Codeine Nausea And Vomiting   Prior to Admission medications   Medication Sig Start Date End Date Taking? Authorizing Provider  albuterol (PROAIR HFA) 108 (90 BASE) MCG/ACT inhaler Inhale 2 puffs into the lungs every 4 (four) hours as needed. 10/31/12   [name], MD  atorvastatin (LIPITOR) 40 MG tablet TAKE 1 TABLET(40 MG) BY MOUTH DAILY 09/14/20   [name], MD  clonazePAM (KLONOPIN) 0.5 MG tablet TAKE 1 TABLET(0.5 MG) BY MOUTH TWICE DAILY AS NEEDED FOR ANXIETY 10/25/19   [name], MD  fluticasone [facility]) 50 MCG/ACT nasal spray USE 2 SPRAYS IN [facility] NOSTRIL EVERY DAY AT NIGHT 06/08/20 07/08/20  Rozetta Nunnery, MD  metoprolol succinate (TOPROL-XL) 25 MG 24 hr tablet Take 1 tablet by mouth daily  03/13/20   [name], MD  Multiple Vitamins-Minerals (VISION FORMULA PO) Take by mouth. AERDS 2    [provider]  pantoprazole (PROTONIX) 40 MG tablet TAKE 1 TABLET(40 MG) BY MOUTH TWICE DAILY 01/23/20   [name], MD  rivaroxaban (XARELTO) 20 MG TABS tablet Take 1 tablet by mouth daily with supper 07/14/20   Jettie Booze, MD  tamsulosin (FLOMAX) 0.4 MG CAPS capsule Take 1 capsule (0.4 mg total) by mouth daily. 04/19/17   [name], MD  traMADol (ULTRAM) 50 MG tablet Take by mouth every 6 (six) hours as needed.    [provider]     Positive ROS: Otherwise negative  All other systems have been reviewed and were otherwise negative with the exception of those mentioned in the HPI and as above.  Physical Exam: Constitutional: Alert, well-appearing, no acute distress Ears: External ears  without lesions or tenderness.  Right ear canal and right TM are clear.  Left ear canal and TM reveal dried wax and a large retraction pocket and probable cholesteatoma involving the incus and stapes region.  There is no granulation tissue and no active drainage noted.  On tuning fork testing Weber lateralized to the left side and BC was graded AC on the left side.  AC > BC on the right side. Nasal: External nose without lesions. Septum slightly deviated to the right.. Clear nasal passages otherwise. Oral: Lips and gums without lesions. Tongue and palate mucosa without lesions. Posterior oropharynx clear. Neck: No palpable adenopathy or masses Respiratory: Breathing comfortably  Skin: No facial/neck lesions or rash noted.  Cerumen impaction removal  Date/Time: 09/14/2020 5:26 PM Performed by: Rozetta Nunnery, MD Authorized by: Rozetta Nunnery, MD   Consent:    Consent obtained:  Verbal   Consent given by:  Patient   Risks discussed:  Pain and bleeding Procedure details:    Location:  L ear   Procedure type: forceps   Post-procedure details:    Inspection:  TM intact and canal normal   Hearing quality:  Improved   Patient tolerance of procedure:  Tolerated well, no immediate complications Comments:     Patient with dried wax and skin in the left ear canal that was removed with forceps.  Of note he has a large posterior retraction pocket draped over the incus and stapes with partial erosion.  A little bit of debris within the retraction pocket but no granulation tissue and no evidence of infection.  Probable cholesteatoma.    Assessment: Left ear canal was cleaned in the office today.  Patient with a retraction pocket on the left side with chronic left eustachian tube dysfunction.  In addition to conductive hearing loss on the left.  Plan: He might benefit by surgical intervention. He will follow-up in 4 to 5 months for recheck and audiologic testing. He is not interested  in doing anything until he completes his radiation therapy for his prostate cancer that was recently diagnosed.   Radene Journey, MD

## 2020-09-14 NOTE — Telephone Encounter (Signed)
   Name: Tyler Allison  DOB: 03/06/1941  MRN: [id number]   Primary Cardiologist: Larae Grooms, MD  Chart reviewed as part of pre-operative protocol coverage. Patient was contacted 09/14/2020 in reference to pre-operative risk assessment for pending surgery as outlined below.  Tyler Allison was last seen on 08/18/20 by Dr. Irish Lack.  Since that day, Tyler Allison has done well. He has been exercising using recumbent bike with exercise tolerance >4 METS.   Therefore, [name], the patient would be at acceptable risk for the planned procedure without further cardiovascular testing.   Per pharmacy review and office protocols may hold Xarelto 3 days prior to planned procedure. He verbalized understanding of these instructions.   The patient was advised that if he develops new symptoms prior to surgery to contact our office to arrange for a follow-up visit, and he verbalized understanding.  I will route this recommendation to the requesting party via Epic fax function and remove from pre-op pool. Please call with questions.  Loel Dubonnet, NP 09/14/2020, 11:08 AM

## 2020-09-15 ENCOUNTER — Telehealth: Payer: Self-pay | Admitting: Pharmacist

## 2020-09-15 ENCOUNTER — Encounter: Payer: PPO | Admitting: Physical Therapy

## 2020-09-15 NOTE — Progress Notes (Addendum)
Chronic Care Management Pharmacy Assistant   Name: Tyler Allison  MRN: [id number] DOB: 06-Apr-1941  Tyler Allison is an 80 y.o. year old male who presents for his initial CCM visit with the clinical pharmacist.  Reason for Encounter: Chart Prep   Conditions to be addressed/monitored: Afib, HTN, Asthma, GERD, HLD, Depression.  Primary concerns for visit include: HTN   Recent office visits:  06/08/20 (Telemedicine) [name], NP. Exposure to COVID-19 Virus.  Dr. Dennard Schaumann For follow-Up. Labs drawn. STARTED Prednisone 20 mg pack.   Recent consult visits: 09/14/20 Otolaryngology Melony Overly, E, MD. No medication changes. 09/03/20 [place] Trena Platt D. For Cervical radiculopathy 08/18/20 Cardiology Jettie Booze, MD. For follow-Up, No medication changes.   08/11/20 Pulmonology Deneise Lever, MD. For follow-Up. Per note: Overnight study. No medication changes. 08/04/20 [place]. Meyran NP, Leatha Gilding MD, Camelia Eng 07/28/20 Optalmology Jalene Mullet. No information given. 07/22/20 Opthalmology Jalene Mullet. No information given. 07/14/20 [place]. Meyran NP, Leatha Gilding MD, Camelia Eng 07/06/20 Urology Raynelle Bring. No information given. 07/06/20 Pathology Casimer Lanius, MD. No information given. 06/17/20 Opthalmology Jalene Mullet. No information given. 06/02/20 Opthalmology Jalene Mullet. No information given. 05/15/20 Urology Raynelle Bring No information given. 04/14/20 Opthalmology Jalene Mullet. No information given. 04/07/20 Opthalmology Jalene Mullet. No information given. 03/26/20 Otolaryngology Melony Overly, E, MD. No medication changes.  03/25/20 Opthalmology Jalene Mullet. No information given.  Hospital visits:  None in previous 6 months  Medication History:  Medications: Outpatient Encounter Medications as of 09/15/2020  Medication Sig   albuterol  (PROAIR HFA) 108 (90 BASE) MCG/ACT inhaler Inhale 2 puffs into the lungs every 4 (four) hours as needed.   atorvastatin (LIPITOR) 40 MG tablet TAKE 1 TABLET(40 MG) BY MOUTH DAILY   clonazePAM (KLONOPIN) 0.5 MG tablet TAKE 1 TABLET(0.5 MG) BY MOUTH TWICE DAILY AS NEEDED FOR ANXIETY   fluticasone (FLONASE) 50 MCG/ACT nasal spray USE 2 SPRAYS IN EACH NOSTRIL EVERY DAY AT NIGHT   metoprolol succinate (TOPROL-XL) 25 MG 24 hr tablet Take 1 tablet by mouth daily   Multiple Vitamins-Minerals (VISION FORMULA PO) Take by mouth. AERDS 2   pantoprazole (PROTONIX) 40 MG tablet TAKE 1 TABLET(40 MG) BY MOUTH TWICE DAILY   rivaroxaban (XARELTO) 20 MG TABS tablet Take 1 tablet by mouth daily with supper   tamsulosin (FLOMAX) 0.4 MG CAPS capsule Take 1 capsule (0.4 mg total) by mouth daily.   traMADol (ULTRAM) 50 MG tablet Take by mouth every 6 (six) hours as needed.   No facility-administered encounter medications on file as of 09/15/2020.    Have you seen any other providers since your last visit? Patient stated no.  Any changes in your medications or health? Patient stated no.  Any side effects from any medications? Patient stated no.  Do you have an symptoms or problems not managed by your medications? Patient stated no.  Any concerns about your health right now? Patient stated he is concerned about his radiation for prostate cancer.   Has your provider asked that you check blood pressure, blood sugar, or follow special diet at home? Patient stated no.    Do you get any type of exercise on a regular basis?  Patient stated he uses his stationary bike about 3-4  Days for 2-3 min at a time and do it a couple times day.   Can you think of a goal you would like to reach for  your health? Patient stated improving his SOB, finishing his radiation.  Do you have any problems getting your medications? Patient stared his Xarelto is very expensive and he goes into a donut hole.   Is there anything that you would  like to discuss during the appointment? Patient stated no.    Please bring medications and supplements to appointment, Patient reminded of his OTP appointment on 09/18/20 on 2 pm.  Follow-Up:Pharmacist Review  [name], Branch Pharmacist Assistant [phone]

## 2020-09-15 NOTE — Progress Notes (Signed)
Chronic Care Management Pharmacy Note  09/17/2020 Name:  Tyler Allison MRN:  [id number] DOB:  1941-01-09  Subjective: Tyler Allison is an 80 y.o. year old male who is a primary patient of Pickard, Cammie Mcgee, MD.  The CCM team was consulted for assistance with disease management and care coordination needs.    Engaged with patient by telephone for initial visit in response to provider referral for pharmacy case management and/or care coordination services.   Consent to Services:  The patient was given the following information about Chronic Care Management services today, agreed to services, and gave verbal consent: 1. CCM service includes personalized support from designated clinical staff supervised by the primary care provider, including individualized plan of care and coordination with other care providers 2. 24/7 contact phone numbers for assistance for urgent and routine care needs. 3. Service will only be billed when office clinical staff spend 20 minutes or more in a month to coordinate care. 4. Only one practitioner may furnish and bill the service in a calendar month. 5.The patient may stop CCM services at any time (effective at the end of the month) by phone call to the office staff. 6. The patient will be responsible for cost sharing (co-pay) of up to 20% of the service fee (after annual deductible is met). Patient agreed to services and consent obtained.  Patient Care Team: [name], MD as PCP - General (Family Medicine) Jettie Booze, MD as PCP - Cardiology (Cardiology) Edythe Clarity, [facility] as Pharmacist (Pharmacist)  Recent office visits: 06/08/20 (Telemedicine) [name], NP. Exposure to COVID-19 Virus.  Dr. Dennard Schaumann For follow-Up. Labs drawn. STARTED Prednisone 20 mg pack.  Recent consult visits: 09/14/20 Otolaryngology Melony Overly, E, MD. No medication changes. 09/03/20 [place] Trena Platt D. For Cervical radiculopathy 08/18/20  Cardiology Jettie Booze, MD. For follow-Up, No medication changes.   08/11/20 Pulmonology Deneise Lever, MD. For follow-Up. Per note: Overnight study. No medication changes. 08/04/20 [place]. Meyran NP, Leatha Gilding MD, Camelia Eng 07/28/20 Optalmology Jalene Mullet. No information given. 07/22/20 Opthalmology Jalene Mullet. No information given. 07/14/20 [place]. Meyran NP, Leatha Gilding MD, [facility] visits: None in previous 6 months  Objective:  Lab Results  Component Value Date   CREATININE 0.97 03/23/2020   BUN 17 03/23/2020   GFR 74.56 07/03/2019   GFRNONAA 73 03/23/2020   GFRAA 85 03/23/2020   NA 137 03/23/2020   K 4.5 03/23/2020   CALCIUM 9.1 03/23/2020   CO2 26 03/23/2020   GLUCOSE 83 03/23/2020    Lab Results  Component Value Date/Time   GFR 74.56 07/03/2019 12:06 PM    Last diabetic Eye exam: No results found for: HMDIABEYEEXA  Last diabetic Foot exam: No results found for: HMDIABFOOTEX   Lab Results  Component Value Date   CHOL 141 03/23/2020   HDL 51 03/23/2020   LDLCALC 69 03/23/2020   TRIG 120 03/23/2020   CHOLHDL 2.8 03/23/2020    Hepatic Function Latest Ref Rng & Units 03/23/2020 12/03/2019 08/28/2018  Total Protein 6.1 - 8.1 g/dL 6.8 6.7 7.7  Albumin 3.7 - 4.7 g/dL - - 4.5  AST 10 - 35 U/L _0 ALT 9 - 46 U/L _1 Alk Phosphatase 39 - 117 IU/L - - 94  Total Bilirubin 0.2 - 1.2 mg/dL 0.6 1.0 0.6    Lab Results  Component Value Date/Time  TSH 2.18 02/11/2016 12:30 PM   TSH 5.442 (H) 03/13/2015 08:16 AM    CBC Latest Ref Rng & Units 03/23/2020 12/03/2019 01/15/2019  WBC 3.8 - 10.8 Thousand/uL 7.2 6.5 9.1  Hemoglobin 13.2 - 17.1 g/dL 14.6 14.4 14.5  Hematocrit 38.5 - 50.0 % 42.8 42.4 43.1  Platelets 140 - 400 Thousand/uL 231 252 232.0    No results found for: VD25OH  Clinical ASCVD: Yes  The ASCVD Risk score Mikey Bussing DC Jr., et al., 2013) failed  to calculate for the following reasons:   The 2013 ASCVD risk score is only valid for ages 56 to 38    Depression screen PHQ 2/9 03/23/2020 02/01/2018 11/30/2017  Decreased Interest 0 0 0  Down, Depressed, Hopeless 0 0 0  PHQ - 2 Score 0 0 0     Social History   Tobacco Use  Smoking Status Former Smoker  . Packs/day: 0.50  . Years: 13.00  . Pack years: 6.50  . Types: Cigarettes  . Start date: 44  . Quit date: 10/23/1968  . Years since quitting: 51.9  Smokeless Tobacco Never Used   BP Readings from Last 3 Encounters:  08/18/20 114/78  08/11/20 140/80  03/23/20 108/70   Pulse Readings from Last 3 Encounters:  08/18/20 (!) 57  08/11/20 68  03/23/20 (!) 50   Wt Readings from Last 3 Encounters:  08/31/20 206 lb (93.4 kg)  08/18/20 206 lb 9.6 oz (93.7 kg)  08/11/20 206 lb 6.4 oz (93.6 kg)   BMI Readings from Last 3 Encounters:  08/31/20 29.14 kg/m  08/18/20 29.23 kg/m  08/11/20 29.20 kg/m    Assessment/Interventions: Review of patient past medical history, allergies, medications, health status, including review of consultants reports, laboratory and other test data, was performed as part of comprehensive evaluation and provision of chronic care management services.   SDOH:  (Social Determinants of Health) assessments and interventions performed: Yes   Financial Resource Strain: Low Risk   . Difficulty of Paying Living Expenses: Not very hard    SDOH Screenings   Alcohol Screen: Not on file  Depression (PHQ2-9): Low Risk   . PHQ-2 Score: 0  Financial Resource Strain: Low Risk   . Difficulty of Paying Living Expenses: Not very hard  Food Insecurity: Not on file  Housing: Not on file  Physical Activity: Not on file  Social Connections: Not on file  Stress: Not on file  Tobacco Use: Medium Risk  . Smoking Tobacco Use: Former Smoker  . Smokeless Tobacco Use: Never Used  Transportation Needs: Not on file    CCM Care Plan  Allergies  Allergen Reactions  .  Codeine Nausea And Vomiting    Medications Reviewed Today    Reviewed by Tyler Pita, MD (Physician) on 08/31/20 at 1101  Med List Status: <None>  Medication Order Taking? Sig Documenting Provider Last Dose Status Informant  albuterol (PROAIR HFA) 108 (90 BASE) MCG/ACT inhaler [id number] Yes Inhale 2 puffs into the lungs every 4 (four) hours as needed. [name], MD Taking Active   atorvastatin (LIPITOR) 40 MG tablet [id number] Yes TAKE 1 TABLET(40 MG) BY MOUTH DAILY [name], MD Taking Active   clonazePAM (KLONOPIN) 0.5 MG tablet [id number] Yes TAKE 1 TABLET(0.5 MG) BY MOUTH TWICE DAILY AS NEEDED FOR ANXIETY [name], MD Taking Active   fluticasone (FLONASE) 50 MCG/ACT nasal spray [id number] Yes USE 2 SPRAYS IN [facility] NOSTRIL EVERY DAY AT Elie Confer, MD Taking Expired 07/08/20 2359  metoprolol succinate (TOPROL-XL) 25 MG 24 hr tablet [id number] Yes Take 1 tablet by mouth daily [name], MD Taking Active   Multiple Vitamins-Minerals (VISION FORMULA PO) [id number] Yes Take by mouth. AERDS 2 [provider] Taking Active   pantoprazole (PROTONIX) 40 MG tablet [id number] Yes TAKE 1 TABLET(40 MG) BY MOUTH TWICE DAILY Pickard, Cammie Mcgee, MD Taking Active   rivaroxaban (XARELTO) 20 MG TABS tablet [id number] Yes Take 1 tablet by mouth daily with supper Jettie Booze, MD Taking Active   tamsulosin [facility]) 0.4 MG CAPS capsule [id number] Yes Take 1 capsule (0.4 mg total) by mouth daily. [name], MD Taking Active   traMADol Veatrice Bourbon) 50 MG tablet [id number] Yes Take by mouth every 6 (six) hours as needed. [provider] Taking Active           Patient Active Problem List   Diagnosis Date Noted  . Aortic atherosclerosis ([place]) 09/11/2020  . Hypertension 09/11/2020  . Close exposure to COVID-19 virus 06/08/2020  . Central sleep apnea 02/07/2020  . Nocturnal hypoxemia 03/01/2019  . GERD (gastroesophageal reflux disease)  10/24/2018  . Paroxysmal atrial fibrillation ([place]) 07/01/2016  . Hyperlipidemia 07/01/2016  . Cervical spondylosis with radiculopathy   . Asthmatic bronchitis, moderate persistent, uncomplicated 98/33/8250  . Chronic back pain   . ADVERSE DRUG REACTION 08/24/2009  . Asthma 08/19/2009  . PULMONARY NODULE 08/19/2009  . COUGH 08/19/2009  . PURE HYPERCHOLESTEROLEMIA 04/24/2009    Immunization History  Administered Date(s) Administered  . Fluad Quad(high Dose 65+) 12/28/2018, 01/30/2020  . Influenza Whole 02/09/2010  . Influenza, High Dose Seasonal PF 02/15/2017, 02/01/2018  . Influenza,inj,Quad PF,6+ Mos 02/13/2013, 02/25/2014, 03/13/2015, 02/11/2016  . Influenza,inj,quad, With Preservative 02/06/2018  . PFIZER(Purple Top)SARS-COV-2 Vaccination 06/20/2019, 07/13/2019, 05/11/2020  . Pneumococcal Conjugate-13 03/14/2014  . Pneumococcal Polysaccharide-23 05/09/2004, 12/05/2017  . Td 01/07/2005  . Zoster 08/08/2006    Conditions to be addressed/monitored:  Afib, HTN, Asthma, GERD, HLD, Back Pain, Aortic Atherosclerosis  Care Plan : General Pharmacy (Adult)  Updates made by Edythe Clarity, RPH since 09/17/2020 12:00 AM    Problem: Afib, HTN, Asthma, GERD, HLD, Back Pain, Aortic Atherosclerosis   Priority: High  Onset Date: 09/17/2020    Long-Range Goal: Patient-Specific Goal   Start Date: 09/17/2020  Expected End Date: 03/20/2021  This Visit's Progress: On track  Priority: High  Note:   Current Barriers:  . Unable to independently afford treatment regimen  Pharmacist Clinical Goal(s):  Marland Kitchen Patient will verbalize ability to afford treatment regimen . maintain control of blood pressure and cholesterol as evidenced by monitoring and labs  . adhere to prescribed medication regimen as evidenced by fill dates . contact provider office for questions/concerns as evidenced notation of same in electronic health record through collaboration with PharmD and provider.    Interventions: . 1:1 collaboration with [name], MD regarding development and update of comprehensive plan of care as evidenced by provider attestation and co-signature . Inter-disciplinary care team collaboration (see longitudinal plan of care) . Comprehensive medication review performed; medication list updated in electronic medical record  Hypertension (BP goal <140/90) -Controlled -Current treatment: . Metoprolol XL 56m daily -Medications previously tried: none noted  -Current home readings: not checking, controlled in office  -Current exercise habits: stationary bike 2-3 minutes daily for a few times per day -Denies hypotensive/hypertensive symptoms -Educated on BP goals and benefits of medications for prevention of heart attack, stroke and kidney damage; Exercise goal of 150 minutes per week; Importance of  home blood pressure monitoring; Symptoms of hypotension and importance of maintaining adequate hydration; -Counseled to monitor BP at home as able or if symptomatic, document, and provide log at future appointments -Recommended to continue current medication  Hyperlipidemia/Aortic Atherosclerosis: (LDL goal < 70) -Controlled -Current treatment: . Atorvastatin 35m daily -Medications previously tried: none noted   -Educated on Cholesterol goals;  Benefits of statin for ASCVD risk reduction; Importance of limiting foods high in cholesterol;  -Reviewed most recent lipid panel with patient, congratulated him on great results -Recommended to continue current medication  Atrial Fibrillation (Goal: prevent stroke and major bleeding) -Controlled -CHADSVASC: 4 (age x2, HTN, CAD). Renal function is normal.  -Current treatment: . Rate control: Metoprolol XL 219mdaily . Anticoagulation: Xarelto 2042maily -Medications previously tried: none ntoed -Home BP and HR readings: controlled in office -Denies any recent chest pains or irregular heart beat  -Counseled on  increased risk of stroke due to Afib and benefits of anticoagulation for stroke prevention; importance of adherence to anticoagulant exactly as prescribed; bleeding risk associated with Xarelto and importance of self-monitoring for signs/symptoms of bleeding; avoidance of NSAIDs due to increased bleeding risk with anticoagulants; -Recommended to continue current medication Assessed patient finances. He reports Xarelto copay is sometimes an issue.  Income has become much lower this year than in the past.  Will mail JJPAF patient assistance program for patient to complete.  Asthma (Goal: control symptoms and prevent exacerbations) -Controlled -Current treatment  . Albuterol HFA 70m62mrn -Medications previously tried: Anoro -Exacerbations requiring treatment in last 6 months: none -Patient denies consistent use of maintenance inhaler - does not have one, or need it -Frequency of rescue inhaler use: as needed - very rarely -Counseled on Proper inhaler technique; When to use rescue inhaler -Recommended to continue current medication  Chronic Back Pain (Goal: Control pain) -Controlled -Current treatment  . Tramadol 50mg38medications previously tried: Advil - no longer taking d/t Xarelto  -Counseled on avoidance of NSAIDs to control pain with Xarelto therapy -Recommended to continue current medication  GERD (Goal: Minimize symptoms) -Controlled -Current treatment  . Pantoprazole 40mg 77m-Medications previously tried: none noted -Takes appropriately - denies any recent symptoms -Working on trigger foods -Recommended to continue current medication   Patient Goals/Self-Care Activities . Patient will:  - take medications as prescribed focus on medication adherence by pill box collaborate with provider on medication access solutions target a minimum of 150 minutes of moderate intensity exercise weekly  Follow Up Plan: The care management team will reach out to the patient again  over the next 180 days.         Medication Assistance: Application for Xarelto  medication assistance program. in process.  Anticipated assistance start date unknown.  See plan of care for additional detail.  Patient's preferred pharmacy is:  [facility] [address] [address]: [phone]-[address]: [phone])[facility] [place] [address] [address]: [phone] pill box? Yes Pt endorses 100% compliance  We discussed: Benefits of medication synchronization, packaging and delivery as well as enhanced pharmacist oversight with [employer]. Patient decided to: Continue current medication management strategy  Care Plan and Follow Up Patient Decision:  Patient agrees to Care Plan and Follow-up.  Plan: The care management team will reach out to the patient again over the next 180 days.  [name], PharmD Clinical Pharmacist [place] [phone]

## 2020-09-17 ENCOUNTER — Ambulatory Visit (INDEPENDENT_AMBULATORY_CARE_PROVIDER_SITE_OTHER): Payer: PPO | Admitting: Pharmacist

## 2020-09-17 DIAGNOSIS — E782 Mixed hyperlipidemia: Secondary | ICD-10-CM | POA: Diagnosis not present

## 2020-09-17 DIAGNOSIS — M5412 Radiculopathy, cervical region: Secondary | ICD-10-CM | POA: Diagnosis not present

## 2020-09-17 DIAGNOSIS — I480 Paroxysmal atrial fibrillation: Secondary | ICD-10-CM | POA: Diagnosis not present

## 2020-09-17 DIAGNOSIS — M549 Dorsalgia, unspecified: Secondary | ICD-10-CM

## 2020-09-17 DIAGNOSIS — I10 Essential (primary) hypertension: Secondary | ICD-10-CM | POA: Diagnosis not present

## 2020-09-17 DIAGNOSIS — I700 Atherosclerosis of aorta: Secondary | ICD-10-CM

## 2020-09-17 DIAGNOSIS — G8929 Other chronic pain: Secondary | ICD-10-CM

## 2020-09-17 DIAGNOSIS — K219 Gastro-esophageal reflux disease without esophagitis: Secondary | ICD-10-CM

## 2020-09-17 NOTE — Patient Instructions (Addendum)
Visit Information  Goals Addressed            This Visit's Progress   . Stay Active and Independent-Low Back Pain       Timeframe:  Long-Range Goal Priority:  High Start Date:     09/17/20                        Expected End Date: 03/20/21                      Follow Up Date 12/18/20    - use fitness equipment at home - walk indoors - walk outside    Why is this important?    Regular activity or exercise is important to managing back pain.   Activity helps to keep your muscles strong.   You will sleep better and feel more relaxed.   You will have more energy and feel less stressed.   If you are not active now, start slowly. Little changes make a big difference.   Rest, but not too much.   Stay as active as you can and listen to your body's signals.     Notes:       Patient Care Plan: General Pharmacy (Adult)    Problem Identified: Afib, HTN, Asthma, GERD, HLD, Back Pain, Aortic Atherosclerosis   Priority: High  Onset Date: 09/17/2020    Long-Range Goal: Patient-Specific Goal   Start Date: 09/17/2020  Expected End Date: 03/20/2021  This Visit's Progress: On track  Priority: High  Note:   Current Barriers:  . Unable to independently afford treatment regimen  Pharmacist Clinical Goal(s):  Marland Kitchen Patient will verbalize ability to afford treatment regimen . maintain control of blood pressure and cholesterol as evidenced by monitoring and labs  . adhere to prescribed medication regimen as evidenced by fill dates . contact provider office for questions/concerns as evidenced notation of same in electronic health record through collaboration with PharmD and provider.   Interventions: . 1:1 collaboration with [name], MD regarding development and update of comprehensive plan of care as evidenced by provider attestation and co-signature . Inter-disciplinary care team collaboration (see longitudinal plan of care) . Comprehensive medication review performed;  medication list updated in electronic medical record  Hypertension (BP goal <140/90) -Controlled -Current treatment: . Metoprolol XL 25mg  daily -Medications previously tried: none noted  -Current home readings: not checking, controlled in office  -Current exercise habits: stationary bike 2-3 minutes daily for a few times per day -Denies hypotensive/hypertensive symptoms -Educated on BP goals and benefits of medications for prevention of heart attack, stroke and kidney damage; Exercise goal of 150 minutes per week; Importance of home blood pressure monitoring; Symptoms of hypotension and importance of maintaining adequate hydration; -Counseled to monitor BP at home as able or if symptomatic, document, and provide log at future appointments -Recommended to continue current medication  Hyperlipidemia/Aortic Atherosclerosis: (LDL goal < 70) -Controlled -Current treatment: . Atorvastatin 40mg  daily -Medications previously tried: none noted   -Educated on Cholesterol goals;  Benefits of statin for ASCVD risk reduction; Importance of limiting foods high in cholesterol;  -Reviewed most recent lipid panel with patient, congratulated him on great results -Recommended to continue current medication  Atrial Fibrillation (Goal: prevent stroke and major bleeding) -Controlled -CHADSVASC: 4 (age x2, HTN, CAD). Renal function is normal.  -Current treatment: . Rate control: Metoprolol XL 25mg  daily . Anticoagulation: Xarelto 20mg  daily -Medications previously tried: none ntoed -Home BP and  HR readings: controlled in office -Denies any recent chest pains or irregular heart beat  -Counseled on increased risk of stroke due to Afib and benefits of anticoagulation for stroke prevention; importance of adherence to anticoagulant exactly as prescribed; bleeding risk associated with Xarelto and importance of self-monitoring for signs/symptoms of bleeding; avoidance of NSAIDs due to increased bleeding  risk with anticoagulants; -Recommended to continue current medication Assessed patient finances. He reports Xarelto copay is sometimes an issue.  Income has become much lower this year than in the past.  Will mail [employer] patient assistance program for patient to complete.  Asthma (Goal: control symptoms and prevent exacerbations) -Controlled -Current treatment  . Albuterol HFA 73mcg prn -Medications previously tried: Anoro -Exacerbations requiring treatment in last 6 months: none -Patient denies consistent use of maintenance inhaler - does not have one, or need it -Frequency of rescue inhaler use: as needed - very rarely -Counseled on Proper inhaler technique; When to use rescue inhaler -Recommended to continue current medication  Chronic Back Pain (Goal: Control pain) -Controlled -Current treatment  . Tramadol 50mg   -Medications previously tried: Advil - no longer taking d/t Xarelto  -Counseled on avoidance of NSAIDs to control pain with Xarelto therapy -Recommended to continue current medication  GERD (Goal: Minimize symptoms) -Controlled -Current treatment  . Pantoprazole 40mg  BID -Medications previously tried: none noted -Takes appropriately - denies any recent symptoms -Working on trigger foods -Recommended to continue current medication   Patient Goals/Self-Care Activities . Patient will:  - take medications as prescribed focus on medication adherence by pill box collaborate with provider on medication access solutions target a minimum of 150 minutes of moderate intensity exercise weekly  Follow Up Plan: The care management team will reach out to the patient again over the next 180 days.        Tyler Allison was given information about Chronic Care Management services today including:  1. CCM service includes personalized support from designated clinical staff supervised by his physician, including individualized plan of care and coordination with other care  providers 2. 24/7 contact phone numbers for assistance for urgent and routine care needs. 3. Standard insurance, coinsurance, copays and deductibles apply for chronic care management only during months in which we provide at least 20 minutes of these services. Most insurances cover these services at 100%, however patients may be responsible for any copay, coinsurance and/or deductible if applicable. This service may help you avoid the need for more expensive face-to-face services. 4. Only one practitioner may furnish and bill the service in a calendar month. 5. The patient may stop CCM services at any time (effective at the end of the month) by phone call to the office staff.  Patient agreed to services and verbal consent obtained.   The patient verbalized understanding of instructions, educational materials, and care plan provided today and agreed to receive a mailed copy of patient instructions, educational materials, and care plan.  Telephone follow up appointment with pharmacy team member scheduled for: 6 months  Tyler Allison, [facility]  Atrial Fibrillation  Atrial fibrillation is a type of irregular or rapid heartbeat (arrhythmia). In atrial fibrillation, the top part of the heart (atria) beats in an irregular pattern. This makes the heart unable to pump blood normally and effectively. The goal of treatment is to prevent blood clots from forming, control your heart rate, or restore your heartbeat to a normal rhythm. If this condition is not treated, it can cause serious problems, such as a weakened heart muscle (  cardiomyopathy) or a stroke. What are the causes? This condition is often caused by medical conditions that damage the heart's electrical system. These include:  High blood pressure (hypertension). This is the most common cause.  Certain heart problems or conditions, such as heart failure, coronary artery disease, heart valve problems, or heart surgery.  Diabetes.  Overactive  thyroid (hyperthyroidism).  Obesity.  Chronic kidney disease. In some cases, the cause of this condition is not known. What increases the risk? This condition is more likely to develop in:  Older people.  People who smoke.  Athletes who do endurance exercise.  People who have a family history of atrial fibrillation.  Men.  People who use drugs.  People who drink a lot of alcohol.  People who have lung conditions, such as emphysema, pneumonia, or COPD.  People who have obstructive sleep apnea. What are the signs or symptoms? Symptoms of this condition include:  A feeling that your heart is racing or beating irregularly.  Discomfort or pain in your chest.  Shortness of breath.  Sudden light-headedness or weakness.  Tiring easily during exercise or activity.  Fatigue.  Syncope (fainting).  Sweating. In some cases, there are no symptoms. How is this diagnosed? Your health care provider may detect atrial fibrillation when taking your pulse. If detected, this condition may be diagnosed with:  An electrocardiogram (ECG) to check electrical signals of the heart.  An ambulatory cardiac monitor to record your heart's activity for a few days.  A transthoracic echocardiogram (TTE) to create pictures of your heart.  A transesophageal echocardiogram (TEE) to create even closer pictures of your heart.  A stress test to check your blood supply while you exercise.  Imaging tests, such as a CT scan or chest X-ray.  Blood tests. How is this treated? Treatment depends on underlying conditions and how you feel when you experience atrial fibrillation. This condition may be treated with:  Medicines to prevent blood clots or to treat heart rate or heart rhythm problems.  Electrical cardioversion to reset the heart's rhythm.  A pacemaker to correct abnormal heart rhythm.  Ablation to remove the heart tissue that sends abnormal signals.  Left atrial appendage closure to  seal the area where blood clots can form. In some cases, underlying conditions will be treated. Follow these instructions at home: Medicines  Take over-the counter and prescription medicines only as told by your health care provider.  Do not take any new medicines without talking to your health care provider.  If you are taking blood thinners: ? Talk with your health care provider before you take any medicines that contain aspirin or NSAIDs, such as ibuprofen. These medicines increase your risk for dangerous bleeding. ? Take your medicine exactly as told, at the same time every day. ? Avoid activities that could cause injury or bruising, and follow instructions about how to prevent falls. ? Wear a medical alert bracelet or carry a card that lists what medicines you take. Lifestyle  Do not use any products that contain nicotine or tobacco, such as cigarettes, e-cigarettes, and chewing tobacco. If you need help quitting, ask your health care provider.  Eat heart-healthy foods. Talk with a dietitian to make an eating plan that is right for you.  Exercise regularly as told by your health care provider.  Do not drink alcohol.  Lose weight if you are overweight.  Do not use drugs, including cannabis.      General instructions  If you have obstructive sleep apnea,  manage your condition as told by your health care provider.  Do not use diet pills unless your health care provider approves. Diet pills can make heart problems worse.  Keep all follow-up visits as told by your health care provider. This is important. Contact a health care provider if you:  Notice a change in the rate, rhythm, or strength of your heartbeat.  Are taking a blood thinner and you notice more bruising.  Tire more easily when you exercise or do heavy work.  Have a sudden change in weight. Get help right away if you have:  Chest pain, abdominal pain, sweating, or weakness.  Trouble breathing.  Side  effects of blood thinners, such as blood in your vomit, stool, or urine, or bleeding that cannot stop.  Any symptoms of a stroke. "BE FAST" is an easy way to remember the main warning signs of a stroke: ? B - Balance. Signs are dizziness, sudden trouble walking, or loss of balance. ? E - Eyes. Signs are trouble seeing or a sudden change in vision. ? F - Face. Signs are sudden weakness or numbness of the face, or the face or eyelid drooping on one side. ? A - Arms. Signs are weakness or numbness in an arm. This happens suddenly and usually on one side of the body. ? S - Speech. Signs are sudden trouble speaking, slurred speech, or trouble understanding what people say. ? T - Time. Time to call emergency services. Write down what time symptoms started.  Other signs of a stroke, such as: ? A sudden, severe headache with no known cause. ? Nausea or vomiting. ? Seizure. These symptoms may represent a serious problem that is an emergency. Do not wait to see if the symptoms will go away. Get medical help right away. Call your local emergency services (911 in the [place] Do not drive yourself to the hospital.   Summary  Atrial fibrillation is a type of irregular or rapid heartbeat (arrhythmia).  Symptoms include a feeling that your heart is beating fast or irregularly.  You may be given medicines to prevent blood clots or to treat heart rate or heart rhythm problems.  Get help right away if you have signs or symptoms of a stroke.  Get help right away if you cannot catch your breath or have chest pain or pressure. This information is not intended to replace advice given to you by your health care provider. Make sure you discuss any questions you have with your health care provider. Document Revised: 10/17/2018 Document Reviewed: 10/17/2018 Elsevier Patient Education  [employer].

## 2020-09-22 DIAGNOSIS — H353211 Exudative age-related macular degeneration, right eye, with active choroidal neovascularization: Secondary | ICD-10-CM | POA: Diagnosis not present

## 2020-09-29 DIAGNOSIS — H353221 Exudative age-related macular degeneration, left eye, with active choroidal neovascularization: Secondary | ICD-10-CM | POA: Diagnosis not present

## 2020-09-30 ENCOUNTER — Telehealth: Payer: Self-pay | Admitting: Interventional Cardiology

## 2020-09-30 NOTE — Telephone Encounter (Signed)
Patient dropped off forms on 09/29/20. HIM called patient on 09/30/20 to inform him of the $29 fee and authorization needed to process the forms. Patient came into the office today to make payment and fill out authorization. Forms were placed in Dr. Hassell Done box to be completed. AO 09/30/20

## 2020-09-30 NOTE — Telephone Encounter (Signed)
Created in error

## 2020-09-30 NOTE — Telephone Encounter (Signed)
Called and left a message for the patient to inform him that HIM received his paperwork which was left with check-in. Informed the patient to give HIM a call back so that I may discuss the process and had some questions before moving forward with completion. AO 09/30/20

## 2020-09-30 NOTE — Telephone Encounter (Signed)
PT RETURNING PHONE CALL, EDU THAT THERE IS A $29 FEE TO PROCESS PAPERWORK AND ALSO SIGN WAIVER.

## 2020-10-02 ENCOUNTER — Encounter: Payer: Self-pay | Admitting: Medical Oncology

## 2020-10-02 ENCOUNTER — Telehealth: Payer: Self-pay

## 2020-10-02 NOTE — Telephone Encounter (Signed)
**Note De-Identified Kathren Scearce Obfuscation** The pts [place] pt asst application for Xarelto was left at the office. I have completed the provider page of the application and e-mailed all to the nurse working with Dr Irish Lack today so she can obtain his signature, date it and to fax all to [employer] at the fax number written on the cover letter or to place in the "to be faxed"  Box in Medical Records to be faxed.

## 2020-10-02 NOTE — Telephone Encounter (Signed)
Paperwork faxed °

## 2020-10-08 ENCOUNTER — Emergency Department (HOSPITAL_COMMUNITY)
Admission: EM | Admit: 2020-10-08 | Discharge: 2020-10-08 | Disposition: A | Discharge status: Home / Self Care | Payer: PPO | Attending: Emergency Medicine | Admitting: Emergency Medicine

## 2020-10-08 ENCOUNTER — Ambulatory Visit (INDEPENDENT_AMBULATORY_CARE_PROVIDER_SITE_OTHER): Payer: PPO | Admitting: Family Medicine

## 2020-10-08 ENCOUNTER — Other Ambulatory Visit: Payer: Self-pay

## 2020-10-08 VITALS — BP 118/68 | HR 76 | Temp 98.1°F | Resp 16 | Ht 70.5 in | Wt 201.0 lb

## 2020-10-08 DIAGNOSIS — R042 Hemoptysis: Secondary | ICD-10-CM

## 2020-10-08 DIAGNOSIS — Z8546 Personal history of malignant neoplasm of prostate: Secondary | ICD-10-CM | POA: Insufficient documentation

## 2020-10-08 DIAGNOSIS — J45909 Unspecified asthma, uncomplicated: Secondary | ICD-10-CM | POA: Diagnosis not present

## 2020-10-08 DIAGNOSIS — Z87891 Personal history of nicotine dependence: Secondary | ICD-10-CM | POA: Insufficient documentation

## 2020-10-08 DIAGNOSIS — S00512A Abrasion of oral cavity, initial encounter: Secondary | ICD-10-CM | POA: Insufficient documentation

## 2020-10-08 DIAGNOSIS — R059 Cough, unspecified: Secondary | ICD-10-CM | POA: Diagnosis not present

## 2020-10-08 DIAGNOSIS — X58XXXA Exposure to other specified factors, initial encounter: Secondary | ICD-10-CM | POA: Diagnosis not present

## 2020-10-08 DIAGNOSIS — S00502A Unspecified superficial injury of oral cavity, initial encounter: Secondary | ICD-10-CM | POA: Diagnosis present

## 2020-10-08 DIAGNOSIS — I10 Essential (primary) hypertension: Secondary | ICD-10-CM | POA: Diagnosis not present

## 2020-10-08 LAB — CBC WITH DIFFERENTIAL/PLATELET
Abs Immature Granulocytes: 0.01 10*3/uL (ref 0.00–0.07)
Basophils Absolute: 0.1 10*3/uL (ref 0.0–0.1)
Basophils Relative: 1 %
Eosinophils Absolute: 0.3 10*3/uL (ref 0.0–0.5)
Eosinophils Relative: 4 %
HCT: 40.7 % (ref 39.0–52.0)
Hemoglobin: 13.9 g/dL (ref 13.0–17.0)
Immature Granulocytes: 0 %
Lymphocytes Relative: 35 %
Lymphs Abs: 2.6 10*3/uL (ref 0.7–4.0)
MCH: 31.2 pg (ref 26.0–34.0)
MCHC: 34.2 g/dL (ref 30.0–36.0)
MCV: 91.3 fL (ref 80.0–100.0)
Monocytes Absolute: 1 10*3/uL (ref 0.1–1.0)
Monocytes Relative: 13 %
Neutro Abs: 3.6 10*3/uL (ref 1.7–7.7)
Neutrophils Relative %: 47 %
Platelets: 227 10*3/uL (ref 150–400)
RBC: 4.46 MIL/uL (ref 4.22–5.81)
RDW: 12.2 % (ref 11.5–15.5)
WBC: 7.5 10*3/uL (ref 4.0–10.5)
nRBC: 0 % (ref 0.0–0.2)

## 2020-10-08 LAB — BASIC METABOLIC PANEL
Anion gap: 6 (ref 5–15)
BUN: 23 mg/dL (ref 8–23)
CO2: 24 mmol/L (ref 22–32)
Calcium: 8.7 mg/dL — ABNORMAL LOW (ref 8.9–10.3)
Chloride: 105 mmol/L (ref 98–111)
Creatinine, Ser: 0.91 mg/dL (ref 0.61–1.24)
GFR, Estimated: >60 mL/min (ref >60)
Glucose, Bld: 113 mg/dL — ABNORMAL HIGH (ref 70–99)
Potassium: 3.6 mmol/L (ref 3.5–5.1)
Sodium: 135 mmol/L (ref 135–145)

## 2020-10-08 MED ORDER — AMINOCAPROIC ACID SOLUTION 5% (50 MG/ML)
10.0000 mL | ORAL | Status: DC
  Administered 2020-10-08 (×2): 10 mL via ORAL
  Filled 2020-10-08: qty 100

## 2020-10-08 NOTE — Progress Notes (Signed)
Call placed to Dr Lucia Gaskins, ENT. Was advised that no urgent appointments are available and Dr Lucia Gaskins is scheduling out to the end of July.   Was also advised that if patient was seen in ER, [facility]NT was on call and will see patient.   Call placed to  [facility]. Appointment scheduled for 10/13/2020 @1 :20pm with Dr. Redmond Baseman.   Patient made aware prior to leaving office.

## 2020-10-08 NOTE — Progress Notes (Signed)
   Subjective:    Patient ID: Tyler Allison, male    DOB: Feb 21, 1941, 80 y.o.   MRN: [id number]  HPI  Patient states that last night, he developed bleeding.  He states that it feels like it is coming from his throat.  He points to his Adam's apple.  He states he feels like there is phlegm around his throat however when he clears his throat, he will bring blood up.  He is not coughing.  He denies any hemoptysis.  He is not throwing up or nauseated.  He denies any hematemesis.  Instead this is simply "hocking up blood".  The blood is mucus around his vocal cords per his description.  He is on Xarelto for atrial fibrillation.  I reviewed his CBC which was normal in the ER today.  He went to the emergency room and was diagnosed with an abrasion on his tongue.  However I inspected his tongue closely today and I see no abrasion.  There is blood on his tongue from where he is just spit some blood out.  But I do not see any trauma and he denies any sores or pain on his tongue.  Also do not see any obvious source of bleeding in the mouth or in the oropharynx  Review of Systems     Objective:   Physical Exam Vitals reviewed.  Constitutional:      Appearance: Normal appearance.  HENT:     Nose: Nose normal.     Mouth/Throat:     Mouth: Mucous membranes are moist.     Pharynx: Oropharynx is clear. No oropharyngeal exudate or posterior oropharyngeal erythema.  Cardiovascular:     Rate and Rhythm: Normal rate. Rhythm irregular.     Heart sounds: Normal heart sounds.  Pulmonary:     Effort: Pulmonary effort is normal. No respiratory distress.     Breath sounds: Normal breath sounds. No stridor. No wheezing, rhonchi or rales.  Abdominal:     General: Bowel sounds are normal.     Palpations: Abdomen is soft.  Musculoskeletal:     Cervical back: Neck supple.  Lymphadenopathy:     Cervical: No cervical adenopathy.  Neurological:     Mental Status: He is alert.           Assessment & Plan:   Spitting up blood  Based on his description, it it truly sounds like the source of bleeding is in his pharynx or larynx.  I suspect a capillary hemorrhage possibly due to coughing.  I have asked the patient to hold Xarelto.  At the present time there is no evidence of any hemodynamic compromise or instability.  His vital signs are stable and his blood counts are normal.  He is basically bringing up blood-tinged mucus.  However I recommended direct laryngoscopy to evaluate for the source of bleeding.  Therefore we will get the patient into see ENT as soon as possible.  If he develops copious bleeding he is to call 911 immediately and go to the emergency room

## 2020-10-08 NOTE — ED Triage Notes (Signed)
Patient states he noticed bleeding in his moth while brushing his teeth. Patient began to cough and noticed blood clots in his spit. Patient takes xarelto.

## 2020-10-08 NOTE — ED Provider Notes (Signed)
[facility] DEPT Provider Note   CSN: [id number] Arrival date & time: 10/08/20  0401     History Chief Complaint  Patient presents with  . bleeding in mouth    Tyler Allison is a 80 y.o. male.  Patient presents to the emergency department for evaluation of bleeding from his mouth.  Patient reports that when he went to bed tonight and brushed his teeth he noticed some blood in his mouth.  He thought it was from brushing his teeth, he does take Xarelto because of atrial fibrillation.  He went to bed and then woke up just prior to coming to the ER with the sensation that there was something on the back of his tongue or in his throat.  He cleared his throat and it was a large blood clot.  He has been having small amount of bleeding since.  Denies trauma.  No chest pain, cough, shortness of breath.  No other bleeding or bruising.        Past Medical History:  Diagnosis Date  . Allergy   . Asthma   . Cancer [facility])    prostate cancer  . Cervical spondylosis with radiculopathy    left c6 radiculopathy  . Chronic back pain   . GERD (gastroesophageal reflux disease)   . Hyperlipidemia   . Hypertension   . Prostate cancer [facility])     Patient Active Problem List   Diagnosis Date Noted  . Aortic atherosclerosis ([place]) 09/11/2020  . Hypertension 09/11/2020  . Close exposure to COVID-19 virus 06/08/2020  . Central sleep apnea 02/07/2020  . Nocturnal hypoxemia 03/01/2019  . GERD (gastroesophageal reflux disease) 10/24/2018  . Paroxysmal atrial fibrillation ([place]) 07/01/2016  . Hyperlipidemia 07/01/2016  . Cervical spondylosis with radiculopathy   . Asthmatic bronchitis, moderate persistent, uncomplicated 83/15/1761  . Chronic back pain   . ADVERSE DRUG REACTION 08/24/2009  . Asthma 08/19/2009  . PULMONARY NODULE 08/19/2009  . COUGH 08/19/2009  . PURE HYPERCHOLESTEROLEMIA 04/24/2009    Past Surgical History:  Procedure Laterality Date  . PROSTATE  BIOPSY    . SPINE SURGERY     back surgery x 3 including lumbar fusion, neck surgery x 2 including C4-6 ACDF  . tympanoplasty with mastoidectomy Left 01/13/2004       Family History  Problem Relation Age of Onset  . Heart failure Mother   . Esophageal cancer Father   . Heart attack Maternal Uncle   . Breast cancer Neg Hx   . Prostate cancer Neg Hx   . Pancreatic cancer Neg Hx   . Colon cancer Neg Hx     Social History   Tobacco Use  . Smoking status: Former Smoker    Packs/day: 0.50    Years: 13.00    Pack years: 6.50    Types: Cigarettes    Start date: 41    Quit date: 10/23/1968    Years since quitting: 51.9  . Smokeless tobacco: Never Used  Vaping Use  . Vaping Use: Never used  Substance Use Topics  . Alcohol use: No  . Drug use: No    Home Medications Prior to Admission medications   Medication Sig Start Date End Date Taking? Authorizing Provider  albuterol (PROAIR HFA) 108 (90 BASE) MCG/ACT inhaler Inhale 2 puffs into the lungs every 4 (four) hours as needed. 10/31/12   [name], MD  atorvastatin (LIPITOR) 40 MG tablet TAKE 1 TABLET(40 MG) BY MOUTH DAILY 09/14/20   [name],  MD  clonazePAM (KLONOPIN) 0.5 MG tablet TAKE 1 TABLET(0.5 MG) BY MOUTH TWICE DAILY AS NEEDED FOR ANXIETY 10/25/19   [name], MD  fluticasone [facility]) 50 MCG/ACT nasal spray USE 2 SPRAYS IN [facility] NOSTRIL EVERY DAY AT NIGHT 06/08/20 07/08/20  Rozetta Nunnery, MD  Melatonin 10 MG TABS Take 10 mg by mouth.    [provider]  metoprolol succinate (TOPROL-XL) 25 MG 24 hr tablet Take 1 tablet by mouth daily 03/13/20   [name], MD  Multiple Vitamins-Minerals (VISION FORMULA PO) Take by mouth. AERDS 2    [provider]  pantoprazole (PROTONIX) 40 MG tablet TAKE 1 TABLET(40 MG) BY MOUTH TWICE DAILY 01/23/20   [name], MD  rivaroxaban (XARELTO) 20 MG TABS tablet Take 1 tablet by mouth daily with supper 07/14/20   Jettie Booze, MD   tamsulosin (FLOMAX) 0.4 MG CAPS capsule Take 1 capsule (0.4 mg total) by mouth daily. 04/19/17   [name], MD  traMADol (ULTRAM) 50 MG tablet Take by mouth every 6 (six) hours as needed.    [provider]    Allergies    Codeine  Review of Systems   Review of Systems  HENT: Negative for nosebleeds and trouble swallowing.   Respiratory: Negative for cough and shortness of breath.   All other systems reviewed and are negative.   Physical Exam Updated Vital Signs BP 136/74   Pulse (!) 49   Temp 98 F (36.7 C) (Oral)   Resp (!) 22   Ht 5' 10.5" (1.791 m)   Wt 90.3 kg   SpO2 92%   BMI 28.15 kg/m   Physical Exam Vitals and nursing note reviewed.  Constitutional:      General: He is not in acute distress.    Appearance: Normal appearance. He is well-developed.  HENT:     Head: Normocephalic and atraumatic.     Right Ear: Hearing normal.     Left Ear: Hearing normal.     Nose: Nose normal.     Mouth/Throat:   Eyes:     Conjunctiva/sclera: Conjunctivae normal.     Pupils: Pupils are equal, round, and reactive to light.  Cardiovascular:     Rate and Rhythm: Regular rhythm.     Heart sounds: S1 normal and S2 normal. No murmur heard. No friction rub. No gallop.   Pulmonary:     Effort: Pulmonary effort is normal. No respiratory distress.     Breath sounds: Normal breath sounds.  Chest:     Chest wall: No tenderness.  Abdominal:     General: Bowel sounds are normal.     Palpations: Abdomen is soft.     Tenderness: There is no abdominal tenderness. There is no guarding or rebound. Negative signs include Murphy's sign and McBurney's sign.     Hernia: No hernia is present.  Musculoskeletal:        General: Normal range of motion.     Cervical back: Normal range of motion and neck supple.  Skin:    General: Skin is warm and dry.     Findings: No rash.  Neurological:     Mental Status: He is alert and oriented to person, place, and time.     GCS: GCS  eye subscore is 4. GCS verbal subscore is 5. GCS motor subscore is 6.     Cranial Nerves: No cranial nerve deficit.     Sensory: No sensory deficit.  Coordination: Coordination normal.  Psychiatric:        Speech: Speech normal.        Behavior: Behavior normal.        Thought Content: Thought content normal.     ED Results / Procedures / Treatments   Labs (all labs ordered are listed, but only abnormal results are displayed) Labs Reviewed  BASIC METABOLIC PANEL - Abnormal; Notable for the following components:      Result Value   Glucose, Bld 113 (*)    Calcium 8.7 (*)    All other components within normal limits  CBC WITH DIFFERENTIAL/PLATELET    EKG None  Radiology No results found.  Procedures Procedures   Medications Ordered in ED Medications  aminocaproic acid (AMICAR) oral solution 50 mg/mL (5%), 100 ml (10 mLs Oral Given 10/08/20 0436)    ED Course  I have reviewed the triage vital signs and the nursing notes.  Pertinent labs & imaging results that were available during my care of the patient were reviewed by me and considered in my medical decision making (see chart for details).    MDM Rules/Calculators/A&P                          Patient presents to the emergency department with complaints of bleeding from his mouth.  Patient noticed a small amount of bleeding when he brushes teeth before bed tonight.  He woke up with a clot in the back of his throat and has had some persistent small amount of bleeding since then.  Oral examination reveals a long abrasion in the center portion of his tongue which is likely the source of bleeding.  Patient is on Xarelto and this is likely the cause of bleeding from this small wound.  He was treated with Amicar in the department.  Vital signs have been unremarkable.  Blood counts are normal including hemoglobin and platelets.  Final Clinical Impression(s) / ED Diagnoses Final diagnoses:  Abrasion of tongue, initial  encounter    Rx / DC Orders ED Discharge Orders    None       Sahra Converse, Gwenyth Allegra, MD 10/08/20 [phone]

## 2020-10-13 DIAGNOSIS — J383 Other diseases of vocal cords: Secondary | ICD-10-CM | POA: Diagnosis not present

## 2020-10-13 DIAGNOSIS — H938X2 Other specified disorders of left ear: Secondary | ICD-10-CM | POA: Diagnosis not present

## 2020-10-13 DIAGNOSIS — R042 Hemoptysis: Secondary | ICD-10-CM | POA: Diagnosis not present

## 2020-10-13 DIAGNOSIS — R490 Dysphonia: Secondary | ICD-10-CM | POA: Insufficient documentation

## 2020-10-13 DIAGNOSIS — Z7901 Long term (current) use of anticoagulants: Secondary | ICD-10-CM | POA: Diagnosis not present

## 2020-10-13 DIAGNOSIS — J343 Hypertrophy of nasal turbinates: Secondary | ICD-10-CM | POA: Diagnosis not present

## 2020-10-15 DIAGNOSIS — C61 Malignant neoplasm of prostate: Secondary | ICD-10-CM | POA: Diagnosis not present

## 2020-10-19 ENCOUNTER — Other Ambulatory Visit: Payer: Self-pay | Admitting: Otolaryngology

## 2020-10-20 ENCOUNTER — Ambulatory Visit: Payer: PPO | Admitting: Radiation Oncology

## 2020-10-22 ENCOUNTER — Telehealth: Payer: Self-pay | Admitting: Pharmacist

## 2020-10-22 NOTE — Progress Notes (Addendum)
    Chronic Care Management Pharmacy Assistant   Name: Tyler Allison  MRN: [id number] DOB: [birth year]  Reason for Encounter: PAP  Medications: Outpatient Encounter Medications as of 10/22/2020  Medication Sig   albuterol (PROAIR HFA) 108 (90 BASE) MCG/ACT inhaler Inhale 2 puffs into the lungs every 4 (four) hours as needed.   atorvastatin (LIPITOR) 40 MG tablet TAKE 1 TABLET(40 MG) BY MOUTH DAILY   clonazePAM (KLONOPIN) 0.5 MG tablet TAKE 1 TABLET(0.5 MG) BY MOUTH TWICE DAILY AS NEEDED FOR ANXIETY   fluticasone (FLONASE) 50 MCG/ACT nasal spray USE 2 SPRAYS IN EACH NOSTRIL EVERY DAY AT NIGHT   Melatonin 10 MG TABS Take 10 mg by mouth.   metoprolol succinate (TOPROL-XL) 25 MG 24 hr tablet Take 1 tablet by mouth daily   Multiple Vitamins-Minerals (VISION FORMULA PO) Take by mouth. AERDS 2   pantoprazole (PROTONIX) 40 MG tablet TAKE 1 TABLET(40 MG) BY MOUTH TWICE DAILY   rivaroxaban (XARELTO) 20 MG TABS tablet Take 1 tablet by mouth daily with supper   tamsulosin (FLOMAX) 0.4 MG CAPS capsule Take 1 capsule (0.4 mg total) by mouth daily.   traMADol (ULTRAM) 50 MG tablet Take by mouth every 6 (six) hours as needed.   No facility-administered encounter medications on file as of 10/22/2020.   Spoke with the patient in regards of his Xarelto patient assistance form. He stated he received a letter from the organization and he was denied for multiple reasons like insurance and income. He stated Gerald Stabs the pharmacist informed him that there may be some other organizations that he could apply to and see if he is able to be approved. He stated he does have enough on hand at this time so he wont run out.   Follow-Up:Pharmacist Review  [name], [place] Pharmacist Assistant [phone]

## 2020-10-27 NOTE — Progress Notes (Signed)
Patient got a 6 month Eligard injection on 09/11/20 and is scheduled for fiducial markers and SpaceOAR gel 10/15/20 with CT SIM to follow 10/20/20.

## 2020-10-29 ENCOUNTER — Encounter (HOSPITAL_COMMUNITY): Payer: Self-pay | Admitting: Otolaryngology

## 2020-10-29 ENCOUNTER — Other Ambulatory Visit: Payer: Self-pay

## 2020-10-29 NOTE — Progress Notes (Signed)
Tyler Allison denies chest pain or shortness of breath, Tyler Allison denies any s/s of Covid in his home and is unaware of any exposures.   Mr Villwock has had 2 sleep studies and found to have sleep apnea.  Tyler Allison was to wear 2 liters of oxygen at hs,but Tyler Allison reports that it makes  too much noise, he cannot wear it.

## 2020-10-30 ENCOUNTER — Ambulatory Visit (HOSPITAL_COMMUNITY): Payer: PPO | Admitting: Certified Registered"

## 2020-10-30 ENCOUNTER — Ambulatory Visit (HOSPITAL_COMMUNITY)
Admission: RE | Admit: 2020-10-30 | Discharge: 2020-10-30 | Disposition: A | Discharge status: Home / Self Care | Payer: PPO | Attending: Otolaryngology | Admitting: Otolaryngology

## 2020-10-30 ENCOUNTER — Encounter (HOSPITAL_COMMUNITY): Payer: Self-pay | Admitting: Otolaryngology

## 2020-10-30 ENCOUNTER — Encounter (HOSPITAL_COMMUNITY)
Admission: RE | Disposition: A | Discharge status: Home / Self Care | Payer: Self-pay | Source: Home / Self Care | Attending: Otolaryngology

## 2020-10-30 DIAGNOSIS — Z885 Allergy status to narcotic agent status: Secondary | ICD-10-CM | POA: Insufficient documentation

## 2020-10-30 DIAGNOSIS — Z7901 Long term (current) use of anticoagulants: Secondary | ICD-10-CM | POA: Insufficient documentation

## 2020-10-30 DIAGNOSIS — I868 Varicose veins of other specified sites: Secondary | ICD-10-CM | POA: Diagnosis not present

## 2020-10-30 DIAGNOSIS — Z8546 Personal history of malignant neoplasm of prostate: Secondary | ICD-10-CM | POA: Insufficient documentation

## 2020-10-30 DIAGNOSIS — R042 Hemoptysis: Secondary | ICD-10-CM | POA: Diagnosis present

## 2020-10-30 DIAGNOSIS — Z79899 Other long term (current) drug therapy: Secondary | ICD-10-CM | POA: Diagnosis not present

## 2020-10-30 DIAGNOSIS — I480 Paroxysmal atrial fibrillation: Secondary | ICD-10-CM | POA: Diagnosis not present

## 2020-10-30 DIAGNOSIS — Z87891 Personal history of nicotine dependence: Secondary | ICD-10-CM | POA: Insufficient documentation

## 2020-10-30 DIAGNOSIS — E7800 Pure hypercholesterolemia, unspecified: Secondary | ICD-10-CM | POA: Diagnosis not present

## 2020-10-30 DIAGNOSIS — J383 Other diseases of vocal cords: Secondary | ICD-10-CM | POA: Diagnosis not present

## 2020-10-30 DIAGNOSIS — R490 Dysphonia: Secondary | ICD-10-CM | POA: Diagnosis not present

## 2020-10-30 DIAGNOSIS — I10 Essential (primary) hypertension: Secondary | ICD-10-CM | POA: Diagnosis not present

## 2020-10-30 HISTORY — PX: MICROLARYNGOSCOPY W/VOCAL CORD INJECTION: SHX2665

## 2020-10-30 HISTORY — DX: Paroxysmal atrial fibrillation: I48.0

## 2020-10-30 HISTORY — DX: Anxiety disorder, unspecified: F41.9

## 2020-10-30 SURGERY — MICROLARYNGOSCOPY, WITH VOCAL CORD INJECTION
Anesthesia: General | Site: Throat

## 2020-10-30 MED ORDER — AMISULPRIDE (ANTIEMETIC) 5 MG/2ML IV SOLN: 10.0000 mg | Freq: Once | INTRAVENOUS | Status: DC | PRN

## 2020-10-30 MED ORDER — TRIAMCINOLONE ACETONIDE 40 MG/ML IJ SUSP
INTRAMUSCULAR | Status: AC
  Filled 2020-10-30: qty 5

## 2020-10-30 MED ORDER — EPHEDRINE SULFATE-NACL 50-0.9 MG/10ML-% IV SOSY
PREFILLED_SYRINGE | INTRAVENOUS | Status: DC | PRN
  Administered 2020-10-30: 15 mg via INTRAVENOUS

## 2020-10-30 MED ORDER — OXYCODONE HCL 5 MG PO TABS: 5.0000 mg | ORAL_TABLET | Freq: Once | ORAL | Status: DC | PRN

## 2020-10-30 MED ORDER — LIDOCAINE 2% (20 MG/ML) 5 ML SYRINGE
INTRAMUSCULAR | Status: AC
  Filled 2020-10-30: qty 10

## 2020-10-30 MED ORDER — PROPOFOL 10 MG/ML IV BOLUS
INTRAVENOUS | Status: AC
  Filled 2020-10-30: qty 20

## 2020-10-30 MED ORDER — ONDANSETRON HCL 4 MG/2ML IJ SOLN
INTRAMUSCULAR | Status: DC | PRN
  Administered 2020-10-30: 4 mg via INTRAVENOUS

## 2020-10-30 MED ORDER — ROCURONIUM BROMIDE 100 MG/10ML IV SOLN
INTRAVENOUS | Status: DC | PRN
  Administered 2020-10-30: 90 mg via INTRAVENOUS

## 2020-10-30 MED ORDER — PROMETHAZINE HCL 25 MG/ML IJ SOLN: 6.2500 mg | INTRAMUSCULAR | Status: DC | PRN

## 2020-10-30 MED ORDER — ROCURONIUM BROMIDE 10 MG/ML (PF) SYRINGE
PREFILLED_SYRINGE | INTRAVENOUS | Status: AC
  Filled 2020-10-30: qty 10

## 2020-10-30 MED ORDER — SUGAMMADEX SODIUM 200 MG/2ML IV SOLN
INTRAVENOUS | Status: DC | PRN
  Administered 2020-10-30: 359.2 mg via INTRAVENOUS

## 2020-10-30 MED ORDER — CHLORHEXIDINE GLUCONATE 0.12 % MT SOLN
15.0000 mL | Freq: Once | OROMUCOSAL | Status: AC
  Administered 2020-10-30: 15 mL via OROMUCOSAL
  Filled 2020-10-30: qty 15

## 2020-10-30 MED ORDER — PROPOFOL 500 MG/50ML IV EMUL
INTRAVENOUS | Status: DC | PRN
  Administered 2020-10-30: 100 ug/kg/min via INTRAVENOUS

## 2020-10-30 MED ORDER — LACTATED RINGERS IV SOLN: INTRAVENOUS | Status: DC

## 2020-10-30 MED ORDER — LIDOCAINE 2% (20 MG/ML) 5 ML SYRINGE
INTRAMUSCULAR | Status: DC | PRN
  Administered 2020-10-30: 80 mg via INTRAVENOUS

## 2020-10-30 MED ORDER — DEXAMETHASONE SODIUM PHOSPHATE 10 MG/ML IJ SOLN
INTRAMUSCULAR | Status: AC
  Filled 2020-10-30: qty 1

## 2020-10-30 MED ORDER — EPINEPHRINE HCL (NASAL) 0.1 % NA SOLN
NASAL | Status: AC
  Filled 2020-10-30: qty 30

## 2020-10-30 MED ORDER — EPHEDRINE 5 MG/ML INJ
INTRAVENOUS | Status: AC
  Filled 2020-10-30: qty 10

## 2020-10-30 MED ORDER — PHENYLEPHRINE HCL-NACL 10-0.9 MG/250ML-% IV SOLN
INTRAVENOUS | Status: DC | PRN
  Administered 2020-10-30: 30 ug/min via INTRAVENOUS

## 2020-10-30 MED ORDER — FENTANYL CITRATE (PF) 250 MCG/5ML IJ SOLN
INTRAMUSCULAR | Status: AC
  Filled 2020-10-30: qty 5

## 2020-10-30 MED ORDER — OXYCODONE HCL 5 MG/5ML PO SOLN: 5.0000 mg | Freq: Once | ORAL | Status: DC | PRN

## 2020-10-30 MED ORDER — ORAL CARE MOUTH RINSE: 15.0000 mL | Freq: Once | OROMUCOSAL | Status: AC

## 2020-10-30 MED ORDER — FENTANYL CITRATE (PF) 100 MCG/2ML IJ SOLN
INTRAMUSCULAR | Status: DC | PRN
  Administered 2020-10-30: 100 ug via INTRAVENOUS
  Administered 2020-10-30: 50 ug via INTRAVENOUS

## 2020-10-30 MED ORDER — DEXAMETHASONE SODIUM PHOSPHATE 10 MG/ML IJ SOLN
INTRAMUSCULAR | Status: DC | PRN
  Administered 2020-10-30: 10 mg via INTRAVENOUS

## 2020-10-30 MED ORDER — ONDANSETRON HCL 4 MG/2ML IJ SOLN
INTRAMUSCULAR | Status: AC
  Filled 2020-10-30: qty 2

## 2020-10-30 MED ORDER — PROPOFOL 10 MG/ML IV BOLUS
INTRAVENOUS | Status: DC | PRN
  Administered 2020-10-30: 130 mg via INTRAVENOUS
  Administered 2020-10-30: 20 mg via INTRAVENOUS

## 2020-10-30 MED ORDER — EPINEPHRINE PF 1 MG/ML IJ SOLN
INTRAMUSCULAR | Status: DC | PRN
  Administered 2020-10-30: 30 mL

## 2020-10-30 MED ORDER — HYDROMORPHONE HCL 1 MG/ML IJ SOLN: 0.2500 mg | INTRAMUSCULAR | Status: DC | PRN

## 2020-10-30 SURGICAL SUPPLY — 29 items
CANISTER SUCT 3000ML PPV (MISCELLANEOUS) ×3 IMPLANT
CNTNR URN SCR LID CUP LEK RST (MISCELLANEOUS) IMPLANT
CONT SPEC 4OZ STRL OR WHT (MISCELLANEOUS)
COVER BACK TABLE 60X90IN (DRAPES) ×3 IMPLANT
COVER MAYO STAND STRL (DRAPES) ×3 IMPLANT
COVER WAND RF STERILE (DRAPES) ×3 IMPLANT
DRAPE HALF SHEET 40X57 (DRAPES) ×3 IMPLANT
GAUZE SPONGE 4X4 12PLY STRL (GAUZE/BANDAGES/DRESSINGS) ×3 IMPLANT
GLOVE SURG ENC MOIS LTX SZ7.5 (GLOVE) ×3 IMPLANT
GOWN STRL REUS W/ TWL LRG LVL3 (GOWN DISPOSABLE) IMPLANT
GOWN STRL REUS W/TWL LRG LVL3 (GOWN DISPOSABLE)
GUARD TEETH (MISCELLANEOUS) IMPLANT
KIT BASIN OR (CUSTOM PROCEDURE TRAY) ×3 IMPLANT
KIT PROLARN PLUS GEL W/NDL (Prosthesis and Implant ENT) ×3 IMPLANT
KIT TURNOVER KIT B (KITS) ×3 IMPLANT
NEEDLE HYPO 25GX1X1/2 BEV (NEEDLE) IMPLANT
NEEDLE TRANS ORAL INJECTION (NEEDLE) IMPLANT
NS IRRIG 1000ML POUR BTL (IV SOLUTION) ×3 IMPLANT
PAD ARMBOARD 7.5X6 YLW CONV (MISCELLANEOUS) ×6 IMPLANT
PATTIES SURGICAL .5 X1 (DISPOSABLE) IMPLANT
PATTIES SURGICAL .5 X3 (DISPOSABLE) IMPLANT
POSITIONER HEAD DONUT 9IN (MISCELLANEOUS) IMPLANT
SOL ANTI FOG 6CC (MISCELLANEOUS) ×1 IMPLANT
SOLUTION ANTI FOG 6CC (MISCELLANEOUS) ×2
SURGILUBE 2OZ TUBE FLIPTOP (MISCELLANEOUS) IMPLANT
TOWEL GREEN STERILE FF (TOWEL DISPOSABLE) ×6 IMPLANT
TUBE CONNECTING 12'X1/4 (SUCTIONS) ×1
TUBE CONNECTING 12X1/4 (SUCTIONS) ×2 IMPLANT
WATER STERILE IRR 1000ML POUR (IV SOLUTION) IMPLANT

## 2020-10-30 NOTE — Brief Op Note (Signed)
10/30/2020  12:53 PM  PATIENT:  Tyler Allison  80 y.o. male  PRE-OPERATIVE DIAGNOSIS:  Dysphonia and hemoptysis  POST-OPERATIVE DIAGNOSIS:  Dysphonia and hemoptysis  PROCEDURE:  Procedure(s): MICRODIRECT LARYNGOSCOPY WITH VOCAL CORD INJECTION AND CO2 LASER APPLICATION (N/A)  SURGEON:  Surgeon(s) and Role:    Melida Quitter, MD - Primary  PHYSICIAN ASSISTANT:   ASSISTANTS: none   ANESTHESIA:   general  EBL: None  BLOOD ADMINISTERED:none  DRAINS: none   LOCAL MEDICATIONS USED:  NONE  SPECIMEN:  No Specimen  DISPOSITION OF SPECIMEN:  N/A  COUNTS:  YES  TOURNIQUET:  * No tourniquets in log *  DICTATION: .Note written in EPIC  PLAN OF CARE: Discharge to home after PACU  PATIENT DISPOSITION:  PACU - hemodynamically stable.   Delay start of Pharmacological VTE agent (>24hrs) due to surgical blood loss or risk of bleeding: no

## 2020-10-30 NOTE — Anesthesia Preprocedure Evaluation (Signed)
Anesthesia Evaluation  Patient identified by MRN, date of birth, ID band Patient awake    Reviewed: Allergy & Precautions, NPO status , Patient's Chart, lab work & pertinent test results  Airway Mallampati: II  TM Distance: >3 FB Neck ROM: Full    Dental no notable dental hx.    Pulmonary asthma , former smoker,    Pulmonary exam normal breath sounds clear to auscultation       Cardiovascular hypertension, Pt. on medications negative cardio ROS Normal cardiovascular exam Rhythm:Regular Rate:Normal     Neuro/Psych Anxiety negative neurological ROS  negative psych ROS   GI/Hepatic Neg liver ROS, GERD  ,  Endo/Other  negative endocrine ROS  Renal/GU negative Renal ROS  negative genitourinary   Musculoskeletal negative musculoskeletal ROS (+)   Abdominal   Peds negative pediatric ROS (+)  Hematology negative hematology ROS (+)   Anesthesia Other Findings   Reproductive/Obstetrics negative OB ROS                             Anesthesia Physical Anesthesia Plan  ASA: 2  Anesthesia Plan: General   Post-op Pain Management:    Induction: Intravenous  PONV Risk Score and Plan: 2 and Ondansetron, Midazolam and Treatment may vary due to age or medical condition  Airway Management Planned: Oral ETT  Additional Equipment:   Intra-op Plan:   Post-operative Plan: Extubation in OR  Informed Consent: I have reviewed the patients History and Physical, chart, labs and discussed the procedure including the risks, benefits and alternatives for the proposed anesthesia with the patient or authorized representative who has indicated his/her understanding and acceptance.     Dental advisory given  Plan Discussed with: CRNA  Anesthesia Plan Comments:         Anesthesia Quick Evaluation

## 2020-10-30 NOTE — H&P (Signed)
Tyler Allison is an 80 y.o. male.   Chief Complaint: Hoarseness, hemoptysis HPI: 80 year old male with recent hemoptysis while taking Xarelto and found to have a dilated vessel in the larynx.  He has also had gradually worsening hoarseness.  Past Medical History:  Diagnosis Date   Allergy    Anxiety    Asthma    Cancer ([place])    prostate cancer   Cervical spondylosis with radiculopathy    left c6 radiculopathy   Chronic back pain    GERD (gastroesophageal reflux disease)    Hyperlipidemia    Hypertension    PAF (paroxysmal atrial fibrillation) ([facility])    Prostate cancer ([facility])     Past Surgical History:  Procedure Laterality Date   BACK SURGERY     PROSTATE BIOPSY     SPINE SURGERY     back surgery x 3 including lumbar fusion, neck surgery x 2 including C4-6 ACDF   tympanoplasty with mastoidectomy Left 01/13/2004    Family History  Problem Relation Age of Onset   Heart failure Mother    Esophageal cancer Father    Heart attack Maternal Uncle    Breast cancer Neg Hx    Prostate cancer Neg Hx    Pancreatic cancer Neg Hx    Colon cancer Neg Hx    Social History:  reports that he quit smoking about 52 years ago. His smoking use included cigarettes. He started smoking about 65 years ago. He has a 6.50 pack-year smoking history. He has never used smokeless tobacco. He reports that he does not drink alcohol and does not use drugs.  Allergies:  Allergies  Allergen Reactions   Codeine Nausea And Vomiting    Medications Prior to Admission  Medication Sig Dispense Refill   acetaminophen (TYLENOL) 500 MG tablet Take 500 mg by mouth every 6 (six) hours as needed for moderate pain.     albuterol (PROAIR HFA) 108 (90 BASE) MCG/ACT inhaler Inhale 2 puffs into the lungs every 4 (four) hours as needed. (Patient taking differently: Inhale 2 puffs into the lungs every 4 (four) hours as needed for shortness of breath or wheezing.) 8.5 g 3   atorvastatin (LIPITOR) 40 MG tablet TAKE 1  TABLET(40 MG) BY MOUTH DAILY (Patient taking differently: Take 40 mg by mouth daily. evening) 90 tablet 1   clonazePAM (KLONOPIN) 0.5 MG tablet TAKE 1 TABLET(0.5 MG) BY MOUTH TWICE DAILY AS NEEDED FOR ANXIETY (Patient taking differently: Take 0.5 mg by mouth 2 (two) times daily as needed for anxiety.) 60 tablet 1   fluticasone (FLONASE) 50 MCG/ACT nasal spray USE 2 SPRAYS IN EACH NOSTRIL EVERY DAY AT NIGHT (Patient taking differently: Place 2 sprays into both nostrils at bedtime.) 16 g 6   Melatonin 10 MG TABS Take 10 mg by mouth at bedtime as needed (sleep).     metoprolol succinate (TOPROL-XL) 25 MG 24 hr tablet Take 1 tablet by mouth daily (Patient taking differently: Take 25 mg by mouth daily. Takes in the evening) 90 tablet 2   OVER THE COUNTER MEDICATION Take 1 capsule by mouth at bedtime as needed (sleep). Relaxium sleep otc supplement     pantoprazole (PROTONIX) 40 MG tablet TAKE 1 TABLET(40 MG) BY MOUTH TWICE DAILY (Patient taking differently: Take 40 mg by mouth 2 (two) times daily.) 180 tablet 3   rivaroxaban (XARELTO) 20 MG TABS tablet Take 1 tablet by mouth daily with supper (Patient taking differently: Take 20 mg by mouth daily with supper.)  90 tablet 1   tamsulosin (FLOMAX) 0.4 MG CAPS capsule Take 1 capsule (0.4 mg total) by mouth daily. (Patient taking differently: Take 0.4 mg by mouth daily after supper.) 90 capsule 3   traMADol (ULTRAM) 50 MG tablet Take 50 mg by mouth at bedtime as needed for moderate pain.     Multiple Vitamins-Minerals (PRESERVISION AREDS 2) CAPS Take 1 capsule by mouth 2 (two) times daily.      No results found for this or any previous visit (from the past 48 hour(s)). No results found.  Review of Systems  All other systems reviewed and are negative.  Blood pressure 140/68, pulse 65, temperature 98.1 F (36.7 C), temperature source Oral, resp. rate 18, height 5' 10.5" (1.791 m), weight 89.8 kg, SpO2 97 %. Physical Exam Constitutional:      Appearance:  Normal appearance. He is normal weight.  HENT:     Head: Normocephalic and atraumatic.     Right Ear: External ear normal.     Left Ear: External ear normal.     Nose: Nose normal.     Mouth/Throat:     Mouth: Mucous membranes are moist.     Pharynx: Oropharynx is clear.     Comments: Moderate, wispy hoarseness. Eyes:     Extraocular Movements: Extraocular movements intact.     Conjunctiva/sclera: Conjunctivae normal.     Pupils: Pupils are equal, round, and reactive to light.  Cardiovascular:     Rate and Rhythm: Normal rate.  Pulmonary:     Effort: Pulmonary effort is normal.  Musculoskeletal:     Cervical back: Normal range of motion.  Skin:    General: Skin is warm and dry.  Neurological:     General: No focal deficit present.     Mental Status: He is alert and oriented to person, place, and time.  Psychiatric:        Mood and Affect: Mood normal.        Behavior: Behavior normal.        Thought Content: Thought content normal.        Judgment: Judgment normal.     Assessment/Plan Hemoptysis, dysphonia  To OR for SMDL with CO2 laser cautery and bilateral Prolaryn injection.  Melida Quitter, MD 10/30/2020, 11:37 AM

## 2020-10-30 NOTE — Op Note (Signed)
PREOPERATIVE DIAGNOSIS:  Hoarseness and hemoptysis   POSTOPERATIVE DIAGNOSIS:  Hoarseness and hemoptysis   PROCEDURE:  Suspended microdirect laryngoscopy with CO2 laser cautery of vocal fold varix and bilateral Prolaryn injection augmentation   SURGEON:  Melida Quitter, MD   ANESTHESIA:  General with jet ventilation by anesthesia.   COMPLICATIONS:  None.   INDICATIONS:  The patient is an 80 year old male with a history of hoarseness and recent hemoptysis.  He was found to have a varix on the left vocal fold as a likely source of hemoptysis and bowing of the vocal folds contributing to hoarseness.  He presents to the operating room for surgical management.   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  Vocal folds bowed.   DESCRIPTION OF PROCEDURE:  The patient was identified in the holding room, informed consent having been obtained including discussion of risks, benefits and alternatives, the patient was brought to the operative suite and put the operative table in  supine position.  Anesthesia was induced and the patient was maintained via mask ventilation.  The eyes were taped closed and bed was turned 90 degrees from anesthesia.  The patient was given intravenous steroids during the  case.  A tooth guard was placed over the upper teeth and a Stortz laryngoscope was placed into the supraglottic position and suspended to Mayo stand using the Lewy arm.  Jet ventilation was initiated.  Damp eye pads were taped over the eyes and a damp towel was placed over the face.  Photodocumentation was performed with the zero degree telescope.  An epinephrine-soaked pledget was held against the left anterior vocal fold for a minute or so while holding ventilation.  The operating microscope was brought into the field and was used to evaluate the larynx.  The left-sided varix was then cauterized using the CO2 laser on a setting of 4 watts and defocused.  The char was wiped away with  another pledget.  Jet ventilation was resumed.  Prolaryn was then injected into each vocal fold totaling 0.4 cc in each side with 3/4 placed posteriorly.  Photodocumentation was repeated.  The larynx was sprayed with topical lidocaine.  The laryngoscope was then taking out of suspension and removed from the patient's mouth while suctioning the airway.  The tooth guard was removed and the patient was turned back to anesthesia for wakeup and taken to the recovery room in stable condition.

## 2020-10-30 NOTE — Discharge Instructions (Addendum)

## 2020-10-30 NOTE — Transfer of Care (Signed)
Immediate Anesthesia Transfer of Care Note  Patient: Tyler Allison  Procedure(s) Performed: MICRODIRECT LARYNGOSCOPY WITH VOCAL CORD INJECTION AND CO2 LASER APPLICATION (Throat)  Patient Location: PACU  Anesthesia Type:General  Level of Consciousness: drowsy  Airway & Oxygen Therapy: Patient Spontanous Breathing and Patient connected to face mask oxygen  Post-op Assessment: Report given to RN and Post -op Vital signs reviewed and stable  Post vital signs: Reviewed and stable  Last Vitals:  Vitals Value Taken Time  BP 125/75 10/30/20 1304  Temp 36.1 C 10/30/20 1304  Pulse 81 10/30/20 1310  Resp 7 10/30/20 1310  SpO2 98 % 10/30/20 1310  Vitals shown include unvalidated device data.  Last Pain:  Vitals:   10/30/20 1304  TempSrc:   PainSc: Asleep         Complications: No notable events documented.

## 2020-10-30 NOTE — Anesthesia Postprocedure Evaluation (Signed)
Anesthesia Post Note  Patient: MALAKHI MARKWOOD  Procedure(s) Performed: MICRODIRECT LARYNGOSCOPY WITH VOCAL CORD INJECTION AND CO2 LASER APPLICATION (Throat)     Patient location during evaluation: PACU Anesthesia Type: General Level of consciousness: awake and alert Pain management: pain level controlled Vital Signs Assessment: post-procedure vital signs reviewed and stable Respiratory status: spontaneous breathing, nonlabored ventilation and respiratory function stable Cardiovascular status: blood pressure returned to baseline and stable Postop Assessment: no apparent nausea or vomiting Anesthetic complications: no   No notable events documented.  Last Vitals:  Vitals:   10/30/20 1349 10/30/20 1355  BP: 110/81 132/76  Pulse: 66 62  Resp: 14 16  Temp:  (!) 36.3 C  SpO2: 94% 95%    Last Pain:  Vitals:   10/30/20 1355  TempSrc:   PainSc: 0-No pain                 Lynda Rainwater

## 2020-11-01 ENCOUNTER — Encounter (HOSPITAL_COMMUNITY): Payer: Self-pay | Admitting: Otolaryngology

## 2020-11-02 ENCOUNTER — Telehealth: Payer: Self-pay | Admitting: *Deleted

## 2020-11-02 NOTE — Telephone Encounter (Signed)
CALLED PATIENT TO REMIND OF SIM APPT. FOR 11-03-20- ARRIVAL TIME- 8:15 AM @ [facility], SPOKE WITH PATIENT AND HE IS AWARE OF THIS APPT.

## 2020-11-03 ENCOUNTER — Other Ambulatory Visit: Payer: Self-pay

## 2020-11-03 ENCOUNTER — Ambulatory Visit
Admission: RE | Admit: 2020-11-03 | Discharge: 2020-11-03 | Disposition: A | Discharge status: Home / Self Care | Payer: PPO | Source: Ambulatory Visit | Attending: Radiation Oncology | Admitting: Radiation Oncology

## 2020-11-03 DIAGNOSIS — C61 Malignant neoplasm of prostate: Secondary | ICD-10-CM | POA: Insufficient documentation

## 2020-11-03 DIAGNOSIS — H353221 Exudative age-related macular degeneration, left eye, with active choroidal neovascularization: Secondary | ICD-10-CM | POA: Diagnosis not present

## 2020-11-03 NOTE — Progress Notes (Signed)
  Radiation Oncology         [phone]) [phone] ________________________________  Name: Tyler Allison MRN: [id number]  Date: 11/03/2020  DOB: 17-May-1940  SIMULATION AND TREATMENT PLANNING NOTE    ICD-10-CM   1. Malignant neoplasm of prostate ([place])  C61       DIAGNOSIS:  80 y.o. gentleman with stage T1c adenocarcinoma of the prostate with a Gleason's score of 4+4 and a PSA of 8.76  NARRATIVE:  The patient was brought to the [facility].  Identity was confirmed.  All relevant records and images related to the planned course of therapy were reviewed.  The patient freely provided informed written consent to proceed with treatment after reviewing the details related to the planned course of therapy. The consent form was witnessed and verified by the simulation staff.  Then, the patient was set-up in a stable reproducible supine position for radiation therapy.  A vacuum lock pillow device was custom fabricated to position his legs in a reproducible immobilized position.  Then, I performed a urethrogram under sterile conditions to identify the prostatic bed.  CT images were obtained.  Surface markings were placed.  The CT images were loaded into the planning software.  Then the prostate bed target, pelvic lymph node target and avoidance structures including the rectum, bladder, bowel and hips were contoured.  Treatment planning then occurred.  The radiation prescription was entered and confirmed.  A total of one complex treatment devices were fabricated. I have requested : Intensity Modulated Radiotherapy (IMRT) is medically necessary for this case for the following reason:  Rectal sparing.Marland Kitchen  PLAN:  The patient will receive 45 Gy in 25 fractions of 1.8 Gy, followed by a boost to the prostate to a total dose of 75 Gy with 15 additional fractions of 2 Gy.   ________________________________  Sheral Apley Tammi Klippel, M.D.

## 2020-11-04 ENCOUNTER — Telehealth: Payer: Self-pay | Admitting: Pharmacist

## 2020-11-04 NOTE — Progress Notes (Addendum)
    Chronic Care Management Pharmacy Assistant   Name: Tyler Allison  MRN: [id number] DOB: 11-18-1940   Reason for Encounter: Adherence Review   Reviewed the chart for any medical/health and/or medication changes there were not any changes at this time.   Follow-Up:Pharmacist Review  [name], [place] Pharmacist Assistant [phone]

## 2020-11-06 DIAGNOSIS — C61 Malignant neoplasm of prostate: Secondary | ICD-10-CM | POA: Diagnosis not present

## 2020-11-10 ENCOUNTER — Telehealth: Payer: Self-pay | Admitting: Family Medicine

## 2020-11-10 NOTE — Telephone Encounter (Signed)
   Tyler Allison DOB: January 29, 1941 MRN: [id number]   RIDER WAIVER AND RELEASE OF LIABILITY  For purposes of improving physical access to our facilities, [facility] is pleased to partner with third parties to provide [facility] patients or other authorized individuals the option of convenient, on-demand ground transportation services (the [place]") through use of the technology service that enables users to request on-demand ground transportation from independent third-party providers.  By opting to use and accept these [employer], I, the undersigned, hereby agree on behalf of myself, and on behalf of any minor child using the Government social research officer for whom I am the parent or legal guardian, as follows:  Government social research officer provided to me are provided by independent third-party transportation providers who are not [employer] or employees and who are unaffiliated with [employer]. [facility] is neither a transportation carrier nor a common or public carrier. [facility] has no control over the quality or safety of the transportation that occurs as a result of the [employer]. [facility] cannot guarantee that any third-party transportation provider will complete any arranged transportation service. [facility] makes no representation, warranty, or guarantee regarding the reliability, timeliness, quality, safety, suitability, or availability of any of the Transport Services or that they will be error free. I fully understand that traveling by vehicle involves risks and dangers of serious bodily injury, including permanent disability, paralysis, and death. I agree, on behalf of myself and on behalf of any minor child using the Transport Services for whom I am the parent or legal guardian, that the entire risk arising out of my use of the [employer] remains solely with me, to the maximum extent permitted under applicable law. The [employer] are provided "as  is" and "as available." [facility] disclaims all representations and warranties, express, implied or statutory, not expressly set out in these terms, including the implied warranties of merchantability and fitness for a particular purpose. I hereby waive and release [facility], its agents, employees, officers, directors, representatives, insurers, attorneys, assigns, successors, subsidiaries, and affiliates from any and all past, present, or future claims, demands, liabilities, actions, causes of action, or suits of any kind directly or indirectly arising from acceptance and use of the [employer]. I further waive and release [facility] and its affiliates from all present and future liability and responsibility for any injury or death to persons or damages to property caused by or related to the use of the [employer]. I have read this Waiver and Release of Liability, and I understand the terms used in it and their legal significance. This Waiver is freely and voluntarily given with the understanding that my right (as well as the right of any minor child for whom I am the parent or legal guardian using the [employer]) to legal recourse against [facility] in connection with the [employer] is knowingly surrendered in return for use of these services.   I attest that I read the consent document to Hendricks Limes, gave Mr. Cancro the opportunity to ask questions and answered the questions asked (if any). I affirm that Tyler Allison then provided consent for he's participation in this program.     Tyler Allison

## 2020-11-12 ENCOUNTER — Other Ambulatory Visit: Payer: Self-pay

## 2020-11-12 ENCOUNTER — Ambulatory Visit
Admission: RE | Admit: 2020-11-12 | Discharge: 2020-11-12 | Disposition: A | Discharge status: Home / Self Care | Payer: PPO | Source: Ambulatory Visit | Attending: Radiation Oncology | Admitting: Radiation Oncology

## 2020-11-12 DIAGNOSIS — C61 Malignant neoplasm of prostate: Secondary | ICD-10-CM | POA: Diagnosis not present

## 2020-11-12 NOTE — Telephone Encounter (Signed)
**Note De-Identified Priyah Schmuck Obfuscation** I called [name] and [name] and s/w Vaughan Basta concerning the pts application for asst with his Xarelto. Per Vaughan Basta the pt was denied for not meeting the spend down requirement which in the pts case is $1652.00 for this year (2022).  Vaughan Basta states that the pt was mailed a denial letter from [employer] on 5/27.

## 2020-11-13 ENCOUNTER — Ambulatory Visit
Admission: RE | Admit: 2020-11-13 | Discharge: 2020-11-13 | Disposition: A | Discharge status: Home / Self Care | Payer: PPO | Source: Ambulatory Visit | Attending: Radiation Oncology | Admitting: Radiation Oncology

## 2020-11-13 DIAGNOSIS — C61 Malignant neoplasm of prostate: Secondary | ICD-10-CM | POA: Diagnosis not present

## 2020-11-16 ENCOUNTER — Telehealth: Payer: Self-pay | Admitting: Interventional Cardiology

## 2020-11-16 ENCOUNTER — Other Ambulatory Visit: Payer: Self-pay

## 2020-11-16 ENCOUNTER — Ambulatory Visit
Admission: RE | Admit: 2020-11-16 | Discharge: 2020-11-16 | Disposition: A | Discharge status: Home / Self Care | Payer: PPO | Source: Ambulatory Visit | Attending: Radiation Oncology | Admitting: Radiation Oncology

## 2020-11-16 DIAGNOSIS — C61 Malignant neoplasm of prostate: Secondary | ICD-10-CM | POA: Diagnosis not present

## 2020-11-16 NOTE — Telephone Encounter (Signed)
HIM called and left a message informing the patient of the error and to obtain his refund of his Tyler Allison and Tyler Allison Medication assistance form completed back in May. HIM informed him on the message he can come into the [facility]. office at his convenience to obtain his refund.  AO 11/16/20

## 2020-11-17 ENCOUNTER — Telehealth: Payer: Self-pay | Admitting: Interventional Cardiology

## 2020-11-17 ENCOUNTER — Ambulatory Visit
Admission: RE | Admit: 2020-11-17 | Discharge: 2020-11-17 | Disposition: A | Discharge status: Home / Self Care | Payer: PPO | Source: Ambulatory Visit | Attending: Radiation Oncology | Admitting: Radiation Oncology

## 2020-11-17 DIAGNOSIS — C61 Malignant neoplasm of prostate: Secondary | ICD-10-CM | POA: Diagnosis not present

## 2020-11-17 DIAGNOSIS — H353211 Exudative age-related macular degeneration, right eye, with active choroidal neovascularization: Secondary | ICD-10-CM | POA: Diagnosis not present

## 2020-11-17 NOTE — Telephone Encounter (Signed)
Patient came into the office to obtain a Form Refund. HIM assisted at check-in and the patient signed to receive his check back. AO 11/17/20

## 2020-11-18 ENCOUNTER — Ambulatory Visit
Admission: RE | Admit: 2020-11-18 | Discharge: 2020-11-18 | Disposition: A | Discharge status: Home / Self Care | Payer: PPO | Source: Ambulatory Visit | Attending: Radiation Oncology | Admitting: Radiation Oncology

## 2020-11-18 ENCOUNTER — Other Ambulatory Visit: Payer: Self-pay

## 2020-11-18 DIAGNOSIS — C61 Malignant neoplasm of prostate: Secondary | ICD-10-CM | POA: Diagnosis not present

## 2020-11-19 ENCOUNTER — Ambulatory Visit: Payer: PPO

## 2020-11-20 ENCOUNTER — Ambulatory Visit
Admission: RE | Admit: 2020-11-20 | Discharge: 2020-11-20 | Disposition: A | Discharge status: Home / Self Care | Payer: PPO | Source: Ambulatory Visit | Attending: Radiation Oncology | Admitting: Radiation Oncology

## 2020-11-20 ENCOUNTER — Other Ambulatory Visit: Payer: Self-pay

## 2020-11-20 ENCOUNTER — Other Ambulatory Visit: Payer: Self-pay | Admitting: Radiation Oncology

## 2020-11-20 DIAGNOSIS — C61 Malignant neoplasm of prostate: Secondary | ICD-10-CM | POA: Diagnosis not present

## 2020-11-20 MED ORDER — TAMSULOSIN HCL 0.4 MG PO CAPS: 0.4000 mg | ORAL_CAPSULE | Freq: Every day | ORAL | 3 refills | Status: AC

## 2020-11-23 ENCOUNTER — Ambulatory Visit
Admission: RE | Admit: 2020-11-23 | Discharge: 2020-11-23 | Disposition: A | Discharge status: Home / Self Care | Payer: PPO | Source: Ambulatory Visit | Attending: Radiation Oncology | Admitting: Radiation Oncology

## 2020-11-23 ENCOUNTER — Other Ambulatory Visit: Payer: Self-pay

## 2020-11-23 DIAGNOSIS — C61 Malignant neoplasm of prostate: Secondary | ICD-10-CM | POA: Diagnosis not present

## 2020-11-24 ENCOUNTER — Ambulatory Visit
Admission: RE | Admit: 2020-11-24 | Discharge: 2020-11-24 | Disposition: A | Discharge status: Home / Self Care | Payer: PPO | Source: Ambulatory Visit | Attending: Radiation Oncology | Admitting: Radiation Oncology

## 2020-11-24 DIAGNOSIS — C61 Malignant neoplasm of prostate: Secondary | ICD-10-CM | POA: Diagnosis not present

## 2020-11-25 ENCOUNTER — Ambulatory Visit
Admission: RE | Admit: 2020-11-25 | Discharge: 2020-11-25 | Disposition: A | Discharge status: Home / Self Care | Payer: PPO | Source: Ambulatory Visit | Attending: Radiation Oncology | Admitting: Radiation Oncology

## 2020-11-25 ENCOUNTER — Other Ambulatory Visit: Payer: Self-pay

## 2020-11-25 DIAGNOSIS — C61 Malignant neoplasm of prostate: Secondary | ICD-10-CM | POA: Diagnosis not present

## 2020-11-26 ENCOUNTER — Ambulatory Visit
Admission: RE | Admit: 2020-11-26 | Discharge: 2020-11-26 | Disposition: A | Discharge status: Home / Self Care | Payer: PPO | Source: Ambulatory Visit | Attending: Radiation Oncology | Admitting: Radiation Oncology

## 2020-11-26 DIAGNOSIS — C61 Malignant neoplasm of prostate: Secondary | ICD-10-CM | POA: Diagnosis not present

## 2020-11-27 ENCOUNTER — Other Ambulatory Visit: Payer: Self-pay

## 2020-11-27 ENCOUNTER — Ambulatory Visit
Admission: RE | Admit: 2020-11-27 | Discharge: 2020-11-27 | Disposition: A | Discharge status: Home / Self Care | Payer: PPO | Source: Ambulatory Visit | Attending: Radiation Oncology | Admitting: Radiation Oncology

## 2020-11-27 DIAGNOSIS — C61 Malignant neoplasm of prostate: Secondary | ICD-10-CM | POA: Diagnosis not present

## 2020-11-30 ENCOUNTER — Other Ambulatory Visit: Payer: Self-pay

## 2020-11-30 ENCOUNTER — Ambulatory Visit
Admission: RE | Admit: 2020-11-30 | Discharge: 2020-11-30 | Disposition: A | Discharge status: Home / Self Care | Payer: PPO | Source: Ambulatory Visit | Attending: Radiation Oncology | Admitting: Radiation Oncology

## 2020-11-30 DIAGNOSIS — C61 Malignant neoplasm of prostate: Secondary | ICD-10-CM | POA: Diagnosis not present

## 2020-12-01 ENCOUNTER — Ambulatory Visit
Admission: RE | Admit: 2020-12-01 | Discharge: 2020-12-01 | Disposition: A | Discharge status: Home / Self Care | Payer: PPO | Source: Ambulatory Visit | Attending: Radiation Oncology | Admitting: Radiation Oncology

## 2020-12-01 DIAGNOSIS — C61 Malignant neoplasm of prostate: Secondary | ICD-10-CM | POA: Diagnosis not present

## 2020-12-02 ENCOUNTER — Ambulatory Visit
Admission: RE | Admit: 2020-12-02 | Discharge: 2020-12-02 | Disposition: A | Discharge status: Home / Self Care | Payer: PPO | Source: Ambulatory Visit | Attending: Radiation Oncology | Admitting: Radiation Oncology

## 2020-12-02 ENCOUNTER — Other Ambulatory Visit: Payer: Self-pay

## 2020-12-02 DIAGNOSIS — C61 Malignant neoplasm of prostate: Secondary | ICD-10-CM | POA: Diagnosis not present

## 2020-12-03 ENCOUNTER — Ambulatory Visit
Admission: RE | Admit: 2020-12-03 | Discharge: 2020-12-03 | Disposition: A | Discharge status: Home / Self Care | Payer: PPO | Source: Ambulatory Visit | Attending: Radiation Oncology | Admitting: Radiation Oncology

## 2020-12-03 DIAGNOSIS — R490 Dysphonia: Secondary | ICD-10-CM | POA: Diagnosis not present

## 2020-12-03 DIAGNOSIS — C61 Malignant neoplasm of prostate: Secondary | ICD-10-CM | POA: Diagnosis not present

## 2020-12-03 DIAGNOSIS — R042 Hemoptysis: Secondary | ICD-10-CM | POA: Diagnosis not present

## 2020-12-04 ENCOUNTER — Ambulatory Visit
Admission: RE | Admit: 2020-12-04 | Discharge: 2020-12-04 | Disposition: A | Discharge status: Home / Self Care | Payer: PPO | Source: Ambulatory Visit | Attending: Radiation Oncology | Admitting: Radiation Oncology

## 2020-12-04 ENCOUNTER — Other Ambulatory Visit: Payer: Self-pay

## 2020-12-04 DIAGNOSIS — C61 Malignant neoplasm of prostate: Secondary | ICD-10-CM | POA: Diagnosis not present

## 2020-12-07 ENCOUNTER — Other Ambulatory Visit: Payer: Self-pay

## 2020-12-07 ENCOUNTER — Ambulatory Visit
Admission: RE | Admit: 2020-12-07 | Discharge: 2020-12-07 | Disposition: A | Discharge status: Home / Self Care | Payer: PPO | Source: Ambulatory Visit | Attending: Radiation Oncology | Admitting: Radiation Oncology

## 2020-12-07 DIAGNOSIS — H353221 Exudative age-related macular degeneration, left eye, with active choroidal neovascularization: Secondary | ICD-10-CM | POA: Diagnosis not present

## 2020-12-07 DIAGNOSIS — C61 Malignant neoplasm of prostate: Secondary | ICD-10-CM | POA: Diagnosis not present

## 2020-12-07 DIAGNOSIS — Z510 Encounter for antineoplastic radiation therapy: Secondary | ICD-10-CM | POA: Insufficient documentation

## 2020-12-08 ENCOUNTER — Ambulatory Visit
Admission: RE | Admit: 2020-12-08 | Discharge: 2020-12-08 | Disposition: A | Discharge status: Home / Self Care | Payer: PPO | Source: Ambulatory Visit | Attending: Radiation Oncology | Admitting: Radiation Oncology

## 2020-12-08 DIAGNOSIS — Z510 Encounter for antineoplastic radiation therapy: Secondary | ICD-10-CM | POA: Diagnosis not present

## 2020-12-08 DIAGNOSIS — C61 Malignant neoplasm of prostate: Secondary | ICD-10-CM | POA: Diagnosis not present

## 2020-12-09 ENCOUNTER — Ambulatory Visit
Admission: RE | Admit: 2020-12-09 | Discharge: 2020-12-09 | Disposition: A | Discharge status: Home / Self Care | Payer: PPO | Source: Ambulatory Visit | Attending: Radiation Oncology | Admitting: Radiation Oncology

## 2020-12-09 DIAGNOSIS — Z510 Encounter for antineoplastic radiation therapy: Secondary | ICD-10-CM | POA: Diagnosis not present

## 2020-12-09 DIAGNOSIS — C61 Malignant neoplasm of prostate: Secondary | ICD-10-CM | POA: Diagnosis not present

## 2020-12-10 ENCOUNTER — Ambulatory Visit
Admission: RE | Admit: 2020-12-10 | Discharge: 2020-12-10 | Disposition: A | Discharge status: Home / Self Care | Payer: PPO | Source: Ambulatory Visit | Attending: Radiation Oncology | Admitting: Radiation Oncology

## 2020-12-10 ENCOUNTER — Other Ambulatory Visit: Payer: Self-pay

## 2020-12-10 DIAGNOSIS — C61 Malignant neoplasm of prostate: Secondary | ICD-10-CM | POA: Diagnosis not present

## 2020-12-10 DIAGNOSIS — Z510 Encounter for antineoplastic radiation therapy: Secondary | ICD-10-CM | POA: Diagnosis not present

## 2020-12-11 ENCOUNTER — Ambulatory Visit (INDEPENDENT_AMBULATORY_CARE_PROVIDER_SITE_OTHER): Payer: PPO | Admitting: Nurse Practitioner

## 2020-12-11 ENCOUNTER — Ambulatory Visit
Admission: RE | Admit: 2020-12-11 | Discharge: 2020-12-11 | Disposition: A | Discharge status: Home / Self Care | Payer: PPO | Source: Ambulatory Visit | Attending: Radiation Oncology | Admitting: Radiation Oncology

## 2020-12-11 ENCOUNTER — Encounter: Payer: Self-pay | Admitting: Nurse Practitioner

## 2020-12-11 ENCOUNTER — Other Ambulatory Visit: Payer: Self-pay

## 2020-12-11 VITALS — BP 110/72 | HR 85 | Temp 98.6°F | Ht 70.5 in | Wt 203.4 lb

## 2020-12-11 DIAGNOSIS — S80262A Insect bite (nonvenomous), left knee, initial encounter: Secondary | ICD-10-CM | POA: Diagnosis not present

## 2020-12-11 DIAGNOSIS — W57XXXA Bitten or stung by nonvenomous insect and other nonvenomous arthropods, initial encounter: Secondary | ICD-10-CM | POA: Diagnosis not present

## 2020-12-11 DIAGNOSIS — Z510 Encounter for antineoplastic radiation therapy: Secondary | ICD-10-CM | POA: Diagnosis not present

## 2020-12-11 DIAGNOSIS — C61 Malignant neoplasm of prostate: Secondary | ICD-10-CM | POA: Diagnosis not present

## 2020-12-11 MED ORDER — PREDNISONE 10 MG PO TABS: ORAL_TABLET | ORAL | 0 refills | Status: AC

## 2020-12-11 NOTE — Progress Notes (Signed)
Subjective:    Patient ID: Tyler Allison, male    DOB: 01/17/1941, 80 y.o.   MRN: [id number]  HPI: Tyler Allison is a 80 y.o. male presenting for bug bites.   Chief Complaint  Patient presents with   Insect Bite    Happened Tuesday, bitten by yellow jacket bee on several places, still stings and itch. Taking benadryl for reaction   BUG BITES Reports he accidentally drove his riding lawn mower over a yellow jacket nest and they started chasing him and stung him in multiple areas. Duration: days Location: under left arm, right and left ankle, left knee History of trauma in area: no Pain:  yes; stinging pain comes randomly Quality: stinging Severity: moderate Redness: yes Swelling: no Oozing: no Pus: no Fevers: no Nausea/vomiting: no Difficulty breathing: no Throat itching/swelling: no Status: better Treatments attempted: benadryl cream and oral benadryl   Allergies  Allergen Reactions   Codeine Nausea And Vomiting    Outpatient Encounter Medications as of 12/11/2020  Medication Sig Note   acetaminophen (TYLENOL) 500 MG tablet Take 500 mg by mouth every 6 (six) hours as needed for moderate pain.    albuterol (PROAIR HFA) 108 (90 BASE) MCG/ACT inhaler Inhale 2 puffs into the lungs every 4 (four) hours as needed. (Patient taking differently: Inhale 2 puffs into the lungs every 4 (four) hours as needed for shortness of breath or wheezing.)    atorvastatin (LIPITOR) 40 MG tablet TAKE 1 TABLET(40 MG) BY MOUTH DAILY (Patient taking differently: Take 40 mg by mouth daily. evening)    clonazePAM (KLONOPIN) 0.5 MG tablet TAKE 1 TABLET(0.5 MG) BY MOUTH TWICE DAILY AS NEEDED FOR ANXIETY (Patient taking differently: Take 0.5 mg by mouth 2 (two) times daily as needed for anxiety.)    Melatonin 10 MG TABS Take 10 mg by mouth at bedtime as needed (sleep).    metoprolol succinate (TOPROL-XL) 25 MG 24 hr tablet Take 1 tablet by mouth daily (Patient taking differently: Take 25 mg by mouth  daily. Takes in the evening)    Multiple Vitamins-Minerals (PRESERVISION AREDS 2) CAPS Take 1 capsule by mouth 2 (two) times daily.    OVER THE COUNTER MEDICATION Take 1 capsule by mouth at bedtime as needed (sleep). Relaxium sleep otc supplement    pantoprazole (PROTONIX) 40 MG tablet TAKE 1 TABLET(40 MG) BY MOUTH TWICE DAILY (Patient taking differently: Take 40 mg by mouth 2 (two) times daily.)    predniSONE (DELTASONE) 10 MG tablet Take 4 tablets (40 mg total) by mouth daily with breakfast for 1 day, THEN 3 tablets (30 mg total) daily with breakfast for 1 day, THEN 2 tablets (20 mg total) daily with breakfast for 1 day, THEN 1 tablet (10 mg total) daily with breakfast for 1 day.    rivaroxaban (XARELTO) 20 MG TABS tablet Take 1 tablet by mouth daily with supper (Patient taking differently: Take 20 mg by mouth daily with supper.)    tamsulosin (FLOMAX) 0.4 MG CAPS capsule Take 1 capsule (0.4 mg total) by mouth daily after supper.    traMADol (ULTRAM) 50 MG tablet Take 50 mg by mouth at bedtime as needed for moderate pain. 10/23/2020: Pt ran out, need to get new refill   fluticasone (FLONASE) 50 MCG/ACT nasal spray USE 2 SPRAYS IN EACH NOSTRIL EVERY DAY AT NIGHT (Patient taking differently: Place 2 sprays into both nostrils at bedtime.)    No facility-administered encounter medications on file as of 12/11/2020.    Patient  Active Problem List   Diagnosis Date Noted   Malignant neoplasm of prostate ([place]) 11/03/2020   Aortic atherosclerosis ([place]) 09/11/2020   Hypertension 09/11/2020   Close exposure to COVID-19 virus 06/08/2020   Central sleep apnea 02/07/2020   Nocturnal hypoxemia 03/01/2019   GERD (gastroesophageal reflux disease) 10/24/2018   Paroxysmal atrial fibrillation ([place]) 07/01/2016   Hyperlipidemia 07/01/2016   Cervical spondylosis with radiculopathy    Asthmatic bronchitis, moderate persistent, uncomplicated [id number]   Chronic back pain    ADVERSE DRUG REACTION 08/24/2009    Asthma 08/19/2009   PULMONARY NODULE 08/19/2009   COUGH 08/19/2009   PURE HYPERCHOLESTEROLEMIA 04/24/2009    Past Medical History:  Diagnosis Date   Allergy    Anxiety    Asthma    Cancer ([place])    prostate cancer   Cervical spondylosis with radiculopathy    left c6 radiculopathy   Chronic back pain    GERD (gastroesophageal reflux disease)    Hyperlipidemia    Hypertension    PAF (paroxysmal atrial fibrillation) (HCC)    Prostate cancer ([place])     Relevant past medical, surgical, family and social history reviewed and updated as indicated. Interim medical history since our last visit reviewed.  Review of Systems Per HPI unless specifically indicated above     Objective:    BP 110/72   Pulse 85   Temp 98.6 F (37 C)   Ht 5' 10.5" (1.791 m)   Wt 203 lb 6.4 oz (92.3 kg)   SpO2 95%   BMI 28.77 kg/m   Wt Readings from Last 3 Encounters:  12/11/20 203 lb 6.4 oz (92.3 kg)  10/30/20 198 lb (89.8 kg)  10/08/20 201 lb (91.2 kg)    Physical Exam Vitals and nursing note reviewed.  Constitutional:      General: He is not in acute distress.    Appearance: Normal appearance. He is normal weight. He is not toxic-appearing.  Musculoskeletal:     Right lower leg: No edema.     Left lower leg: No edema.  Skin:    General: Skin is warm and dry.     Capillary Refill: Capillary refill takes less than 2 seconds.     Coloration: Skin is not jaundiced or pale.     Findings: Lesion present. No erythema.          Comments: Multiple, small, slightly erythematous lesions noted to left under arm, left knee, left medial ankle, and right lateral ankle.  No surrounding fluctuance, swelling, oozing, or drainge.  Neurological:     Mental Status: He is alert and oriented to person, place, and time.  Psychiatric:        Mood and Affect: Mood normal.        Behavior: Behavior normal.        Thought Content: Thought content normal.        Judgment: Judgment normal.      Assessment &  Plan:  1. Insect bite of left knee, initial encounter Acute. Will treat with short prednisone burst to help with inflammation.  Can also use oral non-drowsy antihistamine during the day like zyrtec or claritin to help with itching and histamine response.  No s/s infection today and lesions should continue to improve.  Patient to return to clinic if symptoms persist or worsen.  - predniSONE (DELTASONE) 10 MG tablet; Take 4 tablets (40 mg total) by mouth daily with breakfast for 1 day, THEN 3 tablets (30 mg total) daily with breakfast for  1 day, THEN 2 tablets (20 mg total) daily with breakfast for 1 day, THEN 1 tablet (10 mg total) daily with breakfast for 1 day.  Dispense: 10 tablet; Refill: 0   Follow up plan: Return if symptoms worsen or fail to improve.

## 2020-12-14 ENCOUNTER — Other Ambulatory Visit: Payer: Self-pay

## 2020-12-14 ENCOUNTER — Ambulatory Visit
Admission: RE | Admit: 2020-12-14 | Discharge: 2020-12-14 | Disposition: A | Discharge status: Home / Self Care | Payer: PPO | Source: Ambulatory Visit | Attending: Radiation Oncology | Admitting: Radiation Oncology

## 2020-12-14 DIAGNOSIS — C61 Malignant neoplasm of prostate: Secondary | ICD-10-CM | POA: Diagnosis not present

## 2020-12-14 DIAGNOSIS — Z510 Encounter for antineoplastic radiation therapy: Secondary | ICD-10-CM | POA: Diagnosis not present

## 2020-12-15 ENCOUNTER — Ambulatory Visit
Admission: RE | Admit: 2020-12-15 | Discharge: 2020-12-15 | Disposition: A | Discharge status: Home / Self Care | Payer: PPO | Source: Ambulatory Visit | Attending: Radiation Oncology | Admitting: Radiation Oncology

## 2020-12-15 DIAGNOSIS — C61 Malignant neoplasm of prostate: Secondary | ICD-10-CM | POA: Diagnosis not present

## 2020-12-15 DIAGNOSIS — Z510 Encounter for antineoplastic radiation therapy: Secondary | ICD-10-CM | POA: Diagnosis not present

## 2020-12-16 ENCOUNTER — Other Ambulatory Visit: Payer: Self-pay

## 2020-12-16 ENCOUNTER — Ambulatory Visit
Admission: RE | Admit: 2020-12-16 | Discharge: 2020-12-16 | Disposition: A | Discharge status: Home / Self Care | Payer: PPO | Source: Ambulatory Visit | Attending: Radiation Oncology | Admitting: Radiation Oncology

## 2020-12-16 DIAGNOSIS — C61 Malignant neoplasm of prostate: Secondary | ICD-10-CM | POA: Diagnosis not present

## 2020-12-16 DIAGNOSIS — Z510 Encounter for antineoplastic radiation therapy: Secondary | ICD-10-CM | POA: Diagnosis not present

## 2020-12-17 ENCOUNTER — Ambulatory Visit: Payer: PPO

## 2020-12-17 ENCOUNTER — Ambulatory Visit
Admission: RE | Admit: 2020-12-17 | Discharge: 2020-12-17 | Disposition: A | Discharge status: Home / Self Care | Payer: PPO | Source: Ambulatory Visit | Attending: Radiation Oncology | Admitting: Radiation Oncology

## 2020-12-17 DIAGNOSIS — Z510 Encounter for antineoplastic radiation therapy: Secondary | ICD-10-CM | POA: Diagnosis not present

## 2020-12-17 DIAGNOSIS — C61 Malignant neoplasm of prostate: Secondary | ICD-10-CM | POA: Diagnosis not present

## 2020-12-18 ENCOUNTER — Ambulatory Visit
Admission: RE | Admit: 2020-12-18 | Discharge: 2020-12-18 | Disposition: A | Discharge status: Home / Self Care | Payer: PPO | Source: Ambulatory Visit | Attending: Radiation Oncology | Admitting: Radiation Oncology

## 2020-12-18 ENCOUNTER — Ambulatory Visit: Payer: PPO

## 2020-12-18 DIAGNOSIS — C61 Malignant neoplasm of prostate: Secondary | ICD-10-CM | POA: Diagnosis not present

## 2020-12-18 DIAGNOSIS — Z510 Encounter for antineoplastic radiation therapy: Secondary | ICD-10-CM | POA: Diagnosis not present

## 2020-12-21 ENCOUNTER — Other Ambulatory Visit: Payer: Self-pay

## 2020-12-21 ENCOUNTER — Ambulatory Visit
Admission: RE | Admit: 2020-12-21 | Discharge: 2020-12-21 | Disposition: A | Discharge status: Home / Self Care | Payer: PPO | Source: Ambulatory Visit | Attending: Radiation Oncology | Admitting: Radiation Oncology

## 2020-12-21 DIAGNOSIS — C61 Malignant neoplasm of prostate: Secondary | ICD-10-CM | POA: Diagnosis not present

## 2020-12-21 DIAGNOSIS — Z510 Encounter for antineoplastic radiation therapy: Secondary | ICD-10-CM | POA: Diagnosis not present

## 2020-12-22 ENCOUNTER — Ambulatory Visit
Admission: RE | Admit: 2020-12-22 | Discharge: 2020-12-22 | Disposition: A | Discharge status: Home / Self Care | Payer: PPO | Source: Ambulatory Visit | Attending: Radiation Oncology | Admitting: Radiation Oncology

## 2020-12-22 DIAGNOSIS — C61 Malignant neoplasm of prostate: Secondary | ICD-10-CM | POA: Diagnosis not present

## 2020-12-22 DIAGNOSIS — Z510 Encounter for antineoplastic radiation therapy: Secondary | ICD-10-CM | POA: Diagnosis not present

## 2020-12-23 ENCOUNTER — Other Ambulatory Visit: Payer: Self-pay

## 2020-12-23 ENCOUNTER — Ambulatory Visit
Admission: RE | Admit: 2020-12-23 | Discharge: 2020-12-23 | Disposition: A | Discharge status: Home / Self Care | Payer: PPO | Source: Ambulatory Visit | Attending: Radiation Oncology | Admitting: Radiation Oncology

## 2020-12-23 ENCOUNTER — Telehealth: Payer: Self-pay | Admitting: Pharmacist

## 2020-12-23 DIAGNOSIS — Z510 Encounter for antineoplastic radiation therapy: Secondary | ICD-10-CM | POA: Diagnosis not present

## 2020-12-23 DIAGNOSIS — C61 Malignant neoplasm of prostate: Secondary | ICD-10-CM | POA: Diagnosis not present

## 2020-12-23 NOTE — Progress Notes (Addendum)
Chronic Care Management Pharmacy Assistant   Name: Tyler Allison  MRN: [id number] DOB: 10/31/1940  Reason for Encounter: General Disease State Call   Conditions to be addressed/monitored: [employer], HTN, GERD, HLD  Recent office visits:  12/11/20 Tyler Bear, NP. For insect bite. STARTED Prednisone 10 mg.   Recent consult visits:  12/03/20 Otolaryngology [name], MD . No medication changes.   Hospital visits:  None since 11/04/20  Medications: Outpatient Encounter Medications as of 12/23/2020  Medication Sig Note   acetaminophen (TYLENOL) 500 MG tablet Take 500 mg by mouth every 6 (six) hours as needed for moderate pain.    albuterol (PROAIR HFA) 108 (90 BASE) MCG/ACT inhaler Inhale 2 puffs into the lungs every 4 (four) hours as needed. (Patient taking differently: Inhale 2 puffs into the lungs every 4 (four) hours as needed for shortness of breath or wheezing.)    atorvastatin (LIPITOR) 40 MG tablet TAKE 1 TABLET(40 MG) BY MOUTH DAILY (Patient taking differently: Take 40 mg by mouth daily. evening)    clonazePAM (KLONOPIN) 0.5 MG tablet TAKE 1 TABLET(0.5 MG) BY MOUTH TWICE DAILY AS NEEDED FOR ANXIETY (Patient taking differently: Take 0.5 mg by mouth 2 (two) times daily as needed for anxiety.)    fluticasone (FLONASE) 50 MCG/ACT nasal spray USE 2 SPRAYS IN EACH NOSTRIL EVERY DAY AT NIGHT (Patient taking differently: Place 2 sprays into both nostrils at bedtime.)    Melatonin 10 MG TABS Take 10 mg by mouth at bedtime as needed (sleep).    metoprolol succinate (TOPROL-XL) 25 MG 24 hr tablet Take 1 tablet by mouth daily (Patient taking differently: Take 25 mg by mouth daily. Takes in the evening)    Multiple Vitamins-Minerals (PRESERVISION AREDS 2) CAPS Take 1 capsule by mouth 2 (two) times daily.    OVER THE COUNTER MEDICATION Take 1 capsule by mouth at bedtime as needed (sleep). Relaxium sleep otc supplement    pantoprazole (PROTONIX) 40 MG tablet TAKE 1 TABLET(40 MG) BY  MOUTH TWICE DAILY (Patient taking differently: Take 40 mg by mouth 2 (two) times daily.)    rivaroxaban (XARELTO) 20 MG TABS tablet Take 1 tablet by mouth daily with supper (Patient taking differently: Take 20 mg by mouth daily with supper.)    tamsulosin (FLOMAX) 0.4 MG CAPS capsule Take 1 capsule (0.4 mg total) by mouth daily after supper.    traMADol (ULTRAM) 50 MG tablet Take 50 mg by mouth at bedtime as needed for moderate pain. 10/23/2020: Pt ran out, need to get new refill   No facility-administered encounter medications on file as of 12/23/2020.   GEN CALL: Patient stated he doing good. He stated he has taken 43 of his 68 treatment plans for his prostate cancer. Patient stated his energy is a little low but that is to be expected due to the treatment/radiation. Patient stated he currently taking hormone shots and is not having any negative side effects from them. He stated he's lost 3-4 pounds but his appetite is about the same. He state he has not been sleeping well and has tried a few sleeping medications and they are not working, he stated he is only getting about 5 -6 hours of sleep a night and would like to get more.   Star Rating Drugs: Atorvastatin 40 mg 90 DS 12/13/20  Follow-Up:Pharmacist Review  [name], RMA Clinical Pharmacist Assistant [phone]  10 minutes spent in review, coordination, and documentation.  Reviewed by: [name], PharmD Clinical Pharmacist [phone])  [phone]  

## 2020-12-24 ENCOUNTER — Ambulatory Visit
Admission: RE | Admit: 2020-12-24 | Discharge: 2020-12-24 | Disposition: A | Discharge status: Home / Self Care | Payer: PPO | Source: Ambulatory Visit | Attending: Radiation Oncology | Admitting: Radiation Oncology

## 2020-12-24 DIAGNOSIS — Z510 Encounter for antineoplastic radiation therapy: Secondary | ICD-10-CM | POA: Diagnosis not present

## 2020-12-24 DIAGNOSIS — C61 Malignant neoplasm of prostate: Secondary | ICD-10-CM | POA: Diagnosis not present

## 2020-12-25 ENCOUNTER — Ambulatory Visit
Admission: RE | Admit: 2020-12-25 | Discharge: 2020-12-25 | Disposition: A | Discharge status: Home / Self Care | Payer: PPO | Source: Ambulatory Visit | Attending: Radiation Oncology | Admitting: Radiation Oncology

## 2020-12-25 ENCOUNTER — Other Ambulatory Visit: Payer: Self-pay

## 2020-12-25 DIAGNOSIS — Z510 Encounter for antineoplastic radiation therapy: Secondary | ICD-10-CM | POA: Diagnosis not present

## 2020-12-25 DIAGNOSIS — C61 Malignant neoplasm of prostate: Secondary | ICD-10-CM | POA: Diagnosis not present

## 2020-12-28 ENCOUNTER — Ambulatory Visit
Admission: RE | Admit: 2020-12-28 | Discharge: 2020-12-28 | Disposition: A | Discharge status: Home / Self Care | Payer: PPO | Source: Ambulatory Visit | Attending: Radiation Oncology | Admitting: Radiation Oncology

## 2020-12-28 DIAGNOSIS — C61 Malignant neoplasm of prostate: Secondary | ICD-10-CM | POA: Diagnosis not present

## 2020-12-28 DIAGNOSIS — Z510 Encounter for antineoplastic radiation therapy: Secondary | ICD-10-CM | POA: Diagnosis not present

## 2020-12-29 ENCOUNTER — Ambulatory Visit
Admission: RE | Admit: 2020-12-29 | Discharge: 2020-12-29 | Disposition: A | Discharge status: Home / Self Care | Payer: PPO | Source: Ambulatory Visit | Attending: Radiation Oncology | Admitting: Radiation Oncology

## 2020-12-29 ENCOUNTER — Other Ambulatory Visit: Payer: Self-pay

## 2020-12-29 DIAGNOSIS — Z510 Encounter for antineoplastic radiation therapy: Secondary | ICD-10-CM | POA: Diagnosis not present

## 2020-12-29 DIAGNOSIS — C61 Malignant neoplasm of prostate: Secondary | ICD-10-CM | POA: Diagnosis not present

## 2020-12-30 ENCOUNTER — Ambulatory Visit
Admission: RE | Admit: 2020-12-30 | Discharge: 2020-12-30 | Disposition: A | Discharge status: Home / Self Care | Payer: PPO | Source: Ambulatory Visit | Attending: Radiation Oncology | Admitting: Radiation Oncology

## 2020-12-30 DIAGNOSIS — C61 Malignant neoplasm of prostate: Secondary | ICD-10-CM | POA: Diagnosis not present

## 2020-12-30 DIAGNOSIS — Z510 Encounter for antineoplastic radiation therapy: Secondary | ICD-10-CM | POA: Diagnosis not present

## 2020-12-31 ENCOUNTER — Ambulatory Visit
Admission: RE | Admit: 2020-12-31 | Discharge: 2020-12-31 | Disposition: A | Discharge status: Home / Self Care | Payer: PPO | Source: Ambulatory Visit | Attending: Radiation Oncology | Admitting: Radiation Oncology

## 2020-12-31 DIAGNOSIS — Z510 Encounter for antineoplastic radiation therapy: Secondary | ICD-10-CM | POA: Diagnosis not present

## 2020-12-31 DIAGNOSIS — C61 Malignant neoplasm of prostate: Secondary | ICD-10-CM | POA: Diagnosis not present

## 2021-01-01 ENCOUNTER — Other Ambulatory Visit: Payer: Self-pay

## 2021-01-01 ENCOUNTER — Ambulatory Visit
Admission: RE | Admit: 2021-01-01 | Discharge: 2021-01-01 | Disposition: A | Discharge status: Home / Self Care | Payer: PPO | Source: Ambulatory Visit | Attending: Radiation Oncology | Admitting: Radiation Oncology

## 2021-01-01 DIAGNOSIS — Z510 Encounter for antineoplastic radiation therapy: Secondary | ICD-10-CM | POA: Diagnosis not present

## 2021-01-01 DIAGNOSIS — C61 Malignant neoplasm of prostate: Secondary | ICD-10-CM | POA: Diagnosis not present

## 2021-01-04 ENCOUNTER — Ambulatory Visit
Admission: RE | Admit: 2021-01-04 | Discharge: 2021-01-04 | Disposition: A | Discharge status: Home / Self Care | Payer: PPO | Source: Ambulatory Visit | Attending: Radiation Oncology | Admitting: Radiation Oncology

## 2021-01-04 ENCOUNTER — Other Ambulatory Visit: Payer: Self-pay

## 2021-01-04 DIAGNOSIS — Z510 Encounter for antineoplastic radiation therapy: Secondary | ICD-10-CM | POA: Diagnosis not present

## 2021-01-04 DIAGNOSIS — H353211 Exudative age-related macular degeneration, right eye, with active choroidal neovascularization: Secondary | ICD-10-CM | POA: Diagnosis not present

## 2021-01-04 DIAGNOSIS — C61 Malignant neoplasm of prostate: Secondary | ICD-10-CM | POA: Diagnosis not present

## 2021-01-05 ENCOUNTER — Ambulatory Visit
Admission: RE | Admit: 2021-01-05 | Discharge: 2021-01-05 | Disposition: A | Discharge status: Home / Self Care | Payer: PPO | Source: Ambulatory Visit | Attending: Radiation Oncology | Admitting: Radiation Oncology

## 2021-01-05 DIAGNOSIS — C61 Malignant neoplasm of prostate: Secondary | ICD-10-CM | POA: Diagnosis not present

## 2021-01-05 DIAGNOSIS — Z510 Encounter for antineoplastic radiation therapy: Secondary | ICD-10-CM | POA: Diagnosis not present

## 2021-01-06 ENCOUNTER — Ambulatory Visit
Admission: RE | Admit: 2021-01-06 | Discharge: 2021-01-06 | Disposition: A | Discharge status: Home / Self Care | Payer: PPO | Source: Ambulatory Visit | Attending: Radiation Oncology | Admitting: Radiation Oncology

## 2021-01-06 ENCOUNTER — Other Ambulatory Visit: Payer: Self-pay

## 2021-01-06 ENCOUNTER — Ambulatory Visit: Payer: PPO

## 2021-01-06 DIAGNOSIS — Z510 Encounter for antineoplastic radiation therapy: Secondary | ICD-10-CM | POA: Diagnosis not present

## 2021-01-06 DIAGNOSIS — C61 Malignant neoplasm of prostate: Secondary | ICD-10-CM | POA: Diagnosis not present

## 2021-01-07 ENCOUNTER — Encounter: Payer: Self-pay | Admitting: Urology

## 2021-01-07 ENCOUNTER — Ambulatory Visit
Admission: RE | Admit: 2021-01-07 | Discharge: 2021-01-07 | Disposition: A | Discharge status: Home / Self Care | Payer: PPO | Source: Ambulatory Visit | Attending: Radiation Oncology | Admitting: Radiation Oncology

## 2021-01-07 DIAGNOSIS — C61 Malignant neoplasm of prostate: Secondary | ICD-10-CM | POA: Diagnosis not present

## 2021-01-07 DIAGNOSIS — Z510 Encounter for antineoplastic radiation therapy: Secondary | ICD-10-CM | POA: Diagnosis not present

## 2021-01-08 ENCOUNTER — Other Ambulatory Visit: Payer: Self-pay | Admitting: Family Medicine

## 2021-01-13 DIAGNOSIS — H353221 Exudative age-related macular degeneration, left eye, with active choroidal neovascularization: Secondary | ICD-10-CM | POA: Diagnosis not present

## 2021-01-19 ENCOUNTER — Ambulatory Visit (INDEPENDENT_AMBULATORY_CARE_PROVIDER_SITE_OTHER): Payer: PPO | Admitting: *Deleted

## 2021-01-19 ENCOUNTER — Other Ambulatory Visit: Payer: Self-pay

## 2021-01-19 DIAGNOSIS — Z23 Encounter for immunization: Secondary | ICD-10-CM

## 2021-02-02 DIAGNOSIS — H35453 Secondary pigmentary degeneration, bilateral: Secondary | ICD-10-CM | POA: Diagnosis not present

## 2021-02-02 DIAGNOSIS — H43813 Vitreous degeneration, bilateral: Secondary | ICD-10-CM | POA: Diagnosis not present

## 2021-02-02 DIAGNOSIS — H35363 Drusen (degenerative) of macula, bilateral: Secondary | ICD-10-CM | POA: Diagnosis not present

## 2021-02-02 DIAGNOSIS — Z961 Presence of intraocular lens: Secondary | ICD-10-CM | POA: Diagnosis not present

## 2021-02-02 DIAGNOSIS — H353231 Exudative age-related macular degeneration, bilateral, with active choroidal neovascularization: Secondary | ICD-10-CM | POA: Diagnosis not present

## 2021-02-09 NOTE — Progress Notes (Signed)
HPI M former smoker followed for dyspnea, cough  Asthma, complicated by HBP, Hypercholesterolemia, PAFib, Lung nodule(2012), Cervical spondylosis, Dysphagia Labs- nl CMET, BNP and Hgb 08/28/2018 Echo 08/28/2018- EF 55-60%, no wall motion abnl or PH PFT 01/15/2019- Minimal obstruction, no resp to BD, Nl lung volumes, Nl Diffusion CTa chest 06/21/19- Aortic atherosclerosis, lungs clear 6MWT 10/30/19- 425 meters, lowest O2 sat on room air 95%, max HR 80 HST 12/04/19- AHI 11.8/ hr MOSTLY CENTRAL APNEAS, desaturation to 85%/ average 93%, body weight 203 lbs ONOX 08/17/20- 12-13 minutes </= 88%. Likely REM associated. ------------------------------------------------------------------------   08/11/20- 79 yoM former smoker followed for dyspnea, cough, hx Nocturnal Hypoxemia,  Sleep Apnea/ Central, Asthma, complicated by HBP, Hyperlipidemia, PAFib/ Xarelto, Lung nodule(2012), Cervical spondylosis, Dysphagia, Prostate Cancer, Proair, Flonase, Symbicort 160/ Anoro sample, Zyrtec Covid vax-3 Phizer Flu vax-had - Plan was to remain off O2 for sleep and recheck [place] on return ------Patient is feeling good overall, has been diagnosed with prostate cancer. No longer using oxygen. Breathing comfortable at night. Morning cough productive white phlegm till airways clear. Stable DOE if he is very active. No acute changes.  Using rescue inhaler only occasionally. Neither Anoro or Symbicort seemed to make much difference. Not needing refills  Now dx'd prostate cancer - pending Urology f/u to consider options.   02/10/21- 80 yoM former smoker( 6.5 pk yrs) followed for Chronic Bronchitis, dyspnea, cough, hx Nocturnal Hypoxemia, Sleep Apnea/ Central, Asthma, complicated by HBP, Hyperlipidemia, PAFib/ Xarelto, Lung nodule(2012), Cervical spondylosis, Dysphagia, Prostate Cancer, -Proair, Flonase, Symbicort 160/ Anoro sample, Zyrtec Covid vax-3 Phizer Flu vax-had Body weight today-203 lbs Dr Redmond Baseman ENT treated for  hoarsenesss and hemoptysis with resection of varix and injection of cords for bowing. Voice is better. Relieved to finish XRT for prostate cancer.  Morning cough productive clear mucus, then mostly good through day.  DOE has improved- somewhat random- his PCP questioned "anxiety". Rescue inhaler used only occasionally- does help. Other inhalers including Symbicort don't seem to make a difference.  CXR 08/11/20-  IMPRESSION: Mild bilateral peribronchial cuffing. Bronchitis could present this fashion. Low lung volumes with mild bibasilar atelectasis.   ROS-see HPI  + = positive Constitutional:    weight loss, night sweats, fevers, chills, fatigue, lassitude. HEENT:    headaches, difficulty swallowing, tooth/dental problems, sore throat,       sneezing, itching, ear ache, nasal congestion, post nasal drip, snoring CV:    chest pain, orthopnea, PND, swelling in lower extremities, anasarca,                                   dizziness, palpitations Resp:   +shortness of breath with exertion or at rest.                +productive cough,   +non-productive cough, coughing up of blood.              change in color of mucus.  wheezing.   Skin:    rash or lesions. GI:  No-   heartburn, indigestion, abdominal pain, nausea, vomiting, diarrhea,                 change in bowel habits, loss of appetite GU: dysuria, change in color of urine, no urgency or frequency.   flank pain. MS:   joint pain, stiffness, decreased range of motion, back pain. Neuro-     nothing unusual Psych:  change in mood or affect.  depression or anxiety.   memory loss.  OBJ- Physical Exam   Arrival room air sat 95% General- Alert, Oriented, Affect-appropriate, Distress- none acute,  Skin- rash-none, lesions- none, excoriation- none Lymphadenopathy- none Head- atraumatic            Eyes- Gross vision intact, PERRLA, conjunctivae and secretions clear            Ears- Hearing, canals-normal            Nose- Clear, no-Septal dev,  mucus, polyps, erosion, perforation             Throat- Mallampati II , mucosa clear , drainage- none, tonsils- atrophic,  Neck- flexible , trachea midline, no stridor , thyroid nl, carotid no bruit Chest - symmetrical excursion , unlabored           Heart/CV- RRR faint , no murmur , no gallop  , no rub, nl s1 s2                           - JVD+1 cm , edema- none, stasis changes- none, varices- none           Lung- +clear, wheeze- none, cough- none , dullness-none, rub- none           Chest wall-  Abd-  Br/ Gen/ Rectal- Not done, not indicated Extrem- cyanosis- none, clubbing, none, atrophy- none, strength- nl Neuro- grossly intact to observation

## 2021-02-10 ENCOUNTER — Ambulatory Visit: Payer: PPO | Admitting: Internal Medicine

## 2021-02-10 ENCOUNTER — Other Ambulatory Visit: Payer: Self-pay

## 2021-02-10 ENCOUNTER — Encounter: Payer: Self-pay | Admitting: Internal Medicine

## 2021-02-10 DIAGNOSIS — G4734 Idiopathic sleep related nonobstructive alveolar hypoventilation: Secondary | ICD-10-CM | POA: Diagnosis not present

## 2021-02-10 DIAGNOSIS — J4540 Moderate persistent asthma, uncomplicated: Secondary | ICD-10-CM

## 2021-02-10 DIAGNOSIS — G4731 Primary central sleep apnea: Secondary | ICD-10-CM | POA: Diagnosis not present

## 2021-02-10 NOTE — Patient Instructions (Addendum)
Ok to use your albuterol rescue inhaler if needed  Please call if we can help

## 2021-02-10 NOTE — Assessment & Plan Note (Addendum)
Discussed. He feels he is sleeping well, CSA likely reflects some feed-back delay due to cardiovascular disease. Now it is not causing symptoms. Plan- repeat sleep study in future as needed. Continue to work with cardiology.

## 2021-02-10 NOTE — Assessment & Plan Note (Signed)
Stable pattern. Doubt sleep O2 would make enough difference to bother with for him now, but will watch.

## 2021-02-10 NOTE — Assessment & Plan Note (Signed)
He feels well controlled with just occ use of albuterol rescue. Plan- watch need to resume maintenance, possibly a triple.

## 2021-02-11 ENCOUNTER — Ambulatory Visit (INDEPENDENT_AMBULATORY_CARE_PROVIDER_SITE_OTHER): Payer: PPO

## 2021-02-11 VITALS — Ht 71.0 in | Wt 203.0 lb

## 2021-02-11 DIAGNOSIS — I10 Essential (primary) hypertension: Secondary | ICD-10-CM | POA: Diagnosis not present

## 2021-02-11 DIAGNOSIS — M25512 Pain in left shoulder: Secondary | ICD-10-CM | POA: Diagnosis not present

## 2021-02-11 DIAGNOSIS — E782 Mixed hyperlipidemia: Secondary | ICD-10-CM

## 2021-02-11 DIAGNOSIS — Z0001 Encounter for general adult medical examination with abnormal findings: Secondary | ICD-10-CM

## 2021-02-11 DIAGNOSIS — Z0000 Encounter for general adult medical examination without abnormal findings: Secondary | ICD-10-CM

## 2021-02-11 DIAGNOSIS — I480 Paroxysmal atrial fibrillation: Secondary | ICD-10-CM | POA: Diagnosis not present

## 2021-02-11 NOTE — Progress Notes (Signed)
Subjective:   Tyler Allison is a 80 y.o. male who presents for Medicare Annual/Subsequent preventive examination. Virtual Visit via Telephone Note  I connected with  Tyler Allison on 02/11/21 at 12:00 PM EDT by telephone and verified that I am speaking with the correct person using two identifiers.  Location: Patient: HOME  Provider:[facility] Persons participating in the virtual visit: patient/Nurse Health Advisor   I discussed the limitations, risks, security and privacy concerns of performing an evaluation and management service by telephone and the availability of in person appointments. The patient expressed understanding and agreed to proceed.  Interactive audio and video telecommunications were attempted between this nurse and patient, however failed, due to patient having technical difficulties OR patient did not have access to video capability.  We continued and completed visit with audio only.  Some vital signs may be absent or patient reported.   Chriss Driver, LPN  Review of Systems     Cardiac Risk Factors include: advanced age (>33men, >5 women);hypertension;male gender;sedentary lifestyle     Objective:    Today's Vitals   02/11/21 1201 02/11/21 1202  Weight: 203 lb (92.1 kg)   Height: 5\' 11"  (1.803 m)   PainSc:  5    Body mass index is 28.31 kg/m.  Advanced Directives 02/11/2021 10/08/2020 08/31/2020 08/05/2020 03/23/2020  Does Patient Have a Medical Advance Directive? Yes No No Yes Yes  Type of Paramedic of [place];Living will - - Special educational needs teacher of [place];Out of facility DNR (pink MOST or yellow form);Living will  Does patient want to make changes to medical advance directive? - - - No - Patient declined -  Copy of [place] in Chart? No - copy requested - - No - copy requested -  Would patient like information on creating a medical advance directive? No - Patient declined No -  Patient declined No - Patient declined - -    Current Medications (verified) Outpatient Encounter Medications as of 02/11/2021  Medication Sig   acetaminophen (TYLENOL) 500 MG tablet Take 500 mg by mouth every 6 (six) hours as needed for moderate pain.   albuterol (PROAIR HFA) 108 (90 BASE) MCG/ACT inhaler Inhale 2 puffs into the lungs every 4 (four) hours as needed. (Patient taking differently: Inhale 2 puffs into the lungs every 4 (four) hours as needed for shortness of breath or wheezing.)   atorvastatin (LIPITOR) 40 MG tablet TAKE 1 TABLET(40 MG) BY MOUTH DAILY (Patient taking differently: Take 40 mg by mouth daily. evening)   clonazePAM (KLONOPIN) 0.5 MG tablet TAKE 1 TABLET(0.5 MG) BY MOUTH TWICE DAILY AS NEEDED FOR ANXIETY (Patient taking differently: Take 0.5 mg by mouth 2 (two) times daily as needed for anxiety.)   Melatonin 10 MG TABS Take 10 mg by mouth at bedtime as needed (sleep).   metoprolol succinate (TOPROL-XL) 25 MG 24 hr tablet Take 1 tablet by mouth daily   Multiple Vitamins-Minerals (PRESERVISION AREDS 2) CAPS Take 1 capsule by mouth 2 (two) times daily.   OVER THE COUNTER MEDICATION Take 1 capsule by mouth at bedtime as needed (sleep). Relaxium sleep otc supplement   pantoprazole (PROTONIX) 40 MG tablet TAKE 1 TABLET(40 MG) BY MOUTH TWICE DAILY (Patient taking differently: Take 40 mg by mouth 2 (two) times daily.)   rivaroxaban (XARELTO) 20 MG TABS tablet Take 1 tablet by mouth daily with supper (Patient taking differently: Take 20 mg by mouth daily with supper.)   tamsulosin (  FLOMAX) 0.4 MG CAPS capsule Take 1 capsule (0.4 mg total) by mouth daily after supper.   traMADol (ULTRAM) 50 MG tablet Take 50 mg by mouth at bedtime as needed for moderate pain.   fluticasone (FLONASE) 50 MCG/ACT nasal spray USE 2 SPRAYS IN EACH NOSTRIL EVERY DAY AT NIGHT (Patient taking differently: Place 2 sprays into both nostrils at bedtime.)   No facility-administered encounter medications on  file as of 02/11/2021.    Allergies (verified) Codeine   History: Past Medical History:  Diagnosis Date   Allergy    Anxiety    Asthma    Cancer ([place])    prostate cancer   Cervical spondylosis with radiculopathy    left c6 radiculopathy   Chronic back pain    GERD (gastroesophageal reflux disease)    Hyperlipidemia    Hypertension    PAF (paroxysmal atrial fibrillation) (HCC)    Prostate cancer (HCC)    Past Surgical History:  Procedure Laterality Date   BACK SURGERY     MICROLARYNGOSCOPY W/VOCAL CORD INJECTION N/A 10/30/2020   Procedure: MICRODIRECT LARYNGOSCOPY WITH VOCAL CORD INJECTION AND CO2 LASER APPLICATION;  Surgeon: Melida Quitter, MD;  Location: [facility];  Service: ENT;  Laterality: N/A;   PROSTATE BIOPSY     SPINE SURGERY     back surgery x 3 including lumbar fusion, neck surgery x 2 including C4-6 ACDF   tympanoplasty with mastoidectomy Left 01/13/2004   Family History  Problem Relation Age of Onset   Heart failure Mother    Esophageal cancer Father    Heart attack Maternal Uncle    Breast cancer Neg Hx    Prostate cancer Neg Hx    Pancreatic cancer Neg Hx    Colon cancer Neg Hx    Social History   Socioeconomic History   Marital status: Married    Spouse name: Not on file   Number of children: 2   Years of education: Not on file   Highest education level: Not on file  Occupational History    Comment: retired  Tobacco Use   Smoking status: Former    Packs/day: 0.50    Years: 13.00    Pack years: 6.50    Types: Cigarettes    Start date: 66    Quit date: 10/23/1968    Years since quitting: 52.3   Smokeless tobacco: Never  Vaping Use   Vaping Use: Never used  Substance and Sexual Activity   Alcohol use: No   Drug use: No   Sexual activity: Not Currently    Comment: married, retired.  Other Topics Concern   Not on file  Social History Narrative   2 children but 1 deceased   Social Determinants of Health   Financial Resource Strain: Low  Risk    Difficulty of Paying Living Expenses: Not hard at all  Food Insecurity: No Food Insecurity   Worried About Charity fundraiser in the Last Year: Never true   Arboriculturist in the Last Year: Never true  Transportation Needs: No Transportation Needs   Lack of Transportation (Medical): No   Lack of Transportation (Non-Medical): No  Physical Activity: Insufficiently Active   Days of Exercise per Week: 5 days   Minutes of Exercise per Session: 20 min  Stress: No Stress Concern Present   Feeling of Stress : Not at all  Social Connections: Socially Integrated   Frequency of Communication with Friends and Family: More than three times a week  Frequency of Social Gatherings with Friends and Family: More than three times a week   Attends Religious Services: More than 4 times per year   Active Member of Clubs or Organizations: Yes   Attends Music therapist: More than 4 times per year   Marital Status: Married    Tobacco Counseling Counseling given: Not Answered   Clinical Intake:  Pre-visit preparation completed: Yes  Pain : 0-10 Pain Score: 5  Pain Type: Chronic pain Pain Location: Shoulder Pain Descriptors / Indicators: Aching Pain Onset: More than a month ago Pain Frequency: Intermittent     BMI - recorded: 28.31 Nutritional Status: BMI 25 -29 Overweight Nutritional Risks: None  How often do you need to have someone help you when you read instructions, pamphlets, or other written materials from your doctor or pharmacy?: 1 - Never  Diabetic?no  Interpreter Needed?: No  Information entered by :: MJ Montez Cuda, LPN   Activities of Daily Living In your present state of health, do you have any difficulty performing the following activities: 02/11/2021 10/30/2020  Hearing? N Y  Comment - some hearing loss in left ear  Vision? N N  Difficulty concentrating or making decisions? N N  Walking or climbing stairs? N N  Dressing or bathing? N N  Doing  errands, shopping? N -  Preparing Food and eating ? N -  Using the Toilet? N -  In the past six months, have you accidently leaked urine? N -  Do you have problems with loss of bowel control? N -  Managing your Medications? N -  Managing your Finances? N -  Housekeeping or managing your Housekeeping? N -  Some recent data might be hidden    Patient Care Team: [name], MD as PCP - General (Family Medicine) Jettie Booze, MD as PCP - Cardiology (Cardiology) Edythe Clarity, [facility] as Pharmacist (Pharmacist) Cira Rue, RN as Oncology Nurse Navigator  Indicate any recent Medical Services you may have received from other than [facility] providers in the past year (date may be approximate).     Assessment:   This is a routine wellness examination for Tyler Allison.  Hearing/Vision screen Hearing Screening - Comments:: No hearing issues.  Vision Screening - Comments:: Glasses. Dr. Kathrin Penner. Sees Dr. Satira Sark in 03/2021.  Dietary issues and exercise activities discussed: Current Exercise Habits: Home exercise routine, Type of exercise: walking, Time (Minutes): 20, Frequency (Times/Week): 5, Weekly Exercise (Minutes/Week): 100, Intensity: Mild, Exercise limited by: cardiac condition(s)   Goals Addressed             This Visit's Progress    Exercise 3x per week (30 min per time)       Pt is working on losing weight.        Depression Screen PHQ 2/9 Scores 02/11/2021 02/11/2021 03/23/2020 02/01/2018 11/30/2017 11/30/2017 04/19/2017  PHQ - 2 Score 0 0 0 0 0 0 0    Fall Risk Fall Risk  02/11/2021 03/23/2020 04/01/2019 02/01/2018 11/30/2017  Falls in the past year? 0 0 0 No No  Comment - - Emmi Telephone Survey: data to providers prior to load - -  Number falls in past yr: 0 0 - - -  Injury with Fall? 0 0 - - -  Risk for fall due to : Impaired vision - - - -  Follow up Falls prevention discussed - - - -    FALL RISK PREVENTION PERTAINING TO THE HOME:  Any stairs in or around  the  home? No  If so, are there any without handrails? No  Home free of loose throw rugs in walkways, pet beds, electrical cords, etc? Yes  Adequate lighting in your home to reduce risk of falls? Yes   ASSISTIVE DEVICES UTILIZED TO PREVENT FALLS:  Life alert? No  Use of a cane, walker or w/c? No  Grab bars in the bathroom? Yes  Shower chair or bench in shower? Yes  Elevated toilet seat or a handicapped toilet? Yes   TIMED UP AND GO:  Was the test performed? No . Phone visit.   Cognitive Function:     6CIT Screen 02/11/2021 03/23/2020  What Year? 0 points 0 points  What month? 0 points 0 points  What time? 0 points 0 points  Count back from 20 0 points 0 points  Months in reverse 0 points 0 points  Repeat phrase 0 points 0 points  Total Score 0 0    Immunizations Immunization History  Administered Date(s) Administered   Fluad Quad(high Dose 65+) 12/28/2018, 01/30/2020   Influenza Whole 02/09/2010   Influenza, High Dose Seasonal PF 02/15/2017, 02/01/2018   Influenza,inj,Quad PF,6+ Mos 02/13/2013, 02/25/2014, 03/13/2015, 02/11/2016, 01/19/2021   Influenza,inj,quad, With Preservative 02/06/2018   PFIZER(Purple Top)SARS-COV-2 Vaccination 06/20/2019, 07/13/2019, 05/11/2020   Pneumococcal Conjugate-13 03/14/2014   Pneumococcal Polysaccharide-23 05/09/2004, 12/05/2017   Td 01/07/2005   Zoster, Live 08/08/2006    TDAP status: Due, Education has been provided regarding the importance of this vaccine. Advised may receive this vaccine at local pharmacy or Health Dept. Aware to provide a copy of the vaccination record if obtained from local pharmacy or Health Dept. Verbalized acceptance and understanding.  Flu Vaccine status: Up to date  Pneumococcal vaccine status: Up to date  Covid-19 vaccine status: Information provided on how to obtain vaccines.   Qualifies for Shingles Vaccine? Yes   Zostavax completed Yes   Shingrix Completed?: No.    Education has been provided  regarding the importance of this vaccine. Patient has been advised to call insurance company to determine out of pocket expense if they have not yet received this vaccine. Advised may also receive vaccine at local pharmacy or Health Dept. Verbalized acceptance and understanding.  Screening Tests Health Maintenance  Topic Date Due   Zoster Vaccines- Shingrix (1 of 2) Never done   TETANUS/TDAP  01/08/2015   COVID-19 Vaccine (4 - Booster for [employer] series) 08/03/2020   INFLUENZA VACCINE  Completed   HPV VACCINES  Aged Out    Health Maintenance  Health Maintenance Due  Topic Date Due   Zoster Vaccines- Shingrix (1 of 2) Never done   TETANUS/TDAP  01/08/2015   COVID-19 Vaccine (4 - Booster for Pfizer series) 08/03/2020    Colorectal cancer screening: Type of screening: Colonoscopy. Completed 11/04/2014. Repeat every No longer required due to age years  Lung Cancer Screening: (Low Dose CT Chest recommended if Age 61-80 years, 30 pack-year currently smoking OR have quit w/in 15years.) does not qualify.     Additional Screening:  Hepatitis C Screening: does not qualify;  Vision Screening: Recommended annual ophthalmology exams for early detection of glaucoma and other disorders of the eye. Is the patient up to date with their annual eye exam?  Yes  Who is the provider or what is the name of the office in which the patient attends annual eye exams? Dr. Satira Sark, sees 11/22. If pt is not established with a provider, would they like to be referred to a provider to establish care? No .  Dental Screening: Recommended annual dental exams for proper oral hygiene  Community Resource Referral / Chronic Care Management: CRR required this visit?  No   CCM required this visit?  No      Plan:     I have personally reviewed and noted the following in the patient's chart:   Medical and social history Use of alcohol, tobacco or illicit drugs  Current medications and supplements including  opioid prescriptions. Patient is not currently taking opioid prescriptions. Functional ability and status Nutritional status Physical activity Advanced directives List of other physicians Hospitalizations, surgeries, and ER visits in previous 12 months Vitals Screenings to include cognitive, depression, and falls Referrals and appointments  In addition, I have reviewed and discussed with patient certain preventive protocols, quality metrics, and best practice recommendations. A written personalized care plan for preventive services as well as general preventive health recommendations were provided to patient.     Chriss Driver, LPN   45/10/2561   Nurse Notes: Pt c/o shoulder pain. Seeing orthopedic MD today for possible injection. Pt c/o issues with cost of Xarelto. CCM referral offered and pt is agreeable. Has eye exam scheduled next month.

## 2021-02-11 NOTE — Patient Instructions (Signed)
Mr. Bley , Thank you for taking time to come for your Medicare Wellness Visit. I appreciate your ongoing commitment to your health goals. Please review the following plan we discussed and let me know if I can assist you in the future.   Screening recommendations/referrals: Colonoscopy: 11/04/2014, no longer required due to age. Recommended yearly ophthalmology/optometry visit for glaucoma screening and checkup Recommended yearly dental visit for hygiene and checkup  Vaccinations: Influenza vaccine: 01/19/21 Repeat annually  Pneumococcal vaccine: 03/14/2014 and 12/05/2017 Tdap vaccine: 01/07/2005 Repeat in 10 years  Shingles vaccine: Shingrix discussed. Please contact your pharmacy for coverage information.     Covid-19: 06/20/19, 07/13/19, 05/11/20.  Advanced directives: Please bring a copy of your health care power of attorney and living will to the office to be added to your chart at your convenience.   Conditions/risks identified: Aim for 30 minutes of exercise or  walking each day, drink 6-8 glasses of water and eat lots of fruits and vegetables.   Next appointment: Follow up in one year for your annual wellness visit. 2023.  Preventive Care 57 Years and Older, Male  Preventive care refers to lifestyle choices and visits with your health care provider that can promote health and wellness. What does preventive care include? A yearly physical exam. This is also called an annual well check. Dental exams once or twice a year. Routine eye exams. Ask your health care provider how often you should have your eyes checked. Personal lifestyle choices, including: Daily care of your teeth and gums. Regular physical activity. Eating a healthy diet. Avoiding tobacco and drug use. Limiting alcohol use. Practicing safe sex. Taking low doses of aspirin every day. Taking vitamin and mineral supplements as recommended by your health care provider. What happens during an annual well check? The  services and screenings done by your health care provider during your annual well check will depend on your age, overall health, lifestyle risk factors, and family history of disease. Counseling  Your health care provider may ask you questions about your: Alcohol use. Tobacco use. Drug use. Emotional well-being. Home and relationship well-being. Sexual activity. Eating habits. History of falls. Memory and ability to understand (cognition). Work and work Statistician. Screening  You may have the following tests or measurements: Height, weight, and BMI. Blood pressure. Lipid and cholesterol levels. These may be checked every 5 years, or more frequently if you are over 54 years old. Skin check. Lung cancer screening. You may have this screening every year starting at age 68 if you have a 30-pack-year history of smoking and currently smoke or have quit within the past 15 years. Fecal occult blood test (FOBT) of the stool. You may have this test every year starting at age 60. Flexible sigmoidoscopy or colonoscopy. You may have a sigmoidoscopy every 5 years or a colonoscopy every 10 years starting at age 69. Prostate cancer screening. Recommendations will vary depending on your family history and other risks. Hepatitis C blood test. Hepatitis B blood test. Sexually transmitted disease (STD) testing. Diabetes screening. This is done by checking your blood sugar (glucose) after you have not eaten for a while (fasting). You may have this done every 1-3 years. Abdominal aortic aneurysm (AAA) screening. You may need this if you are a current or former smoker. Osteoporosis. You may be screened starting at age 68 if you are at high risk. Talk with your health care provider about your test results, treatment options, and if necessary, the need for more tests. Vaccines  Your health care provider may recommend certain vaccines, such as: Influenza vaccine. This is recommended every year. Tetanus,  diphtheria, and acellular pertussis (Tdap, Td) vaccine. You may need a Td booster every 10 years. Zoster vaccine. You may need this after age 79. Pneumococcal 13-valent conjugate (PCV13) vaccine. One dose is recommended after age 12. Pneumococcal polysaccharide (PPSV23) vaccine. One dose is recommended after age 73. Talk to your health care provider about which screenings and vaccines you need and how often you need them. This information is not intended to replace advice given to you by your health care provider. Make sure you discuss any questions you have with your health care provider. Document Released: 05/22/2015 Document Revised: 01/13/2016 Document Reviewed: 02/24/2015 Elsevier Interactive Patient Education  2017 [place] Prevention in the Home Falls can cause injuries. They can happen to people of all ages. There are many things you can do to make your home safe and to help prevent falls. What can I do on the outside of my home? Regularly fix the edges of walkways and driveways and fix any cracks. Remove anything that might make you trip as you walk through a door, such as a raised step or threshold. Trim any bushes or trees on the path to your home. Use bright outdoor lighting. Clear any walking paths of anything that might make someone trip, such as rocks or tools. Regularly check to see if handrails are loose or broken. Make sure that both sides of any steps have handrails. Any raised decks and porches should have guardrails on the edges. Have any leaves, snow, or ice cleared regularly. Use sand or salt on walking paths during winter. Clean up any spills in your garage right away. This includes oil or grease spills. What can I do in the bathroom? Use night lights. Install grab bars by the toilet and in the tub and shower. Do not use towel bars as grab bars. Use non-skid mats or decals in the tub or shower. If you need to sit down in the shower, use a plastic,  non-slip stool. Keep the floor dry. Clean up any water that spills on the floor as soon as it happens. Remove soap buildup in the tub or shower regularly. Attach bath mats securely with double-sided non-slip rug tape. Do not have throw rugs and other things on the floor that can make you trip. What can I do in the bedroom? Use night lights. Make sure that you have a light by your bed that is easy to reach. Do not use any sheets or blankets that are too big for your bed. They should not hang down onto the floor. Have a firm chair that has side arms. You can use this for support while you get dressed. Do not have throw rugs and other things on the floor that can make you trip. What can I do in the kitchen? Clean up any spills right away. Avoid walking on wet floors. Keep items that you use a lot in easy-to-reach places. If you need to reach something above you, use a strong step stool that has a grab bar. Keep electrical cords out of the way. Do not use floor polish or wax that makes floors slippery. If you must use wax, use non-skid floor wax. Do not have throw rugs and other things on the floor that can make you trip. What can I do with my stairs? Do not leave any items on the stairs. Make sure that there are  handrails on both sides of the stairs and use them. Fix handrails that are broken or loose. Make sure that handrails are as long as the stairways. Check any carpeting to make sure that it is firmly attached to the stairs. Fix any carpet that is loose or worn. Avoid having throw rugs at the top or bottom of the stairs. If you do have throw rugs, attach them to the floor with carpet tape. Make sure that you have a light switch at the top of the stairs and the bottom of the stairs. If you do not have them, ask someone to add them for you. What else can I do to help prevent falls? Wear shoes that: Do not have high heels. Have rubber bottoms. Are comfortable and fit you well. Are closed  at the toe. Do not wear sandals. If you use a stepladder: Make sure that it is fully opened. Do not climb a closed stepladder. Make sure that both sides of the stepladder are locked into place. Ask someone to hold it for you, if possible. Clearly mark and make sure that you can see: Any grab bars or handrails. First and last steps. Where the edge of each step is. Use tools that help you move around (mobility aids) if they are needed. These include: Canes. Walkers. Scooters. Crutches. Turn on the lights when you go into a dark area. Replace any light bulbs as soon as they burn out. Set up your furniture so you have a clear path. Avoid moving your furniture around. If any of your floors are uneven, fix them. If there are any pets around you, be aware of where they are. Review your medicines with your doctor. Some medicines can make you feel dizzy. This can increase your chance of falling. Ask your doctor what other things that you can do to help prevent falls. This information is not intended to replace advice given to you by your health care provider. Make sure you discuss any questions you have with your health care provider. Document Released: 02/19/2009 Document Revised: 10/01/2015 Document Reviewed: 05/30/2014 Elsevier Interactive Patient Education  2017 [employer].

## 2021-02-15 DIAGNOSIS — H353221 Exudative age-related macular degeneration, left eye, with active choroidal neovascularization: Secondary | ICD-10-CM | POA: Diagnosis not present

## 2021-02-16 ENCOUNTER — Encounter: Payer: Self-pay | Admitting: Urology

## 2021-02-16 NOTE — Progress Notes (Signed)
Patient states doing well. No symptoms reported at this time. Flomax 0.4mg  taken as directed. Urologist follow up scheduled for late November -Per patient.   I-PSS Score of 3 (mild).  Meaningful use complete.  Patient notified of 10:30am-02/17/21 telephone appointment and verbalized understanding.

## 2021-02-17 ENCOUNTER — Ambulatory Visit
Admission: RE | Admit: 2021-02-17 | Discharge: 2021-02-17 | Disposition: A | Discharge status: Home / Self Care | Payer: PPO | Source: Ambulatory Visit | Attending: Radiation Oncology | Admitting: Radiation Oncology

## 2021-02-17 DIAGNOSIS — C61 Malignant neoplasm of prostate: Secondary | ICD-10-CM | POA: Insufficient documentation

## 2021-02-17 NOTE — Progress Notes (Signed)
  Radiation Oncology         (336) [phone] ________________________________  Name: Tyler Allison MRN: [id number]  Date: 01/07/2021  DOB: August 08, 1940  End of Treatment Note  Diagnosis:   80 y.o. gentleman with stage T1c adenocarcinoma of the prostate with a Gleason's score of 4+4 and a PSA of 8.76     Indication for treatment:  Curative, Definitive Radiotherapy       Radiation treatment dates:   11/12/20 - 01/07/21 (concurrent with ADT- started 09/11/20)  Site/dose:  1. The prostate, seminal vesicles, and pelvic lymph nodes were initially treated to 45 Gy in 25 fractions of 1.8 Gy  2. The prostate only was boosted to 75 Gy with 15 additional fractions of 2.0 Gy   Beams/energy:  1. The prostate, seminal vesicles, and pelvic lymph nodes were initially treated using VMAT intensity modulated radiotherapy delivering 6 megavolt photons. Image guidance was performed with CB-CT studies prior to each fraction. He was immobilized with a body fix lower extremity mold.  2. the prostate only was boosted using VMAT intensity modulated radiotherapy delivering 6 megavolt photons. Image guidance was performed with CB-CT studies prior to each fraction. He was immobilized with a body fix lower extremity mold.  Narrative: The patient tolerated radiation treatment relatively well with only minor urinary irritation and modest fatigue.  He did report occasional dysuria, hesitancy, intermittency and nocturia 2-4 times per night while taking Flomax daily as prescribed.  He also had some loose stools but denied frank diarrhea.  Plan: The patient has completed radiation treatment. He will return to radiation oncology clinic for routine followup in one month. I advised him to call or return sooner if he has any questions or concerns related to his recovery or treatment. ________________________________  Sheral Apley. Tammi Klippel, M.D.

## 2021-02-17 NOTE — Progress Notes (Signed)
Radiation Oncology         (336) [phone] ________________________________  Name: Tyler Allison MRN: [id number]  Date: 02/17/2021  DOB: 15-Sep-1940  Post Treatment Note  CC: [name], MD  Raynelle Bring, MD  Diagnosis:   80 y.o. gentleman with stage T1c adenocarcinoma of the prostate with a Gleason's score of 4+4 and a PSA of 8.76     Interval Since Last Radiation:  6 weeks  11/12/20 - 01/07/21 (concurrent with ADT- started 09/11/20) 1. The prostate, seminal vesicles, and pelvic lymph nodes were initially treated to 45 Gy in 25 fractions of 1.8 Gy  2. The prostate only was boosted to 75 Gy with 15 additional fractions of 2.0 Gy   Narrative:  I spoke with the patient to conduct his routine scheduled 1 month follow up visit via telephone to spare the patient unnecessary potential exposure in the healthcare setting during the current COVID-19 pandemic.  The patient was notified in advance and gave permission to proceed with this visit format. He tolerated radiation treatment relatively well with only minor urinary irritation and modest fatigue.  He did report occasional dysuria, hesitancy, intermittency and nocturia 2-4 times per night while taking Flomax daily as prescribed.  He also had some loose stools but denied frank diarrhea.                              On review of systems, the patient states that he is doing well in general.  He reports a gradual improvement in his LUTS and is almost back to his baseline but still getting up 3 times per night to void.  He is not sure if he is waking up because he needs to go to the bathroom or if he is waking up because of the hot flashes associated with his ADT.  He has just recently started Effexor to help with the hot flashes and he feels like he can already appreciate some improvement.  He denies any abdominal pain, nausea, vomiting, diarrhea or constipation.  He reports a healthy appetite and is maintaining his weight.  Overall, he is pleased with  his progress to date.  ALLERGIES:  is allergic to codeine.  Meds: Current Outpatient Medications  Medication Sig Dispense Refill   venlafaxine (EFFEXOR) 25 MG tablet Take 25 mg by mouth 2 (two) times daily.     acetaminophen (TYLENOL) 500 MG tablet Take 500 mg by mouth every 6 (six) hours as needed for moderate pain.     albuterol (PROAIR HFA) 108 (90 BASE) MCG/ACT inhaler Inhale 2 puffs into the lungs every 4 (four) hours as needed. (Patient taking differently: Inhale 2 puffs into the lungs every 4 (four) hours as needed for shortness of breath or wheezing.) 8.5 g 3   atorvastatin (LIPITOR) 40 MG tablet TAKE 1 TABLET(40 MG) BY MOUTH DAILY (Patient taking differently: Take 40 mg by mouth daily. evening) 90 tablet 1   clonazePAM (KLONOPIN) 0.5 MG tablet TAKE 1 TABLET(0.5 MG) BY MOUTH TWICE DAILY AS NEEDED FOR ANXIETY (Patient taking differently: Take 0.5 mg by mouth 2 (two) times daily as needed for anxiety.) 60 tablet 1   fluticasone (FLONASE) 50 MCG/ACT nasal spray USE 2 SPRAYS IN EACH NOSTRIL EVERY DAY AT NIGHT (Patient taking differently: Place 2 sprays into both nostrils at bedtime.) 16 g 6   Melatonin 10 MG TABS Take 10 mg by mouth at bedtime as needed (sleep).  metoprolol succinate (TOPROL-XL) 25 MG 24 hr tablet Take 1 tablet by mouth daily 90 tablet 1   Multiple Vitamins-Minerals (PRESERVISION AREDS 2) CAPS Take 1 capsule by mouth 2 (two) times daily.     OVER THE COUNTER MEDICATION Take 1 capsule by mouth at bedtime as needed (sleep). Relaxium sleep otc supplement     pantoprazole (PROTONIX) 40 MG tablet TAKE 1 TABLET(40 MG) BY MOUTH TWICE DAILY (Patient taking differently: Take 40 mg by mouth 2 (two) times daily.) 180 tablet 3   rivaroxaban (XARELTO) 20 MG TABS tablet Take 1 tablet by mouth daily with supper (Patient taking differently: Take 20 mg by mouth daily with supper.) 90 tablet 1   tamsulosin (FLOMAX) 0.4 MG CAPS capsule Take 1 capsule (0.4 mg total) by mouth daily after  supper. 90 capsule 3   traMADol (ULTRAM) 50 MG tablet Take 50 mg by mouth at bedtime as needed for moderate pain.     No current facility-administered medications for this encounter.    Physical Findings:  vitals were not taken for this visit.  Pain Assessment Pain Score: 0-No pain/10 Unable to assess due to telephone follow-up visit format.  Lab Findings: Lab Results  Component Value Date   WBC 7.5 10/08/2020   HGB 13.9 10/08/2020   HCT 40.7 10/08/2020   MCV 91.3 10/08/2020   PLT 227 10/08/2020     Radiographic Findings: No results found.  Impression/Plan: 1. 80 y.o. gentleman with stage T1c adenocarcinoma of the prostate with a Gleason's score of 4+4 and a PSA of 8.76. He will continue to follow up with urology for ongoing PSA determinations and has an appointment scheduled with Dr. Alinda Money in November 2022, when he will be due for his next ADT injection. He understands what to expect with regards to PSA monitoring going forward. I will look forward to following his response to treatment via correspondence with urology, and would be happy to continue to participate in his care if clinically indicated. I talked to the patient about what to expect in the future, including his risk for erectile dysfunction and rectal bleeding. I encouraged him to call or return to the office if he has any questions regarding his previous radiation or possible radiation side effects. He was comfortable with this plan and will follow up as needed.        Nicholos Johns, PA-C

## 2021-02-22 DIAGNOSIS — H353211 Exudative age-related macular degeneration, right eye, with active choroidal neovascularization: Secondary | ICD-10-CM | POA: Diagnosis not present

## 2021-02-23 ENCOUNTER — Other Ambulatory Visit: Payer: Self-pay

## 2021-02-23 MED ORDER — RIVAROXABAN 20 MG PO TABS: ORAL_TABLET | ORAL | 1 refills | Status: DC

## 2021-02-23 NOTE — Telephone Encounter (Signed)
Pt last saw Dr Irish Lack 08/18/20, last labs 10/08/20 Creat 0.91, age 80, weight 92.1kg, CrCl 82.93, based on CrCl pt is on appropriate dosage of Xarelto 20mg  QD.  Will refill rx.

## 2021-02-24 ENCOUNTER — Ambulatory Visit (INDEPENDENT_AMBULATORY_CARE_PROVIDER_SITE_OTHER): Payer: PPO | Admitting: Otolaryngology

## 2021-02-24 ENCOUNTER — Telehealth: Payer: Self-pay | Admitting: Pharmacist

## 2021-02-24 NOTE — Progress Notes (Signed)
    Chronic Care Management Pharmacy Assistant   Name: Tyler Allison  MRN: [id number] DOB: June 21, 1940  Reason for Encounter: General Disease State Call   Conditions to be addressed/monitored: [employer], HTN, GERD, HLD  Recent office visits:  02/11/21 Chriss Driver, LPN. For medicare wellness. No medication changes.   Recent consult visits:  02/10/21 Deneise Lever, MD. For follow-up. No medication changes.  Hospital visits:  None since 12/23/20  Medications: Outpatient Encounter Medications as of 02/24/2021  Medication Sig Note   acetaminophen (TYLENOL) 500 MG tablet Take 500 mg by mouth every 6 (six) hours as needed for moderate pain.    albuterol (PROAIR HFA) 108 (90 BASE) MCG/ACT inhaler Inhale 2 puffs into the lungs every 4 (four) hours as needed. (Patient taking differently: Inhale 2 puffs into the lungs every 4 (four) hours as needed for shortness of breath or wheezing.)    atorvastatin (LIPITOR) 40 MG tablet TAKE 1 TABLET(40 MG) BY MOUTH DAILY (Patient taking differently: Take 40 mg by mouth daily. evening)    clonazePAM (KLONOPIN) 0.5 MG tablet TAKE 1 TABLET(0.5 MG) BY MOUTH TWICE DAILY AS NEEDED FOR ANXIETY (Patient taking differently: Take 0.5 mg by mouth 2 (two) times daily as needed for anxiety.)    fluticasone (FLONASE) 50 MCG/ACT nasal spray USE 2 SPRAYS IN EACH NOSTRIL EVERY DAY AT NIGHT (Patient taking differently: Place 2 sprays into both nostrils at bedtime.)    Melatonin 10 MG TABS Take 10 mg by mouth at bedtime as needed (sleep).    metoprolol succinate (TOPROL-XL) 25 MG 24 hr tablet Take 1 tablet by mouth daily    Multiple Vitamins-Minerals (PRESERVISION AREDS 2) CAPS Take 1 capsule by mouth 2 (two) times daily.    OVER THE COUNTER MEDICATION Take 1 capsule by mouth at bedtime as needed (sleep). Relaxium sleep otc supplement    pantoprazole (PROTONIX) 40 MG tablet TAKE 1 TABLET(40 MG) BY MOUTH TWICE DAILY (Patient taking differently: Take 40 mg by mouth 2 (two)  times daily.)    rivaroxaban (XARELTO) 20 MG TABS tablet Take 1 tablet by mouth daily with supper    tamsulosin (FLOMAX) 0.4 MG CAPS capsule Take 1 capsule (0.4 mg total) by mouth daily after supper.    traMADol (ULTRAM) 50 MG tablet Take 50 mg by mouth at bedtime as needed for moderate pain. 10/23/2020: Pt ran out, need to get new refill   venlafaxine (EFFEXOR) 25 MG tablet Take 25 mg by mouth 2 (two) times daily.    No facility-administered encounter medications on file as of 02/24/2021.   GEN CALL: Patient stated he is overall feeling fine since his last treatment. He stated he has not noticed any changes. He stated he is active and is going on a vacation next month. He stated he is eating well and does not have any questions about his medications at this time.   Care Gaps:None.  Star Rating Drugs:Atorvastatin 40 mg 12/13/20 90 DS.  Follow-Up:Pharmacist Review  [name], [place] Pharmacist Assistant [phone]

## 2021-02-25 ENCOUNTER — Other Ambulatory Visit: Payer: Self-pay

## 2021-02-25 ENCOUNTER — Ambulatory Visit (INDEPENDENT_AMBULATORY_CARE_PROVIDER_SITE_OTHER): Payer: PPO | Admitting: Family Medicine

## 2021-02-25 VITALS — HR 70 | Temp 99.0°F | Resp 16

## 2021-02-25 DIAGNOSIS — J069 Acute upper respiratory infection, unspecified: Secondary | ICD-10-CM

## 2021-02-25 LAB — INFLUENZA A AND B AG, IMMUNOASSAY
INFLUENZA A ANTIGEN: NOT DETECTED
INFLUENZA B ANTIGEN: NOT DETECTED

## 2021-02-25 MED ORDER — MOLNUPIRAVIR EUA 200MG CAPSULE: 4.0000 | ORAL_CAPSULE | Freq: Two times a day (BID) | ORAL | 0 refills | Status: AC

## 2021-02-25 NOTE — Addendum Note (Signed)
Addended by: Sheral Flow on: 02/25/2021 10:18 AM   Modules accepted: Orders

## 2021-02-25 NOTE — Progress Notes (Addendum)
Subjective:    Patient ID: Tyler Allison, male    DOB: 1940/11/25, 80 y.o.   MRN: [id number]  HPI  Patient is being seen today as a telephone visit.  Phone call began at 906.  Phone call concluded at 915.  Patient consents to be seen as a telephone visit.  He is currently at home.  I am currently in my office.  The patient states for the last 2 or 3 days he has had runny nose and chills.  This morning he woke up and had a fever to 100.3.  He also reports chest congestion.  He states it feels like it is burning in his chest when he takes a deep breath then.  He denies any myalgias or arthralgias.  He denies any chest pain.  He denies any shortness of breath.  Cough is productive of white sputum.  He is having a cough.  He also has a headache.  The headache is located in both temples and in the occiput.  He denies any nausea vomiting or diarrhea or sore throat Past Medical History:  Diagnosis Date   Allergy    Anxiety    Asthma    Cancer ([place])    prostate cancer   Cervical spondylosis with radiculopathy    left c6 radiculopathy   Chronic back pain    GERD (gastroesophageal reflux disease)    Hyperlipidemia    Hypertension    PAF (paroxysmal atrial fibrillation) (HCC)    Prostate cancer (HCC)    Past Surgical History:  Procedure Laterality Date   BACK SURGERY     MICROLARYNGOSCOPY W/VOCAL CORD INJECTION N/A 10/30/2020   Procedure: MICRODIRECT LARYNGOSCOPY WITH VOCAL CORD INJECTION AND CO2 LASER APPLICATION;  Surgeon: Melida Quitter, MD;  Location: [facility];  Service: ENT;  Laterality: N/A;   PROSTATE BIOPSY     SPINE SURGERY     back surgery x 3 including lumbar fusion, neck surgery x 2 including C4-6 ACDF   tympanoplasty with mastoidectomy Left 01/13/2004   Current Outpatient Medications on File Prior to Visit  Medication Sig Dispense Refill   acetaminophen (TYLENOL) 500 MG tablet Take 500 mg by mouth every 6 (six) hours as needed for moderate pain.     albuterol (PROAIR HFA) 108 (90  BASE) MCG/ACT inhaler Inhale 2 puffs into the lungs every 4 (four) hours as needed. (Patient taking differently: Inhale 2 puffs into the lungs every 4 (four) hours as needed for shortness of breath or wheezing.) 8.5 g 3   atorvastatin (LIPITOR) 40 MG tablet TAKE 1 TABLET(40 MG) BY MOUTH DAILY (Patient taking differently: Take 40 mg by mouth daily. evening) 90 tablet 1   clonazePAM (KLONOPIN) 0.5 MG tablet TAKE 1 TABLET(0.5 MG) BY MOUTH TWICE DAILY AS NEEDED FOR ANXIETY (Patient taking differently: Take 0.5 mg by mouth 2 (two) times daily as needed for anxiety.) 60 tablet 1   fluticasone (FLONASE) 50 MCG/ACT nasal spray USE 2 SPRAYS IN EACH NOSTRIL EVERY DAY AT NIGHT (Patient taking differently: Place 2 sprays into both nostrils at bedtime.) 16 g 6   Melatonin 10 MG TABS Take 10 mg by mouth at bedtime as needed (sleep).     metoprolol succinate (TOPROL-XL) 25 MG 24 hr tablet Take 1 tablet by mouth daily 90 tablet 1   Multiple Vitamins-Minerals (PRESERVISION AREDS 2) CAPS Take 1 capsule by mouth 2 (two) times daily.     OVER THE COUNTER MEDICATION Take 1 capsule by mouth at bedtime as needed (sleep).  Relaxium sleep otc supplement     pantoprazole (PROTONIX) 40 MG tablet TAKE 1 TABLET(40 MG) BY MOUTH TWICE DAILY (Patient taking differently: Take 40 mg by mouth 2 (two) times daily.) 180 tablet 3   rivaroxaban (XARELTO) 20 MG TABS tablet Take 1 tablet by mouth daily with supper 90 tablet 1   tamsulosin (FLOMAX) 0.4 MG CAPS capsule Take 1 capsule (0.4 mg total) by mouth daily after supper. 90 capsule 3   traMADol (ULTRAM) 50 MG tablet Take 50 mg by mouth at bedtime as needed for moderate pain.     venlafaxine (EFFEXOR) 25 MG tablet Take 25 mg by mouth 2 (two) times daily.     No current facility-administered medications on file prior to visit.   Marland Kitchenall Social History   Socioeconomic History   Marital status: Married    Spouse name: Not on file   Number of children: 2   Years of education: Not on file    Highest education level: Not on file  Occupational History    Comment: retired  Tobacco Use   Smoking status: Former    Packs/day: 0.50    Years: 13.00    Pack years: 6.50    Types: Cigarettes    Start date: 33    Quit date: 10/23/1968    Years since quitting: 52.3   Smokeless tobacco: Never  Vaping Use   Vaping Use: Never used  Substance and Sexual Activity   Alcohol use: No   Drug use: No   Sexual activity: Not Currently    Comment: married, retired.  Other Topics Concern   Not on file  Social History Narrative   2 children but 1 deceased   Social Determinants of Health   Financial Resource Strain: Low Risk    Difficulty of Paying Living Expenses: Not hard at all  Food Insecurity: No Food Insecurity   Worried About Charity fundraiser in the Last Year: Never true   Arboriculturist in the Last Year: Never true  Transportation Needs: No Transportation Needs   Lack of Transportation (Medical): No   Lack of Transportation (Non-Medical): No  Physical Activity: Insufficiently Active   Days of Exercise per Week: 5 days   Minutes of Exercise per Session: 20 min  Stress: No Stress Concern Present   Feeling of Stress : Not at all  Social Connections: Socially Integrated   Frequency of Communication with Friends and Family: More than three times a week   Frequency of Social Gatherings with Friends and Family: More than three times a week   Attends Religious Services: More than 4 times per year   Active Member of [employer] or Organizations: Yes   Attends Music therapist: More than 4 times per year   Marital Status: Married  Human resources officer Violence: Not At Risk   Fear of Current or Ex-Partner: No   Emotionally Abused: No   Physically Abused: No   Sexually Abused: No     Review of Systems  All other systems reviewed and are negative.     Objective:   Physical Exam        Assessment & Plan:   Symptoms sound consistent with a viral upper  respiratory infection.  However given his age and medical comorbidities, I have recommended that he come by for rapid flu test and a COVID test.  If the flu and COVID test are negative, I will treat the patient is a viral upper respiratory infection.  If symptoms  persisted for more than a week, I will treat the patient for possible sinusitis however right now I feel that this is most likely a viral upper respiratory infection.  Patient came to our parking lot.  Flu test was negative.  COVID test was positive.  Oxygen saturations were 93% on room air.  We will start the patient on molnupiravir 800 mg pobid for 5 days.  If the patient develops chest pain shortness of breath or hypoxia he is directed to go to the emergency room.

## 2021-02-25 NOTE — Addendum Note (Signed)
Addended by: Sheral Flow on: 02/25/2021 09:50 AM   Modules accepted: Orders

## 2021-03-01 ENCOUNTER — Telehealth: Payer: Self-pay | Admitting: *Deleted

## 2021-03-05 ENCOUNTER — Telehealth: Payer: Self-pay | Admitting: *Deleted

## 2021-03-09 ENCOUNTER — Telehealth: Payer: Self-pay | Admitting: *Deleted

## 2021-03-09 DIAGNOSIS — H53002 Unspecified amblyopia, left eye: Secondary | ICD-10-CM | POA: Diagnosis not present

## 2021-03-09 DIAGNOSIS — H52203 Unspecified astigmatism, bilateral: Secondary | ICD-10-CM | POA: Diagnosis not present

## 2021-03-09 DIAGNOSIS — H43813 Vitreous degeneration, bilateral: Secondary | ICD-10-CM | POA: Diagnosis not present

## 2021-03-09 DIAGNOSIS — H0100A Unspecified blepharitis right eye, upper and lower eyelids: Secondary | ICD-10-CM | POA: Diagnosis not present

## 2021-03-10 ENCOUNTER — Encounter: Payer: Self-pay | Admitting: Family Medicine

## 2021-03-10 DIAGNOSIS — C44319 Basal cell carcinoma of skin of other parts of face: Secondary | ICD-10-CM | POA: Diagnosis not present

## 2021-03-10 DIAGNOSIS — L570 Actinic keratosis: Secondary | ICD-10-CM | POA: Diagnosis not present

## 2021-03-10 DIAGNOSIS — D1801 Hemangioma of skin and subcutaneous tissue: Secondary | ICD-10-CM | POA: Diagnosis not present

## 2021-03-10 DIAGNOSIS — Z85828 Personal history of other malignant neoplasm of skin: Secondary | ICD-10-CM | POA: Diagnosis not present

## 2021-03-10 DIAGNOSIS — L814 Other melanin hyperpigmentation: Secondary | ICD-10-CM | POA: Diagnosis not present

## 2021-03-10 DIAGNOSIS — L821 Other seborrheic keratosis: Secondary | ICD-10-CM | POA: Diagnosis not present

## 2021-03-10 DIAGNOSIS — D225 Melanocytic nevi of trunk: Secondary | ICD-10-CM | POA: Diagnosis not present

## 2021-03-12 ENCOUNTER — Telehealth: Payer: Self-pay | Admitting: *Deleted

## 2021-03-18 ENCOUNTER — Other Ambulatory Visit: Payer: PPO

## 2021-03-18 ENCOUNTER — Other Ambulatory Visit: Payer: Self-pay

## 2021-03-18 ENCOUNTER — Other Ambulatory Visit: Payer: Self-pay | Admitting: Family Medicine

## 2021-03-18 DIAGNOSIS — Z136 Encounter for screening for cardiovascular disorders: Secondary | ICD-10-CM | POA: Diagnosis not present

## 2021-03-18 DIAGNOSIS — Z13220 Encounter for screening for lipoid disorders: Secondary | ICD-10-CM

## 2021-03-18 DIAGNOSIS — E782 Mixed hyperlipidemia: Secondary | ICD-10-CM

## 2021-03-18 DIAGNOSIS — I10 Essential (primary) hypertension: Secondary | ICD-10-CM | POA: Diagnosis not present

## 2021-03-18 NOTE — Progress Notes (Signed)
Chronic Care Management Pharmacy Note  03/26/2021 Name:  Tyler Allison MRN:  [id number] DOB:  Oct 07, 1940  Subjective: Tyler Allison is an 80 y.o. year old male who is a primary patient of Pickard, Cammie Mcgee, MD.  The CCM team was consulted for assistance with disease management and care coordination needs.    Engaged with patient by telephone for follow up visit in response to provider referral for pharmacy case management and/or care coordination services.   Consent to Services:  The patient was given the following information about Chronic Care Management services today, agreed to services, and gave verbal consent: 1. CCM service includes personalized support from designated clinical staff supervised by the primary care provider, including individualized plan of care and coordination with other care providers 2. 24/7 contact phone numbers for assistance for urgent and routine care needs. 3. Service will only be billed when office clinical staff spend 20 minutes or more in a month to coordinate care. 4. Only one practitioner may furnish and bill the service in a calendar month. 5.The patient may stop CCM services at any time (effective at the end of the month) by phone call to the office staff. 6. The patient will be responsible for cost sharing (co-pay) of up to 20% of the service fee (after annual deductible is met). Patient agreed to services and consent obtained.  Patient Care Team: [name], MD as PCP - General (Family Medicine) Jettie Booze, MD as PCP - Cardiology (Cardiology) Edythe Clarity, [facility] as Pharmacist (Pharmacist) Cira Rue, RN as Oncology Nurse Navigator Tyler Pita, MD as Consulting Physician (Radiation Oncology) Delice Bison Charlestine Massed, NP as Nurse Practitioner (Hematology and Oncology) Harmon Pier, RN as Registered Nurse  Recent office visits:  02/25/21 Dennard Schaumann) - COVID +, given antiviral 0/6/22 Chriss Driver, LPN. For medicare  wellness. No medication changes.    Recent consult visits:  02/10/21 Deneise Lever, MD. For follow-up. No medication changes.   Hospital visits:  None since 12/23/20  Objective:  Lab Results  Component Value Date   CREATININE 0.84 03/18/2021   BUN 18 03/18/2021   GFR 74.56 07/03/2019   GFRNONAA >60 10/08/2020   GFRAA 85 03/23/2020   NA 139 03/18/2021   K 4.3 03/18/2021   CALCIUM 8.9 03/18/2021   CO2 31 03/18/2021   GLUCOSE 102 (H) 03/18/2021    Lab Results  Component Value Date/Time   GFR 74.56 07/03/2019 12:06 PM    Last diabetic Eye exam: No results found for: HMDIABEYEEXA  Last diabetic Foot exam: No results found for: HMDIABFOOTEX   Lab Results  Component Value Date   CHOL 169 03/18/2021   HDL 60 03/18/2021   LDLCALC 90 03/18/2021   TRIG 98 03/18/2021   CHOLHDL 2.8 03/18/2021    Hepatic Function Latest Ref Rng & Units 03/18/2021 03/23/2020 12/03/2019  Total Protein 6.1 - 8.1 g/dL 6.1 6.8 6.7  Albumin 3.7 - 4.7 g/dL - - -  AST 10 - 35 U/L _0 ALT 9 - 46 U/L _1 Alk Phosphatase 39 - 117 IU/L - - -  Total Bilirubin 0.2 - 1.2 mg/dL 0.6 0.6 1.0    Lab Results  Component Value Date/Time   TSH 2.18 02/11/2016 12:30 PM   TSH 5.442 (H) 03/13/2015 08:16 AM    CBC Latest Ref Rng & Units 03/18/2021 10/08/2020 03/23/2020  WBC 3.8 - 10.8 Thousand/uL 4.3 7.5 7.2  Hemoglobin 13.2 - 17.1 g/dL 13.0(L)  13.9 14.6  Hematocrit 38.5 - 50.0 % 39.6 40.7 42.8  Platelets 140 - 400 Thousand/uL 192 227 231    No results found for: VD25OH  Clinical ASCVD: Yes  The ASCVD Risk score (Arnett DK, et al., 2019) failed to calculate for the following reasons:   The 2019 ASCVD risk score is only valid for ages 65 to 57    Depression screen PHQ 2/9 03/23/2021 02/11/2021 02/11/2021  Decreased Interest 0 0 0  Down, Depressed, Hopeless 0 0 0  PHQ - 2 Score 0 0 0     Social History   Tobacco Use  Smoking Status Former   Packs/day: 0.50   Years: 13.00   Pack years:  6.50   Types: Cigarettes   Start date: 79   Quit date: 10/23/1968   Years since quitting: 52.4  Smokeless Tobacco Never   BP Readings from Last 3 Encounters:  02/10/21 122/74  12/11/20 110/72  10/30/20 132/76   Pulse Readings from Last 3 Encounters:  02/25/21 70  02/10/21 64  12/11/20 85   Wt Readings from Last 3 Encounters:  02/11/21 203 lb (92.1 kg)  02/10/21 203 lb 9.6 oz (92.4 kg)  12/11/20 203 lb 6.4 oz (92.3 kg)   BMI Readings from Last 3 Encounters:  02/11/21 28.31 kg/m  02/10/21 28.80 kg/m  12/11/20 28.77 kg/m    Assessment/Interventions: Review of patient past medical history, allergies, medications, health status, including review of consultants reports, laboratory and other test data, was performed as part of comprehensive evaluation and provision of chronic care management services.   SDOH:  (Social Determinants of Health) assessments and interventions performed: Yes   Financial Resource Strain: Low Risk    Difficulty of Paying Living Expenses: Not hard at all    SDOH Screenings   Alcohol Screen: Low Risk    Last Alcohol Screening Score (AUDIT): 0  Depression (PHQ2-9): Low Risk    PHQ-2 Score: 0  Financial Resource Strain: Low Risk    Difficulty of Paying Living Expenses: Not hard at all  Food Insecurity: No Food Insecurity   Worried About Charity fundraiser in the Last Year: Never true   Ran Out of Food in the Last Year: Never true  Housing: Low Risk    Last Housing Risk Score: 0  Physical Activity: Insufficiently Active   Days of Exercise per Week: 3 days   Minutes of Exercise per Session: 20 min  Social Connections: Engineer, building services of Communication with Friends and Family: More than three times a week   Frequency of Social Gatherings with Friends and Family: More than three times a week   Attends Religious Services: More than 4 times per year   Active Member of [employer] or Organizations: Yes   Attends Arts administrator: More than 4 times per year   Marital Status: Married  Stress: No Stress Concern Present   Feeling of Stress : Not at all  Tobacco Use: Medium Risk   Smoking Tobacco Use: Former   Smokeless Tobacco Use: Never   Passive Exposure: Not on Pensions consultant Needs: No Transportation Needs   Lack of Transportation (Medical): No   Lack of Transportation (Non-Medical): No    CCM Care Plan  Allergies  Allergen Reactions   Codeine Nausea And Vomiting    Medications Reviewed Today     Reviewed by Edythe Clarity, [facility] (Pharmacist) on 03/26/21 at 1413  Med List Status: <None>   Medication Order Taking? Sig  Documenting Provider Last Dose Status Informant  acetaminophen (TYLENOL) 500 MG tablet [id number] Yes Take 500 mg by mouth every 6 (six) hours as needed for moderate pain. [provider] Taking Active Self  albuterol (PROAIR HFA) 108 (90 BASE) MCG/ACT inhaler [id number] Yes Inhale 2 puffs into the lungs every 4 (four) hours as needed. [name], MD Taking Active   atorvastatin (LIPITOR) 40 MG tablet [id number] Yes TAKE 1 TABLET(40 MG) BY MOUTH DAILY [name], MD Taking Active   clonazePAM (KLONOPIN) 0.5 MG tablet [id number] Yes TAKE 1 TABLET(0.5 MG) BY MOUTH TWICE DAILY AS NEEDED FOR ANXIETY  Patient taking differently: Take 0.5 mg by mouth 2 (two) times daily as needed for anxiety.   [name], MD Taking Active   fluticasone [facility]) 50 MCG/ACT nasal spray [id number]  USE 2 SPRAYS IN EACH NOSTRIL EVERY DAY AT NIGHT  Patient taking differently: Place 2 sprays into both nostrils at bedtime.   Rozetta Nunnery, MD  Expired 10/23/20 2359 Self  Melatonin 10 MG TABS [id number] Yes Take 10 mg by mouth at bedtime as needed (sleep). [provider] Taking Active Self  metoprolol succinate (TOPROL-XL) 25 MG 24 hr tablet [id number] Yes Take 1 tablet by mouth daily [name], MD Taking Active   Multiple Vitamins-Minerals (PRESERVISION  AREDS 2) CAPS [id number] Yes Take 1 capsule by mouth 2 (two) times daily. [provider] Taking Active Self  pantoprazole (PROTONIX) 40 MG tablet [id number] Yes TAKE 1 TABLET(40 MG) BY MOUTH TWICE DAILY Pickard, Cammie Mcgee, MD Taking Active   rivaroxaban (XARELTO) 20 MG TABS tablet [id number] Yes Take 1 tablet by mouth daily with supper Jettie Booze, MD Taking Active   tamsulosin [facility]) 0.4 MG CAPS capsule [id number] Yes Take 1 capsule (0.4 mg total) by mouth daily after supper. Tyler Pita, MD Taking Active   traMADol Veatrice [facility]bon) 50 MG tablet [id number] Yes Take 50 mg by mouth at bedtime as needed for moderate pain. [provider] Taking Active Self           Med Note Caryn Section, [place] A   Fri Oct 23, 2020  3:59 PM) Pt ran out, need to get new refill  venlafaxine (EFFEXOR) 25 MG tablet [id number] Yes Take 25 mg by mouth 2 (two) times daily. [provider] Taking Active             Patient Active Problem List   Diagnosis Date Noted   Malignant neoplasm of prostate ([place]) 11/03/2020   Anticoagulant long-term use 10/13/2020   Dysphonia 10/13/2020   Aortic atherosclerosis ([place]) 09/11/2020   Hypertension 09/11/2020   Body mass index (BMI) 29.0-29.9, adult 07/14/2020   Elevated blood-pressure reading, without diagnosis of hypertension 07/14/2020   Neck pain 07/14/2020   Pain in thoracic spine 07/14/2020   Close exposure to COVID-19 virus 06/08/2020   Central sleep apnea 02/07/2020   Nocturnal hypoxemia 03/01/2019   GERD (gastroesophageal reflux disease) 10/24/2018   Paroxysmal atrial fibrillation ([place]) 07/01/2016   Hyperlipidemia 07/01/2016   Pain in left arm 05/10/2016   Cervical spondylosis with radiculopathy    Asthmatic bronchitis, moderate persistent, uncomplicated 69/45/0388   Chronic back pain    ADVERSE DRUG REACTION 08/24/2009   Asthma 08/19/2009   PULMONARY NODULE 08/19/2009   COUGH 08/19/2009   PURE HYPERCHOLESTEROLEMIA 04/24/2009     Immunization History  Administered Date(s) Administered   Fluad Quad(high Dose 65+) 12/28/2018, 01/30/2020   Influenza Whole 02/09/2010   Influenza, High Dose Seasonal PF 02/15/2017, 02/01/2018  Influenza,inj,Quad PF,6+ Mos 02/13/2013, 02/25/2014, 03/13/2015, 02/11/2016, 01/19/2021   Influenza,inj,quad, With Preservative 02/06/2018   Influenza-Unspecified 03/19/2021   PFIZER(Purple Top)SARS-COV-2 Vaccination 06/20/2019, 07/13/2019, 05/11/2020   Pneumococcal Conjugate-13 03/14/2014   Pneumococcal Polysaccharide-23 05/09/2004, 12/05/2017   Td 01/07/2005   Zoster, Live 08/08/2006    Conditions to be addressed/monitored:  Afib, HTN, Asthma, GERD, HLD, Back Pain, Aortic Atherosclerosis  Care Plan : General Pharmacy (Adult)  Updates made by Edythe Clarity, RPH since 03/26/2021 12:00 AM     Problem: Afib, HTN, Asthma, GERD, HLD, Back Pain, Aortic Atherosclerosis   Priority: High  Onset Date: 09/17/2020     Long-Range Goal: Patient-Specific Goal   Start Date: 09/17/2020  Expected End Date: 03/20/2021  Recent Progress: On track  Priority: High  Note:   Current Barriers:  Unable to independently afford treatment regimen  Pharmacist Clinical Goal(s):  Patient will verbalize ability to afford treatment regimen maintain control of blood pressure and cholesterol as evidenced by monitoring and labs  adhere to prescribed medication regimen as evidenced by fill dates contact provider office for questions/concerns as evidenced notation of same in electronic health record through collaboration with PharmD and provider.   Interventions: 1:1 collaboration with [name], MD regarding development and update of comprehensive plan of care as evidenced by provider attestation and co-signature Inter-disciplinary care team collaboration (see longitudinal plan of care) Comprehensive medication review performed; medication list updated in electronic medical record  Hypertension (BP  goal <140/90) -Controlled -Current treatment: Metoprolol XL 70m daily -Medications previously tried: none noted  -Current home readings: not checking, controlled in office -Current exercise habits: stationary bike 2-3 minutes daily for a few times per day -Denies hypotensive/hypertensive symptoms -Educated on BP goals and benefits of medications for prevention of heart attack, stroke and kidney damage; Exercise goal of 150 minutes per week; Importance of home blood pressure monitoring; Symptoms of hypotension and importance of maintaining adequate hydration; -Counseled to monitor BP at home as able or if symptomatic, document, and provide log at future appointments -Recommended to continue current medication  Update 03/26/21 BP has been controlled Continues exercise as previous. Denies any symptoms of dizziness or HA Continue current medications - monitor BP at home  Hyperlipidemia/Aortic Atherosclerosis: (LDL goal < 70) -Controlled -Current treatment: Atorvastatin 448mdaily -Medications previously tried: none noted  -Educated on Cholesterol goals;  Benefits of statin for ASCVD risk reduction; Importance of limiting foods high in cholesterol;  -Reviewed most recent lipid panel with patient, congratulated him on great results -Recommended to continue current medication  Atrial Fibrillation (Goal: prevent stroke and major bleeding) -Controlled -CHADSVASC: 4 (age x2, HTN, CAD). Renal function is normal.  -Current treatment: Rate control: Metoprolol XL 2548maily Anticoagulation: Xarelto 29m53mily -Medications previously tried: none ntoed -Home BP and HR readings: controlled in office -Denies any recent chest pains or irregular heart beat  -Counseled on increased risk of stroke due to Afib and benefits of anticoagulation for stroke prevention; importance of adherence to anticoagulant exactly as prescribed; bleeding risk associated with Xarelto and importance of  self-monitoring for signs/symptoms of bleeding; avoidance of NSAIDs due to increased bleeding risk with anticoagulants; -Recommended to continue current medication Assessed patient finances. He reports Xarelto copay is sometimes an issue.  Income has become much lower this year than in the past.  Will mail JJPAF patient assistance program for patient to complete.  Update 03/26/21 He is now in the donut hole - Xarelto copay is very expensive.  He only needs about 30 days  to get him by until the year starts over.  Will request new rx for 30 days to be sent to [facility] so that he can avoid higher copay.  Will try and provide approx 7 days worth of samples to get him to the first of the year.  Patient also elects to use [facility] for packaging and synchronization for refills moving forward.  Will coordinate new refills sent to [facility]. No changes to meds at this time.  Asthma (Goal: control symptoms and prevent exacerbations) -Controlled -Current treatment  Albuterol HFA 57mg prn -Medications previously tried: Anoro -Exacerbations requiring treatment in last 6 months: none -Patient denies consistent use of maintenance inhaler - does not have one, or need it -Frequency of rescue inhaler use: as needed - very rarely -Counseled on Proper inhaler technique; When to use rescue inhaler -Recommended to continue current medication  Chronic Back Pain (Goal: Control pain) -Controlled -Current treatment  Tramadol 519m -Medications previously tried: Advil - no longer taking d/t Xarelto  -Counseled on avoidance of NSAIDs to control pain with Xarelto therapy -Recommended to continue current medication  GERD (Goal: Minimize symptoms) -Controlled -Current treatment  Pantoprazole 4026mID -Medications previously tried: none noted -Takes appropriately - denies any recent symptoms -Working on trigger foods -Recommended to continue current medication   Patient Goals/Self-Care Activities Patient  will:  - take medications as prescribed focus on medication adherence by pill box collaborate with provider on medication access solutions target a minimum of 150 minutes of moderate intensity exercise weekly  Follow Up Plan: The care management team will reach out to the patient again over the next 180 days.             Medication Assistance: Application for Xarelto  medication assistance program. in process.  Anticipated assistance start date unknown.  See plan of care for additional detail.  Patient's preferred [facility]:  [facility]one: [phone]: [phone] [place] [place]: [phone]: [phone] pill box? Yes Pt endorses 100% compliance  We discussed: Benefits of medication synchronization, packaging and delivery as well as enhanced pharmacist oversight with [facility]. Patient decided to: Continue current medication management strategy  Verbal consent obtained for [facility] enhanced pharmacy services (medication synchronization, adherence packaging, delivery coordination). A medication sync plan was created to allow patient to get all medications delivered once every 30 to 90 days per patient preference. Patient understands they have freedom to choose pharmacy and clinical pharmacist will coordinate care between all prescribers and [facility].  Care Plan and Follow Up Patient Decision:  Patient agrees to Care Plan and Follow-up.  Plan: The care management team will reach out to the patient again over the next 180 days.  ChrBeverly MilchharmD Clinical Pharmacist [phone]

## 2021-03-19 DIAGNOSIS — C61 Malignant neoplasm of prostate: Secondary | ICD-10-CM | POA: Diagnosis not present

## 2021-03-19 LAB — CBC WITH DIFFERENTIAL/PLATELET
Absolute Monocytes: 808 cells/uL (ref 200–950)
Basophils Absolute: 39 cells/uL (ref 0–200)
Basophils Relative: 0.9 %
Eosinophils Absolute: 108 cells/uL (ref 15–500)
Eosinophils Relative: 2.5 %
HCT: 39.6 % (ref 38.5–50.0)
Hemoglobin: 13 g/dL — ABNORMAL LOW (ref 13.2–17.1)
Lymphs Abs: 675 cells/uL — ABNORMAL LOW (ref 850–3900)
MCH: 31.1 pg (ref 27.0–33.0)
MCHC: 32.8 g/dL (ref 32.0–36.0)
MCV: 94.7 fL (ref 80.0–100.0)
MPV: 9.9 fL (ref 7.5–12.5)
Monocytes Relative: 18.8 %
Neutro Abs: 2670 cells/uL (ref 1500–7800)
Neutrophils Relative %: 62.1 %
Platelets: 192 10*3/uL (ref 140–400)
RBC: 4.18 10*6/uL — ABNORMAL LOW (ref 4.20–5.80)
RDW: 11.9 % (ref 11.0–15.0)
Total Lymphocyte: 15.7 %
WBC: 4.3 10*3/uL (ref 3.8–10.8)

## 2021-03-19 LAB — COMPLETE METABOLIC PANEL WITH GFR
AG Ratio: 1.4 (calc) (ref 1.0–2.5)
ALT: 17 U/L (ref 9–46)
AST: 21 U/L (ref 10–35)
Albumin: 3.6 g/dL (ref 3.6–5.1)
Alkaline phosphatase (APISO): 56 U/L (ref 35–144)
BUN: 18 mg/dL (ref 7–25)
CO2: 31 mmol/L (ref 20–32)
Calcium: 8.9 mg/dL (ref 8.6–10.3)
Chloride: 102 mmol/L (ref 98–110)
Creat: 0.84 mg/dL (ref 0.70–1.22)
Globulin: 2.5 g/dL (calc) (ref 1.9–3.7)
Glucose, Bld: 102 mg/dL — ABNORMAL HIGH (ref 65–99)
Potassium: 4.3 mmol/L (ref 3.5–5.3)
Sodium: 139 mmol/L (ref 135–146)
Total Bilirubin: 0.6 mg/dL (ref 0.2–1.2)
Total Protein: 6.1 g/dL (ref 6.1–8.1)
eGFR: 88 mL/min/{1.73_m2} (ref >=60)

## 2021-03-19 LAB — LIPID PANEL
Cholesterol: 169 mg/dL (ref <200)
HDL: 60 mg/dL (ref >=40)
LDL Cholesterol (Calc): 90 mg/dL (calc)
Non-HDL Cholesterol (Calc): 109 mg/dL (calc) (ref <130)
Total CHOL/HDL Ratio: 2.8 (calc) (ref <5.0)
Triglycerides: 98 mg/dL (ref <150)

## 2021-03-20 ENCOUNTER — Other Ambulatory Visit: Payer: Self-pay | Admitting: Family Medicine

## 2021-03-22 DIAGNOSIS — H353221 Exudative age-related macular degeneration, left eye, with active choroidal neovascularization: Secondary | ICD-10-CM | POA: Diagnosis not present

## 2021-03-23 ENCOUNTER — Other Ambulatory Visit: Payer: Self-pay

## 2021-03-23 ENCOUNTER — Encounter: Payer: Self-pay | Admitting: *Deleted

## 2021-03-23 ENCOUNTER — Inpatient Hospital Stay: Payer: PPO | Attending: Urology | Admitting: *Deleted

## 2021-03-23 DIAGNOSIS — C61 Malignant neoplasm of prostate: Secondary | ICD-10-CM

## 2021-03-23 NOTE — Progress Notes (Signed)
  2 Identifiers used for verification purposes for this appointment. SCP reviewed and completed. SDOH assessed and completed. Pt tends to have some SOB with exertion this is not new. He said it's been going on since 2020 after he had a real bad sinus infection which was treated with antibiotics and steroids. He mainly walks around his property 3x a week. He can no longer do the 2 miles 5x a week on the track. Pt's chief complaint is the nocturia. He usually is up to bathroom 5 times a night and finds it hard to get back to sleep some nights. Urine flow is normal and bowel movements are normal. He has an upcoming Eligard injection next week. He does complain of fatigue and hotflashes after these injections. Pt last colonoscopy was 6 years ago. Pt did receive his flu vaccine last week at his doctor's office. No extra booster vaccines recently.

## 2021-03-25 ENCOUNTER — Encounter: Payer: PPO | Admitting: Family Medicine

## 2021-03-26 ENCOUNTER — Telehealth: Payer: Self-pay | Admitting: *Deleted

## 2021-03-26 ENCOUNTER — Other Ambulatory Visit: Payer: Self-pay | Admitting: *Deleted

## 2021-03-26 ENCOUNTER — Ambulatory Visit (INDEPENDENT_AMBULATORY_CARE_PROVIDER_SITE_OTHER): Payer: PPO | Admitting: Pharmacist

## 2021-03-26 ENCOUNTER — Other Ambulatory Visit: Payer: Self-pay | Admitting: Pharmacist

## 2021-03-26 DIAGNOSIS — C61 Malignant neoplasm of prostate: Secondary | ICD-10-CM | POA: Diagnosis not present

## 2021-03-26 DIAGNOSIS — Z5111 Encounter for antineoplastic chemotherapy: Secondary | ICD-10-CM | POA: Diagnosis not present

## 2021-03-26 DIAGNOSIS — N401 Enlarged prostate with lower urinary tract symptoms: Secondary | ICD-10-CM | POA: Diagnosis not present

## 2021-03-26 DIAGNOSIS — I480 Paroxysmal atrial fibrillation: Secondary | ICD-10-CM

## 2021-03-26 DIAGNOSIS — I10 Essential (primary) hypertension: Secondary | ICD-10-CM

## 2021-03-26 DIAGNOSIS — R351 Nocturia: Secondary | ICD-10-CM | POA: Diagnosis not present

## 2021-03-26 MED ORDER — ATORVASTATIN CALCIUM 40 MG PO TABS: ORAL_TABLET | ORAL | 3 refills | Status: DC

## 2021-03-26 MED ORDER — METOPROLOL SUCCINATE ER 25 MG PO TB24: 25.0000 mg | ORAL_TABLET | Freq: Every day | ORAL | 2 refills | Status: DC

## 2021-03-26 MED ORDER — RIVAROXABAN 20 MG PO TABS: ORAL_TABLET | ORAL | 1 refills | Status: DC

## 2021-03-26 MED ORDER — PANTOPRAZOLE SODIUM 40 MG PO TBEC: DELAYED_RELEASE_TABLET | ORAL | 3 refills | Status: DC

## 2021-03-26 NOTE — Patient Instructions (Addendum)
Visit Information   Goals Addressed             This Visit's Progress    Stay Active and Independent-Low Back Pain   On track    Timeframe:  Long-Range Goal Priority:  High Start Date:     09/17/20                        Expected End Date: 03/20/21                      Follow Up Date 12/18/20    - use fitness equipment at home - walk indoors - walk outside    Why is this important?   Regular activity or exercise is important to managing back pain.  Activity helps to keep your muscles strong.  You will sleep better and feel more relaxed.  You will have more energy and feel less stressed.  If you are not active now, start slowly. Little changes make a big difference.  Rest, but not too much.  Stay as active as you can and listen to your body's signals.     Notes:        Patient Care Plan: General Pharmacy (Adult)     Problem Identified: Afib, HTN, Asthma, GERD, HLD, Back Pain, Aortic Atherosclerosis   Priority: High  Onset Date: 09/17/2020     Long-Range Goal: Patient-Specific Goal   Start Date: 09/17/2020  Expected End Date: 03/20/2021  Recent Progress: On track  Priority: High  Note:   Current Barriers:  Unable to independently afford treatment regimen  Pharmacist Clinical Goal(s):  Patient will verbalize ability to afford treatment regimen maintain control of blood pressure and cholesterol as evidenced by monitoring and labs  adhere to prescribed medication regimen as evidenced by fill dates contact provider office for questions/concerns as evidenced notation of same in electronic health record through collaboration with PharmD and provider.   Interventions: 1:1 collaboration with [name], MD regarding development and update of comprehensive plan of care as evidenced by provider attestation and co-signature Inter-disciplinary care team collaboration (see longitudinal plan of care) Comprehensive medication review performed; medication list updated  in electronic medical record  Hypertension (BP goal <140/90) -Controlled -Current treatment: Metoprolol XL 25mg  daily -Medications previously tried: none noted  -Current home readings: not checking, controlled in office -Current exercise habits: stationary bike 2-3 minutes daily for a few times per day -Denies hypotensive/hypertensive symptoms -Educated on BP goals and benefits of medications for prevention of heart attack, stroke and kidney damage; Exercise goal of 150 minutes per week; Importance of home blood pressure monitoring; Symptoms of hypotension and importance of maintaining adequate hydration; -Counseled to monitor BP at home as able or if symptomatic, document, and provide log at future appointments -Recommended to continue current medication  Update 03/26/21 BP has been controlled Continues exercise as previous. Denies any symptoms of dizziness or HA Continue current medications - monitor BP at home  Hyperlipidemia/Aortic Atherosclerosis: (LDL goal < 70) -Controlled -Current treatment: Atorvastatin 40mg  daily -Medications previously tried: none noted  -Educated on Cholesterol goals;  Benefits of statin for ASCVD risk reduction; Importance of limiting foods high in cholesterol;  -Reviewed most recent lipid panel with patient, congratulated him on great results -Recommended to continue current medication  Atrial Fibrillation (Goal: prevent stroke and major bleeding) -Controlled -CHADSVASC: 4 (age x2, HTN, CAD). Renal function is normal.  -Current treatment: Rate control: Metoprolol XL 25mg  daily Anticoagulation: Xarelto  20mg  daily -Medications previously tried: none ntoed -Home BP and HR readings: controlled in office -Denies any recent chest pains or irregular heart beat  -Counseled on increased risk of stroke due to Afib and benefits of anticoagulation for stroke prevention; importance of adherence to anticoagulant exactly as prescribed; bleeding risk  associated with Xarelto and importance of self-monitoring for signs/symptoms of bleeding; avoidance of NSAIDs due to increased bleeding risk with anticoagulants; -Recommended to continue current medication Assessed patient finances. He reports Xarelto copay is sometimes an issue.  Income has become much lower this year than in the past.  Will mail JJPAF patient assistance program for patient to complete.  Update 03/26/21 He is now in the donut hole - Xarelto copay is very expensive.  He only needs about 30 days to get him by until the year starts over.  Will request new rx for 30 days to be sent to [facility] so that he can avoid higher copay.  Will try and provide approx 7 days worth of samples to get him to the first of the year.  Patient also elects to use [facility] for packaging and synchronization for refills moving forward.  Will coordinate new refills sent to [facility]. No changes to meds at this time.  Asthma (Goal: control symptoms and prevent exacerbations) -Controlled -Current treatment  Albuterol HFA 78mcg prn -Medications previously tried: Anoro -Exacerbations requiring treatment in last 6 months: none -Patient denies consistent use of maintenance inhaler - does not have one, or need it -Frequency of rescue inhaler use: as needed - very rarely -Counseled on Proper inhaler technique; When to use rescue inhaler -Recommended to continue current medication  Chronic Back Pain (Goal: Control pain) -Controlled -Current treatment  Tramadol 50mg   -Medications previously tried: Advil - no longer taking d/t Xarelto  -Counseled on avoidance of NSAIDs to control pain with Xarelto therapy -Recommended to continue current medication  GERD (Goal: Minimize symptoms) -Controlled -Current treatment  Pantoprazole 40mg  BID -Medications previously tried: none noted -Takes appropriately - denies any recent symptoms -Working on trigger foods -Recommended to continue current  medication   Patient Goals/Self-Care Activities Patient will:  - take medications as prescribed focus on medication adherence by pill box collaborate with provider on medication access solutions target a minimum of 150 minutes of moderate intensity exercise weekly  Follow Up Plan: The care management team will reach out to the patient again over the next 180 days.            Patient verbalizes understanding of instructions provided today and agrees to view in [place].  Telephone follow up appointment with pharmacy team member scheduled for: 6 months  Edythe Clarity, [place]

## 2021-03-26 NOTE — Telephone Encounter (Signed)
-----   Message from Edythe Clarity, [facility] sent at 03/26/2021  2:21 PM EST ----- Hello!  This patient has elected to use [facility] for synchronization and packs.    Can we get Rx for Pantoprazole, Xarelto, Tamsulosin, Metoprolol, and Atorvastatin sent in for 90 days please??  Thanks! Gerald Stabs

## 2021-03-26 NOTE — Telephone Encounter (Signed)
Protonix and Lipitor filled per protocol.   Other meds will need to go to speciality.

## 2021-03-30 ENCOUNTER — Encounter: Payer: PPO | Admitting: Family Medicine

## 2021-04-05 ENCOUNTER — Telehealth: Payer: Self-pay | Admitting: *Deleted

## 2021-04-06 ENCOUNTER — Other Ambulatory Visit: Payer: Self-pay | Admitting: *Deleted

## 2021-04-06 ENCOUNTER — Telehealth: Payer: Self-pay | Admitting: *Deleted

## 2021-04-06 ENCOUNTER — Other Ambulatory Visit: Payer: Self-pay

## 2021-04-06 ENCOUNTER — Ambulatory Visit (HOSPITAL_COMMUNITY)
Admission: RE | Admit: 2021-04-06 | Discharge: 2021-04-06 | Disposition: A | Discharge status: Home / Self Care | Payer: PPO | Source: Ambulatory Visit | Attending: Adult Health | Admitting: Adult Health

## 2021-04-06 DIAGNOSIS — Z981 Arthrodesis status: Secondary | ICD-10-CM | POA: Diagnosis not present

## 2021-04-06 DIAGNOSIS — C61 Malignant neoplasm of prostate: Secondary | ICD-10-CM

## 2021-04-06 DIAGNOSIS — M160 Bilateral primary osteoarthritis of hip: Secondary | ICD-10-CM | POA: Diagnosis not present

## 2021-04-07 DIAGNOSIS — E785 Hyperlipidemia, unspecified: Secondary | ICD-10-CM

## 2021-04-07 DIAGNOSIS — I10 Essential (primary) hypertension: Secondary | ICD-10-CM | POA: Diagnosis not present

## 2021-04-07 DIAGNOSIS — I480 Paroxysmal atrial fibrillation: Secondary | ICD-10-CM

## 2021-04-08 ENCOUNTER — Telehealth: Payer: Self-pay | Admitting: *Deleted

## 2021-04-13 DIAGNOSIS — H353211 Exudative age-related macular degeneration, right eye, with active choroidal neovascularization: Secondary | ICD-10-CM | POA: Diagnosis not present

## 2021-04-19 ENCOUNTER — Ambulatory Visit (INDEPENDENT_AMBULATORY_CARE_PROVIDER_SITE_OTHER): Payer: PPO | Admitting: Family Medicine

## 2021-04-19 ENCOUNTER — Other Ambulatory Visit: Payer: Self-pay

## 2021-04-19 ENCOUNTER — Encounter: Payer: Self-pay | Admitting: Family Medicine

## 2021-04-19 VITALS — BP 118/72 | HR 66 | Resp 18 | Ht 71.0 in | Wt 206.0 lb

## 2021-04-19 DIAGNOSIS — I480 Paroxysmal atrial fibrillation: Secondary | ICD-10-CM

## 2021-04-19 DIAGNOSIS — I10 Essential (primary) hypertension: Secondary | ICD-10-CM

## 2021-04-19 DIAGNOSIS — Z0001 Encounter for general adult medical examination with abnormal findings: Secondary | ICD-10-CM | POA: Diagnosis not present

## 2021-04-19 DIAGNOSIS — E782 Mixed hyperlipidemia: Secondary | ICD-10-CM | POA: Diagnosis not present

## 2021-04-19 DIAGNOSIS — I700 Atherosclerosis of aorta: Secondary | ICD-10-CM

## 2021-04-19 DIAGNOSIS — Z0000 Encounter for general adult medical examination without abnormal findings: Secondary | ICD-10-CM

## 2021-04-19 NOTE — Progress Notes (Signed)
Subjective:    Patient ID: Tyler Allison, male    DOB: Sep 15, 1940, 80 y.o.   MRN: [id number]  HPI Subjective:   Patient presents for Medicare Annual/Subsequent preventive examination.  Tyler Allison is currently seeing urology to manage his PSA.  Due to the patient's age, Tyler Allison does not require colonoscopy.  Patient states that his most recent PSA and urology was 0!  Tyler Allison is due for Shingrix as well as the bioavailable COVID vaccine.  The remainder of his immunizations are listed below: Immunization History  Administered Date(s) Administered   Fluad Quad(high Dose 65+) 12/28/2018, 01/30/2020   Influenza Whole 02/09/2010   Influenza, High Dose Seasonal PF 02/15/2017, 02/01/2018   Influenza,inj,Quad PF,6+ Mos 02/13/2013, 02/25/2014, 03/13/2015, 02/11/2016, 01/19/2021   Influenza,inj,quad, With Preservative 02/06/2018   Influenza-Unspecified 03/19/2021   PFIZER(Purple Top)SARS-COV-2 Vaccination 06/20/2019, 07/13/2019, 05/11/2020   Pneumococcal Conjugate-13 03/14/2014   Pneumococcal Polysaccharide-23 05/09/2004, 12/05/2017   Td 01/07/2005   Zoster, Live 08/08/2006  Tyler Allison denies any falls, memory loss, or depression.  Overall Tyler Allison is doing very well with no concerns  Review Past Medical/Family/Social: Past Medical History:  Diagnosis Date   Allergy    Anxiety    Asthma    Cancer ([place])    prostate cancer   Cervical spondylosis with radiculopathy    left c6 radiculopathy   Chronic back pain    GERD (gastroesophageal reflux disease)    Hyperlipidemia    Hypertension    PAF (paroxysmal atrial fibrillation) (HCC)    Prostate cancer (HCC)    Past Surgical History:  Procedure Laterality Date   BACK SURGERY     MICROLARYNGOSCOPY W/VOCAL CORD INJECTION N/A 10/30/2020   Procedure: MICRODIRECT LARYNGOSCOPY WITH VOCAL CORD INJECTION AND CO2 LASER APPLICATION;  Surgeon: Melida Quitter, MD;  Location: [facility];  Service: ENT;  Laterality: N/A;   PROSTATE BIOPSY     SPINE SURGERY     back surgery x 3 including  lumbar fusion, neck surgery x 2 including C4-6 ACDF   tympanoplasty with mastoidectomy Left 01/13/2004   Current Outpatient Medications on File Prior to Visit  Medication Sig Dispense Refill   acetaminophen (TYLENOL) 500 MG tablet Take 500 mg by mouth every 6 (six) hours as needed for moderate pain.     albuterol (PROAIR HFA) 108 (90 BASE) MCG/ACT inhaler Inhale 2 puffs into the lungs every 4 (four) hours as needed. 8.5 g 3   atorvastatin (LIPITOR) 40 MG tablet TAKE 1 TABLET(40 MG) BY MOUTH DAILY 90 tablet 3   clonazePAM (KLONOPIN) 0.5 MG tablet TAKE 1 TABLET(0.5 MG) BY MOUTH TWICE DAILY AS NEEDED FOR ANXIETY (Patient taking differently: Take 0.5 mg by mouth 2 (two) times daily as needed for anxiety.) 60 tablet 1   Melatonin 10 MG TABS Take 10 mg by mouth at bedtime as needed (sleep).     metoprolol succinate (TOPROL-XL) 25 MG 24 hr tablet Take 1 tablet (25 mg total) by mouth daily. 90 tablet 2   Multiple Vitamins-Minerals (PRESERVISION AREDS 2) CAPS Take 1 capsule by mouth 2 (two) times daily.     pantoprazole (PROTONIX) 40 MG tablet TAKE 1 TABLET(40 MG) BY MOUTH TWICE DAILY 180 tablet 3   rivaroxaban (XARELTO) 20 MG TABS tablet Take 1 tablet by mouth daily with supper 90 tablet 1   tamsulosin (FLOMAX) 0.4 MG CAPS capsule Take 1 capsule (0.4 mg total) by mouth daily after supper. 90 capsule 3   traMADol (ULTRAM) 50 MG tablet Take 50 mg by mouth at bedtime as  needed for moderate pain.     venlafaxine (EFFEXOR) 25 MG tablet Take 25 mg by mouth 2 (two) times daily.     fluticasone (FLONASE) 50 MCG/ACT nasal spray USE 2 SPRAYS IN EACH NOSTRIL EVERY DAY AT NIGHT (Patient taking differently: Place 2 sprays into both nostrils at bedtime.) 16 g 6   No current facility-administered medications on file prior to visit.   Allergies  Allergen Reactions   Codeine Nausea And Vomiting   Social History   Socioeconomic History   Marital status: Married    Spouse name: Not on file   Number of children: 2    Years of education: Not on file   Highest education level: Not on file  Occupational History    Comment: retired  Tobacco Use   Smoking status: Former    Packs/day: 0.50    Years: 13.00    Pack years: 6.50    Types: Cigarettes    Start date: 23    Quit date: 10/23/1968    Years since quitting: 52.5   Smokeless tobacco: Never  Vaping Use   Vaping Use: Never used  Substance and Sexual Activity   Alcohol use: No   Drug use: No   Sexual activity: Not Currently    Comment: married, retired.  Other Topics Concern   Not on file  Social History Narrative   2 children but 1 deceased   Social Determinants of Health   Financial Resource Strain: Low Risk    Difficulty of Paying Living Expenses: Not hard at all  Food Insecurity: No Food Insecurity   Worried About Charity fundraiser in the Last Year: Never true   Arboriculturist in the Last Year: Never true  Transportation Needs: No Transportation Needs   Lack of Transportation (Medical): No   Lack of Transportation (Non-Medical): No  Physical Activity: Insufficiently Active   Days of Exercise per Week: 3 days   Minutes of Exercise per Session: 20 min  Stress: No Stress Concern Present   Feeling of Stress : Not at all  Social Connections: Socially Integrated   Frequency of Communication with Friends and Family: More than three times a week   Frequency of Social Gatherings with Friends and Family: More than three times a week   Attends Religious Services: More than 4 times per year   Active Member of [employer] or Organizations: Yes   Attends Music therapist: More than 4 times per year   Marital Status: Married  Human resources officer Violence: Not At Risk   Fear of Current or Ex-Partner: No   Emotionally Abused: No   Physically Abused: No   Sexually Abused: No   Family History  Problem Relation Age of Onset   Heart failure Mother    Esophageal cancer Father    Heart attack Maternal Uncle    Breast cancer Neg Hx     Prostate cancer Neg Hx    Pancreatic cancer Neg Hx    Colon cancer Neg Hx     Depression Screen  (Note: if answer to either of the following is "Yes", a more complete depression screening is indicated)  Over the past two weeks, have you felt down, depressed or hopeless? No Over the past two weeks, have you felt little interest or pleasure in doing things? No Have you lost interest or pleasure in daily life? No Do you often feel hopeless? No Do you cry easily over simple problems? No   Activities of Daily Living  In your present state of health, do you have any difficulty performing the following activities?:  Driving? No  Managing money? No  Feeding yourself? No  Getting from bed to chair? No  Climbing a flight of stairs? No  Preparing food and eating?: No  Bathing or showering? No  Getting dressed: No  Getting to the toilet? No  Using the toilet:No  Moving around from place to place: No  In the past year have you fallen or had a near fall?:No  Are you sexually active? No  Do you have more than one partner? No   Cognitive Testing  Alert? Yes Normal Appearance?Yes  Oriented to person? Yes Place? Yes  Time? Yes  Recall of three objects? Yes   Review of Systems  All other systems reviewed and are negative.     Objective:   Physical Exam Vitals reviewed.  Constitutional:      General: Tyler Allison is not in acute distress.    Appearance: Tyler Allison is well-developed. Tyler Allison is not diaphoretic.  HENT:     Head: Normocephalic and atraumatic.     Right Ear: External ear normal.     Left Ear: External ear normal.     Nose: Nose normal.     Mouth/Throat:     Pharynx: No oropharyngeal exudate.  Eyes:     General: No scleral icterus.       Right eye: No discharge.        Left eye: No discharge.     Conjunctiva/sclera: Conjunctivae normal.     Pupils: Pupils are equal, round, and reactive to light.  Neck:     Thyroid: No thyromegaly.     Vascular: No JVD.     Trachea: No tracheal  deviation.  Cardiovascular:     Rate and Rhythm: Normal rate and regular rhythm.     Heart sounds: Normal heart sounds. No murmur heard.   No friction rub. No gallop.  Pulmonary:     Effort: Pulmonary effort is normal. No respiratory distress.     Breath sounds: Normal breath sounds. No stridor. No wheezing or rales.  Chest:     Chest wall: No tenderness.  Abdominal:     General: Bowel sounds are normal. There is no distension.     Palpations: Abdomen is soft. There is no mass.     Tenderness: There is no abdominal tenderness. There is no guarding or rebound.  Musculoskeletal:     Cervical back: Neck supple.  Lymphadenopathy:     Cervical: No cervical adenopathy.  Skin:    General: Skin is warm.     Coloration: Skin is not pale.     Findings: No erythema or rash.  Neurological:     Mental Status: Tyler Allison is alert and oriented to person, place, and time.     Cranial Nerves: No cranial nerve deficit.     Motor: No abnormal muscle tone.     Coordination: Coordination normal.     Deep Tendon Reflexes: Reflexes are normal and symmetric.  Psychiatric:        Behavior: Behavior normal.        Thought Content: Thought content normal.        Judgment: Judgment normal.        Assessment & Plan:  Encounter for Medicare annual wellness exam  Paroxysmal atrial fibrillation ([place])  Primary hypertension  Mixed hyperlipidemia  Aortic atherosclerosis ([place]) Physical exam today is completely normal.  Blood pressure is outstanding.  Lab work is listed below  and is excellent aside from a mildly elevated blood sugar of 102: No visits with results within 1 Month(s) from this visit.  Latest known visit with results is:  Lab on 03/18/2021  Component Date Value Ref Range Status   WBC 03/18/2021 4.3  3.8 - 10.8 Thousand/uL Final   RBC 03/18/2021 4.18 (L)  4.20 - 5.80 Million/uL Final   Hemoglobin 03/18/2021 13.0 (L)  13.2 - 17.1 g/dL Final   HCT 03/18/2021 39.6  38.5 - 50.0 % Final   MCV  03/18/2021 94.7  80.0 - 100.0 fL Final   MCH 03/18/2021 31.1  27.0 - 33.0 pg Final   MCHC 03/18/2021 32.8  32.0 - 36.0 g/dL Final   RDW 03/18/2021 11.9  11.0 - 15.0 % Final   Platelets 03/18/2021 192  140 - 400 Thousand/uL Final   MPV 03/18/2021 9.9  7.5 - 12.5 fL Final   Neutro Abs 03/18/2021 2,670  1,500 - 7,800 cells/uL Final   Lymphs Abs 03/18/2021 675 (L)  850 - 3,900 cells/uL Final   Absolute Monocytes 03/18/2021 808  200 - 950 cells/uL Final   Eosinophils Absolute 03/18/2021 108  15 - 500 cells/uL Final   Basophils Absolute 03/18/2021 39  0 - 200 cells/uL Final   Neutrophils Relative % 03/18/2021 62.1  % Final   Total Lymphocyte 03/18/2021 15.7  % Final   Monocytes Relative 03/18/2021 18.8  % Final   Eosinophils Relative 03/18/2021 2.5  % Final   Basophils Relative 03/18/2021 0.9  % Final   Glucose, Bld 03/18/2021 102 (H)  65 - 99 mg/dL Final   Comment: .            Fasting reference interval . For someone without known diabetes, a glucose value between 100 and 125 mg/dL is consistent with prediabetes and should be confirmed with a follow-up test. .    BUN 03/18/2021 18  7 - 25 mg/dL Final   Creat 03/18/2021 0.84  0.70 - 1.22 mg/dL Final   eGFR 03/18/2021 88  > OR = 60 mL/min/1.69m Final   Comment: The eGFR is based on the CKD-EPI 2021 equation. To calculate  the new eGFR from a previous Creatinine or Cystatin C result, go to https://www.kidney.org/professionals/ kdoqi/gfr%5Fcalculator    BUN/Creatinine Ratio 122/29/7989NOT APPLICABLE  6 - 22 (calc) Final   Sodium 03/18/2021 139  135 - 146 mmol/L Final   Potassium 03/18/2021 4.3  3.5 - 5.3 mmol/L Final   Chloride 03/18/2021 102  98 - 110 mmol/L Final   CO2 03/18/2021 31  20 - 32 mmol/L Final   Calcium 03/18/2021 8.9  8.6 - 10.3 mg/dL Final   Total Protein 03/18/2021 6.1  6.1 - 8.1 g/dL Final   Albumin 03/18/2021 3.6  3.6 - 5.1 g/dL Final   Globulin 03/18/2021 2.5  1.9 - 3.7 g/dL (calc) Final   AG Ratio 03/18/2021  1.4  1.0 - 2.5 (calc) Final   Total Bilirubin 03/18/2021 0.6  0.2 - 1.2 mg/dL Final   Alkaline phosphatase (APISO) 03/18/2021 56  35 - 144 U/L Final   AST 03/18/2021 21  10 - 35 U/L Final   ALT 03/18/2021 17  9 - 46 U/L Final   Cholesterol 03/18/2021 169  <200 mg/dL Final   HDL 03/18/2021 60  > OR = 40 mg/dL Final   Triglycerides 03/18/2021 98  <150 mg/dL Final   LDL Cholesterol (Calc) 03/18/2021 90  mg/dL (calc) Final   Comment: Reference range: <100 . Desirable range <100 mg/dL for  primary prevention;   <70 mg/dL for patients with CHD or diabetic patients  with > or = 2 CHD risk factors. Marland Kitchen LDL-C is now calculated using the Martin-Hopkins  calculation, which is a validated novel method providing  better accuracy than the Friedewald equation in the  estimation of LDL-C.  Cresenciano Genre et al. Annamaria Helling. 2103;128(11): 2061-2068  (http://education.[employer].com/faq/FAQ164)    Total CHOL/HDL Ratio 03/18/2021 2.8  <5.0 (calc) Final   Non-HDL Cholesterol (Calc) 03/18/2021 109  <130 mg/dL (calc) Final   Comment: For patients with diabetes plus 1 major ASCVD risk  factor, treating to a non-HDL-C goal of <100 mg/dL  (LDL-C of <70 mg/dL) is considered a therapeutic  option.    Recommended the shingles vaccine as well as the bivalent COVID booster.  The remainder of his preventative care is up-to-date

## 2021-04-26 DIAGNOSIS — H353221 Exudative age-related macular degeneration, left eye, with active choroidal neovascularization: Secondary | ICD-10-CM | POA: Diagnosis not present

## 2021-05-26 DIAGNOSIS — H6122 Impacted cerumen, left ear: Secondary | ICD-10-CM | POA: Insufficient documentation

## 2021-05-26 DIAGNOSIS — H9212 Otorrhea, left ear: Secondary | ICD-10-CM | POA: Diagnosis not present

## 2021-05-26 DIAGNOSIS — Z87891 Personal history of nicotine dependence: Secondary | ICD-10-CM | POA: Diagnosis not present

## 2021-05-26 DIAGNOSIS — H73892 Other specified disorders of tympanic membrane, left ear: Secondary | ICD-10-CM | POA: Diagnosis not present

## 2021-05-26 DIAGNOSIS — L929 Granulomatous disorder of the skin and subcutaneous tissue, unspecified: Secondary | ICD-10-CM | POA: Diagnosis not present

## 2021-06-01 DIAGNOSIS — H353211 Exudative age-related macular degeneration, right eye, with active choroidal neovascularization: Secondary | ICD-10-CM | POA: Diagnosis not present

## 2021-06-01 DIAGNOSIS — M545 Low back pain, unspecified: Secondary | ICD-10-CM | POA: Diagnosis not present

## 2021-06-02 ENCOUNTER — Other Ambulatory Visit: Payer: Self-pay | Admitting: Neurological Surgery

## 2021-06-02 DIAGNOSIS — M545 Low back pain, unspecified: Secondary | ICD-10-CM

## 2021-06-03 ENCOUNTER — Telehealth: Payer: Self-pay | Admitting: Pharmacist

## 2021-06-03 NOTE — Chronic Care Management (AMB) (Addendum)
° ° °  Chronic Care Management Pharmacy Assistant   Name: Tyler Allison  MRN: [id number] DOB: 10/08/1940   Reason for Encounter: Medication Coordination Call    Recent office visits:  None  Recent consult visits:  None  Hospital visits:  None in previous 6 months  Medications: Outpatient Encounter Medications as of 06/03/2021  Medication Sig Note   acetaminophen (TYLENOL) 500 MG tablet Take 500 mg by mouth every 6 (six) hours as needed for moderate pain.    albuterol (PROAIR HFA) 108 (90 BASE) MCG/ACT inhaler Inhale 2 puffs into the lungs every 4 (four) hours as needed.    atorvastatin (LIPITOR) 40 MG tablet TAKE 1 TABLET(40 MG) BY MOUTH DAILY    clonazePAM (KLONOPIN) 0.5 MG tablet TAKE 1 TABLET(0.5 MG) BY MOUTH TWICE DAILY AS NEEDED FOR ANXIETY (Patient taking differently: Take 0.5 mg by mouth 2 (two) times daily as needed for anxiety.)    fluticasone (FLONASE) 50 MCG/ACT nasal spray USE 2 SPRAYS IN EACH NOSTRIL EVERY DAY AT NIGHT (Patient taking differently: Place 2 sprays into both nostrils at bedtime.)    Melatonin 10 MG TABS Take 10 mg by mouth at bedtime as needed (sleep).    metoprolol succinate (TOPROL-XL) 25 MG 24 hr tablet Take 1 tablet (25 mg total) by mouth daily.    Multiple Vitamins-Minerals (PRESERVISION AREDS 2) CAPS Take 1 capsule by mouth 2 (two) times daily.    pantoprazole (PROTONIX) 40 MG tablet TAKE 1 TABLET(40 MG) BY MOUTH TWICE DAILY    rivaroxaban (XARELTO) 20 MG TABS tablet Take 1 tablet by mouth daily with supper    tamsulosin (FLOMAX) 0.4 MG CAPS capsule Take 1 capsule (0.4 mg total) by mouth daily after supper.    traMADol (ULTRAM) 50 MG tablet Take 50 mg by mouth at bedtime as needed for moderate pain. 10/23/2020: Pt ran out, need to get new refill   venlafaxine (EFFEXOR) 25 MG tablet Take 25 mg by mouth 2 (two) times daily.    No facility-administered encounter medications on file as of 06/03/2021.   Reviewed chart for medication changes ahead of  medication coordination call.  No OVs, Consults, or hospital visits since last care coordination call/Pharmacist visit. (If appropriate, list visit date, provider name)  No medication changes indicated OR if recent visit, treatment plan here.  BP Readings from Last 3 Encounters:  04/19/21 118/72  02/10/21 122/74  12/11/20 110/72    No results found for: HGBA1C   Patient obtains medications through Adherence Packaging  90 Days   Patient is due for next adherence delivery on: 06/15/2021. Called patient and reviewed medications and coordinated delivery.  This delivery to include: Pantoprazole 40 mg 1 at breakfast, 1 at evening meal Tamsulosin 0.4 mg 1 at evening meal Metoprolol 25 mg 1 at evening meal Atorvastatin 40 mg 1 at evening meal  Confirmed delivery date of 06/15/2021, advised patient that pharmacy will contact them the morning of delivery.   Care Gaps: Medicare Annual Wellness: Completed 04/19/2021 Hemoglobin A1C: none available Colonoscopy: Aged out   Future Appointments  Date Time Provider [place]  10/14/2021  3:00 PM [facility]M PHARMACIST [facility] None  02/10/2022  9:30 AM Deneise Lever, MD [facility] None  02/11/2022  1:15 PM [facility]-NURSE HEALTH ADVISOR [facility] None    April D Calhoun, [place] Pharmacist Assistant [phone]   7 minutes spent in review, coordination, and documentation.  Reviewed by: [name], PharmD Clinical Pharmacist [phone]

## 2021-06-06 ENCOUNTER — Ambulatory Visit
Admission: RE | Admit: 2021-06-06 | Discharge: 2021-06-06 | Disposition: A | Discharge status: Home / Self Care | Payer: PPO | Source: Ambulatory Visit | Attending: Neurological Surgery | Admitting: Neurological Surgery

## 2021-06-06 ENCOUNTER — Other Ambulatory Visit: Payer: Self-pay

## 2021-06-06 DIAGNOSIS — M48061 Spinal stenosis, lumbar region without neurogenic claudication: Secondary | ICD-10-CM | POA: Diagnosis not present

## 2021-06-06 DIAGNOSIS — M545 Low back pain, unspecified: Secondary | ICD-10-CM

## 2021-06-07 DIAGNOSIS — H353221 Exudative age-related macular degeneration, left eye, with active choroidal neovascularization: Secondary | ICD-10-CM | POA: Diagnosis not present

## 2021-06-21 DIAGNOSIS — H722X2 Other marginal perforations of tympanic membrane, left ear: Secondary | ICD-10-CM | POA: Diagnosis not present

## 2021-06-21 DIAGNOSIS — H73892 Other specified disorders of tympanic membrane, left ear: Secondary | ICD-10-CM | POA: Insufficient documentation

## 2021-06-21 DIAGNOSIS — H7292 Unspecified perforation of tympanic membrane, left ear: Secondary | ICD-10-CM | POA: Insufficient documentation

## 2021-06-22 DIAGNOSIS — M545 Low back pain, unspecified: Secondary | ICD-10-CM | POA: Diagnosis not present

## 2021-07-12 DIAGNOSIS — H35361 Drusen (degenerative) of macula, right eye: Secondary | ICD-10-CM | POA: Diagnosis not present

## 2021-07-12 DIAGNOSIS — H35451 Secondary pigmentary degeneration, right eye: Secondary | ICD-10-CM | POA: Diagnosis not present

## 2021-07-12 DIAGNOSIS — H353231 Exudative age-related macular degeneration, bilateral, with active choroidal neovascularization: Secondary | ICD-10-CM | POA: Diagnosis not present

## 2021-07-12 DIAGNOSIS — Z961 Presence of intraocular lens: Secondary | ICD-10-CM | POA: Diagnosis not present

## 2021-07-12 DIAGNOSIS — H43811 Vitreous degeneration, right eye: Secondary | ICD-10-CM | POA: Diagnosis not present

## 2021-07-20 DIAGNOSIS — H353211 Exudative age-related macular degeneration, right eye, with active choroidal neovascularization: Secondary | ICD-10-CM | POA: Diagnosis not present

## 2021-07-26 DIAGNOSIS — H353221 Exudative age-related macular degeneration, left eye, with active choroidal neovascularization: Secondary | ICD-10-CM | POA: Diagnosis not present

## 2021-07-29 ENCOUNTER — Other Ambulatory Visit: Payer: Self-pay | Admitting: Interventional Cardiology

## 2021-07-29 NOTE — Telephone Encounter (Signed)
Prescription refill request for Xarelto received.  ?Indication: Afib  ?Last office visit: 08/18/20 Tyler Allison) ?Weight: 93.4kg ?Age: 81 ?Scr:0.84 (03/18/21) ?CrCl: 91.42m/min ? ?Appropriate dose and refill sent to requested pharmacy.  ?

## 2021-08-02 DIAGNOSIS — M9902 Segmental and somatic dysfunction of thoracic region: Secondary | ICD-10-CM | POA: Diagnosis not present

## 2021-08-02 DIAGNOSIS — M545 Low back pain, unspecified: Secondary | ICD-10-CM | POA: Diagnosis not present

## 2021-08-02 DIAGNOSIS — M9903 Segmental and somatic dysfunction of lumbar region: Secondary | ICD-10-CM | POA: Diagnosis not present

## 2021-08-02 DIAGNOSIS — M50322 Other cervical disc degeneration at C5-C6 level: Secondary | ICD-10-CM | POA: Diagnosis not present

## 2021-08-02 DIAGNOSIS — M5031 Other cervical disc degeneration,  high cervical region: Secondary | ICD-10-CM | POA: Diagnosis not present

## 2021-08-02 DIAGNOSIS — M5116 Intervertebral disc disorders with radiculopathy, lumbar region: Secondary | ICD-10-CM | POA: Diagnosis not present

## 2021-08-02 DIAGNOSIS — M9901 Segmental and somatic dysfunction of cervical region: Secondary | ICD-10-CM | POA: Diagnosis not present

## 2021-08-10 DIAGNOSIS — M50322 Other cervical disc degeneration at C5-C6 level: Secondary | ICD-10-CM | POA: Diagnosis not present

## 2021-08-10 DIAGNOSIS — M9901 Segmental and somatic dysfunction of cervical region: Secondary | ICD-10-CM | POA: Diagnosis not present

## 2021-08-10 DIAGNOSIS — M9902 Segmental and somatic dysfunction of thoracic region: Secondary | ICD-10-CM | POA: Diagnosis not present

## 2021-08-10 DIAGNOSIS — M545 Low back pain, unspecified: Secondary | ICD-10-CM | POA: Diagnosis not present

## 2021-08-10 DIAGNOSIS — M5116 Intervertebral disc disorders with radiculopathy, lumbar region: Secondary | ICD-10-CM | POA: Diagnosis not present

## 2021-08-10 DIAGNOSIS — M9903 Segmental and somatic dysfunction of lumbar region: Secondary | ICD-10-CM | POA: Diagnosis not present

## 2021-08-10 DIAGNOSIS — M5031 Other cervical disc degeneration,  high cervical region: Secondary | ICD-10-CM | POA: Diagnosis not present

## 2021-08-11 DIAGNOSIS — M5031 Other cervical disc degeneration,  high cervical region: Secondary | ICD-10-CM | POA: Diagnosis not present

## 2021-08-11 DIAGNOSIS — M5116 Intervertebral disc disorders with radiculopathy, lumbar region: Secondary | ICD-10-CM | POA: Diagnosis not present

## 2021-08-11 DIAGNOSIS — M9901 Segmental and somatic dysfunction of cervical region: Secondary | ICD-10-CM | POA: Diagnosis not present

## 2021-08-11 DIAGNOSIS — M50322 Other cervical disc degeneration at C5-C6 level: Secondary | ICD-10-CM | POA: Diagnosis not present

## 2021-08-11 DIAGNOSIS — M9902 Segmental and somatic dysfunction of thoracic region: Secondary | ICD-10-CM | POA: Diagnosis not present

## 2021-08-11 DIAGNOSIS — M545 Low back pain, unspecified: Secondary | ICD-10-CM | POA: Diagnosis not present

## 2021-08-11 DIAGNOSIS — M9903 Segmental and somatic dysfunction of lumbar region: Secondary | ICD-10-CM | POA: Diagnosis not present

## 2021-08-12 DIAGNOSIS — M9903 Segmental and somatic dysfunction of lumbar region: Secondary | ICD-10-CM | POA: Diagnosis not present

## 2021-08-12 DIAGNOSIS — M9902 Segmental and somatic dysfunction of thoracic region: Secondary | ICD-10-CM | POA: Diagnosis not present

## 2021-08-12 DIAGNOSIS — M5116 Intervertebral disc disorders with radiculopathy, lumbar region: Secondary | ICD-10-CM | POA: Diagnosis not present

## 2021-08-12 DIAGNOSIS — M545 Low back pain, unspecified: Secondary | ICD-10-CM | POA: Diagnosis not present

## 2021-08-12 DIAGNOSIS — M5031 Other cervical disc degeneration,  high cervical region: Secondary | ICD-10-CM | POA: Diagnosis not present

## 2021-08-12 DIAGNOSIS — M50322 Other cervical disc degeneration at C5-C6 level: Secondary | ICD-10-CM | POA: Diagnosis not present

## 2021-08-12 DIAGNOSIS — M9901 Segmental and somatic dysfunction of cervical region: Secondary | ICD-10-CM | POA: Diagnosis not present

## 2021-08-16 DIAGNOSIS — M5031 Other cervical disc degeneration,  high cervical region: Secondary | ICD-10-CM | POA: Diagnosis not present

## 2021-08-16 DIAGNOSIS — M50322 Other cervical disc degeneration at C5-C6 level: Secondary | ICD-10-CM | POA: Diagnosis not present

## 2021-08-16 DIAGNOSIS — M9902 Segmental and somatic dysfunction of thoracic region: Secondary | ICD-10-CM | POA: Diagnosis not present

## 2021-08-16 DIAGNOSIS — M5116 Intervertebral disc disorders with radiculopathy, lumbar region: Secondary | ICD-10-CM | POA: Diagnosis not present

## 2021-08-16 DIAGNOSIS — M545 Low back pain, unspecified: Secondary | ICD-10-CM | POA: Diagnosis not present

## 2021-08-16 DIAGNOSIS — M9903 Segmental and somatic dysfunction of lumbar region: Secondary | ICD-10-CM | POA: Diagnosis not present

## 2021-08-16 DIAGNOSIS — M9901 Segmental and somatic dysfunction of cervical region: Secondary | ICD-10-CM | POA: Diagnosis not present

## 2021-08-17 DIAGNOSIS — M9902 Segmental and somatic dysfunction of thoracic region: Secondary | ICD-10-CM | POA: Diagnosis not present

## 2021-08-17 DIAGNOSIS — M50322 Other cervical disc degeneration at C5-C6 level: Secondary | ICD-10-CM | POA: Diagnosis not present

## 2021-08-17 DIAGNOSIS — M545 Low back pain, unspecified: Secondary | ICD-10-CM | POA: Diagnosis not present

## 2021-08-17 DIAGNOSIS — M5031 Other cervical disc degeneration,  high cervical region: Secondary | ICD-10-CM | POA: Diagnosis not present

## 2021-08-17 DIAGNOSIS — M5116 Intervertebral disc disorders with radiculopathy, lumbar region: Secondary | ICD-10-CM | POA: Diagnosis not present

## 2021-08-17 DIAGNOSIS — M9901 Segmental and somatic dysfunction of cervical region: Secondary | ICD-10-CM | POA: Diagnosis not present

## 2021-08-17 DIAGNOSIS — M9903 Segmental and somatic dysfunction of lumbar region: Secondary | ICD-10-CM | POA: Diagnosis not present

## 2021-08-18 DIAGNOSIS — M1611 Unilateral primary osteoarthritis, right hip: Secondary | ICD-10-CM | POA: Diagnosis not present

## 2021-08-18 DIAGNOSIS — M25551 Pain in right hip: Secondary | ICD-10-CM | POA: Diagnosis not present

## 2021-08-18 DIAGNOSIS — M7061 Trochanteric bursitis, right hip: Secondary | ICD-10-CM | POA: Diagnosis not present

## 2021-08-19 ENCOUNTER — Ambulatory Visit: Payer: PPO | Admitting: Nurse Practitioner

## 2021-08-19 DIAGNOSIS — M545 Low back pain, unspecified: Secondary | ICD-10-CM | POA: Diagnosis not present

## 2021-08-19 DIAGNOSIS — M9902 Segmental and somatic dysfunction of thoracic region: Secondary | ICD-10-CM | POA: Diagnosis not present

## 2021-08-19 DIAGNOSIS — M5031 Other cervical disc degeneration,  high cervical region: Secondary | ICD-10-CM | POA: Diagnosis not present

## 2021-08-19 DIAGNOSIS — M5116 Intervertebral disc disorders with radiculopathy, lumbar region: Secondary | ICD-10-CM | POA: Diagnosis not present

## 2021-08-19 DIAGNOSIS — M9901 Segmental and somatic dysfunction of cervical region: Secondary | ICD-10-CM | POA: Diagnosis not present

## 2021-08-19 DIAGNOSIS — M9903 Segmental and somatic dysfunction of lumbar region: Secondary | ICD-10-CM | POA: Diagnosis not present

## 2021-08-19 DIAGNOSIS — M50322 Other cervical disc degeneration at C5-C6 level: Secondary | ICD-10-CM | POA: Diagnosis not present

## 2021-08-20 ENCOUNTER — Ambulatory Visit: Payer: PPO | Admitting: Nurse Practitioner

## 2021-08-20 ENCOUNTER — Encounter: Payer: Self-pay | Admitting: Nurse Practitioner

## 2021-08-20 ENCOUNTER — Ambulatory Visit: Payer: PPO

## 2021-08-20 VITALS — BP 116/70 | HR 72 | Temp 97.9°F | Ht 69.0 in | Wt 206.2 lb

## 2021-08-20 DIAGNOSIS — G4731 Primary central sleep apnea: Secondary | ICD-10-CM | POA: Diagnosis not present

## 2021-08-20 DIAGNOSIS — J4531 Mild persistent asthma with (acute) exacerbation: Secondary | ICD-10-CM | POA: Diagnosis not present

## 2021-08-20 DIAGNOSIS — J301 Allergic rhinitis due to pollen: Secondary | ICD-10-CM | POA: Diagnosis not present

## 2021-08-20 DIAGNOSIS — J302 Other seasonal allergic rhinitis: Secondary | ICD-10-CM | POA: Insufficient documentation

## 2021-08-20 LAB — NITRIC OXIDE
FeNO level (ppb): 25
FeNO level (ppb): 32

## 2021-08-20 MED ORDER — BENZONATATE 200 MG PO CAPS: 200.0000 mg | ORAL_CAPSULE | Freq: Three times a day (TID) | ORAL | 1 refills | Status: DC | PRN

## 2021-08-20 MED ORDER — METHYLPREDNISOLONE ACETATE 80 MG/ML IJ SUSP
80.0000 mg | Freq: Once | INTRAMUSCULAR | Status: AC
  Administered 2021-08-20: 80 mg via INTRAMUSCULAR

## 2021-08-20 MED ORDER — LORATADINE 10 MG PO TABS: 10.0000 mg | ORAL_TABLET | Freq: Every day | ORAL | 5 refills | Status: DC

## 2021-08-20 MED ORDER — BUDESONIDE-FORMOTEROL FUMARATE 80-4.5 MCG/ACT IN AERO: 2.0000 | INHALATION_SPRAY | Freq: Two times a day (BID) | RESPIRATORY_TRACT | 5 refills | Status: DC

## 2021-08-20 MED ORDER — PREDNISONE 10 MG PO TABS: ORAL_TABLET | ORAL | 0 refills | Status: DC

## 2021-08-20 NOTE — Assessment & Plan Note (Signed)
Start Claritin. Continue postnasal drainage control with flonase.  ?

## 2021-08-20 NOTE — Patient Instructions (Addendum)
Continue Albuterol inhaler 2 puffs every 6 hours as needed for shortness of breath or wheezing. Notify if symptoms persist despite rescue inhaler/neb use. ?Continue flonase 2 sprays each nostril at bedtime  ?Continue pantoprazole 40 mg daily  ? ?-Symbicort 2 puffs Twice daily. Brush tongue and rinse mouth afterwards ?-Claritin 10 mg daily for allergies  ?-Mucinex DM Twice daily for chest congestion/cough ?-Chlortab over the counter 4 mg at bedtime for cough  ?-Tessalon perles (benzonatate) 1 capsule every 8 hours as needed for cough ?-Prednisone taper. 4 tabs for 2 days, then 3 tabs for 2 days, 2 tabs for 2 days, then 1 tab for 2 days, then stop. Take in AM with food. Start tomorrow.  ? ?Follow up in 2 weeks with Dr. Annamaria Boots or Alanson Aly. If symptoms do not improve or worsen, please contact office for sooner follow up or seek emergency care. ?

## 2021-08-20 NOTE — Assessment & Plan Note (Signed)
Appears stable overall from sleep disorder standpoint. Continue to monitor symptoms.  ?

## 2021-08-20 NOTE — Progress Notes (Signed)
? ?'@Patient'$  ID: Tyler Allison, male    DOB: [birth year], 81 y.o.   MRN: [id number] ? ?Chief Complaint  ?Patient presents with  ? Follow-up  ?  Follow up. Patient says his breath isn't going down good and he's been using his inhaler everyday for the past few weeks.   ? ? ?Referring provider: ?[name], MD ? ?HPI: ?81 year old male, former smoker followed for asthmatic bronchitis, central sleep apneas.  He is a patient Dr. Janee Morn and last seen in office 02/10/2021.  Past medical history significant for hypertension, hypercholesterol, PAF, lung nodule (2012), cervical spondylosis, dysphagia. ? ?TEST/EVENTS:  ?01/15/2019 PFTs: Minimal obstruction, no significant response to BD.  Normal lung volumes with normal diffusion capacity. ?06/21/2019 CTA chest: Aortic atherosclerosis.  The lungs were clear.  No acute or chronic diseases noted. ?12/04/2019 HST: AHI 11.8/h, mostly central apneas.  There was a desaturation to 85%. ?08/17/2020 ONO on room air: 12 to 13 minutes spent less than or equal to 88%.  Likely REM associated ? ?02/10/2021: OV with Dr. Annamaria Boots.  Previously seen by Dr. Redmond Baseman with ENT for hoarseness and hemoptysis.  Treated with resection of varix and injection of cords for bowing.  Improvement in voice after this.  Recently completed XRT for prostate cancer.  Reported a morning productive cough otherwise good throughout the day.  DOE had also improved.  Symbicort does not seem to make much of a difference.  Previous ONO with very minimal time spent below 88% (12 to 13 minutes) likely REM associated.  Did not feel like nocturnal oxygen was necessary at this point.  Suspected CSA likely reflects some feedback delay due to cardiovascular disease.  No current symptoms at this time.  Repeat sleep study in future as needed.  Felt well controlled on just occasional use of albuterol.  Plan to watch to resume maintenance as needed. ? ?08/20/2021: Today-acute visit ?Patient presents today for persistent cough over the  past 2 to 3 weeks.  Reports that is primarily productive with white sputum.  He has also had some increased wheezing and shortness of breath upon exertion.  Wheezing mostly occurs at night.  Feels like his symptoms worsened when his allergies flared.  Does not currently take any over-the-counter antihistamine.  Uses Flonase nightly.  He has been having to use his albuterol more frequently.  Usually only has to use it a couple times a year.  Past couple days has been using it 3-4 times a day.  Denies any recent fevers or interim sick exposures.  No orthopnea, PND, hemoptysis or lower extremity swelling.  No current symptoms associated with CSA ? ?FeNO 32 pbb ? ?Allergies  ?Allergen Reactions  ? Codeine Nausea And Vomiting  ? ? ?Immunization History  ?Administered Date(s) Administered  ? Fluad Quad(high Dose 65+) 12/28/2018, 01/30/2020  ? Influenza Whole 02/09/2010  ? Influenza, High Dose Seasonal PF 02/15/2017, 02/01/2018  ? Influenza,inj,Quad PF,6+ Mos 02/13/2013, 02/25/2014, 03/13/2015, 02/11/2016, 01/19/2021  ? Influenza,inj,quad, With Preservative 02/06/2018  ? Influenza-Unspecified 03/19/2021  ? PFIZER(Purple Top)SARS-COV-2 Vaccination 06/20/2019, 07/13/2019, 05/11/2020  ? Pneumococcal Conjugate-13 03/14/2014  ? Pneumococcal Polysaccharide-23 05/09/2004, 12/05/2017  ? Td 01/07/2005  ? Zoster, Live 08/08/2006  ? ? ?Past Medical History:  ?Diagnosis Date  ? Allergy   ? Anxiety   ? Asthma   ? Cancer [facility])   ? prostate cancer  ? Cervical spondylosis with radiculopathy   ? left c6 radiculopathy  ? Chronic back pain   ? GERD (gastroesophageal reflux disease)   ?  Hyperlipidemia   ? Hypertension   ? PAF (paroxysmal atrial fibrillation) ([place])   ? Prostate cancer ([place])   ? ? ?Tobacco History: ?Social History  ? ?Tobacco Use  ?Smoking Status Former  ? Packs/day: 0.50  ? Years: 13.00  ? Pack years: 6.50  ? Types: Cigarettes  ? Start date: 24  ? Quit date: 10/23/1968  ? Years since quitting: 52.8  ?Smokeless Tobacco Never   ? ?Counseling given: Not Answered ? ? ?Outpatient Medications Prior to Visit  ?Medication Sig Dispense Refill  ? acetaminophen (TYLENOL) 500 MG tablet Take 500 mg by mouth every 6 (six) hours as needed for moderate pain.    ? albuterol (PROAIR HFA) 108 (90 BASE) MCG/ACT inhaler Inhale 2 puffs into the lungs every 4 (four) hours as needed. 8.5 g 3  ? atorvastatin (LIPITOR) 40 MG tablet TAKE 1 TABLET(40 MG) BY MOUTH DAILY 90 tablet 3  ? clonazePAM (KLONOPIN) 0.5 MG tablet TAKE 1 TABLET(0.5 MG) BY MOUTH TWICE DAILY AS NEEDED FOR ANXIETY (Patient taking differently: Take 0.5 mg by mouth 2 (two) times daily as needed for anxiety.) 60 tablet 1  ? Melatonin 10 MG TABS Take 10 mg by mouth at bedtime as needed (sleep).    ? metoprolol succinate (TOPROL-XL) 25 MG 24 hr tablet Take 1 tablet (25 mg total) by mouth daily. 90 tablet 2  ? Multiple Vitamins-Minerals (PRESERVISION AREDS 2) CAPS Take 1 capsule by mouth 2 (two) times daily.    ? pantoprazole (PROTONIX) 40 MG tablet TAKE 1 TABLET(40 MG) BY MOUTH TWICE DAILY 180 tablet 3  ? tamsulosin (FLOMAX) 0.4 MG CAPS capsule Take 1 capsule (0.4 mg total) by mouth daily after supper. 90 capsule 3  ? traMADol (ULTRAM) 50 MG tablet Take 50 mg by mouth at bedtime as needed for moderate pain.    ? venlafaxine (EFFEXOR) 25 MG tablet Take 25 mg by mouth 2 (two) times daily.    ? XARELTO 20 MG TABS tablet TAKE ONE TABLET BY MOUTH daily with SUPPER 90 tablet 1  ? fluticasone (FLONASE) 50 MCG/ACT nasal spray USE 2 SPRAYS IN EACH NOSTRIL EVERY DAY AT NIGHT (Patient taking differently: Place 2 sprays into both nostrils at bedtime.) 16 g 6  ? ?No facility-administered medications prior to visit.  ? ? ? ?Review of Systems:  ? ?Constitutional: No weight loss or gain, night sweats, fevers, chills, fatigue, or lassitude. ?HEENT: No headaches, difficulty swallowing, tooth/dental problems, or sore throat. No sneezing, itching, ear ache +nasal congestion/drainage (clear to white) ?CV:  No chest pain,  orthopnea, PND, swelling in lower extremities, anasarca, dizziness, palpitations, syncope ?Resp: +shortness of breath with exertion; productive cough (white); nocturnal wheeze. No hemoptysis. No chest wall deformity ?GI:  No heartburn, indigestion, abdominal pain, nausea, vomiting, diarrhea, change in bowel habits, loss of appetite, bloody stools.  ?Skin: No rash, lesions, ulcerations ?Neuro: No dizziness or lightheadedness.  ?Psych: No depression or anxiety. Mood stable.  ? ? ? ?Physical Exam: ? ?BP 116/70 (BP Location: Right Arm, Patient Position: Sitting, Cuff Size: Normal)   Pulse 72   Temp 97.9 ?F (36.6 ?C) (Oral)   Ht '5\' 9"'$  (1.753 m)   Wt 206 lb 3.2 oz (93.5 kg)   SpO2 93%   BMI 30.45 kg/m?  ? ?GEN: Pleasant, interactive, well-appearing; obese; in no acute distress ?HEENT:  Normocephalic and atraumatic. PERRLA. Sclera white. Nasal turbinates erythematous, moist and patent bilaterally. White rhinorrhea present. Oropharynx erythematous and moist, without exudate or edema. No lesions, ulcerations. Frequent  throat clearing during exam ?NECK:  Supple w/ fair ROM. No JVD present. Normal carotid impulses w/o bruits. Thyroid symmetrical with no goiter or nodules palpated. No lymphadenopathy.   ?CV: RRR, no m/r/g, no peripheral edema. Pulses intact, +2 bilaterally. No cyanosis, pallor or clubbing. ?PULMONARY:  Unlabored, regular breathing. Scattered wheezes bilaterally A&P. Occasional congested cough. No accessory muscle use. No dullness to percussion. ?GI: BS present and normoactive. Soft, non-tender to palpation. No organomegaly or masses detected. No CVA tenderness. ?Neuro: A/Ox3. No focal deficits noted.   ?Skin: Warm, no lesions or rashe ?Psych: Normal affect and behavior. Judgement and thought content appropriate.  ? ? ? ?Lab Results: ? ?CBC ?   ?Component Value Date/Time  ? WBC 4.3 03/18/2021 0806  ? RBC 4.18 (L) 03/18/2021 0806  ? HGB 13.0 (L) 03/18/2021 0806  ? HGB 15.5 08/28/2018 1404  ? HCT 39.6  03/18/2021 0806  ? HCT 44.0 08/28/2018 1404  ? PLT 192 03/18/2021 0806  ? PLT 277 08/28/2018 1404  ? MCV 94.7 03/18/2021 0806  ? MCV 87 08/28/2018 1404  ? MCH 31.1 03/18/2021 0806  ? MCHC 32.8 03/18/2021 0806  ? RDW

## 2021-08-20 NOTE — Assessment & Plan Note (Addendum)
Acute flare with worsening allergy symptoms. Restart maintenance inhaler with Symbicort 2 puffs Twice daily. Depo 80 mg inj x 1 in office. Prednisone taper advised. Discussed that I would hold off on abx for now unless evidence of superimposed infection on CXR. Target trigger prevention with Claritin; believe there is a upper airway component to cough as well given symptoms. Cough control measures.  ? ?Patient Instructions  ?Continue Albuterol inhaler 2 puffs every 6 hours as needed for shortness of breath or wheezing. Notify if symptoms persist despite rescue inhaler/neb use. ?Continue flonase 2 sprays each nostril at bedtime  ?Continue pantoprazole 40 mg daily  ? ?-Symbicort 2 puffs Twice daily. Brush tongue and rinse mouth afterwards ?-Claritin 10 mg daily for allergies  ?-Mucinex DM Twice daily for chest congestion/cough ?-Chlortab over the counter 4 mg at bedtime for cough  ?-Tessalon perles (benzonatate) 1 capsule every 8 hours as needed for cough ?-Prednisone taper. 4 tabs for 2 days, then 3 tabs for 2 days, 2 tabs for 2 days, then 1 tab for 2 days, then stop. Take in AM with food. Start tomorrow.  ? ?Follow up in 2 weeks with Dr. Annamaria Boots or Alanson Aly. If symptoms do not improve or worsen, please contact office for sooner follow up or seek emergency care. ? ? ?

## 2021-08-23 ENCOUNTER — Telehealth: Payer: Self-pay | Admitting: Nurse Practitioner

## 2021-08-23 NOTE — Telephone Encounter (Signed)
I called and spoke with the pt  ?I notified that symbicort should be 2 puffs bid scheduled and the albuterol is to be used prn  ?He verbalized understanding  ?Nothing further needed ?

## 2021-08-25 ENCOUNTER — Telehealth: Payer: Self-pay

## 2021-08-25 MED ORDER — FLUTICASONE PROPIONATE 50 MCG/ACT NA SUSP: NASAL | 6 refills | Status: DC

## 2021-08-25 NOTE — Telephone Encounter (Signed)
Pt called in requesting a refill of  ? ?fluticasone (FLONASE) 50 MCG/ACT nasal spray [[id number]]  ENDED ?  Order Details ?Dose, Route, Frequency: As Directed  ?Dispense Quantity: 16 g Refills: 6   ?     ?Sig: USE 2 SPRAYS IN EACH NOSTRIL EVERY DAY AT NIGHT  ?Patient taking differently: Place 2 sprays into both nostrils at bedtime.  ?     ?Start Date: 06/08/20 End Date: 10/23/20  ?Written Date: 06/08/20 Expiration Date: 06/08/21  ? ?Cb#: [phone] ?

## 2021-08-25 NOTE — Telephone Encounter (Signed)
Rx sent to pharmacy   

## 2021-08-31 DIAGNOSIS — H353211 Exudative age-related macular degeneration, right eye, with active choroidal neovascularization: Secondary | ICD-10-CM | POA: Diagnosis not present

## 2021-09-01 ENCOUNTER — Telehealth: Payer: Self-pay | Admitting: Pharmacist

## 2021-09-01 NOTE — Progress Notes (Signed)
? ? ?Chronic Care Management ?Pharmacy Assistant  ? ?Name: Tyler Allison  MRN: [id number] DOB: [birth year] ? ? ?Reason for Encounter: Medication Coordination Call ?  ? ?Recent office visits:  ?None ? ?Recent consult visits:  ?08/20/2021 OV (Pulmonology) Clayton Bibles, NP;  ?-Symbicort 2 puffs Twice daily. Brush tongue and rinse mouth afterwards ?-Claritin 10 mg daily for allergies  ?-Mucinex DM Twice daily for chest congestion/cough ?-Chlortab over the counter 4 mg at bedtime for cough  ?-Tessalon perles (benzonatate) 1 capsule every 8 hours as needed for cough ?-Prednisone taper. 4 tabs for 2 days, then 3 tabs for 2 days, 2 tabs for 2 days, then 1 tab for 2 days, then stop. Take in AM with food. Start tomo ? ?Hospital visits:  ?None in previous 6 months ? ?Medications: ?Outpatient Encounter Medications as of 09/01/2021  ?Medication Sig Note  ? acetaminophen (TYLENOL) 500 MG tablet Take 500 mg by mouth every 6 (six) hours as needed for moderate pain.   ? albuterol (PROAIR HFA) 108 (90 BASE) MCG/ACT inhaler Inhale 2 puffs into the lungs every 4 (four) hours as needed.   ? atorvastatin (LIPITOR) 40 MG tablet TAKE 1 TABLET(40 MG) BY MOUTH DAILY   ? benzonatate (TESSALON) 200 MG capsule Take 1 capsule (200 mg total) by mouth 3 (three) times daily as needed for cough.   ? budesonide-formoterol (SYMBICORT) 80-4.5 MCG/ACT inhaler Inhale 2 puffs into the lungs 2 (two) times daily.   ? clonazePAM (KLONOPIN) 0.5 MG tablet TAKE 1 TABLET(0.5 MG) BY MOUTH TWICE DAILY AS NEEDED FOR ANXIETY (Patient taking differently: Take 0.5 mg by mouth 2 (two) times daily as needed for anxiety.)   ? fluticasone (FLONASE) 50 MCG/ACT nasal spray Use 2 Sprays in each nostril every day at night   ? loratadine (CLARITIN) 10 MG tablet Take 1 tablet (10 mg total) by mouth daily.   ? Melatonin 10 MG TABS Take 10 mg by mouth at bedtime as needed (sleep).   ? metoprolol succinate (TOPROL-XL) 25 MG 24 hr tablet Take 1 tablet (25 mg total) by mouth  daily.   ? Multiple Vitamins-Minerals (PRESERVISION AREDS 2) CAPS Take 1 capsule by mouth 2 (two) times daily.   ? pantoprazole (PROTONIX) 40 MG tablet TAKE 1 TABLET(40 MG) BY MOUTH TWICE DAILY   ? predniSONE (DELTASONE) 10 MG tablet 4 tabs for 2 days, then 3 tabs for 2 days, 2 tabs for 2 days, then 1 tab for 2 days, then stop   ? tamsulosin (FLOMAX) 0.4 MG CAPS capsule Take 1 capsule (0.4 mg total) by mouth daily after supper.   ? traMADol (ULTRAM) 50 MG tablet Take 50 mg by mouth at bedtime as needed for moderate pain. 10/23/2020: Pt ran out, need to get new refill  ? venlafaxine (EFFEXOR) 25 MG tablet Take 25 mg by mouth 2 (two) times daily.   ? XARELTO 20 MG TABS tablet TAKE ONE TABLET BY MOUTH daily with SUPPER   ? ?No facility-administered encounter medications on file as of 09/01/2021.  ? ?Reviewed chart for medication changes ahead of medication coordination call. ? ?BP Readings from Last 3 Encounters:  ?08/20/21 116/70  ?04/19/21 118/72  ?02/10/21 122/74  ?  ?No results found for: HGBA1C  ? ?Patient obtains medications through Adherence Packaging  90 Days  ? ?Last adherence delivery included:  ?Pantoprazole 40 mg 1 at breakfast, 1 at evening meal ?Tamsulosin 0.4 mg 1 at evening meal ?Metoprolol 25 mg 1 at evening meal ?Atorvastatin 40 mg  1 at evening meal ? ?Patient is due for next adherence delivery on: 09/13/2021. ?Called patient and reviewed medications and coordinated delivery. ? ?This delivery to include: ?Pantoprazole 40 mg 1 at breakfast, 1 at evening meal ?Tamsulosin 0.4 mg 1 at evening meal ?Metoprolol 25 mg 1 at evening meal ?Atorvastatin 40 mg 1 at evening meal ? ?Patient declined the following medications: ?Symbicort - patient states does not work well and is expensive. ?Loratadine - patient states he has supply on hand ?Pantoprazole - patient states he has supply on hand ? ?Confirmed delivery date of 09/13/2021, advised patient that pharmacy will contact them the morning of delivery. ? ?Care  Gaps: ?Medicare Annual Wellness: Completed 04/19/2021 ?Hemoglobin A1C: none available ?Colonoscopy: Aged out ? ?Future Appointments  ?Date Time Provider [place]  ?09/03/2021  9:30 AM Cobb, Karie Schwalbe, NP [facility] None  ?09/30/2021  1:30 PM Leanor Kail, PA [facility]  ?01/20/2022  9:00 AM [facility] PHARMACIST [facility]  ?02/10/2022  9:30 AM Young, Kasandra Knudsen, MD [facility] None  ?02/18/2022  2:00 PM [facility]SE HEALTH ADVISOR [facility]  ? ?April D Calhoun, [place] ?Clinical Pharmacist Assistant ?[phone] ?

## 2021-09-03 ENCOUNTER — Ambulatory Visit (INDEPENDENT_AMBULATORY_CARE_PROVIDER_SITE_OTHER): Payer: PPO

## 2021-09-03 ENCOUNTER — Encounter: Payer: Self-pay | Admitting: Nurse Practitioner

## 2021-09-03 ENCOUNTER — Telehealth: Payer: Self-pay | Admitting: Nurse Practitioner

## 2021-09-03 ENCOUNTER — Ambulatory Visit: Payer: PPO | Admitting: Nurse Practitioner

## 2021-09-03 VITALS — BP 108/64 | HR 61 | Temp 98.1°F | Ht 69.0 in | Wt 205.4 lb

## 2021-09-03 DIAGNOSIS — J4531 Mild persistent asthma with (acute) exacerbation: Secondary | ICD-10-CM

## 2021-09-03 DIAGNOSIS — K219 Gastro-esophageal reflux disease without esophagitis: Secondary | ICD-10-CM

## 2021-09-03 DIAGNOSIS — J301 Allergic rhinitis due to pollen: Secondary | ICD-10-CM

## 2021-09-03 DIAGNOSIS — R059 Cough, unspecified: Secondary | ICD-10-CM | POA: Diagnosis not present

## 2021-09-03 MED ORDER — PREDNISONE 10 MG PO TABS: ORAL_TABLET | ORAL | 0 refills | Status: DC

## 2021-09-03 MED ORDER — AZITHROMYCIN 250 MG PO TABS: ORAL_TABLET | ORAL | 0 refills | Status: DC

## 2021-09-03 NOTE — Assessment & Plan Note (Signed)
Improved control with addition of Claritin.  Continue trigger prevention.  Continue postnasal drainage control with intranasal steroid. ?

## 2021-09-03 NOTE — Progress Notes (Signed)
Attempted to notify pt that his CXR did not show any evidence of pneumonia. Lungs were clear. Left VM.

## 2021-09-03 NOTE — Progress Notes (Signed)
Spoke with pt and notified of results per Katie Cobb, NP. Pt verbalized understanding and denied any questions. ?

## 2021-09-03 NOTE — Patient Instructions (Addendum)
Continue Symbicort 2 puffs Twice daily. Brush tongue and rinse mouth afterwards ?Continue Albuterol inhaler 2 puffs every 6 hours as needed for shortness of breath or wheezing. Notify if symptoms persist despite rescue inhaler/neb use. ?Continue flonase 2 sprays each nostril at bedtime  ?Continue pantoprazole 40 mg daily  ?Continue Claritin 10 mg daily for allergies  ?Continue Mucinex DM Twice daily for chest congestion/cough ?Continue Chlortab over the counter 4 mg at bedtime for cough  ? ?-Prednisone taper. 4 tabs for 3 days, then 3 tabs for 3 days, 2 tabs for 3 days, then 1 tab for 3 days, then stop. Take in AM with food.  ?-Z pack. Take 2 tabs on day one followed by 1 tab daily for four additional days. Take with food.  ?-Flutter valve 2-3 times a day ? ?Chest x ray today  ? ?Follow up in 1 month with Dr. Annamaria Boots or Alanson Aly. If symptoms do not improve or worsen, please contact office for sooner follow up or seek emergency care. ?

## 2021-09-03 NOTE — Assessment & Plan Note (Addendum)
Well-controlled on PPI.  Continue Protonix as ordered. ?

## 2021-09-03 NOTE — Progress Notes (Signed)
? ?'@Patient'$  ID: EDU ON, male    DOB: Nov 17, 1940, 81 y.o.   MRN: [id number] ? ?Chief Complaint  ?Patient presents with  ? Follow-up  ?  Patient says he is breathing better but still coughing up phlegm.   ? ? ?Referring provider: ?[name], MD ? ?HPI: ?81 year old male, former smoker followed for asthmatic bronchitis, central sleep apneas.  He is a patient Dr. Janee Morn and last seen in office 08/20/2021 by Belenda Cruise NP.  Past medical history significant for hypertension, hypercholesterol, PAF, lung nodule (2012), cervical spondylosis, dysphagia. ? ?TEST/EVENTS:  ?01/15/2019 PFTs: Minimal obstruction, no significant response to BD.  Normal lung volumes with normal diffusion capacity ?04/19/2020 CTA chest: Aortic atherosclerosis.  The lungs were clear.  No acute or chronic disease is noted. ?12/04/2019 HST: AHI 11.8/h, mostly central apneas.  There was a desaturation 85% ?08/17/2020 Ono on room air: 12 to 13 minutes spent less than or equal to 88%.  Likely REM associated ? ?02/10/2021: OV with Dr. Annamaria Boots.  Previously seen by Dr. Redmond Baseman with ENT for hoarseness and hemoptysis.  Treated with resection of varix and injection of cords for bowing.  Improvement in voice after this.  Recently completed XRT for prostate cancer.  Reported a morning productive cough otherwise good throughout the day.  DOE had also improved.  Symbicort does not seem to make much of a difference.  Previous ONO with very minimal time spent below 88% (12 to 13 minutes) likely REM associated.  Did not feel like nocturnal oxygen was necessary at this point.  Suspected CSA likely reflects some feedback delay due to cardiovascular disease.  No current symptoms at this time.  Repeat sleep study in future as needed.  Felt well controlled on just occasional use of albuterol.  Plan to watch to resume maintenance as needed. ? ?08/20/2021: OV with Keyunna Coco NP.  Worsening cough over the past 2 to 3 weeks with increased shortness of breath and wheezing.  Increased  use of albuterol, using a couple times a day. FeNO 32.  Treated for acute asthmatic bronchitis exacerbation with Depo injection and prednisone taper.  CXR ordered which was never obtained.  Target trigger prevention with Claritin.  Initiated cough control measures as well. ? ?09/03/2021: Today-follow-up ?Patient presents today for follow-up after being treated for acute asthma flare.  Reports that he feels like his breathing has improved.  He does continue to have a persistent, productive cough with white to gray sputum production.  Feels like his allergy symptoms are well controlled.  He does still notice an occasional wheeze at night.  He has decreased the amount of times he has to use albuterol.  Denies any hemoptysis, lower extremity swelling, anorexia, weight loss, recent fevers.  He continues on Symbicort twice daily.  Was curious if he would need to continue to use this.  Feels like the co-pay is a little expensive $45 a month.  Continues to use Claritin and Flonase daily and feels like allergy symptoms are well controlled. ? ?Allergies  ?Allergen Reactions  ? Codeine Nausea And Vomiting  ? ? ?Immunization History  ?Administered Date(s) Administered  ? Fluad Quad(high Dose 65+) 12/28/2018, 01/30/2020  ? Influenza Whole 02/09/2010  ? Influenza, High Dose Seasonal PF 02/15/2017, 02/01/2018  ? Influenza,inj,Quad PF,6+ Mos 02/13/2013, 02/25/2014, 03/13/2015, 02/11/2016, 01/19/2021  ? Influenza,inj,quad, With Preservative 02/06/2018  ? Influenza-Unspecified 03/19/2021  ? PFIZER(Purple Top)SARS-COV-2 Vaccination 06/20/2019, 07/13/2019, 05/11/2020  ? Pneumococcal Conjugate-13 03/14/2014  ? Pneumococcal Polysaccharide-23 05/09/2004, 12/05/2017  ? Td 01/07/2005  ?  Zoster, Live 08/08/2006  ? ? ?Past Medical History:  ?Diagnosis Date  ? Allergy   ? Anxiety   ? Asthma   ? Cancer [facility])   ? prostate cancer  ? Cervical spondylosis with radiculopathy   ? left c6 radiculopathy  ? Chronic back pain   ? GERD (gastroesophageal  reflux disease)   ? Hyperlipidemia   ? Hypertension   ? PAF (paroxysmal atrial fibrillation) ([place])   ? Prostate cancer ([place])   ? ? ?Tobacco History: ?Social History  ? ?Tobacco Use  ?Smoking Status Former  ? Packs/day: 0.50  ? Years: 13.00  ? Pack years: 6.50  ? Types: Cigarettes  ? Start date: 49  ? Quit date: 10/23/1968  ? Years since quitting: 52.8  ?Smokeless Tobacco Never  ? ?Counseling given: Not Answered ? ? ?Outpatient Medications Prior to Visit  ?Medication Sig Dispense Refill  ? acetaminophen (TYLENOL) 500 MG tablet Take 500 mg by mouth every 6 (six) hours as needed for moderate pain.    ? albuterol (PROAIR HFA) 108 (90 BASE) MCG/ACT inhaler Inhale 2 puffs into the lungs every 4 (four) hours as needed. 8.5 g 3  ? atorvastatin (LIPITOR) 40 MG tablet TAKE 1 TABLET(40 MG) BY MOUTH DAILY 90 tablet 3  ? benzonatate (TESSALON) 200 MG capsule Take 1 capsule (200 mg total) by mouth 3 (three) times daily as needed for cough. 30 capsule 1  ? budesonide-formoterol (SYMBICORT) 80-4.5 MCG/ACT inhaler Inhale 2 puffs into the lungs 2 (two) times daily. 1 each 5  ? clonazePAM (KLONOPIN) 0.5 MG tablet TAKE 1 TABLET(0.5 MG) BY MOUTH TWICE DAILY AS NEEDED FOR ANXIETY (Patient taking differently: Take 0.5 mg by mouth 2 (two) times daily as needed for anxiety.) 60 tablet 1  ? fluticasone (FLONASE) 50 MCG/ACT nasal spray Use 2 Sprays in each nostril every day at night 16 g 6  ? loratadine (CLARITIN) 10 MG tablet Take 1 tablet (10 mg total) by mouth daily. 30 tablet 5  ? Melatonin 10 MG TABS Take 10 mg by mouth at bedtime as needed (sleep).    ? metoprolol succinate (TOPROL-XL) 25 MG 24 hr tablet Take 1 tablet (25 mg total) by mouth daily. 90 tablet 2  ? Multiple Vitamins-Minerals (PRESERVISION AREDS 2) CAPS Take 1 capsule by mouth 2 (two) times daily.    ? pantoprazole (PROTONIX) 40 MG tablet TAKE 1 TABLET(40 MG) BY MOUTH TWICE DAILY 180 tablet 3  ? tamsulosin (FLOMAX) 0.4 MG CAPS capsule Take 1 capsule (0.4 mg total) by mouth  daily after supper. 90 capsule 3  ? traMADol (ULTRAM) 50 MG tablet Take 50 mg by mouth at bedtime as needed for moderate pain.    ? venlafaxine (EFFEXOR) 25 MG tablet Take 25 mg by mouth 2 (two) times daily.    ? XARELTO 20 MG TABS tablet TAKE ONE TABLET BY MOUTH daily with SUPPER 90 tablet 1  ? predniSONE (DELTASONE) 10 MG tablet 4 tabs for 2 days, then 3 tabs for 2 days, 2 tabs for 2 days, then 1 tab for 2 days, then stop 20 tablet 0  ? ?No facility-administered medications prior to visit.  ? ? ? ?Review of Systems:  ? ?Constitutional: No weight loss or gain, night sweats, fevers, chills, fatigue, or lassitude. ?HEENT: No headaches, difficulty swallowing, tooth/dental problems, or sore throat. No sneezing, itching, ear ache, nasal congestion, or post nasal drip ?CV:  No chest pain, orthopnea, PND, swelling in lower extremities, anasarca, dizziness, palpitations, syncope ?Resp: + Productive  cough, nocturnal wheeze.  No shortness of breath with exertion or at rest. No hemoptysis. No chest wall deformity ?GI:  No heartburn, indigestion, abdominal pain, nausea, vomiting, diarrhea, change in bowel habits, loss of appetite, bloody stools.  ?Skin: No rash, lesions, ulcerations ?MSK:  No joint pain or swelling.  No decreased range of motion.  No back pain. ?Neuro: No dizziness or lightheadedness.  ?Psych: No depression or anxiety. Mood stable.  ? ? ? ?Physical Exam: ? ?BP 108/64 (BP Location: Right Arm, Patient Position: Sitting, Cuff Size: Normal)   Pulse 61   Temp 98.1 ?F (36.7 ?C) (Oral)   Ht '5\' 9"'$  (1.753 m)   Wt 205 lb 6.4 oz (93.2 kg)   SpO2 97%   BMI 30.33 kg/m?  ? ?GEN: Pleasant, interactive, well-appearing; elderly; obese; in no acute distress ?HEENT:  Normocephalic and atraumatic. PERRLA. Sclera white. Nasal turbinates pink, moist and patent bilaterally. No rhinorrhea present. Oropharynx erythematous and moist, without exudate or edema. No lesions, ulcerations ?NECK:  Supple w/ fair ROM. No JVD present.  Normal carotid impulses w/o bruits. Thyroid symmetrical with no goiter or nodules palpated. No lymphadenopathy.   ?CV: RRR, no m/r/g, no peripheral edema. Pulses intact, +2 bilaterally. No cyanosis, pallor or cl

## 2021-09-03 NOTE — Assessment & Plan Note (Addendum)
Slow to resolve asthmatic bronchitis exacerbation. FeNO today borderline.  Does still have some reports of bronchospasm.  Treat with extended prednisone taper and z pack.  CXR today today without superimposed infection.  Discussed that he will need to continue Symbicort twice daily for minimum of 3 months and if at that point he has been stable and not required any prednisone, can consider decreasing to as needed dosing.  Provided with [place] and me paperwork for patient assistance. ? ?Patient Instructions  ?Continue Symbicort 2 puffs Twice daily. Brush tongue and rinse mouth afterwards ?Continue Albuterol inhaler 2 puffs every 6 hours as needed for shortness of breath or wheezing. Notify if symptoms persist despite rescue inhaler/neb use. ?Continue flonase 2 sprays each nostril at bedtime  ?Continue pantoprazole 40 mg daily  ?Continue Claritin 10 mg daily for allergies  ?Continue Mucinex DM Twice daily for chest congestion/cough ?Continue Chlortab over the counter 4 mg at bedtime for cough  ? ?-Prednisone taper. 4 tabs for 3 days, then 3 tabs for 3 days, 2 tabs for 3 days, then 1 tab for 3 days, then stop. Take in AM with food.  ?-Z pack. Take 2 tabs on day one followed by 1 tab daily for four additional days. Take with food.  ?-Flutter valve 2-3 times a day ? ?Chest x ray today  ? ?Follow up in 1 month with Dr. Annamaria Boots or Alanson Aly. If symptoms do not improve or worsen, please contact office for sooner follow up or seek emergency care. ? ? ?

## 2021-09-03 NOTE — Telephone Encounter (Signed)
Called and spoke with patient. He was confused about his flutter device. He was sucking air of it instead of blowing into the device. I advised him on how to use the device and he verbalized understanding.  ? ?Nothing further needed at time of call.  ?

## 2021-09-06 DIAGNOSIS — H353221 Exudative age-related macular degeneration, left eye, with active choroidal neovascularization: Secondary | ICD-10-CM | POA: Diagnosis not present

## 2021-09-22 DIAGNOSIS — C61 Malignant neoplasm of prostate: Secondary | ICD-10-CM | POA: Diagnosis not present

## 2021-09-27 ENCOUNTER — Ambulatory Visit (INDEPENDENT_AMBULATORY_CARE_PROVIDER_SITE_OTHER): Payer: PPO | Admitting: Family Medicine

## 2021-09-27 VITALS — BP 120/72 | HR 89 | Temp 97.9°F | Ht 69.0 in | Wt 203.6 lb

## 2021-09-27 DIAGNOSIS — S39012A Strain of muscle, fascia and tendon of lower back, initial encounter: Secondary | ICD-10-CM

## 2021-09-27 MED ORDER — CYCLOBENZAPRINE HCL 10 MG PO TABS: 10.0000 mg | ORAL_TABLET | Freq: Three times a day (TID) | ORAL | 0 refills | Status: DC | PRN

## 2021-09-27 NOTE — Progress Notes (Signed)
Subjective:    Patient ID: Tyler Allison, male    DOB: 06/05/1940, 81 y.o.   MRN: [id number]  HPI Subjective:   Patient has been doing a lot of yard work.  He admits that he has been laying down heavy pavers lifting.  Over the weekend he developed bilateral flank pain.  The pain is intense.  It hurts to move.  It hurts to lie down.  It hurts to sit up.  Movement definitely exacerbates the pain.  He denies any diarrhea.  He denies any melena or hematochezia.  He denies any constipation.  He denies any nausea or vomiting.  He denies any dysuria urgency or frequency or hematuria.  He denies any fevers or chills.  There is no tenderness to palpation over the ribs.  He denies any cough or pleurisy.  He does have some mild tenderness palpation along the lumbar paraspinal muscles Past Medical History:  Diagnosis Date   Allergy    Anxiety    Asthma    Cancer ([place])    prostate cancer   Cervical spondylosis with radiculopathy    left c6 radiculopathy   Chronic back pain    GERD (gastroesophageal reflux disease)    Hyperlipidemia    Hypertension    PAF (paroxysmal atrial fibrillation) ([facility])    Prostate cancer ([facility])    Past Surgical History:  Procedure Laterality Date   BACK SURGERY     MICROLARYNGOSCOPY W/VOCAL CORD INJECTION N/A 10/30/2020   Procedure: MICRODIRECT LARYNGOSCOPY WITH VOCAL CORD INJECTION AND CO2 LASER APPLICATION;  Surgeon: Melida Quitter, MD;  Location: [facility];  Service: ENT;  Laterality: N/A;   PROSTATE BIOPSY     SPINE SURGERY     back surgery x 3 including lumbar fusion, neck surgery x 2 including C4-6 ACDF   tympanoplasty with mastoidectomy Left 01/13/2004   Current Outpatient Medications on File Prior to Visit  Medication Sig Dispense Refill   acetaminophen (TYLENOL) 500 MG tablet Take 500 mg by mouth every 6 (six) hours as needed for moderate pain.     albuterol (PROAIR HFA) 108 (90 BASE) MCG/ACT inhaler Inhale 2 puffs into the lungs every 4 (four) hours as needed. 8.5  g 3   atorvastatin (LIPITOR) 40 MG tablet TAKE 1 TABLET(40 MG) BY MOUTH DAILY 90 tablet 3   budesonide-formoterol (SYMBICORT) 80-4.5 MCG/ACT inhaler Inhale 2 puffs into the lungs 2 (two) times daily. 1 each 5   clonazePAM (KLONOPIN) 0.5 MG tablet TAKE 1 TABLET(0.5 MG) BY MOUTH TWICE DAILY AS NEEDED FOR ANXIETY (Patient taking differently: Take 0.5 mg by mouth 2 (two) times daily as needed for anxiety.) 60 tablet 1   fluticasone (FLONASE) 50 MCG/ACT nasal spray Use 2 Sprays in each nostril every day at night 16 g 6   loratadine (CLARITIN) 10 MG tablet Take 1 tablet (10 mg total) by mouth daily. 30 tablet 5   Melatonin 10 MG TABS Take 10 mg by mouth at bedtime as needed (sleep).     metoprolol succinate (TOPROL-XL) 25 MG 24 hr tablet Take 1 tablet (25 mg total) by mouth daily. 90 tablet 2   pantoprazole (PROTONIX) 40 MG tablet TAKE 1 TABLET(40 MG) BY MOUTH TWICE DAILY 180 tablet 3   tamsulosin (FLOMAX) 0.4 MG CAPS capsule Take 1 capsule (0.4 mg total) by mouth daily after supper. 90 capsule 3   traMADol (ULTRAM) 50 MG tablet Take 50 mg by mouth at bedtime as needed for moderate pain.     XARELTO 20  MG TABS tablet TAKE ONE TABLET BY MOUTH daily with SUPPER 90 tablet 1   venlafaxine (EFFEXOR) 25 MG tablet Take 25 mg by mouth 2 (two) times daily. (Patient not taking: Reported on 09/27/2021)     No current facility-administered medications on file prior to visit.   Allergies  Allergen Reactions   Codeine Nausea And Vomiting   Social History   Socioeconomic History   Marital status: Married    Spouse name: Not on file   Number of children: 2   Years of education: Not on file   Highest education level: Not on file  Occupational History    Comment: retired  Tobacco Use   Smoking status: Former    Packs/day: 0.50    Years: 13.00    Pack years: 6.50    Types: Cigarettes    Start date: 88    Quit date: 10/23/1968    Years since quitting: 52.9   Smokeless tobacco: Never  Vaping Use    Vaping Use: Never used  Substance and Sexual Activity   Alcohol use: No   Drug use: No   Sexual activity: Not Currently    Comment: married, retired.  Other Topics Concern   Not on file  Social History Narrative   2 children but 1 deceased   Social Determinants of Health   Financial Resource Strain: Low Risk    Difficulty of Paying Living Expenses: Not hard at all  Food Insecurity: No Food Insecurity   Worried About Charity fundraiser in the Last Year: Never true   Arboriculturist in the Last Year: Never true  Transportation Needs: No Transportation Needs   Lack of Transportation (Medical): No   Lack of Transportation (Non-Medical): No  Physical Activity: Insufficiently Active   Days of Exercise per Week: 3 days   Minutes of Exercise per Session: 20 min  Stress: No Stress Concern Present   Feeling of Stress : Not at all  Social Connections: Socially Integrated   Frequency of Communication with Friends and Family: More than three times a week   Frequency of Social Gatherings with Friends and Family: More than three times a week   Attends Religious Services: More than 4 times per year   Active Member of [employer] or Organizations: Yes   Attends Music therapist: More than 4 times per year   Marital Status: Married  Human resources officer Violence: Not At Risk   Fear of Current or Ex-Partner: No   Emotionally Abused: No   Physically Abused: No   Sexually Abused: No   Family History  Problem Relation Age of Onset   Heart failure Mother    Esophageal cancer Father    Heart attack Maternal Uncle    Breast cancer Neg Hx    Prostate cancer Neg Hx    Pancreatic cancer Neg Hx    Colon cancer Neg Hx     Depression Screen  (Note: if answer to either of the following is "Yes", a more complete depression screening is indicated)  Over the past two weeks, have you felt down, depressed or hopeless? No Over the past two weeks, have you felt little interest or pleasure in doing  things? No Have you lost interest or pleasure in daily life? No Do you often feel hopeless? No Do you cry easily over simple problems? No   Activities of Daily Living  In your present state of health, do you have any difficulty performing the following activities?:  Driving?  No  Managing money? No  Feeding yourself? No  Getting from bed to chair? No  Climbing a flight of stairs? No  Preparing food and eating?: No  Bathing or showering? No  Getting dressed: No  Getting to the toilet? No  Using the toilet:No  Moving around from place to place: No  In the past year have you fallen or had a near fall?:No  Are you sexually active? No  Do you have more than one partner? No   Cognitive Testing  Alert? Yes Normal Appearance?Yes  Oriented to person? Yes Place? Yes  Time? Yes  Recall of three objects? Yes   Review of Systems  All other systems reviewed and are negative.     Objective:   Physical Exam Vitals reviewed.  Constitutional:      General: He is not in acute distress.    Appearance: He is well-developed. He is not diaphoretic.  HENT:     Head: Normocephalic and atraumatic.  Neck:     Thyroid: No thyromegaly.     Vascular: No JVD.     Trachea: No tracheal deviation.  Cardiovascular:     Rate and Rhythm: Normal rate and regular rhythm.     Heart sounds: Normal heart sounds. No murmur heard.   No friction rub. No gallop.  Pulmonary:     Effort: Pulmonary effort is normal. No respiratory distress.     Breath sounds: Normal breath sounds. No stridor. No wheezing or rales.  Chest:     Chest wall: No tenderness.  Abdominal:     General: Bowel sounds are normal. There is no distension.     Palpations: Abdomen is soft. There is no mass.     Tenderness: There is no abdominal tenderness. There is no right CVA tenderness, left CVA tenderness, guarding or rebound.  Musculoskeletal:     Lumbar back: Tenderness present. No swelling, deformity, signs of trauma or bony  tenderness. Decreased range of motion.       Back:  Skin:    General: Skin is warm.     Coloration: Skin is not pale.     Findings: No erythema or rash.  Neurological:     Mental Status: He is alert and oriented to person, place, and time.     Cranial Nerves: No cranial nerve deficit.     Motor: No abnormal muscle tone.     Coordination: Coordination normal.     Deep Tendon Reflexes: Reflexes are normal and symmetric.        Assessment & Plan:  Strain of lumbar region, initial encounter I believe the patient has a pulled muscle in his lower back.  Begin Flexeril 10 mg every 8 hours as needed.  He can use a back brace as needed for comfort and support.  Recommended taking a laxative to help keep him regular.  Cautioned the patient about sedation with Flexeril.  If he develops any worsening symptoms such as fever, abdominal pain, dysuria, hematuria, diarrhea, melena or hematochezia he is to call me immediately.  However right now I believe he has pulled a muscle in his lower back

## 2021-09-29 DIAGNOSIS — N401 Enlarged prostate with lower urinary tract symptoms: Secondary | ICD-10-CM | POA: Diagnosis not present

## 2021-09-29 DIAGNOSIS — C61 Malignant neoplasm of prostate: Secondary | ICD-10-CM | POA: Diagnosis not present

## 2021-09-29 DIAGNOSIS — R351 Nocturia: Secondary | ICD-10-CM | POA: Diagnosis not present

## 2021-09-30 ENCOUNTER — Encounter: Payer: Self-pay | Admitting: Physician Assistant

## 2021-09-30 ENCOUNTER — Ambulatory Visit: Payer: PPO | Admitting: Physician Assistant

## 2021-09-30 VITALS — BP 124/70 | HR 71 | Ht 69.0 in | Wt 205.0 lb

## 2021-09-30 DIAGNOSIS — I480 Paroxysmal atrial fibrillation: Secondary | ICD-10-CM | POA: Diagnosis not present

## 2021-09-30 DIAGNOSIS — E782 Mixed hyperlipidemia: Secondary | ICD-10-CM

## 2021-09-30 DIAGNOSIS — R0602 Shortness of breath: Secondary | ICD-10-CM

## 2021-09-30 DIAGNOSIS — I10 Essential (primary) hypertension: Secondary | ICD-10-CM

## 2021-09-30 NOTE — Progress Notes (Signed)
Cardiology Office Note:    Date:  09/30/2021   ID:  Tyler Allison [birth year], MRN [id number]  PCP:  Tyler Frizzle, MD  [facility] Cardiologist:  Tyler Grooms, MD  [facility] Electrophysiologist:  None   Chief Complaint: yearly follow up   History of Present Illness:    Tyler Allison is a 81 y.o. male with a hx of paroxysmal atrial fibrillation, hyperlipidemia, persistent shortness of breath found due to deconditioning, prior smoker and central apnea presents for follow-up.  Patient was found to have irregular heart rate during neck surgery.  He was placed on monitors showing atrial fibrillation.  Had nocturia bradycardia with 2-second pause as well.  Placed on Xarelto for anticoagulation.  Echo 08/28/2018- EF 55-60%, no wall motion abnl or PH PFT 01/15/2019- Minimal obstruction, no resp to BD, Nl lung volumes, Nl Diffusion  Last seen by Dr. Irish Allison 08/2020.  Limited walking due to joint pain.  Felt his shortness of breath due to deconditioning.  Treated with 09/03/2021 steroid and prednisone by pulmonologist for acute asthmatic bronchitis exacerbation.  Patient is here for follow-up.  He reports improved breathing since on Symbicort.  He does yard work without chest pain or significant breathing issue.  No orthopnea, PND, syncope, lower extremity edema or melena.  Patient reports he will be in the donut hole in a few weeks.  Afterwards, Xarelto will cost about $4000 for 57-monthsupply.  Past Medical History:  Diagnosis Date   Allergy    Anxiety    Asthma    Cancer ([facility]    prostate cancer   Cervical spondylosis with radiculopathy    left c6 radiculopathy   Chronic back pain    GERD (gastroesophageal reflux disease)    Hyperlipidemia    Hypertension    PAF (paroxysmal atrial fibrillation) (HCC)    Prostate cancer (HCC)     Past Surgical History:  Procedure Laterality Date   BACK SURGERY     MICROLARYNGOSCOPY W/VOCAL CORD INJECTION N/A 10/30/2020    Procedure: MICRODIRECT LARYNGOSCOPY WITH VOCAL CORD INJECTION AND CO2 LASER APPLICATION;  Surgeon: BMelida Quitter MD;  [facility] [facility]  Service: ENT;  Laterality: N/A;   PROSTATE BIOPSY     SPINE SURGERY     back surgery x 3 including lumbar fusion, neck surgery x 2 including C4-6 ACDF   tympanoplasty with mastoidectomy Left 01/13/2004    Current Medications: Current Meds  Medication Sig   acetaminophen (TYLENOL) 500 MG tablet Take 500 mg by mouth every 6 (six) hours as needed for moderate pain.   albuterol (PROAIR HFA) 108 (90 BASE) MCG/ACT inhaler Inhale 2 puffs into the lungs every 4 (four) hours as needed.   atorvastatin (LIPITOR) 40 MG tablet TAKE 1 TABLET(40 MG) BY MOUTH DAILY   budesonide-formoterol (SYMBICORT) 80-4.5 MCG/ACT inhaler Inhale 2 puffs into the lungs 2 (two) times daily.   clonazePAM (KLONOPIN) 0.5 MG tablet TAKE 1 TABLET(0.5 MG) BY MOUTH TWICE DAILY AS NEEDED FOR ANXIETY   cyclobenzaprine (FLEXERIL) 10 MG tablet Take 1 tablet (10 mg total) by mouth 3 (three) times daily as needed for muscle spasms.   fluticasone (FLONASE) 50 MCG/ACT nasal spray Use 2 Sprays in each nostril every day at night   loratadine (CLARITIN) 10 MG tablet Take 1 tablet (10 mg total) by mouth daily.   Melatonin 10 MG TABS Take 10 mg by mouth at bedtime as needed (sleep).   metoprolol succinate (TOPROL-XL) 25 MG 24 hr tablet Take 1 tablet (25  mg total) by mouth daily.   pantoprazole (PROTONIX) 40 MG tablet TAKE 1 TABLET(40 MG) BY MOUTH TWICE DAILY   tamsulosin (FLOMAX) 0.4 MG CAPS capsule Take 1 capsule (0.4 mg total) by mouth daily after supper.   traMADol (ULTRAM) 50 MG tablet Take 50 mg by mouth at bedtime as needed for moderate pain.   venlafaxine (EFFEXOR) 25 MG tablet Take 25 mg by mouth 2 (two) times daily.   XARELTO 20 MG TABS tablet TAKE ONE TABLET BY MOUTH daily with SUPPER     Allergies:   Codeine   Social History   Socioeconomic History   Marital status: Married    Spouse name:  Not on file   Number of children: 2   Years of education: Not on file   Highest education level: Not on file  Occupational History    Comment: retired  Tobacco Use   Smoking status: Former    Packs/day: 0.50    Years: 13.00    Pack years: 6.50    Types: Cigarettes    Start date: 63    Quit date: 10/23/1968    Years since quitting: 52.9   Smokeless tobacco: Never  Vaping Use   Vaping Use: Never used  Substance and Sexual Activity   Alcohol use: No   Drug use: No   Sexual activity: Not Currently    Comment: married, retired.  Other Topics Concern   Not on file  Social History Narrative   2 children but 1 deceased   Social Determinants of Health   Financial Resource Strain: Low Risk    Difficulty of Paying Living Expenses: Not hard at all  Food Insecurity: No Food Insecurity   Worried About Charity fundraiser in the Last Year: Never true   Arboriculturist in the Last Year: Never true  Transportation Needs: No Transportation Needs   Allison of Transportation (Medical): No   Allison of Transportation (Non-Medical): No  Physical Activity: Insufficiently Active   Days of Exercise per Week: 3 days   Minutes of Exercise per Session: 20 min  Stress: No Stress Concern Present   Feeling of Stress : Not at all  Social Connections: Socially Integrated   Frequency of Communication with Friends and Family: More than three times a week   Frequency of Social Gatherings with Friends and Family: More than three times a week   Attends Religious Services: More than 4 times per year   Active Member of [employer] or Organizations: Yes   Attends Music therapist: More than 4 times per year   Marital Status: Married     Family History: The patient's family history includes Esophageal cancer in his father; Heart attack in his maternal uncle; Heart failure in his mother. There is no history of Breast cancer, Prostate cancer, Pancreatic cancer, or Colon cancer.    ROS:   Please see the  history of present illness.    All other systems reviewed and are negative.   EKGs/Labs/Other Studies Reviewed:    The following studies were reviewed today:  Echo 08/2018 fraction of 55-60%. The cavity size was normal. Left ventricular diastolic  Doppler parameters are indeterminate. No evidence of left ventricular  regional wall motion abnormalities.   2. The right ventricle has normal systolic function. The cavity was  normal. There is no increase in right ventricular wall thickness.   3. The mitral valve is abnormal. There is systolic bowing of the mitral  leaflets, without true prolapse.  4. The aortic valve is tricuspid. Mild thickening of the aortic valve.  Aortic valve regurgitation is trivial by color flow Doppler.   5. The pulmonic valve was grossly normal. Pulmonic valve regurgitation is  mild to moderate is mild by color flow Doppler.   6. The aortic arch is normal in size and structure.   7. There is mild dilatation of the ascending aorta measuring 39 mm.   EKG:  EKG is ordered today.  The ekg ordered today demonstrates NSR  Recent Labs: 03/18/2021: ALT 17; BUN 18; Creat 0.84; Hemoglobin 13.0; Platelets 192; Potassium 4.3; Sodium 139  Recent Lipid Panel    Component Value Date/Time   CHOL 169 03/18/2021 0806   TRIG 98 03/18/2021 0806   HDL 60 03/18/2021 0806   CHOLHDL 2.8 03/18/2021 0806   VLDL 31 (H) 03/13/2015 0816   LDLCALC 90 03/18/2021 0806     Risk Assessment/Calculations:    CHA2DS2-VASc Score = 3   This indicates a 3.2% annual risk of stroke. The patient's score is based upon: CHF History: 0 HTN History: 1 Diabetes History: 0 Stroke History: 0 Vascular Disease History: 0 Age Score: 2 Gender Score: 0      Physical Exam:    VS:  BP 124/70   Pulse 71   Ht '5\' 9"'$  (1.753 m)   Wt 205 lb (93 kg)   SpO2 94%   BMI 30.27 kg/m     Wt Readings from Last 3 Encounters:  09/30/21 205 lb (93 kg)  09/27/21 203 lb 9.6 oz (92.4 kg)  09/03/21 205 lb  6.4 oz (93.2 kg)     GEN:  Well nourished, well developed in no acute distress HEENT: Normal NECK: No JVD; No carotid bruits LYMPHATICS: No lymphadenopathy CARDIAC: RRR, no murmurs, rubs, gallops RESPIRATORY:  Clear to auscultation without rales, wheezing or rhonchi  ABDOMEN: Soft, non-tender, non-distended MUSCULOSKELETAL:  No edema; No deformity  SKIN: Warm and dry NEUROLOGIC:  Alert and oriented x 3 PSYCHIATRIC:  Normal affect   ASSESSMENT AND PLAN:    Paroxysmal atrial fibrillation Maintaining sinus rhythm.  Patient will be in the donut hole and afterwards he was not able to afford Xarelto.  He will apply for assistant program.  If not qualified, other option to switch to warfarin.  Continue beta-blocker.  2.  Hypertension -Blood pressure controlled on beta-blocker.  3.  Hyperlipidemia -Continue statin  4.  Chronic stiffness/asthma -breathing improved since on Symbicort  Medication Adjustments/Labs and Tests Ordered: Current medicines are reviewed at length with the patient today.  Concerns regarding medicines are outlined above.  Orders Placed This Encounter  Procedures   EKG 12-Lead   No orders of the defined types were placed in this encounter.   Patient Instructions  Medication Instructions:  Your physician recommends that you continue on your current medications as directed. Please refer to the Current Medication list given to you today. *If you need a refill on your cardiac medications before your next appointment, please call your pharmacy*   Lab Work: None Ordered If you have labs (blood work) drawn today and your tests are completely normal, you will receive your results only by: [place] (if you have MyChart) OR A paper copy in the mail If you have any lab test that is abnormal or we need to change your treatment, we will call you to review the results.   Testing/Procedures: None Ordered   Follow-Up: At [facility], you and your health  needs are our priority.  As part of our continuing mission to provide you with exceptional heart care, we have created designated Provider Care Teams.  These Care Teams include your primary Cardiologist (physician) and Advanced Practice Providers (APPs -  Physician Assistants and Nurse Practitioners) who all work together to provide you with the care you need, when you need it.  We recommend signing up for the patient portal called "MyChart".  Sign up information is provided on this After Visit Summary.  MyChart is used to connect with patients for Virtual Visits (Telemedicine).  Patients are able to view lab/test results, encounter notes, upcoming appointments, etc.  Non-urgent messages can be sent to your provider as well.   To learn more about what you can do with MyChart, go to [email or web address].    Your next appointment:   12 month(s)  The format for your next appointment:   In Person  Provider:   Larae Grooms, MD     Other Instructions   Important Information About Sugar         Jarrett Soho, [place]  09/30/2021 1:48 PM    [facility]

## 2021-09-30 NOTE — Patient Instructions (Signed)
Medication Instructions:  Your physician recommends that you continue on your current medications as directed. Please refer to the Current Medication list given to you today. *If you need a refill on your cardiac medications before your next appointment, please call your pharmacy*   Lab Work: None Ordered If you have labs (blood work) drawn today and your tests are completely normal, you will receive your results only by: [place] (if you have MyChart) OR A paper copy in the mail If you have any lab test that is abnormal or we need to change your treatment, we will call you to review the results.   Testing/Procedures: None Ordered   Follow-Up: At [facility], you and your health needs are our priority.  As part of our continuing mission to provide you with exceptional heart care, we have created designated Provider Care Teams.  These Care Teams include your primary Cardiologist (physician) and Advanced Practice Providers (APPs -  Physician Assistants and Nurse Practitioners) who all work together to provide you with the care you need, when you need it.  We recommend signing up for the patient portal called "MyChart".  Sign up information is provided on this After Visit Summary.  MyChart is used to connect with patients for Virtual Visits (Telemedicine).  Patients are able to view lab/test results, encounter notes, upcoming appointments, etc.  Non-urgent messages can be sent to your provider as well.   To learn more about what you can do with MyChart, go to [email or web address].    Your next appointment:   12 month(s)  The format for your next appointment:   In Person  Provider:   Larae Grooms, MD     Other Instructions   Important Information About Sugar

## 2021-10-05 ENCOUNTER — Encounter: Payer: Self-pay | Admitting: Nurse Practitioner

## 2021-10-05 ENCOUNTER — Ambulatory Visit: Payer: PPO | Admitting: Nurse Practitioner

## 2021-10-05 DIAGNOSIS — J4530 Mild persistent asthma, uncomplicated: Secondary | ICD-10-CM | POA: Diagnosis not present

## 2021-10-05 DIAGNOSIS — K219 Gastro-esophageal reflux disease without esophagitis: Secondary | ICD-10-CM

## 2021-10-05 DIAGNOSIS — M546 Pain in thoracic spine: Secondary | ICD-10-CM | POA: Diagnosis not present

## 2021-10-05 DIAGNOSIS — J301 Allergic rhinitis due to pollen: Secondary | ICD-10-CM

## 2021-10-05 DIAGNOSIS — K5901 Slow transit constipation: Secondary | ICD-10-CM | POA: Diagnosis not present

## 2021-10-05 NOTE — Assessment & Plan Note (Signed)
Resolved exacerbation. Appears to be well compensated on current regimen. We will continue him on Symbicort Twice daily. May be able to de-escalate to PRN if he is stable at follow up. [employer] paperwork faxed today.   Patient Instructions  Continue Symbicort 2 puffs Twice daily. Brush tongue and rinse mouth afterwards Continue Albuterol inhaler 2 puffs every 6 hours as needed for shortness of breath or wheezing. Notify if symptoms persist despite rescue inhaler/neb use. Continue flonase 2 sprays each nostril at bedtime  Continue pantoprazole 40 mg daily  Continue Claritin 10 mg daily for allergies    Follow up in 3 month with Dr. Annamaria Boots or Alanson Aly. If symptoms do not improve or worsen, please contact office for sooner follow up or seek emergency care.

## 2021-10-05 NOTE — Assessment & Plan Note (Signed)
Improved with Claritin. Continue for trigger prevention and continue flonase for postnasal drainage control.

## 2021-10-05 NOTE — Patient Instructions (Signed)
Continue Symbicort 2 puffs Twice daily. Brush tongue and rinse mouth afterwards Continue Albuterol inhaler 2 puffs every 6 hours as needed for shortness of breath or wheezing. Notify if symptoms persist despite rescue inhaler/neb use. Continue flonase 2 sprays each nostril at bedtime  Continue pantoprazole 40 mg daily  Continue Claritin 10 mg daily for allergies    Follow up in 3 month with Dr. Annamaria Boots or Alanson Aly. If symptoms do not improve or worsen, please contact office for sooner follow up or seek emergency care.

## 2021-10-05 NOTE — Assessment & Plan Note (Signed)
Well controlled on PPI. Continue current regimen.

## 2021-10-05 NOTE — Progress Notes (Signed)
$'@Patient'V$  ID: Tyler Allison, male    DOB: 09/27/1940, 81 y.o.   MRN: [id number]  Chief Complaint  Patient presents with   Follow-up    He is doing well and feels some improvement with his breathing,     Referring provider: [name], MD  HPI: 81 year old male, former smoker followed for asthmatic bronchitis, central sleep apneas.  He is a patient Dr. Janee Morn and last seen in office 08/20/2021 by Belenda Cruise NP.  Past medical history significant for hypertension, hypercholesterol, PAF, lung nodule (2012), cervical spondylosis, dysphagia.  TEST/EVENTS:  01/15/2019 PFTs: Minimal obstruction, no significant response to BD.  Normal lung volumes with normal diffusion capacity 04/19/2020 CTA chest: Aortic atherosclerosis.  The lungs were clear.  No acute or chronic disease is noted. 12/04/2019 HST: AHI 11.8/h, mostly central apneas.  There was a desaturation 85% 08/17/2020 Ono on room air: 12 to 13 minutes spent less than or equal to 88%.  Likely REM associated  02/10/2021: OV with Dr. Annamaria Boots.  Previously seen by Dr. Redmond Baseman with ENT for hoarseness and hemoptysis.  Treated with resection of varix and injection of cords for bowing.  Improvement in voice after this.  Recently completed XRT for prostate cancer.  Reported a morning productive cough otherwise good throughout the day.  DOE had also improved.  Symbicort does not seem to make much of a difference.  Previous ONO with very minimal time spent below 88% (12 to 13 minutes) likely REM associated.  Did not feel like nocturnal oxygen was necessary at this point.  Suspected CSA likely reflects some feedback delay due to cardiovascular disease.  No current symptoms at this time.  Repeat sleep study in future as needed.  Felt well controlled on just occasional use of albuterol.  Plan to watch to resume maintenance as needed.  08/20/2021: OV with Marea Reasner NP.  Worsening cough over the past 2 to 3 weeks with increased shortness of breath and wheezing.  Increased use  of albuterol, using a couple times a day. FeNO 32.  Treated for acute asthmatic bronchitis exacerbation with Depo injection and prednisone taper.  Advised to restart Symbicort. CXR ordered which was never obtained.  Target trigger prevention with Claritin.  Initiated cough control measures as well.   09/03/2021: OV with Dinisha Cai NP for follow-up after being treated for acute asthma flare.  Reports that he feels like his breathing has improved.  He does continue to have a persistent, productive cough with white to gray sputum production.  Feels like his allergy symptoms are well controlled.  He does still notice an occasional wheeze at night.  He has decreased the amount of times he has to use albuterol but still using daily. He continues on Symbicort twice daily. Feels like the co-pay is a little expensive $45 a month - provided with [employer] paperwork.  FeNO borderline and evidence of bronchospasm on exam with bronchitic cough - tx with prednisone taper and z pack for asthmatic bronchitis. Continues to use Claritin and Flonase daily and feels like allergy symptoms are well controlled.  10/05/2021: Today - follow up Patient presents today for follow up after being treated for acute exacerbation of asthmatic bronchitis. He is feeling significantly better. Feels like his breathing is better than it was before now that he has been using Symbicort consistently. He did bring the patient assistance paperwork back with him today for this. No significant cough or wheezing. Allergy symptoms are overall improved as well. He continues on claritin and flonase. Using  Symbicort Twice daily and rare use of albuterol.  Allergies  Allergen Reactions   Codeine Nausea And Vomiting    Immunization History  Administered Date(s) Administered   Fluad Quad(high Dose 65+) 12/28/2018, 01/30/2020   Influenza Whole 02/09/2010   Influenza, High Dose Seasonal PF 02/15/2017, 02/01/2018   Influenza,inj,Quad PF,6+ Mos 02/13/2013,  02/25/2014, 03/13/2015, 02/11/2016, 01/19/2021   Influenza,inj,quad, With Preservative 02/06/2018   Influenza-Unspecified 03/19/2021   PFIZER(Purple Top)SARS-COV-2 Vaccination 06/20/2019, 07/13/2019, 05/11/2020   Pneumococcal Conjugate-13 03/14/2014   Pneumococcal Polysaccharide-23 05/09/2004, 12/05/2017   Td 01/07/2005   Zoster, Live 08/08/2006    Past Medical History:  Diagnosis Date   Allergy    Anxiety    Asthma    Cancer ([place])    prostate cancer   Cervical spondylosis with radiculopathy    left c6 radiculopathy   Chronic back pain    GERD (gastroesophageal reflux disease)    Hyperlipidemia    Hypertension    PAF (paroxysmal atrial fibrillation) (HCC)    Prostate cancer ([employer])     Tobacco History: Social History   Tobacco Use  Smoking Status Former   Packs/day: 0.50   Years: 13.00   Pack years: 6.50   Types: Cigarettes   Start date: 89   Quit date: 10/23/1968   Years since quitting: 52.9  Smokeless Tobacco Never   Counseling given: Not Answered   Outpatient Medications Prior to Visit  Medication Sig Dispense Refill   acetaminophen (TYLENOL) 500 MG tablet Take 500 mg by mouth every 6 (six) hours as needed for moderate pain.     albuterol (PROAIR HFA) 108 (90 BASE) MCG/ACT inhaler Inhale 2 puffs into the lungs every 4 (four) hours as needed. 8.5 g 3   atorvastatin (LIPITOR) 40 MG tablet TAKE 1 TABLET(40 MG) BY MOUTH DAILY 90 tablet 3   budesonide-formoterol (SYMBICORT) 80-4.5 MCG/ACT inhaler Inhale 2 puffs into the lungs 2 (two) times daily. 1 each 5   clonazePAM (KLONOPIN) 0.5 MG tablet TAKE 1 TABLET(0.5 MG) BY MOUTH TWICE DAILY AS NEEDED FOR ANXIETY 60 tablet 1   cyclobenzaprine (FLEXERIL) 10 MG tablet Take 1 tablet (10 mg total) by mouth 3 (three) times daily as needed for muscle spasms. 30 tablet 0   fluticasone (FLONASE) 50 MCG/ACT nasal spray Use 2 Sprays in each nostril every day at night 16 g 6   loratadine (CLARITIN) 10 MG tablet Take 1 tablet (10 mg  total) by mouth daily. 30 tablet 5   Melatonin 10 MG TABS Take 10 mg by mouth at bedtime as needed (sleep).     metoprolol succinate (TOPROL-XL) 25 MG 24 hr tablet Take 1 tablet (25 mg total) by mouth daily. 90 tablet 2   pantoprazole (PROTONIX) 40 MG tablet TAKE 1 TABLET(40 MG) BY MOUTH TWICE DAILY 180 tablet 3   tamsulosin (FLOMAX) 0.4 MG CAPS capsule Take 1 capsule (0.4 mg total) by mouth daily after supper. 90 capsule 3   traMADol (ULTRAM) 50 MG tablet Take 50 mg by mouth at bedtime as needed for moderate pain.     venlafaxine (EFFEXOR) 25 MG tablet Take 25 mg by mouth 2 (two) times daily.     XARELTO 20 MG TABS tablet TAKE ONE TABLET BY MOUTH daily with SUPPER 90 tablet 1   No facility-administered medications prior to visit.     Review of Systems:   Constitutional: No weight loss or gain, night sweats, fevers, chills, fatigue, or lassitude. HEENT: No headaches, difficulty swallowing, tooth/dental problems, or sore throat. No sneezing,  itching, ear ache, nasal congestion, or post nasal drip CV:  No chest pain, orthopnea, PND, swelling in lower extremities, anasarca, dizziness, palpitations, syncope Resp:  No shortness of breath with exertion or at rest. No cough. No hemoptysis. No chest wall deformity GI:  No heartburn, indigestion, abdominal pain, nausea, vomiting, diarrhea, change in bowel habits, loss of appetite, bloody stools.  Skin: No rash, lesions, ulcerations MSK:  No joint pain or swelling.  No decreased range of motion.  No back pain. Neuro: No dizziness or lightheadedness.  Psych: No depression or anxiety. Mood stable.     Physical Exam:  BP 122/76 (BP Location: Left Arm, Cuff Size: Normal)   Pulse 77   Temp 98.4 F (36.9 C) (Oral)   Ht 5' 9.5" (1.765 m)   Wt 209 lb (94.8 kg)   SpO2 97%   BMI 30.42 kg/m   GEN: Pleasant, interactive, well-appearing; elderly; obese; in no acute distress HEENT:  Normocephalic and atraumatic. PERRLA. Sclera white. Nasal turbinates  pink, moist and patent bilaterally. No rhinorrhea present. Oropharynx pink and moist, without exudate or edema. No lesions, ulcerations NECK:  Supple w/ fair ROM. No JVD present. Normal carotid impulses w/o bruits. Thyroid symmetrical with no goiter or nodules palpated. No lymphadenopathy.   CV: RRR, no m/r/g, no peripheral edema. Pulses intact, +2 bilaterally. No cyanosis, pallor or clubbing. PULMONARY:  Unlabored, regular breathing. Clear bilaterally A&P w/o wheezes/rales/rhonchi. No accessory muscle use. No dullness to percussion. GI: BS present and normoactive. Soft, non-tender to palpation. No organomegaly or masses detected. No CVA tenderness. MSK: No erythema, warmth or tenderness. Cap refil <2 sec all extrem. No deformities or joint swelling noted.  Neuro: A/Ox3. No focal deficits noted.   Skin: Warm, no lesions or rashe Psych: Normal affect and behavior. Judgement and thought content appropriate.     Lab Results:  CBC    Component Value Date/Time   WBC 4.3 03/18/2021 0806   RBC 4.18 (L) 03/18/2021 0806   HGB 13.0 (L) 03/18/2021 0806   HGB 15.5 08/28/2018 1404   HCT 39.6 03/18/2021 0806   HCT 44.0 08/28/2018 1404   PLT 192 03/18/2021 0806   PLT 277 08/28/2018 1404   MCV 94.7 03/18/2021 0806   MCV 87 08/28/2018 1404   MCH 31.1 03/18/2021 0806   MCHC 32.8 03/18/2021 0806   RDW 11.9 03/18/2021 0806   RDW 13.2 08/28/2018 1404   LYMPHSABS 675 (L) 03/18/2021 0806   MONOABS 1.0 10/08/2020 0412   EOSABS 108 03/18/2021 0806   BASOSABS 39 03/18/2021 0806    BMET    Component Value Date/Time   NA 139 03/18/2021 0806   NA 138 08/28/2018 1404   K 4.3 03/18/2021 0806   CL 102 03/18/2021 0806   CO2 31 03/18/2021 0806   GLUCOSE 102 (H) 03/18/2021 0806   BUN 18 03/18/2021 0806   BUN 13 08/28/2018 1404   CREATININE 0.84 03/18/2021 0806   CALCIUM 8.9 03/18/2021 0806   GFRNONAA >60 10/08/2020 0412   GFRNONAA 73 03/23/2020 0930   GFRAA 85 03/23/2020 0930    BNP No results  found for: BNP   Imaging:  No results found.  methylPREDNISolone acetate (DEPO-MEDROL) injection 80 mg     Date Action Dose Route User   08/20/2021 1154 Given 80 mg Intramuscular (Right Upper Outer Quadrant) Fran Lowes, CMA          Latest Ref Rng & Units 01/15/2019    3:50 PM  PFT Results  FVC-Pre L 3.39  FVC-Predicted Pre % 83    FVC-Post L 3.43    FVC-Predicted Post % 84    Pre FEV1/FVC % % 72    Post FEV1/FCV % % 75    FEV1-Pre L 2.45    FEV1-Predicted Pre % 84    FEV1-Post L 2.56    DLCO uncorrected ml/min/mmHg 23.65    DLCO UNC% % 96    DLCO corrected ml/min/mmHg 23.85    DLCO COR %Predicted % 97    DLVA Predicted % 111    TLC L 6.20    TLC % Predicted % 87    RV % Predicted % 93      No results found for: NITRICOXIDE      Assessment & Plan:   Asthmatic bronchitis, mild persistent, uncomplicated Resolved exacerbation. Appears to be well compensated on current regimen. We will continue him on Symbicort Twice daily. May be able to de-escalate to PRN if he is stable at follow up. [employer] paperwork faxed today.   Patient Instructions  Continue Symbicort 2 puffs Twice daily. Brush tongue and rinse mouth afterwards Continue Albuterol inhaler 2 puffs every 6 hours as needed for shortness of breath or wheezing. Notify if symptoms persist despite rescue inhaler/neb use. Continue flonase 2 sprays each nostril at bedtime  Continue pantoprazole 40 mg daily  Continue Claritin 10 mg daily for allergies    Follow up in 3 month with Dr. Annamaria Boots or Alanson Aly. If symptoms do not improve or worsen, please contact office for sooner follow up or seek emergency care.    Seasonal allergic rhinitis Improved with Claritin. Continue for trigger prevention and continue flonase for postnasal drainage control.  GERD (gastroesophageal reflux disease) Well controlled on PPI. Continue current regimen.    I spent 25 minutes of dedicated to the care of this patient on the  date of this encounter to include pre-visit review of records, face-to-face time with the patient discussing conditions above, post visit ordering of testing, clinical documentation with the electronic health record, making appropriate referrals as documented, and communicating necessary findings to members of the patients care team.  Clayton Bibles, NP 10/05/2021  Pt aware and understands NP's role.

## 2021-10-07 ENCOUNTER — Ambulatory Visit
Admission: RE | Admit: 2021-10-07 | Discharge: 2021-10-07 | Disposition: A | Discharge status: Home / Self Care | Payer: PPO | Source: Ambulatory Visit | Attending: Family Medicine | Admitting: Family Medicine

## 2021-10-07 ENCOUNTER — Ambulatory Visit (INDEPENDENT_AMBULATORY_CARE_PROVIDER_SITE_OTHER): Payer: PPO | Admitting: Family Medicine

## 2021-10-07 VITALS — BP 122/68 | HR 105 | Temp 98.3°F | Ht 69.0 in | Wt 203.0 lb

## 2021-10-07 DIAGNOSIS — R109 Unspecified abdominal pain: Secondary | ICD-10-CM | POA: Diagnosis not present

## 2021-10-07 DIAGNOSIS — K5900 Constipation, unspecified: Secondary | ICD-10-CM

## 2021-10-07 NOTE — Progress Notes (Signed)
Subjective:    Patient ID: Tyler Allison, male    DOB: 12-05-1940, 81 y.o.   MRN: [id number]  HPI Subjective:   Patient has been doing a lot of yard work.  He admits that he has been laying down heavy pavers lifting.  Over the weekend he developed bilateral flank pain.  The pain is intense.  It hurts to move.  It hurts to lie down.  It hurts to sit up.  Movement definitely exacerbates the pain.  He denies any diarrhea.  He denies any melena or hematochezia.  He denies any constipation.  He denies any nausea or vomiting.  He denies any dysuria urgency or frequency or hematuria.  He denies any fevers or chills.  There is no tenderness to palpation over the ribs.  He denies any cough or pleurisy.  He does have some mild tenderness palpation along the lumbar paraspinal muscles.  At that time, my plan was: I believe the patient has a pulled muscle in his lower back.  Begin Flexeril 10 mg every 8 hours as needed.  He can use a back brace as needed for comfort and support.  Recommended taking a laxative to help keep him regular.  Cautioned the patient about sedation with Flexeril.  If he develops any worsening symptoms such as fever, abdominal pain, dysuria, hematuria, diarrhea, melena or hematochezia he is to call me immediately.  However right now I believe he has pulled a muscle in his lower back  10/07/21 Since I last saw the patient, he states that he is having a difficult time going to bathroom.  He states he typically goes every day.  However he was going 2 days without having a bowel movement.  On Tuesday, he felt so bloated and so full that he spoke with a nurse who recommended taking MiraLAX.  He states that he is going to the bathroom every day now but it is just water and he still feels extremely bloated and full.  He denies any melena or hematochezia.  He denies any abdominal pain however he does report bloating.  He denies any fevers or chills.  The low back pain is gradually improving.  He denies  any lumbar radiculopathy or leg weakness.  He denies any saddle anesthesia. Past Medical History:  Diagnosis Date   Allergy    Anxiety    Asthma    Cancer ([place])    prostate cancer   Cervical spondylosis with radiculopathy    left c6 radiculopathy   Chronic back pain    GERD (gastroesophageal reflux disease)    Hyperlipidemia    Hypertension    PAF (paroxysmal atrial fibrillation) (HCC)    Prostate cancer (HCC)    Past Surgical History:  Procedure Laterality Date   BACK SURGERY     MICROLARYNGOSCOPY W/VOCAL CORD INJECTION N/A 10/30/2020   Procedure: MICRODIRECT LARYNGOSCOPY WITH VOCAL CORD INJECTION AND CO2 LASER APPLICATION;  Surgeon: Melida Quitter, MD;  Location: [facility];  Service: ENT;  Laterality: N/A;   PROSTATE BIOPSY     SPINE SURGERY     back surgery x 3 including lumbar fusion, neck surgery x 2 including C4-6 ACDF   tympanoplasty with mastoidectomy Left 01/13/2004   Current Outpatient Medications on File Prior to Visit  Medication Sig Dispense Refill   acetaminophen (TYLENOL) 500 MG tablet Take 500 mg by mouth every 6 (six) hours as needed for moderate pain.     albuterol (PROAIR HFA) 108 (90 BASE) MCG/ACT inhaler Inhale 2  puffs into the lungs every 4 (four) hours as needed. 8.5 g 3   atorvastatin (LIPITOR) 40 MG tablet TAKE 1 TABLET(40 MG) BY MOUTH DAILY 90 tablet 3   budesonide-formoterol (SYMBICORT) 80-4.5 MCG/ACT inhaler Inhale 2 puffs into the lungs 2 (two) times daily. 1 each 5   clonazePAM (KLONOPIN) 0.5 MG tablet TAKE 1 TABLET(0.5 MG) BY MOUTH TWICE DAILY AS NEEDED FOR ANXIETY 60 tablet 1   cyclobenzaprine (FLEXERIL) 10 MG tablet Take 1 tablet (10 mg total) by mouth 3 (three) times daily as needed for muscle spasms. 30 tablet 0   fluticasone (FLONASE) 50 MCG/ACT nasal spray Use 2 Sprays in each nostril every day at night 16 g 6   loratadine (CLARITIN) 10 MG tablet Take 1 tablet (10 mg total) by mouth daily. 30 tablet 5   Melatonin 10 MG TABS Take 10 mg by mouth at  bedtime as needed (sleep).     metoprolol succinate (TOPROL-XL) 25 MG 24 hr tablet Take 1 tablet (25 mg total) by mouth daily. 90 tablet 2   pantoprazole (PROTONIX) 40 MG tablet TAKE 1 TABLET(40 MG) BY MOUTH TWICE DAILY 180 tablet 3   tamsulosin (FLOMAX) 0.4 MG CAPS capsule Take 1 capsule (0.4 mg total) by mouth daily after supper. 90 capsule 3   traMADol (ULTRAM) 50 MG tablet Take 50 mg by mouth at bedtime as needed for moderate pain.     venlafaxine (EFFEXOR) 25 MG tablet Take 25 mg by mouth 2 (two) times daily.     XARELTO 20 MG TABS tablet TAKE ONE TABLET BY MOUTH daily with SUPPER 90 tablet 1   No current facility-administered medications on file prior to visit.   Allergies  Allergen Reactions   Codeine Nausea And Vomiting   Social History   Socioeconomic History   Marital status: Married    Spouse name: Not on file   Number of children: 2   Years of education: Not on file   Highest education level: Not on file  Occupational History    Comment: retired  Tobacco Use   Smoking status: Former    Packs/day: 0.50    Years: 13.00    Pack years: 6.50    Types: Cigarettes    Start date: 67    Quit date: 10/23/1968    Years since quitting: 52.9   Smokeless tobacco: Never  Vaping Use   Vaping Use: Never used  Substance and Sexual Activity   Alcohol use: No   Drug use: No   Sexual activity: Not Currently    Comment: married, retired.  Other Topics Concern   Not on file  Social History Narrative   2 children but 1 deceased   Social Determinants of Health   Financial Resource Strain: Low Risk    Difficulty of Paying Living Expenses: Not hard at all  Food Insecurity: No Food Insecurity   Worried About Charity fundraiser in the Last Year: Never true   Arboriculturist in the Last Year: Never true  Transportation Needs: No Transportation Needs   Lack of Transportation (Medical): No   Lack of Transportation (Non-Medical): No  Physical Activity: Insufficiently Active    Days of Exercise per Week: 3 days   Minutes of Exercise per Session: 20 min  Stress: No Stress Concern Present   Feeling of Stress : Not at all  Social Connections: Socially Integrated   Frequency of Communication with Friends and Family: More than three times a week   Frequency of  Social Gatherings with Friends and Family: More than three times a week   Attends Religious Services: More than 4 times per year   Active Member of [employer] or Organizations: Yes   Attends Music therapist: More than 4 times per year   Marital Status: Married  Human resources officer Violence: Not At Risk   Fear of Current or Ex-Partner: No   Emotionally Abused: No   Physically Abused: No   Sexually Abused: No   Family History  Problem Relation Age of Onset   Heart failure Mother    Esophageal cancer Father    Heart attack Maternal Uncle    Breast cancer Neg Hx    Prostate cancer Neg Hx    Pancreatic cancer Neg Hx    Colon cancer Neg Hx     Depression Screen  (Note: if answer to either of the following is "Yes", a more complete depression screening is indicated)  Over the past two weeks, have you felt down, depressed or hopeless? No Over the past two weeks, have you felt little interest or pleasure in doing things? No Have you lost interest or pleasure in daily life? No Do you often feel hopeless? No Do you cry easily over simple problems? No   Activities of Daily Living  In your present state of health, do you have any difficulty performing the following activities?:  Driving? No  Managing money? No  Feeding yourself? No  Getting from bed to chair? No  Climbing a flight of stairs? No  Preparing food and eating?: No  Bathing or showering? No  Getting dressed: No  Getting to the toilet? No  Using the toilet:No  Moving around from place to place: No  In the past year have you fallen or had a near fall?:No  Are you sexually active? No  Do you have more than one partner? No   Cognitive  Testing  Alert? Yes Normal Appearance?Yes  Oriented to person? Yes Place? Yes  Time? Yes  Recall of three objects? Yes   Review of Systems  All other systems reviewed and are negative.     Objective:   Physical Exam Vitals reviewed.  Constitutional:      General: He is not in acute distress.    Appearance: He is well-developed. He is not diaphoretic.  HENT:     Head: Normocephalic and atraumatic.  Neck:     Thyroid: No thyromegaly.     Vascular: No JVD.     Trachea: No tracheal deviation.  Cardiovascular:     Rate and Rhythm: Normal rate and regular rhythm.     Heart sounds: Normal heart sounds. No murmur heard.   No friction rub. No gallop.  Pulmonary:     Effort: Pulmonary effort is normal. No respiratory distress.     Breath sounds: Normal breath sounds. No stridor. No wheezing or rales.  Chest:     Chest wall: No tenderness.  Abdominal:     General: Bowel sounds are normal. There is no distension.     Palpations: Abdomen is soft. There is no mass.     Tenderness: There is no abdominal tenderness. There is no right CVA tenderness, left CVA tenderness, guarding or rebound.  Musculoskeletal:     Lumbar back: Tenderness present. No swelling, deformity, signs of trauma or bony tenderness. Decreased range of motion.       Back:  Skin:    General: Skin is warm.     Coloration: Skin is not pale.  Findings: No erythema or rash.  Neurological:     Mental Status: He is alert and oriented to person, place, and time.     Cranial Nerves: No cranial nerve deficit.     Motor: No abnormal muscle tone.     Coordination: Coordination normal.     Deep Tendon Reflexes: Reflexes are normal and symmetric.        Assessment & Plan:  Constipation, unspecified constipation type - Plan: DG Abd 2 Views Patient is taking 3 Dulcolax and has also been taking MiraLAX every day.  He states that he is having copious watery bowel movements but no stool.  Therefore I recommended doing an  x-ray.  If x-ray shows that the patient is extremely constipated with hard stool, we will need to increase the amount of the stool softeners and take a daily stimulant laxative until he is "cleaned out".  However if the x-ray shows no residual stool burden, taking additional stool softeners will not be beneficial.  Instead I would recommend a fiber supplement to promote regularity.

## 2021-10-12 DIAGNOSIS — H353211 Exudative age-related macular degeneration, right eye, with active choroidal neovascularization: Secondary | ICD-10-CM | POA: Diagnosis not present

## 2021-10-12 DIAGNOSIS — Z6828 Body mass index (BMI) 28.0-28.9, adult: Secondary | ICD-10-CM | POA: Diagnosis not present

## 2021-10-12 DIAGNOSIS — M546 Pain in thoracic spine: Secondary | ICD-10-CM | POA: Diagnosis not present

## 2021-10-14 ENCOUNTER — Telehealth: Payer: PPO

## 2021-10-22 ENCOUNTER — Telehealth: Payer: Self-pay

## 2021-10-22 NOTE — Telephone Encounter (Signed)
**Note De-Identified Tyler Allison Obfuscation** The pt left his completed [name] and [name] pt asst application for Xarelto at the office.  I have completed the provider page of his application and have e-mailed all to Dr Hassell Done nurse so she can obtain his signature, date it, and to fax all to [facility] at the fax number written on the cover letter included.

## 2021-10-25 DIAGNOSIS — H353221 Exudative age-related macular degeneration, left eye, with active choroidal neovascularization: Secondary | ICD-10-CM | POA: Diagnosis not present

## 2021-10-26 DIAGNOSIS — B353 Tinea pedis: Secondary | ICD-10-CM | POA: Diagnosis not present

## 2021-10-26 DIAGNOSIS — D485 Neoplasm of uncertain behavior of skin: Secondary | ICD-10-CM | POA: Diagnosis not present

## 2021-10-26 DIAGNOSIS — L570 Actinic keratosis: Secondary | ICD-10-CM | POA: Diagnosis not present

## 2021-10-26 DIAGNOSIS — D225 Melanocytic nevi of trunk: Secondary | ICD-10-CM | POA: Diagnosis not present

## 2021-10-26 DIAGNOSIS — L821 Other seborrheic keratosis: Secondary | ICD-10-CM | POA: Diagnosis not present

## 2021-10-26 DIAGNOSIS — L814 Other melanin hyperpigmentation: Secondary | ICD-10-CM | POA: Diagnosis not present

## 2021-10-26 DIAGNOSIS — L718 Other rosacea: Secondary | ICD-10-CM | POA: Diagnosis not present

## 2021-10-26 DIAGNOSIS — B351 Tinea unguium: Secondary | ICD-10-CM | POA: Diagnosis not present

## 2021-10-26 DIAGNOSIS — C44629 Squamous cell carcinoma of skin of left upper limb, including shoulder: Secondary | ICD-10-CM | POA: Diagnosis not present

## 2021-10-26 NOTE — Telephone Encounter (Signed)
Completed paperwork faxed 

## 2021-11-02 ENCOUNTER — Telehealth: Payer: Self-pay | Admitting: Interventional Cardiology

## 2021-11-02 ENCOUNTER — Telehealth: Payer: Self-pay

## 2021-11-02 NOTE — Telephone Encounter (Signed)
Got another denial on getting Xarelto. Patient wanted to know if any samples can be provided. Patient mentions that he may have to start missing doses due to medication being to expensive. Wants to know if any help can be given

## 2021-11-03 NOTE — Telephone Encounter (Signed)
Call pt to pick up 2 boxes of Xarelto Samples tom. From the front desk. Pt will stop in.

## 2021-11-11 ENCOUNTER — Telehealth: Payer: Self-pay

## 2021-11-11 MED ORDER — APIXABAN 5 MG PO TABS: 5.0000 mg | ORAL_TABLET | Freq: Two times a day (BID) | ORAL | 3 refills | Status: DC

## 2021-11-11 NOTE — Telephone Encounter (Signed)
Pt called today, due to pt  is in the donut hole, pt can't afford Xarelto.However, pt did say that he is approved for medication assistance for Elliquis if you think pt could switch to that.  Please advice?

## 2021-11-15 DIAGNOSIS — L244 Irritant contact dermatitis due to drugs in contact with skin: Secondary | ICD-10-CM | POA: Diagnosis not present

## 2021-11-15 DIAGNOSIS — C44629 Squamous cell carcinoma of skin of left upper limb, including shoulder: Secondary | ICD-10-CM | POA: Diagnosis not present

## 2021-11-15 DIAGNOSIS — L570 Actinic keratosis: Secondary | ICD-10-CM | POA: Diagnosis not present

## 2021-11-23 ENCOUNTER — Telehealth: Payer: Self-pay

## 2021-11-23 NOTE — Telephone Encounter (Signed)
Pt brought in application for prescription assistance to switch meds. Pt needs pcp to fill out pink highlighted areas on form. Please fax form to number provided on form and call pt when available to pick up. Intake form and forms to be completed given to nurse.  Cb#: [phone]

## 2021-11-29 ENCOUNTER — Other Ambulatory Visit: Payer: Self-pay | Admitting: Interventional Cardiology

## 2021-11-29 DIAGNOSIS — H353211 Exudative age-related macular degeneration, right eye, with active choroidal neovascularization: Secondary | ICD-10-CM | POA: Diagnosis not present

## 2021-11-30 ENCOUNTER — Telehealth: Payer: Self-pay | Admitting: Pharmacist

## 2021-11-30 NOTE — Progress Notes (Signed)
Chronic Care Management Pharmacy Assistant   Name: Tyler Allison  MRN: [id number] DOB: [birth year]  Reason for Encounter: Medication Coordination Call    Recent office visits:  10/07/2021 OV (PCP) Tyler Frizzle, MD;  If x-ray shows that the patient is extremely constipated with hard stool, we will need to increase the amount of the stool softeners and take a daily stimulant laxative until he is "cleaned out".  However if the x-ray shows no residual stool burden, taking additional stool softeners will not be beneficial.  Instead I would recommend a fiber supplement to promote regularity.  09/27/2021 OV (PCP) Tyler Frizzle, MD;  Begin Flexeril 10 mg every 8 hours as needed.  He can use a back brace as needed for comfort and support, Recommended taking a laxative to help keep him regular  Recent consult visits:  10/05/2021 OV (Pulmonology) Tyler Bibles, NP; no medication changes indicated.  09/30/2021 OV (Cardiology) Tyler Allison, Tyler Allison, Tyler Allison; no medication changes indicated.  Hospital visits:  None in previous 6 months  Medications: Outpatient Encounter Medications as of 11/30/2021  Medication Sig   acetaminophen (TYLENOL) 500 MG tablet Take 500 mg by mouth every 6 (six) hours as needed for moderate pain.   albuterol (PROAIR HFA) 108 (90 BASE) MCG/ACT inhaler Inhale 2 puffs into the lungs every 4 (four) hours as needed.   apixaban (ELIQUIS) 5 MG TABS tablet Take 1 tablet (5 mg total) by mouth 2 (two) times daily.   atorvastatin (LIPITOR) 40 MG tablet TAKE 1 TABLET(40 MG) BY MOUTH DAILY   budesonide-formoterol (SYMBICORT) 80-4.5 MCG/ACT inhaler Inhale 2 puffs into the lungs 2 (two) times daily.   clonazePAM (KLONOPIN) 0.5 MG tablet TAKE 1 TABLET(0.5 MG) BY MOUTH TWICE DAILY AS NEEDED FOR ANXIETY   cyclobenzaprine (FLEXERIL) 10 MG tablet Take 1 tablet (10 mg total) by mouth 3 (three) times daily as needed for muscle spasms.   fluticasone (FLONASE) 50 MCG/ACT nasal spray Use 2  Sprays in each nostril every day at night   loratadine (CLARITIN) 10 MG tablet Take 1 tablet (10 mg total) by mouth daily.   Melatonin 10 MG TABS Take 10 mg by mouth at bedtime as needed (sleep).   metoprolol succinate (TOPROL-XL) 25 MG 24 hr tablet TAKE ONE TABLET BY MOUTH EVERY EVENING   pantoprazole (PROTONIX) 40 MG tablet TAKE 1 TABLET(40 MG) BY MOUTH TWICE DAILY   traMADol (ULTRAM) 50 MG tablet Take 50 mg by mouth at bedtime as needed for moderate pain.   venlafaxine (EFFEXOR) 25 MG tablet Take 25 mg by mouth 2 (two) times daily.   No facility-administered encounter medications on file as of 11/30/2021.   Reviewed chart for medication changes ahead of medication coordination call.  BP Readings from Last 3 Encounters:  10/07/21 122/68  10/05/21 122/76  09/30/21 124/70    No results found for: "HGBA1C"   Patient obtains medications through Adherence Packaging  90 Days   Last adherence delivery included:  Tamsulosin 0.4 mg 1 at evening meal Metoprolol 25 mg 1 at evening meal Atorvastatin 40 mg 1 at evening meal  Patient declined (meds) last delivery due to: Symbicort - patient states does not work well and is expensive. Loratadine - patient states he has supply on hand Pantoprazole - patient states he has supply on hand  Patient is due for next adherence delivery on: 12/10/2021. Called patient and reviewed medications and coordinated delivery.  This delivery to include: Tamsulosin 0.4 mg 1 at evening meal  Patient  declined the following medications: Metoprolol 25 mg 1 at evening meal Atorvastatin 40 mg - patient states he has supply on hand Pantoprazole - patient states he has supply on hand  Confirmed delivery date of 12/10/2021, advised patient that pharmacy will contact them the morning of delivery.  Care Gaps: Medicare Annual Wellness: Completed 04/19/2021 Hemoglobin A1C: none available Colonoscopy: Aged out  Future Appointments  Date Time Provider [place]   01/20/2022  9:00 AM [facility]-CCM PHARMACIST [facility]  02/10/2022  9:30 AM Deneise Lever, MD [facility] None  02/18/2022  2:00 PM BSFM-NURSE HEALTH ADVISOR [facility]   Tyler Allison, [place] Pharmacist Assistant [phone]

## 2021-12-01 NOTE — Telephone Encounter (Signed)
Spoke with pt, advice him that I have put the forms on pharmacists desk to see if he can help [place] w/his medical assistance. However, our pharmacists has been on vacation and prob won't see it until tom.,12/02/21. I will let him know as soon as I find something out.   Per pt voiced understanding.

## 2021-12-01 NOTE — Telephone Encounter (Signed)
Hi, Durene Romans you by chance know anything about this pt? He is needing assistance with his medication?  Please advice, just checking.

## 2021-12-02 NOTE — Telephone Encounter (Signed)
Forms pending Dr. Dennard Schaumann signature

## 2021-12-06 DIAGNOSIS — H353221 Exudative age-related macular degeneration, left eye, with active choroidal neovascularization: Secondary | ICD-10-CM | POA: Diagnosis not present

## 2021-12-07 DIAGNOSIS — M25512 Pain in left shoulder: Secondary | ICD-10-CM | POA: Diagnosis not present

## 2021-12-15 DIAGNOSIS — C44629 Squamous cell carcinoma of skin of left upper limb, including shoulder: Secondary | ICD-10-CM | POA: Diagnosis not present

## 2022-01-11 DIAGNOSIS — H353221 Exudative age-related macular degeneration, left eye, with active choroidal neovascularization: Secondary | ICD-10-CM | POA: Diagnosis not present

## 2022-01-13 NOTE — Progress Notes (Deleted)
Chronic Care Management Pharmacy Note  01/13/2022 Name:  Tyler Allison MRN:  [id number] DOB:  1940/09/29  Subjective: Tyler Allison is an 81 y.o. year old male who is a primary patient of Pickard, Cammie Mcgee, MD.  The CCM team was consulted for assistance with disease management and care coordination needs.    Engaged with patient by telephone for follow up visit in response to provider referral for pharmacy case management and/or care coordination services.   Consent to Services:  The patient was given the following information about Chronic Care Management services today, agreed to services, and gave verbal consent: 1. CCM service includes personalized support from designated clinical staff supervised by the primary care provider, including individualized plan of care and coordination with other care providers 2. 24/7 contact phone numbers for assistance for urgent and routine care needs. 3. Service will only be billed when office clinical staff spend 20 minutes or more in a month to coordinate care. 4. Only one practitioner may furnish and bill the service in a calendar month. 5.The patient may stop CCM services at any time (effective at the end of the month) by phone call to the office staff. 6. The patient will be responsible for cost sharing (co-pay) of up to 20% of the service fee (after annual deductible is met). Patient agreed to services and consent obtained.  Patient Care Team: [name], MD as PCP - General (Family Medicine) Jettie Booze, MD as PCP - Cardiology (Cardiology) Edythe Clarity, [facility] as Pharmacist (Pharmacist) Cira Rue, RN as Oncology Nurse Navigator Tyler Pita, MD as Consulting Physician (Radiation Oncology) Delice Bison, Charlestine Massed, NP as Nurse Practitioner (Hematology and Oncology) Harmon Pier, RN as Registered Nurse  Recent office visits:  10/07/2021 OV (PCP) [name], MD;  If x-ray shows that the patient is extremely  constipated with hard stool, we will need to increase the amount of the stool softeners and take a daily stimulant laxative until he is "cleaned out".  However if the x-ray shows no residual stool burden, taking additional stool softeners will not be beneficial.  Instead I would recommend a fiber supplement to promote regularity.   09/27/2021 OV (PCP) [name], MD;  Begin Flexeril 10 mg every 8 hours as needed.  He can use a back brace as needed for comfort and support, Recommended taking a laxative to help keep him regular   Recent consult visits:  10/05/2021 OV (Pulmonology) Clayton Bibles, NP; no medication changes indicated.   09/30/2021 OV (Cardiology) [place], [place], [place]; no medication changes indicated.   Hospital visits:  None in previous 6 months  Objective:  Lab Results  Component Value Date   CREATININE 0.84 03/18/2021   BUN 18 03/18/2021   GFR 74.56 07/03/2019   GFRNONAA >60 10/08/2020   GFRAA 85 03/23/2020   NA 139 03/18/2021   K 4.3 03/18/2021   CALCIUM 8.9 03/18/2021   CO2 31 03/18/2021   GLUCOSE 102 (H) 03/18/2021    Lab Results  Component Value Date/Time   GFR 74.56 07/03/2019 12:06 PM    Last diabetic Eye exam: No results found for: "HMDIABEYEEXA"  Last diabetic Foot exam: No results found for: "HMDIABFOOTEX"   Lab Results  Component Value Date   CHOL 169 03/18/2021   HDL 60 03/18/2021   LDLCALC 90 03/18/2021   TRIG 98 03/18/2021   CHOLHDL 2.8 03/18/2021       Latest Ref Rng & Units 03/18/2021  8:06 AM 03/23/2020    9:30 AM 12/03/2019    9:20 AM  Hepatic Function  Total Protein 6.1 - 8.1 g/dL 6.1  6.8  6.7   AST 10 - 35 U/L _0 ALT 9 - 46 U/L _1 Total Bilirubin 0.2 - 1.2 mg/dL 0.6  0.6  1.0     Lab Results  Component Value Date/Time   TSH 2.18 02/11/2016 12:30 PM   TSH 5.442 (H) 03/13/2015 08:16 AM       Latest Ref Rng & Units 03/18/2021    8:06 AM 10/08/2020    4:12 AM 03/23/2020    9:30 AM  CBC   WBC 3.8 - 10.8 Thousand/uL 4.3  7.5  7.2   Hemoglobin 13.2 - 17.1 g/dL 13.0  13.9  14.6   Hematocrit 38.5 - 50.0 % 39.6  40.7  42.8   Platelets 140 - 400 Thousand/uL 192  227  231     No results found for: "VD25OH"  Clinical ASCVD: Yes  The ASCVD Risk score (Arnett DK, et al., 2019) failed to calculate for the following reasons:   The 2019 ASCVD risk score is only valid for ages 85 to 67       04/19/2021    3:04 PM 03/23/2021    2:48 PM 02/11/2021   12:05 PM  Depression screen PHQ 2/9  Decreased Interest 0 0 0  Down, Depressed, Hopeless 0 0 0  PHQ - 2 Score 0 0 0  Altered sleeping 2    Tired, decreased energy 0    Change in appetite 0    Feeling bad or failure about yourself  0    Trouble concentrating 0    Moving slowly or fidgety/restless 0    Suicidal thoughts 0    PHQ-9 Score 2    Difficult doing work/chores Not difficult at all       Social History   Tobacco Use  Smoking Status Former   Packs/day: 0.50   Years: 13.00   Total pack years: 6.50   Types: Cigarettes   Start date: 21   Quit date: 10/23/1968   Years since quitting: 53.2  Smokeless Tobacco Never   BP Readings from Last 3 Encounters:  10/07/21 122/68  10/05/21 122/76  09/30/21 124/70   Pulse Readings from Last 3 Encounters:  10/07/21 (!) 105  10/05/21 77  09/30/21 71   Wt Readings from Last 3 Encounters:  10/07/21 203 lb (92.1 kg)  10/05/21 209 lb (94.8 kg)  09/30/21 205 lb (93 kg)   BMI Readings from Last 3 Encounters:  10/07/21 29.98 kg/m  10/05/21 30.42 kg/m  09/30/21 30.27 kg/m    Assessment/Interventions: Review of patient past medical history, allergies, medications, health status, including review of consultants reports, laboratory and other test data, was performed as part of comprehensive evaluation and provision of chronic care management services.   SDOH:  (Social Determinants of Health) assessments and interventions performed: Yes SDOH Interventions    Flowsheet Row  Clinical Support from 02/11/2021 in [place] Interventions   Food Insecurity Interventions Intervention Not Indicated  Housing Interventions Intervention Not Indicated  Transportation Interventions Intervention Not Indicated  Financial Strain Interventions Intervention Not Indicated  Physical Activity Interventions Intervention Not Indicated  Stress Interventions Intervention Not Indicated  Social Connections Interventions Intervention Not Indicated       Financial Resource Strain: Low Risk  (03/23/2021)   Overall Financial Resource  Strain (CARDIA)    Difficulty of Paying Living Expenses: Not hard at all    [place]: No Food Insecurity (03/23/2021)  Housing: Low Risk  (03/23/2021)  Transportation Needs: No Transportation Needs (03/23/2021)  Alcohol Screen: Low Risk  (03/23/2021)  Depression (PHQ2-9): Low Risk  (04/19/2021)  Financial Resource Strain: Low Risk  (03/23/2021)  Physical Activity: Insufficiently Active (03/23/2021)  Social Connections: Socially Integrated (03/23/2021)  Stress: No Stress Concern Present (03/23/2021)  Tobacco Use: Medium Risk (10/05/2021)    CCM Care Plan  Allergies  Allergen Reactions   Codeine Nausea And Vomiting    Medications Reviewed Today     Reviewed by Colman Cater, [place] (Certified Medical Assistant) on 10/07/21 at 55  Med List Status: <None>   Medication Order Taking? Sig Documenting Provider Last Dose Status Informant  acetaminophen (TYLENOL) 500 MG tablet [id number] Yes Take 500 mg by mouth every 6 (six) hours as needed for moderate pain. [provider] Taking Active Self  albuterol (PROAIR HFA) 108 (90 BASE) MCG/ACT inhaler [id number] Yes Inhale 2 puffs into the lungs every 4 (four) hours as needed. [name], MD Taking Active   atorvastatin (LIPITOR) 40 MG tablet [id number] Yes TAKE 1 TABLET(40 MG) BY MOUTH DAILY [name], MD Taking Active    budesonide-formoterol [facility]) 80-4.5 MCG/ACT inhaler [id number] Yes Inhale 2 puffs into the lungs 2 (two) times daily. Clayton Bibles, NP Taking Active   clonazePAM (KLONOPIN) 0.5 MG tablet [id number] Yes TAKE 1 TABLET(0.5 MG) BY MOUTH TWICE DAILY AS NEEDED FOR ANXIETY [name], MD Taking Active   cyclobenzaprine (FLEXERIL) 10 MG tablet [id number] Yes Take 1 tablet (10 mg total) by mouth 3 (three) times daily as needed for muscle spasms. [name], MD Taking Active   fluticasone [facility]) 50 MCG/ACT nasal spray [id number] Yes Use 2 Sprays in each nostril every day at night [name], MD Taking Active   loratadine (CLARITIN) 10 MG tablet [id number] Yes Take 1 tablet (10 mg total) by mouth daily. Clayton Bibles, NP Taking Active   Melatonin 10 MG TABS [id number] Yes Take 10 mg by mouth at bedtime as needed (sleep). [provider] Taking Active Self  metoprolol succinate (TOPROL-XL) 25 MG 24 hr tablet [id number] Yes Take 1 tablet (25 mg total) by mouth daily. Jettie Booze, MD Taking Active   pantoprazole (PROTONIX) 40 MG tablet [id number] Yes TAKE 1 TABLET(40 MG) BY MOUTH TWICE DAILY [name], MD Taking Active   tamsulosin [facility]) 0.4 MG CAPS capsule [id number] Yes Take 1 capsule (0.4 mg total) by mouth daily after supper. Tyler Pita, MD Taking Active   traMADol [facility] [place]) 50 MG tablet [id number] Yes Take 50 mg by mouth at bedtime as needed for moderate pain. [provider] Taking Active Self           Med Note Barbie Banner, [place]   Thu Sep 30, 2021  1:32 PM)    venlafaxine (EFFEXOR) 25 MG tablet [id number] Yes Take 25 mg by mouth 2 (two) times daily. [provider] Taking Active   XARELTO 20 MG TABS tablet [id number] Yes TAKE ONE TABLET BY MOUTH daily with SUPPER Jettie Booze, MD Taking Active             Patient Active Problem List   Diagnosis Date Noted   Seasonal allergic rhinitis 08/20/2021   Malignant  neoplasm of prostate ([place]) 11/03/2020   Anticoagulant long-term use 10/13/2020   Dysphonia 10/13/2020  Aortic atherosclerosis ([place]) 09/11/2020   Hypertension 09/11/2020   Body mass index (BMI) 29.0-29.9, adult 07/14/2020   Elevated blood-pressure reading, without diagnosis of hypertension 07/14/2020   Neck pain 07/14/2020   Pain in thoracic spine 07/14/2020   Close exposure to COVID-19 virus 06/08/2020   Central sleep apnea 02/07/2020   Nocturnal hypoxemia 03/01/2019   GERD (gastroesophageal reflux disease) 10/24/2018   Paroxysmal atrial fibrillation ([place]) 07/01/2016   Hyperlipidemia 07/01/2016   Pain in left arm 05/10/2016   Cervical spondylosis with radiculopathy    Asthmatic bronchitis, mild persistent, uncomplicated 24/82/5003   Chronic back pain    ADVERSE DRUG REACTION 08/24/2009   Asthma 08/19/2009   PULMONARY NODULE 08/19/2009   COUGH 08/19/2009   PURE HYPERCHOLESTEROLEMIA 04/24/2009    Immunization History  Administered Date(s) Administered   Fluad Quad(high Dose 65+) 12/28/2018, 01/30/2020   Influenza Whole 02/09/2010   Influenza, High Dose Seasonal PF 02/15/2017, 02/01/2018   Influenza,inj,Quad PF,6+ Mos 02/13/2013, 02/25/2014, 03/13/2015, 02/11/2016, 01/19/2021   Influenza,inj,quad, With Preservative 02/06/2018   Influenza-Unspecified 03/19/2021   PFIZER(Purple Top)SARS-COV-2 Vaccination 06/20/2019, 07/13/2019, 05/11/2020   Pneumococcal Conjugate-13 03/14/2014   Pneumococcal Polysaccharide-23 05/09/2004, 12/05/2017   Td 01/07/2005   Zoster, Live 08/08/2006    Conditions to be addressed/monitored:  Afib, HTN, Asthma, GERD, HLD, Back Pain, Aortic Atherosclerosis  There are no care plans that you recently modified to display for this patient.      Medication Assistance: Application for Xarelto  medication assistance program. in process.  Anticipated assistance start date unknown.  See plan of care for additional detail.  Patient's preferred pharmacy  is:  [facility] ([place]) [place], [place] [address] Phone: [phone] Fax: [phone]  [facility] - [place], [place] - [address] [place] [address] Phone: [phone] Fax: [phone]   Uses pill box? Yes Pt endorses 100% compliance  We discussed: Benefits of medication synchronization, packaging and delivery as well as enhanced pharmacist oversight with [facility]. Patient decided to: Continue current medication management strategy  Verbal consent obtained for [facility] enhanced pharmacy services (medication synchronization, adherence packaging, delivery coordination). A medication sync plan was created to allow patient to get all medications delivered once every 30 to 90 days per patient preference. Patient understands they have freedom to choose pharmacy and clinical pharmacist will coordinate care between all prescribers and [facility].  Care Plan and Follow Up Patient Decision:  Patient agrees to Care Plan and Follow-up.  Plan: The care management team will reach out to the patient again over the next 180 days.  [name], PharmD Clinical Pharmacist [facility] [phone]   Current Barriers:  Unable to independently afford treatment regimen  Pharmacist Clinical Goal(s):  Patient will verbalize ability to afford treatment regimen maintain control of blood pressure and cholesterol as evidenced by monitoring and labs  adhere to prescribed medication regimen as evidenced by fill dates contact provider office for questions/concerns as evidenced notation of same in electronic health record through collaboration with PharmD and provider.   Interventions: 1:1 collaboration with [name], MD regarding development and update of comprehensive plan of care as evidenced by provider  attestation and co-signature Inter-disciplinary care team collaboration (see longitudinal plan of care) Comprehensive medication review performed; medication list updated in electronic medical record  Hypertension (BP goal <140/90) -Controlled -Current treatment: Metoprolol XL 5m daily -Medications previously tried: none noted  -Current  home readings: not checking, controlled in office -Current exercise habits: stationary bike 2-3 minutes daily for a few times per day -Denies hypotensive/hypertensive symptoms -Educated on BP goals and benefits of medications for prevention of heart attack, stroke and kidney damage; Exercise goal of 150 minutes per week; Importance of home blood pressure monitoring; Symptoms of hypotension and importance of maintaining adequate hydration; -Counseled to monitor BP at home as able or if symptomatic, document, and provide log at future appointments -Recommended to continue current medication  Update 03/26/21 BP has been controlled Continues exercise as previous. Denies any symptoms of dizziness or HA Continue current medications - monitor BP at home  Hyperlipidemia/Aortic Atherosclerosis: (LDL goal < 70) -Controlled -Current treatment: Atorvastatin 45m daily -Medications previously tried: none noted  -Educated on Cholesterol goals;  Benefits of statin for ASCVD risk reduction; Importance of limiting foods high in cholesterol;  -Reviewed most recent lipid panel with patient, congratulated him on great results -Recommended to continue current medication  Atrial Fibrillation (Goal: prevent stroke and major bleeding) -Controlled -CHADSVASC: 4 (age x2, HTN, CAD). Renal function is normal.  -Current treatment: Rate control: Metoprolol XL 252mdaily Anticoagulation: Xarelto 2028maily -Medications previously tried: none ntoed -Home BP and HR readings: controlled in office -Denies any recent chest pains or irregular heart beat  -Counseled on  increased risk of stroke due to Afib and benefits of anticoagulation for stroke prevention; importance of adherence to anticoagulant exactly as prescribed; bleeding risk associated with Xarelto and importance of self-monitoring for signs/symptoms of bleeding; avoidance of NSAIDs due to increased bleeding risk with anticoagulants; -Recommended to continue current medication Assessed patient finances. He reports Xarelto copay is sometimes an issue.  Income has become much lower this year than in the past.  Will mail JJPAF patient assistance program for patient to complete.  Update 03/26/21 He is now in the donut hole - Xarelto copay is very expensive.  He only needs about 30 days to get him by until the year starts over.  Will request new rx for 30 days to be sent to [facility] so that he can avoid higher copay.  Will try and provide approx 7 days worth of samples to get him to the first of the year.  Patient also elects to use [facility] for packaging and synchronization for refills moving forward.  Will coordinate new refills sent to [facility]. No changes to meds at this time.  Asthma (Goal: control symptoms and prevent exacerbations) -Controlled -Current treatment  Albuterol HFA [id number] -Medications previously tried: Anoro -Exacerbations requiring treatment in last 6 months: none -Patient denies consistent use of maintenance inhaler - does not have one, or need it -Frequency of rescue inhaler use: as needed - very rarely -Counseled on Proper inhaler technique; When to use rescue inhaler -Recommended to continue current medication  Chronic Back Pain (Goal: Control pain) -Controlled -Current treatment  Tramadol 50mg56medications previously tried: Advil - no longer taking d/t Xarelto  -Counseled on avoidance of NSAIDs to control pain with Xarelto therapy -Recommended to continue current medication  GERD (Goal: Minimize symptoms) -Controlled -Current treatment  Pantoprazole 40mg 15m -Medications previously tried: none noted -Takes appropriately - denies any recent symptoms -Working on trigger foods -Recommended to continue current medication   Patient Goals/Self-Care Activities Patient will:  - take medications as prescribed focus on medication adherence by pill box collaborate with provider on medication access solutions target a minimum of 150 minutes of moderate intensity exercise weekly  Follow Up Plan: The care  management team will reach out to the patient again over the next 180 days.

## 2022-01-17 DIAGNOSIS — H35453 Secondary pigmentary degeneration, bilateral: Secondary | ICD-10-CM | POA: Diagnosis not present

## 2022-01-17 DIAGNOSIS — Z961 Presence of intraocular lens: Secondary | ICD-10-CM | POA: Diagnosis not present

## 2022-01-17 DIAGNOSIS — H35363 Drusen (degenerative) of macula, bilateral: Secondary | ICD-10-CM | POA: Diagnosis not present

## 2022-01-17 DIAGNOSIS — H353231 Exudative age-related macular degeneration, bilateral, with active choroidal neovascularization: Secondary | ICD-10-CM | POA: Diagnosis not present

## 2022-01-20 ENCOUNTER — Telehealth: Payer: Self-pay

## 2022-01-24 DIAGNOSIS — H353211 Exudative age-related macular degeneration, right eye, with active choroidal neovascularization: Secondary | ICD-10-CM | POA: Diagnosis not present

## 2022-02-01 DIAGNOSIS — L821 Other seborrheic keratosis: Secondary | ICD-10-CM | POA: Diagnosis not present

## 2022-02-01 DIAGNOSIS — Z09 Encounter for follow-up examination after completed treatment for conditions other than malignant neoplasm: Secondary | ICD-10-CM | POA: Diagnosis not present

## 2022-02-01 DIAGNOSIS — L570 Actinic keratosis: Secondary | ICD-10-CM | POA: Diagnosis not present

## 2022-02-01 DIAGNOSIS — D485 Neoplasm of uncertain behavior of skin: Secondary | ICD-10-CM | POA: Diagnosis not present

## 2022-02-01 DIAGNOSIS — Z08 Encounter for follow-up examination after completed treatment for malignant neoplasm: Secondary | ICD-10-CM | POA: Diagnosis not present

## 2022-02-01 DIAGNOSIS — L538 Other specified erythematous conditions: Secondary | ICD-10-CM | POA: Diagnosis not present

## 2022-02-01 DIAGNOSIS — Z85828 Personal history of other malignant neoplasm of skin: Secondary | ICD-10-CM | POA: Diagnosis not present

## 2022-02-01 DIAGNOSIS — L814 Other melanin hyperpigmentation: Secondary | ICD-10-CM | POA: Diagnosis not present

## 2022-02-10 ENCOUNTER — Ambulatory Visit: Payer: PPO | Admitting: Internal Medicine

## 2022-02-18 ENCOUNTER — Ambulatory Visit (INDEPENDENT_AMBULATORY_CARE_PROVIDER_SITE_OTHER): Payer: PPO

## 2022-02-18 DIAGNOSIS — Z0000 Encounter for general adult medical examination without abnormal findings: Secondary | ICD-10-CM

## 2022-02-18 NOTE — Progress Notes (Signed)
Subjective:   Tyler Allison is a 81 y.o. male who presents for Medicare Annual/Subsequent preventive examination.  Review of Systems    SOB Asthmatic  Cardiac Risk Factors include: advanced age (>42mn, >>52women);family history of premature cardiovascular disease;male gender;Other (see comment)     Objective:    Today's Vitals   02/18/22 1409  PainSc: 5    There is no height or weight on file to calculate BMI.     02/18/2022    2:25 PM 02/16/2021    1:26 PM 02/11/2021   12:13 PM 10/08/2020    6:05 AM 08/31/2020   10:27 AM 08/05/2020    9:33 AM 03/23/2020    9:15 AM  Advanced Directives  Does Patient Have a Medical Advance Directive? Yes Yes Yes No No Yes Yes  Type of Advance Directive Living will Living will;Healthcare Power of [place] will   Healthcare Power of [place] Club[place] of facility DNR (pink MOST or yellow form);Living will  Does patient want to make changes to medical advance directive? Yes (Inpatient - patient requests chaplain consult to change a medical advance directive) No - Patient declined    No - Patient declined   Copy of [place] Chart?   No - copy requested   No - copy requested   Would patient like information on creating a medical advance directive?   No - Patient declined No - Patient declined No - Patient declined      Current Medications (verified) Outpatient Encounter Medications as of 02/18/2022  Medication Sig   acetaminophen (TYLENOL) 500 MG tablet Take 500 mg by mouth every 6 (six) hours as needed for moderate pain.   albuterol (PROAIR HFA) 108 (90 BASE) MCG/ACT inhaler Inhale 2 puffs into the lungs every 4 (four) hours as needed.   apixaban (ELIQUIS) 5 MG TABS tablet Take 1 tablet (5 mg total) by mouth 2 (two) times daily.   atorvastatin (LIPITOR) 40 MG tablet TAKE 1 TABLET(40 MG) BY MOUTH DAILY   clonazePAM (KLONOPIN) 0.5 MG tablet TAKE 1 TABLET(0.5 MG) BY  MOUTH TWICE DAILY AS NEEDED FOR ANXIETY   cyclobenzaprine (FLEXERIL) 10 MG tablet Take 1 tablet (10 mg total) by mouth 3 (three) times daily as needed for muscle spasms.   fluticasone (FLONASE) 50 MCG/ACT nasal spray Use 2 Sprays in each nostril every day at night   loratadine (CLARITIN) 10 MG tablet Take 1 tablet (10 mg total) by mouth daily.   Melatonin 10 MG TABS Take 10 mg by mouth at bedtime as needed (sleep).   metoprolol succinate (TOPROL-XL) 25 MG 24 hr tablet TAKE ONE TABLET BY MOUTH EVERY EVENING   pantoprazole (PROTONIX) 40 MG tablet TAKE 1 TABLET(40 MG) BY MOUTH TWICE DAILY   traMADol (ULTRAM) 50 MG tablet Take 50 mg by mouth at bedtime as needed for moderate pain.   budesonide-formoterol (SYMBICORT) 80-4.5 MCG/ACT inhaler Inhale 2 puffs into the lungs 2 (two) times daily. (Patient not taking: Reported on 02/18/2022)   venlafaxine (EFFEXOR) 25 MG tablet Take 25 mg by mouth 2 (two) times daily. (Patient not taking: Reported on 02/18/2022)   No facility-administered encounter medications on file as of 02/18/2022.    Allergies (verified) Codeine   History: Past Medical History:  Diagnosis Date   Allergy    Anxiety    Asthma    [place]    prostate cancer   Cervical spondylosis with radiculopathy    left c6  radiculopathy   Chronic back pain    GERD (gastroesophageal reflux disease)    Hyperlipidemia    Hypertension    PAF (paroxysmal atrial fibrillation) (HCC)    Prostate cancer (HCC)    Past Surgical History:  Procedure Laterality Date   BACK SURGERY     MICROLARYNGOSCOPY W/VOCAL CORD INJECTION N/A 10/30/2020   Procedure: MICRODIRECT LARYNGOSCOPY WITH VOCAL CORD INJECTION AND CO2 LASER APPLICATION;  Surgeon: Melida Quitter, MD;  Location: [facility];  Service: ENT;  Laterality: N/A;   [place]     back surgery x 3 including lumbar fusion, neck surgery x 2 including C4-6 ACDF   tympanoplasty with mastoidectomy Left 01/13/2004   Family History   Problem Relation Age of Onset   Heart failure Mother    Esophageal cancer Father    Heart attack Maternal Uncle    Breast cancer Neg Hx    Prostate cancer Neg Hx    Pancreatic cancer Neg Hx    Colon cancer Neg Hx    Social History   Socioeconomic History   Marital status: Married    Spouse name: Not on file   Number of children: 2   Years of education: Not on file   Highest education level: Not on file  Occupational History    Comment: retired  Tobacco Use   Smoking status: Former    Packs/day: 0.50    Years: 13.00    Total pack years: 6.50    Types: Cigarettes    Start date: 9    Quit date: 10/23/1968    Years since quitting: 53.3   Smokeless tobacco: Never  Vaping Use   Vaping Use: Never used  Substance and Sexual Activity   Alcohol use: No   Drug use: No   Sexual activity: Not Currently    Comment: married, retired.  Other Topics Concern   Not on file  Social History Narrative   2 children but 1 deceased   Social Determinants of Health   Financial Resource Strain: Low Risk  (02/18/2022)   Overall Financial Resource Strain (CARDIA)    Difficulty of Paying Living Expenses: Not very hard  Food Insecurity: No Food Insecurity (02/18/2022)   Hunger Vital Sign    Worried About Running Out of Food in the Last Year: Never true    Ran Out of Food in the Last Year: Never true  Transportation Needs: No Transportation Needs (02/18/2022)   PRAPARE - Hydrologist (Medical): No    Lack of Transportation (Non-Medical): No  Physical Activity: Insufficiently Active (02/18/2022)   Exercise Vital Sign    Days of Exercise per Week: 1 day    Minutes of Exercise per Session: 20 min  Stress: No Stress Concern Present (02/18/2022)   [place]    Feeling of Stress : Not at all  Social Connections: [place] (02/18/2022)   Social Connection and Isolation Panel [NHANES]     Frequency of Communication with Friends and Family: Three times a week    Frequency of Social Gatherings with Friends and Family: Three times a week    Attends Religious Services: 1 to 4 times per year    Active Member of Clubs or Organizations: No    Attends Archivist Meetings: 1 to 4 times per year    Marital Status: Married    Tobacco Counseling Counseling given: Not Answered   Clinical Intake:  Pre-visit preparation completed: Yes  Pain : 0-10 Pain Score: 5  Pain Type: Acute pain Pain Location: Shoulder Pain Orientation: Right Pain Descriptors / Indicators: Aching Pain Onset: 1 to 4 weeks ago Pain Frequency: Other (Comment) (been having this paing for a few years) Effect of Pain on Daily Activities: no not really     BMI - recorded: 29.6 Nutritional Status: BMI 25 -29 Overweight Nutritional Risks: None Diabetes: No  How often do you need to have someone help you when you read instructions, pamphlets, or other written materials from your doctor or pharmacy?: 1 - Never What is the last grade level you completed in school?: 12  Diabetic?no  Interpreter Needed?: No      Activities of Daily Living    02/18/2022    2:28 PM  In your present state of health, do you have any difficulty performing the following activities:  Hearing? 0  Comment a little in l-ear  Vision? 1  Difficulty concentrating or making decisions? 0  Walking or climbing stairs? 1  Dressing or bathing? 0  Doing errands, shopping? 0  Preparing Food and eating ? Y  Using the Toilet? Y  In the past six months, have you accidently leaked urine? N  Do you have problems with loss of bowel control? N  Managing your Medications? Y  Managing your Finances? Y  Housekeeping or managing your Housekeeping? Y    Patient Care Team: [name], MD as PCP - General (Family Medicine) Jettie Booze, MD as PCP - Cardiology (Cardiology) Edythe Clarity, [facility] as Pharmacist  (Pharmacist) Cira Rue, RN as Oncology Nurse Navigator Tyler Pita, MD as Consulting Physician (Radiation Oncology) Delice Bison Charlestine Massed, NP as Nurse Practitioner (Hematology and Oncology) Harmon Pier, RN as Registered Nurse  Indicate any recent Medical Services you may have received from other than [facility] providers in the past year (date may be approximate).     Assessment:   This is a routine wellness examination for Ehsan.  Hearing/Vision screen No results found.  Dietary issues and exercise activities discussed: Current Exercise Habits: Home exercise routine, Type of exercise: walking (from the house to the mailbox rught 115 ft), Time (Minutes): 10, Frequency (Times/Week): 5, Weekly Exercise (Minutes/Week): 50, Intensity: Mild, Exercise limited by: cardiac condition(s);orthopedic condition(s);respiratory conditions(s)   Goals Addressed   None    Depression Screen    02/18/2022    2:22 PM 04/19/2021    3:04 PM 03/23/2021    2:48 PM 02/11/2021   12:05 PM 02/11/2021   12:04 PM 03/23/2020    9:15 AM 02/01/2018   10:55 AM  PHQ 2/9 Scores  PHQ - 2 Score 1 0 0 0 0 0 0  PHQ- 9 Score  2         Fall Risk    02/18/2022    2:27 PM 04/19/2021    3:03 PM 02/11/2021   12:14 PM 03/23/2020    9:15 AM 04/01/2019    9:12 AM  [place] in the past year? 0 0 0 0 0  Comment     Emmi Telephone Survey: data to providers prior to load  Number falls in past yr: 0 0 0 0   Injury with Fall? 0 0 0 0   Risk for fall due to :   Impaired vision    Follow up   Falls prevention discussed      FALL RISK PREVENTION PERTAINING TO THE HOME:  Any stairs in or  around the home? No  If so, are there any without handrails? No  Home free of loose throw rugs in walkways, pet beds, electrical cords, etc? No  Adequate lighting in your home to reduce risk of falls? No   ASSISTIVE DEVICES UTILIZED TO PREVENT FALLS:  Life alert? No  Use of a cane, walker or w/c? No  Grab bars in the  bathroom? No  Shower chair or bench in shower? No  Elevated toilet seat or a handicapped toilet? No   TIMED UP AND GO:  Was the test performed? No .  Length of time to ambulate 10 feet: phone visit  sec.     Cognitive Function:        02/18/2022    2:32 PM 02/11/2021   12:16 PM 03/23/2020    9:16 AM  6CIT Screen  What Year? 0 points 0 points 0 points  What month? 0 points 0 points 0 points  What time? 0 points 0 points 0 points  Count back from 20 0 points 0 points 0 points  Months in reverse 0 points 0 points 0 points  Repeat phrase 0 points 0 points 0 points  Total Score 0 points 0 points 0 points    Immunizations Immunization History  Administered Date(s) Administered   Fluad Quad(high Dose 65+) 12/28/2018, 01/30/2020   Influenza Whole 02/09/2010   Influenza, High Dose Seasonal PF 02/15/2017, 02/01/2018   Influenza,inj,Quad PF,6+ Mos 02/13/2013, 02/25/2014, 03/13/2015, 02/11/2016, 01/19/2021   Influenza,inj,quad, With Preservative 02/06/2018   Influenza-Unspecified 03/19/2021   PFIZER(Purple Top)SARS-COV-2 Vaccination 06/20/2019, 07/13/2019, 05/11/2020   Pneumococcal Conjugate-13 03/14/2014   Pneumococcal Polysaccharide-23 05/09/2004, 12/05/2017   Td 01/07/2005   Zoster, Live 08/08/2006    TDAP status: Due, Education has been provided regarding the importance of this vaccine. Advised may receive this vaccine at local pharmacy or Health Dept. Aware to provide a copy of the vaccination record if obtained from local pharmacy or Health Dept. Verbalized acceptance and understanding.  Flu Vaccine status: Due, Education has been provided regarding the importance of this vaccine. Advised may receive this vaccine at local pharmacy or Health Dept. Aware to provide a copy of the vaccination record if obtained from local pharmacy or Health Dept. Verbalized acceptance and understanding.  Pneumococcal vaccine status: Up to date  Covid-19 vaccine status: Declined, Education has  been provided regarding the importance of this vaccine but patient still declined. Advised may receive this vaccine at local pharmacy or Health Dept.or vaccine clinic. Aware to provide a copy of the vaccination record if obtained from local pharmacy or Health Dept. Verbalized acceptance and understanding.  Qualifies for Shingles Vaccine? Yes   Zostavax completed Yes   Shingrix Completed?: Yes  Screening Tests Health Maintenance  Topic Date Due   TETANUS/TDAP  01/08/2015   COVID-19 Vaccine (4 - Pfizer risk series) 07/06/2020   INFLUENZA VACCINE  12/07/2021   Zoster Vaccines- Shingrix (1 of 2) 05/21/2022 (Originally 01/03/1960)   Pneumonia Vaccine 30+ Years old  Completed   HPV VACCINES  Aged Out    Health Maintenance  Health Maintenance Due  Topic Date Due   TETANUS/TDAP  01/08/2015   COVID-19 Vaccine (4 - Pfizer risk series) 07/06/2020   INFLUENZA VACCINE  12/07/2021    Colorectal cancer screening: No longer required.   Lung Cancer Screening: (Low Dose CT Chest recommended if Age 87-80 years, 30 pack-year currently smoking OR have quit w/in 15years.) does not qualify.   Lung Cancer Screening Referral: n/s  Additional Screening:  Hepatitis  C Screening: does not qualify; Completed n/a  Vision Screening: Recommended annual ophthalmology exams for early detection of glaucoma and other disorders of the eye. Is the patient up to date with their annual eye exam?  Yes  Who is the provider or what is the name of the office in which the patient attends annual eye exams?Dr. Posey Pronto in [place] If pt is not established with a provider, would they like to be referred to a provider to establish care? No .   Dental Screening: Recommended annual dental exams for proper oral hygiene  Community Resource Referral / Chronic Care Management: CRR required this visit?  No   CCM required this visit?  No      Plan:     I have personally reviewed and noted the following in the patient's  chart:   Medical and social history Use of alcohol, tobacco or illicit drugs  Current medications and supplements including opioid prescriptions. Patient is currently taking opioid prescriptions. Information provided to patient regarding non-opioid alternatives. Patient advised to discuss non-opioid treatment plan with their provider. Functional ability and status Nutritional status Physical activity Advanced directives List of other physicians Hospitalizations, surgeries, and ER visits in previous 12 months Vitals Screenings to include cognitive, depression, and falls Referrals and appointments  In addition, I have reviewed and discussed with patient certain preventive protocols, quality metrics, and best practice recommendations. A written personalized care plan for preventive services as well as general preventive health recommendations were provided to patient.     Colman Cater, [place]   02/18/2022   Nurse Notes: n/a

## 2022-02-22 DIAGNOSIS — H353221 Exudative age-related macular degeneration, left eye, with active choroidal neovascularization: Secondary | ICD-10-CM | POA: Diagnosis not present

## 2022-03-01 ENCOUNTER — Other Ambulatory Visit: Payer: Self-pay

## 2022-03-01 ENCOUNTER — Telehealth: Payer: Self-pay | Admitting: Pharmacist

## 2022-03-01 NOTE — Telephone Encounter (Signed)
-----   Message from April D Calhoun sent at 03/01/2022  1:24 PM EDT ----- Regarding: Rx refill request Patient has an upcoming delivery with [facility]. He is requesting Tramadol to be added to his delivery. He will need refills on this medication.  Please send refills to [facility].  Thank you,  April D Calhoun, [place] Pharmacist Assistant [phone]

## 2022-03-01 NOTE — Progress Notes (Signed)
    Chronic Care Management Pharmacy Assistant   Name: REYN FAIVRE  MRN: [id number] DOB: 06-01-1940   Reason for Encounter: Medication Coordination Call    Recent office visits:  None  Recent consult visits:  None  Hospital visits:  None in previous 6 months  Medications: Outpatient Encounter Medications as of 03/01/2022  Medication Sig   acetaminophen (TYLENOL) 500 MG tablet Take 500 mg by mouth every 6 (six) hours as needed for moderate pain.   albuterol (PROAIR HFA) 108 (90 BASE) MCG/ACT inhaler Inhale 2 puffs into the lungs every 4 (four) hours as needed.   apixaban (ELIQUIS) 5 MG TABS tablet Take 1 tablet (5 mg total) by mouth 2 (two) times daily.   atorvastatin (LIPITOR) 40 MG tablet TAKE 1 TABLET(40 MG) BY MOUTH DAILY   budesonide-formoterol (SYMBICORT) 80-4.5 MCG/ACT inhaler Inhale 2 puffs into the lungs 2 (two) times daily. (Patient not taking: Reported on 02/18/2022)   clonazePAM (KLONOPIN) 0.5 MG tablet TAKE 1 TABLET(0.5 MG) BY MOUTH TWICE DAILY AS NEEDED FOR ANXIETY   cyclobenzaprine (FLEXERIL) 10 MG tablet Take 1 tablet (10 mg total) by mouth 3 (three) times daily as needed for muscle spasms.   fluticasone (FLONASE) 50 MCG/ACT nasal spray Use 2 Sprays in each nostril every day at night   loratadine (CLARITIN) 10 MG tablet Take 1 tablet (10 mg total) by mouth daily.   Melatonin 10 MG TABS Take 10 mg by mouth at bedtime as needed (sleep).   metoprolol succinate (TOPROL-XL) 25 MG 24 hr tablet TAKE ONE TABLET BY MOUTH EVERY EVENING   pantoprazole (PROTONIX) 40 MG tablet TAKE 1 TABLET(40 MG) BY MOUTH TWICE DAILY   traMADol (ULTRAM) 50 MG tablet Take 50 mg by mouth at bedtime as needed for moderate pain.   venlafaxine (EFFEXOR) 25 MG tablet Take 25 mg by mouth 2 (two) times daily. (Patient not taking: Reported on 02/18/2022)   No facility-administered encounter medications on file as of 03/01/2022.   Reviewed chart for medication changes ahead of medication  coordination call.  No OVs, Consults, or hospital visits since last care coordination call/Pharmacist visit.  No medication changes indicated.  BP Readings from Last 3 Encounters:  10/07/21 122/68  10/05/21 122/76  09/30/21 124/70    No results found for: "HGBA1C"   Patient obtains medications through Vials  90 Days   Last adherence delivery included: Tamsulosin 0.4 mg 1 at evening meal  Patient is due for next adherence delivery on: 03/10/2022. Called patient and reviewed medications and coordinated delivery.  This delivery to include: Tamsulosin 0.4 mg 1 at evening meal Metoprolol 25 mg 1 at evening meal Atorvastatin 40 mg 1 at evening meal Pantoprazole 40 mg 1 at breakfast, 1 at evening meal Tramadol 50 mg every 8 hours as needed Fluticasone nasal spray  Patient needs refills for Tramadol. -Rx refill request sent to [facility].  Confirmed delivery date of 03/10/2022, advised patient that pharmacy will contact them the morning of delivery.   Care Gaps: Medicare Annual Wellness: Completed 04/19/2021 Hemoglobin A1C: none available Colonoscopy: Aged out  No future appointments.  April D Calhoun, [place] Pharmacist Assistant [phone]

## 2022-03-02 NOTE — Telephone Encounter (Signed)
Requested medication (s) are due for refill today: historical medication  Requested medication (s) are on the active medication list: yes  Last refill:  na  Future visit scheduled: no last seen 4 months ago  Notes to clinic:   historical medication. Not delegated per protocol. Requesting Rx to be sent in due to mail order going to deliver medications soon. Do you want to order Rx?     Requested Prescriptions  Pending Prescriptions Disp Refills   traMADol (ULTRAM) 50 MG tablet 30 tablet     Sig: Take 1 tablet (50 mg total) by mouth at bedtime as needed for moderate pain.     Not Delegated - Analgesics:  Opioid Agonists Failed - 03/01/2022  4:46 PM      Failed - This refill cannot be delegated      Failed - Urine Drug Screen completed in last 360 days      Failed - Valid encounter within last 3 months    Recent Outpatient Visits           4 months ago Constipation, unspecified constipation type   [place] [name], MD   5 months ago Strain of lumbar region, initial encounter   [place] Pickard, Cammie Mcgee, MD   10 months ago Encounter for [employer] annual wellness exam   [place] Pickard, Cammie Mcgee, MD   1 year ago Viral URI   [place] Dennard Schaumann, Cammie Mcgee, MD   1 year ago Insect bite of left knee, initial encounter   [place] [name], NP

## 2022-03-03 ENCOUNTER — Ambulatory Visit (INDEPENDENT_AMBULATORY_CARE_PROVIDER_SITE_OTHER): Payer: PPO

## 2022-03-03 DIAGNOSIS — Z23 Encounter for immunization: Secondary | ICD-10-CM

## 2022-03-03 MED ORDER — TRAMADOL HCL 50 MG PO TABS: 50.0000 mg | ORAL_TABLET | Freq: Every evening | ORAL | 0 refills | Status: DC | PRN

## 2022-03-09 DIAGNOSIS — H353211 Exudative age-related macular degeneration, right eye, with active choroidal neovascularization: Secondary | ICD-10-CM | POA: Diagnosis not present

## 2022-04-05 DIAGNOSIS — H353221 Exudative age-related macular degeneration, left eye, with active choroidal neovascularization: Secondary | ICD-10-CM | POA: Diagnosis not present

## 2022-04-20 DIAGNOSIS — M25512 Pain in left shoulder: Secondary | ICD-10-CM | POA: Diagnosis not present

## 2022-04-28 DIAGNOSIS — H353211 Exudative age-related macular degeneration, right eye, with active choroidal neovascularization: Secondary | ICD-10-CM | POA: Diagnosis not present

## 2022-05-13 DIAGNOSIS — C61 Malignant neoplasm of prostate: Secondary | ICD-10-CM | POA: Diagnosis not present

## 2022-05-17 DIAGNOSIS — H353221 Exudative age-related macular degeneration, left eye, with active choroidal neovascularization: Secondary | ICD-10-CM | POA: Diagnosis not present

## 2022-05-20 DIAGNOSIS — C61 Malignant neoplasm of prostate: Secondary | ICD-10-CM | POA: Diagnosis not present

## 2022-05-20 DIAGNOSIS — R351 Nocturia: Secondary | ICD-10-CM | POA: Diagnosis not present

## 2022-05-20 DIAGNOSIS — N401 Enlarged prostate with lower urinary tract symptoms: Secondary | ICD-10-CM | POA: Diagnosis not present

## 2022-05-24 ENCOUNTER — Encounter: Payer: Self-pay | Admitting: Family Medicine

## 2022-05-24 ENCOUNTER — Ambulatory Visit (INDEPENDENT_AMBULATORY_CARE_PROVIDER_SITE_OTHER): Payer: PPO | Admitting: Family Medicine

## 2022-05-24 VITALS — BP 124/68 | HR 103 | Temp 98.2°F | Ht 69.0 in | Wt 206.0 lb

## 2022-05-24 DIAGNOSIS — B349 Viral infection, unspecified: Secondary | ICD-10-CM | POA: Diagnosis not present

## 2022-05-24 LAB — INFLUENZA A AND B AG, IMMUNOASSAY
INFLUENZA A ANTIGEN: NOT DETECTED
INFLUENZA B ANTIGEN: NOT DETECTED

## 2022-05-24 MED ORDER — MOLNUPIRAVIR EUA 200MG CAPSULE: 4.0000 | ORAL_CAPSULE | Freq: Two times a day (BID) | ORAL | 0 refills | Status: AC

## 2022-05-24 NOTE — Progress Notes (Signed)
Subjective:    Patient ID: Tyler Allison, male    DOB: [birth year], 82 y.o.   MRN: [id number]  Fever   Symptoms began Sunday with a low-grade fever.  He also reports body aches.  Since that time he reports a dull headache over his frontal sinuses.  He reports facial pain.  He reports headache postnasal drip and head congestion.  He has a dry cough.  He denies any chest pain or pleurisy or purulent sputum.  He denies any nausea or vomiting or diarrhea Subjective:    Past Medical History:  Diagnosis Date   Allergy    Anxiety    Asthma    Cancer ([place])    prostate cancer   Cervical spondylosis with radiculopathy    left c6 radiculopathy   Chronic back pain    GERD (gastroesophageal reflux disease)    Hyperlipidemia    Hypertension    PAF (paroxysmal atrial fibrillation) (HCC)    Prostate cancer (HCC)    Past Surgical History:  Procedure Laterality Date   BACK SURGERY     MICROLARYNGOSCOPY W/VOCAL CORD INJECTION N/A 10/30/2020   Procedure: MICRODIRECT LARYNGOSCOPY WITH VOCAL CORD INJECTION AND CO2 LASER APPLICATION;  Surgeon: Melida Quitter, MD;  Location: [facility];  Service: ENT;  Laterality: N/A;   PROSTATE BIOPSY     SPINE SURGERY     back surgery x 3 including lumbar fusion, neck surgery x 2 including C4-6 ACDF   tympanoplasty with mastoidectomy Left 01/13/2004   Current Outpatient Medications on File Prior to Visit  Medication Sig Dispense Refill   acetaminophen (TYLENOL) 500 MG tablet Take 500 mg by mouth every 6 (six) hours as needed for moderate pain.     albuterol (PROAIR HFA) 108 (90 BASE) MCG/ACT inhaler Inhale 2 puffs into the lungs every 4 (four) hours as needed. 8.5 g 3   apixaban (ELIQUIS) 5 MG TABS tablet Take 1 tablet (5 mg total) by mouth 2 (two) times daily. 120 tablet 3   atorvastatin (LIPITOR) 40 MG tablet TAKE 1 TABLET(40 MG) BY MOUTH DAILY 90 tablet 3   budesonide-formoterol (SYMBICORT) 80-4.5 MCG/ACT inhaler Inhale 2 puffs into the lungs 2 (two) times daily.  (Patient not taking: Reported on 02/18/2022) 1 each 5   clonazePAM (KLONOPIN) 0.5 MG tablet TAKE 1 TABLET(0.5 MG) BY MOUTH TWICE DAILY AS NEEDED FOR ANXIETY 60 tablet 1   cyclobenzaprine (FLEXERIL) 10 MG tablet Take 1 tablet (10 mg total) by mouth 3 (three) times daily as needed for muscle spasms. 30 tablet 0   fluticasone (FLONASE) 50 MCG/ACT nasal spray Use 2 Sprays in each nostril every day at night 16 g 6   loratadine (CLARITIN) 10 MG tablet Take 1 tablet (10 mg total) by mouth daily. 30 tablet 5   Melatonin 10 MG TABS Take 10 mg by mouth at bedtime as needed (sleep).     metoprolol succinate (TOPROL-XL) 25 MG 24 hr tablet TAKE ONE TABLET BY MOUTH EVERY EVENING 90 tablet 3   pantoprazole (PROTONIX) 40 MG tablet TAKE 1 TABLET(40 MG) BY MOUTH TWICE DAILY 180 tablet 3   traMADol (ULTRAM) 50 MG tablet Take 1 tablet (50 mg total) by mouth at bedtime as needed for moderate pain. 30 tablet 0   venlafaxine (EFFEXOR) 25 MG tablet Take 25 mg by mouth 2 (two) times daily. (Patient not taking: Reported on 02/18/2022)     No current facility-administered medications on file prior to visit.   Allergies  Allergen Reactions   Codeine  Nausea And Vomiting   Social History   Socioeconomic History   Marital status: Married    Spouse name: Not on file   Number of children: 2   Years of education: Not on file   Highest education level: Not on file  Occupational History    Comment: retired  Tobacco Use   Smoking status: Former    Packs/day: 0.50    Years: 13.00    Total pack years: 6.50    Types: Cigarettes    Start date: 73    Quit date: 10/23/1968    Years since quitting: 53.6   Smokeless tobacco: Never  Vaping Use   Vaping Use: Never used  Substance and Sexual Activity   Alcohol use: No   Drug use: No   Sexual activity: Not Currently    Comment: married, retired.  Other Topics Concern   Not on file  Social History Narrative   2 children but 1 deceased   Social Determinants of Health    Financial Resource Strain: Low Risk  (02/18/2022)   Overall Financial Resource Strain (CARDIA)    Difficulty of Paying Living Expenses: Not very hard  Food Insecurity: No Food Insecurity (02/18/2022)   Hunger Vital Sign    Worried About Running Out of Food in the Last Year: Never true    [place] in the Last Year: Never true  Transportation Needs: Unknown (02/18/2022)   PRAPARE - Hydrologist (Medical): No    Lack of Transportation (Non-Medical): Not on file  Physical Activity: Insufficiently Active (02/18/2022)   Exercise Vital Sign    Days of Exercise per Week: 1 day    Minutes of Exercise per Session: 20 min  Stress: No Stress Concern Present (02/18/2022)   [place]    Feeling of Stress : Not at all  Social Connections: [place] (02/18/2022)   Social Connection and Isolation Panel [NHANES]    Frequency of Communication with Friends and Family: Three times a week    Frequency of Social Gatherings with Friends and Family: Three times a week    Attends Religious Services: 1 to 4 times per year    Active Member of Clubs or Organizations: No    Attends Archivist Meetings: 1 to 4 times per year    Marital Status: Married  Human resources officer Violence: Not At Risk (02/18/2022)   Humiliation, Afraid, Rape, and Kick questionnaire    Fear of Current or Ex-Partner: No    Emotionally Abused: No    Physically Abused: No    Sexually Abused: No   Family History  Problem Relation Age of Onset   Heart failure Mother    Esophageal cancer Father    Heart attack Maternal Uncle    Breast cancer Neg Hx    Prostate cancer Neg Hx    Pancreatic cancer Neg Hx    Colon cancer Neg Hx     Review of Systems  Constitutional:  Positive for fever.  All other systems reviewed and are negative.      Objective:   Physical Exam Vitals reviewed.  Constitutional:       General: He is not in acute distress.    Appearance: He is well-developed. He is not diaphoretic.  HENT:     Head: Normocephalic and atraumatic.     Right Ear: Tympanic membrane and ear canal normal.     Left Ear: Tympanic membrane and ear  canal normal.     Nose: Congestion and rhinorrhea present.     Mouth/Throat:     Mouth: Mucous membranes are moist.     Pharynx: Oropharynx is clear. No oropharyngeal exudate or posterior oropharyngeal erythema.  Eyes:     Conjunctiva/sclera: Conjunctivae normal.  Neck:     Thyroid: No thyromegaly.     Vascular: No JVD.     Trachea: No tracheal deviation.  Cardiovascular:     Rate and Rhythm: Normal rate and regular rhythm.     Heart sounds: Normal heart sounds. No murmur heard.    No friction rub. No gallop.  Pulmonary:     Effort: Pulmonary effort is normal. No respiratory distress.     Breath sounds: Normal breath sounds. No stridor. No wheezing or rales.  Chest:     Chest wall: No tenderness.  Neurological:     Mental Status: He is alert.     Motor: No abnormal muscle tone.         Assessment & Plan:  Acute viral syndrome - Plan: Influenza A and B Ag, Immunoassay Patient's symptoms suggest a viral syndrome.  I suspect influenza versus COVID.  We performed a flu test and a COVID test today in the office.  COVID positive, begin molnupiravir 4 pobid for 5 days

## 2022-05-26 ENCOUNTER — Other Ambulatory Visit: Payer: Self-pay | Admitting: Family Medicine

## 2022-05-27 ENCOUNTER — Telehealth: Payer: Self-pay

## 2022-05-27 NOTE — Telephone Encounter (Signed)
Fyi  Pt called today stated that he noticed just a little bit of blood w/in his phlegm today due to his cough. Per pt said that this is the first time he noticed this and he believes that he thinks it's because he was cough pretty hard to get that phlegm out.   Pt was worried since he is on blood thinner and just wanted to let you be aware/plus if its anything he should worried about. Pt also stated that he is feeling better today.   Suggest pt to just keep an eye on phlegm and contact office if he sees more blood.   Pls advice if any.

## 2022-05-30 ENCOUNTER — Other Ambulatory Visit: Payer: Self-pay

## 2022-05-30 ENCOUNTER — Telehealth: Payer: Self-pay | Admitting: Pharmacist

## 2022-05-30 DIAGNOSIS — E782 Mixed hyperlipidemia: Secondary | ICD-10-CM

## 2022-05-30 DIAGNOSIS — G8929 Other chronic pain: Secondary | ICD-10-CM

## 2022-05-30 DIAGNOSIS — M4722 Other spondylosis with radiculopathy, cervical region: Secondary | ICD-10-CM

## 2022-05-30 DIAGNOSIS — K219 Gastro-esophageal reflux disease without esophagitis: Secondary | ICD-10-CM

## 2022-05-30 MED ORDER — PANTOPRAZOLE SODIUM 40 MG PO TBEC: DELAYED_RELEASE_TABLET | ORAL | 3 refills | Status: DC

## 2022-05-30 MED ORDER — ATORVASTATIN CALCIUM 40 MG PO TABS: ORAL_TABLET | ORAL | 3 refills | Status: DC

## 2022-05-30 MED ORDER — TRAMADOL HCL 50 MG PO TABS: 50.0000 mg | ORAL_TABLET | Freq: Every evening | ORAL | 0 refills | Status: DC | PRN

## 2022-05-30 NOTE — Progress Notes (Signed)
Care Management & Coordination Services Pharmacy Team  Reason for Encounter: Medication coordination and delivery  Contacted patient on 05/30/2022 to discuss medications   Recent office visits:  05/24/2022 OV (PCP) [name], MD;  COVID positive, begin molnupiravir 4 pobid for 5 days   Recent consult visits:  04/20/2022 OV ([facility]) Supple, Justice Britain, MD; no medication changes indicated.  Hospital visits:  None in previous 6 months  Medications: Outpatient Encounter Medications as of 05/30/2022  Medication Sig   acetaminophen (TYLENOL) 500 MG tablet Take 500 mg by mouth every 6 (six) hours as needed for moderate pain.   albuterol (PROAIR HFA) 108 (90 BASE) MCG/ACT inhaler Inhale 2 puffs into the lungs every 4 (four) hours as needed.   apixaban (ELIQUIS) 5 MG TABS tablet Take 1 tablet (5 mg total) by mouth 2 (two) times daily.   atorvastatin (LIPITOR) 40 MG tablet TAKE 1 TABLET(40 MG) BY MOUTH DAILY   clonazePAM (KLONOPIN) 0.5 MG tablet TAKE 1 TABLET(0.5 MG) BY MOUTH TWICE DAILY AS NEEDED FOR ANXIETY   cyclobenzaprine (FLEXERIL) 10 MG tablet Take 1 tablet (10 mg total) by mouth 3 (three) times daily as needed for muscle spasms.   fluticasone (FLONASE) 50 MCG/ACT nasal spray Use 2 Sprays in each nostril every day at night   loratadine (CLARITIN) 10 MG tablet Take 1 tablet (10 mg total) by mouth daily.   Melatonin 10 MG TABS Take 10 mg by mouth at bedtime as needed (sleep).   metoprolol succinate (TOPROL-XL) 25 MG 24 hr tablet TAKE ONE TABLET BY MOUTH EVERY EVENING   pantoprazole (PROTONIX) 40 MG tablet TAKE 1 TABLET(40 MG) BY MOUTH TWICE DAILY   traMADol (ULTRAM) 50 MG tablet Take 1 tablet (50 mg total) by mouth at bedtime as needed for moderate pain.   venlafaxine (EFFEXOR) 25 MG tablet Take 25 mg by mouth 2 (two) times daily.   No facility-administered encounter medications on file as of 05/30/2022.   BP Readings from Last 3 Encounters:  05/24/22 124/68  10/07/21  122/68  10/05/21 122/76    Pulse Readings from Last 3 Encounters:  05/24/22 (!) 103  10/07/21 (!) 105  10/05/21 77    No results found for: "HGBA1C" Lab Results  Component Value Date   CREATININE 0.84 03/18/2021   BUN 18 03/18/2021   GFR 74.56 07/03/2019   GFRNONAA >60 10/08/2020   GFRAA 85 03/23/2020   NA 139 03/18/2021   K 4.3 03/18/2021   CALCIUM 8.9 03/18/2021   CO2 31 03/18/2021     Last adherence delivery date: 03/10/2022      Patient is due for next adherence delivery on: 06/08/2022  Spoke with patient on 05/30/2022 reviewed medications and coordinated delivery.  This delivery to include: Vials  90 Days  Tamsulosin 0.4 mg 1 at evening meal Metoprolol 25 mg 1 at evening meal Atorvastatin 40 mg 1 at evening meal Tramadol 50 mg every 8 hours as needed Fluticasone nasal spray  Patient declined the following medications this month: Pantoprazole 40 mg 1 at breakfast, 1 at evening meal  Refills requested from providers include: Atorvastatin 40 mg 1 at evening meal Tramadol 50 mg every 8 hours as needed Pantoprazole 40 mg 1 at breakfast, 1 at evening meal  Confirmed delivery date of 06/08/2022, advised patient that pharmacy will contact them the morning of delivery.   Any concerns about your medications? No  How often do you forget or accidentally miss a dose? Never  Do you use a pillbox?  Yes  Is patient in packaging No  If yes  What is the date on your next pill pack?  Any concerns or issues with your packaging?   Cycle dispensing form sent to Leata Mouse, [facility] for review.  No future appointments.  April D Calhoun, [place] Pharmacist Assistant [phone]

## 2022-06-02 ENCOUNTER — Other Ambulatory Visit: Payer: Self-pay | Admitting: Family Medicine

## 2022-06-02 DIAGNOSIS — M4722 Other spondylosis with radiculopathy, cervical region: Secondary | ICD-10-CM

## 2022-06-02 DIAGNOSIS — G8929 Other chronic pain: Secondary | ICD-10-CM

## 2022-06-03 IMAGING — DX DG CHEST 2V
2 series · 2 of 2 positions shown · non-contrast
Comparison: 08/11/2020

CLINICAL DATA: Persistent productive cough.

EXAM:
CHEST - 2 VIEW

[chest pa]
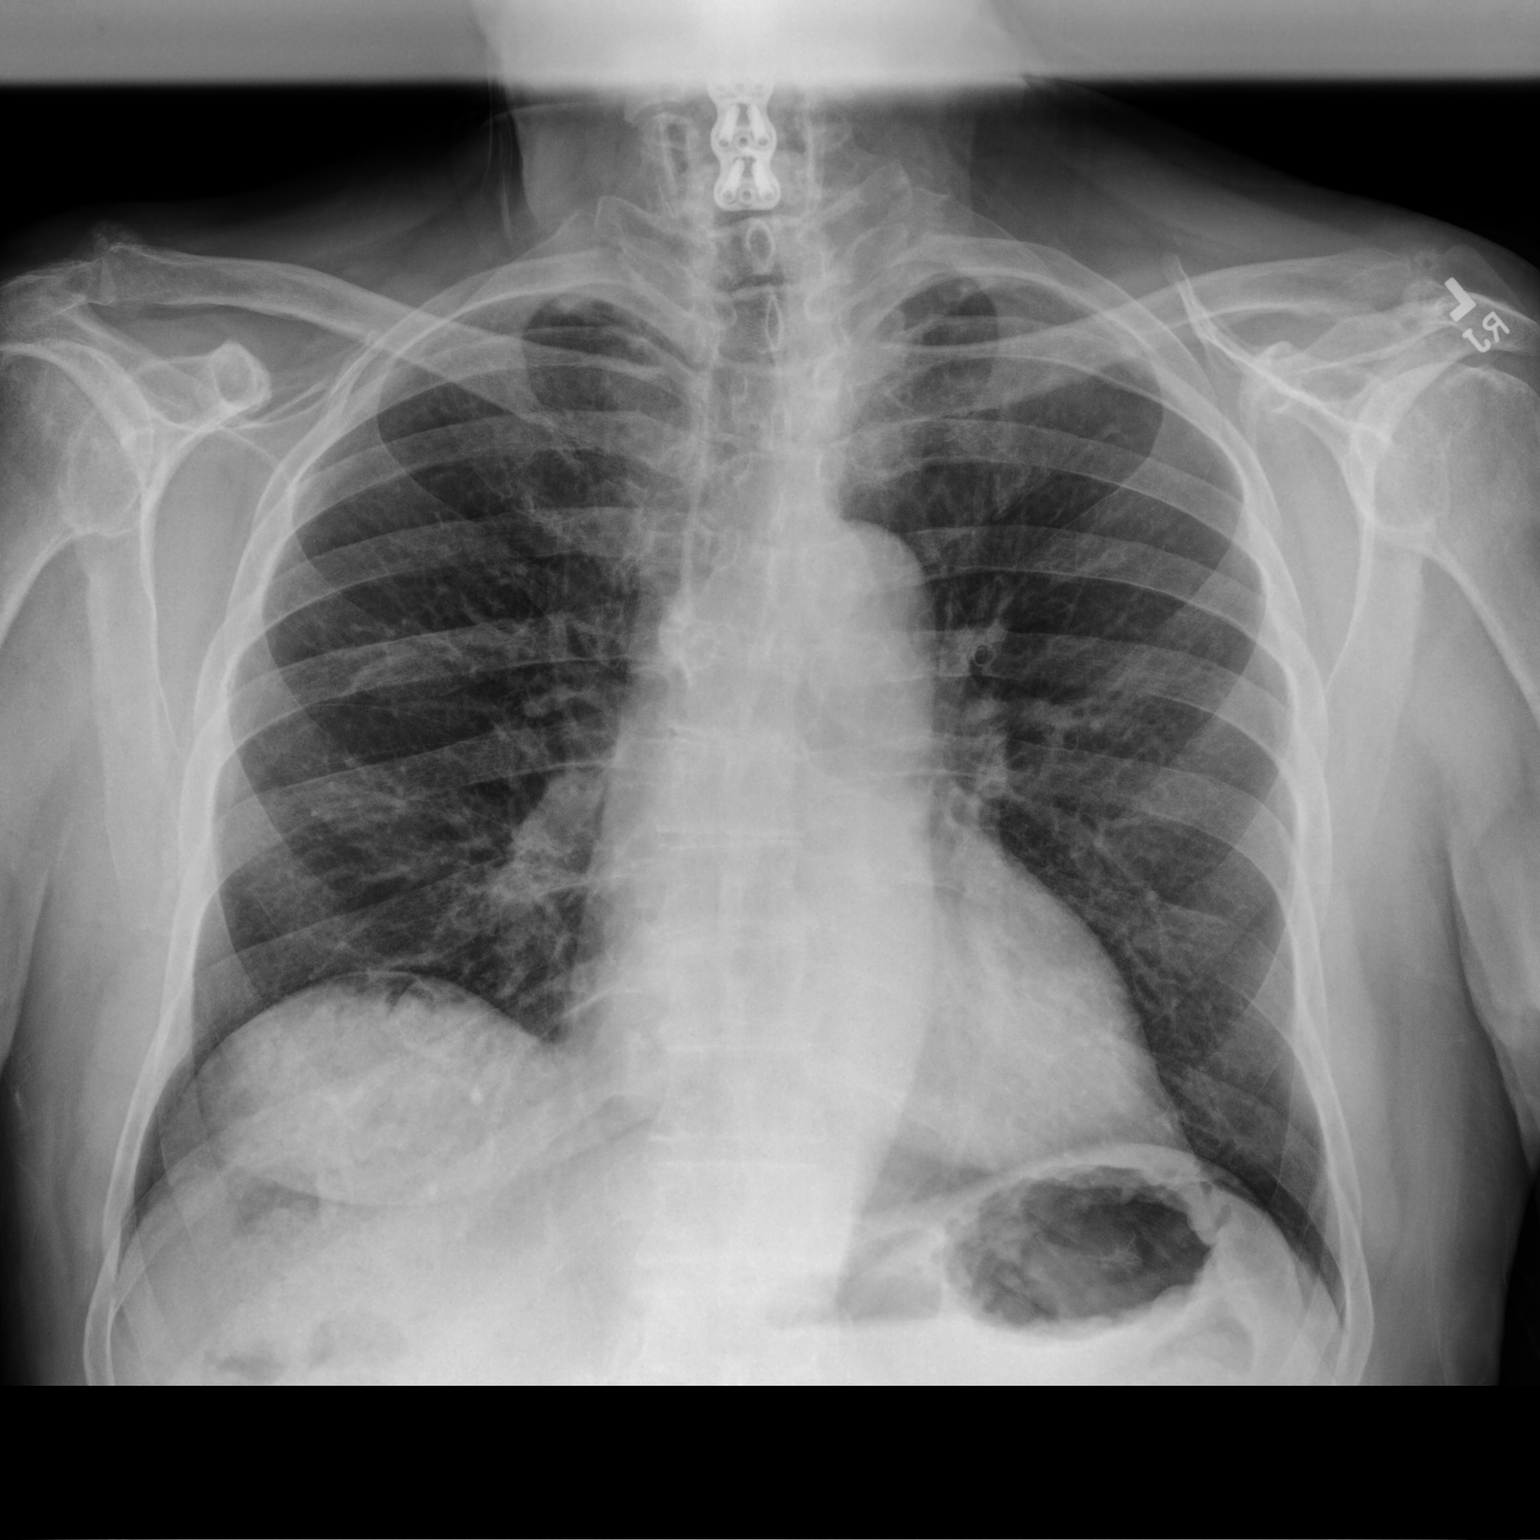

[chest lat]
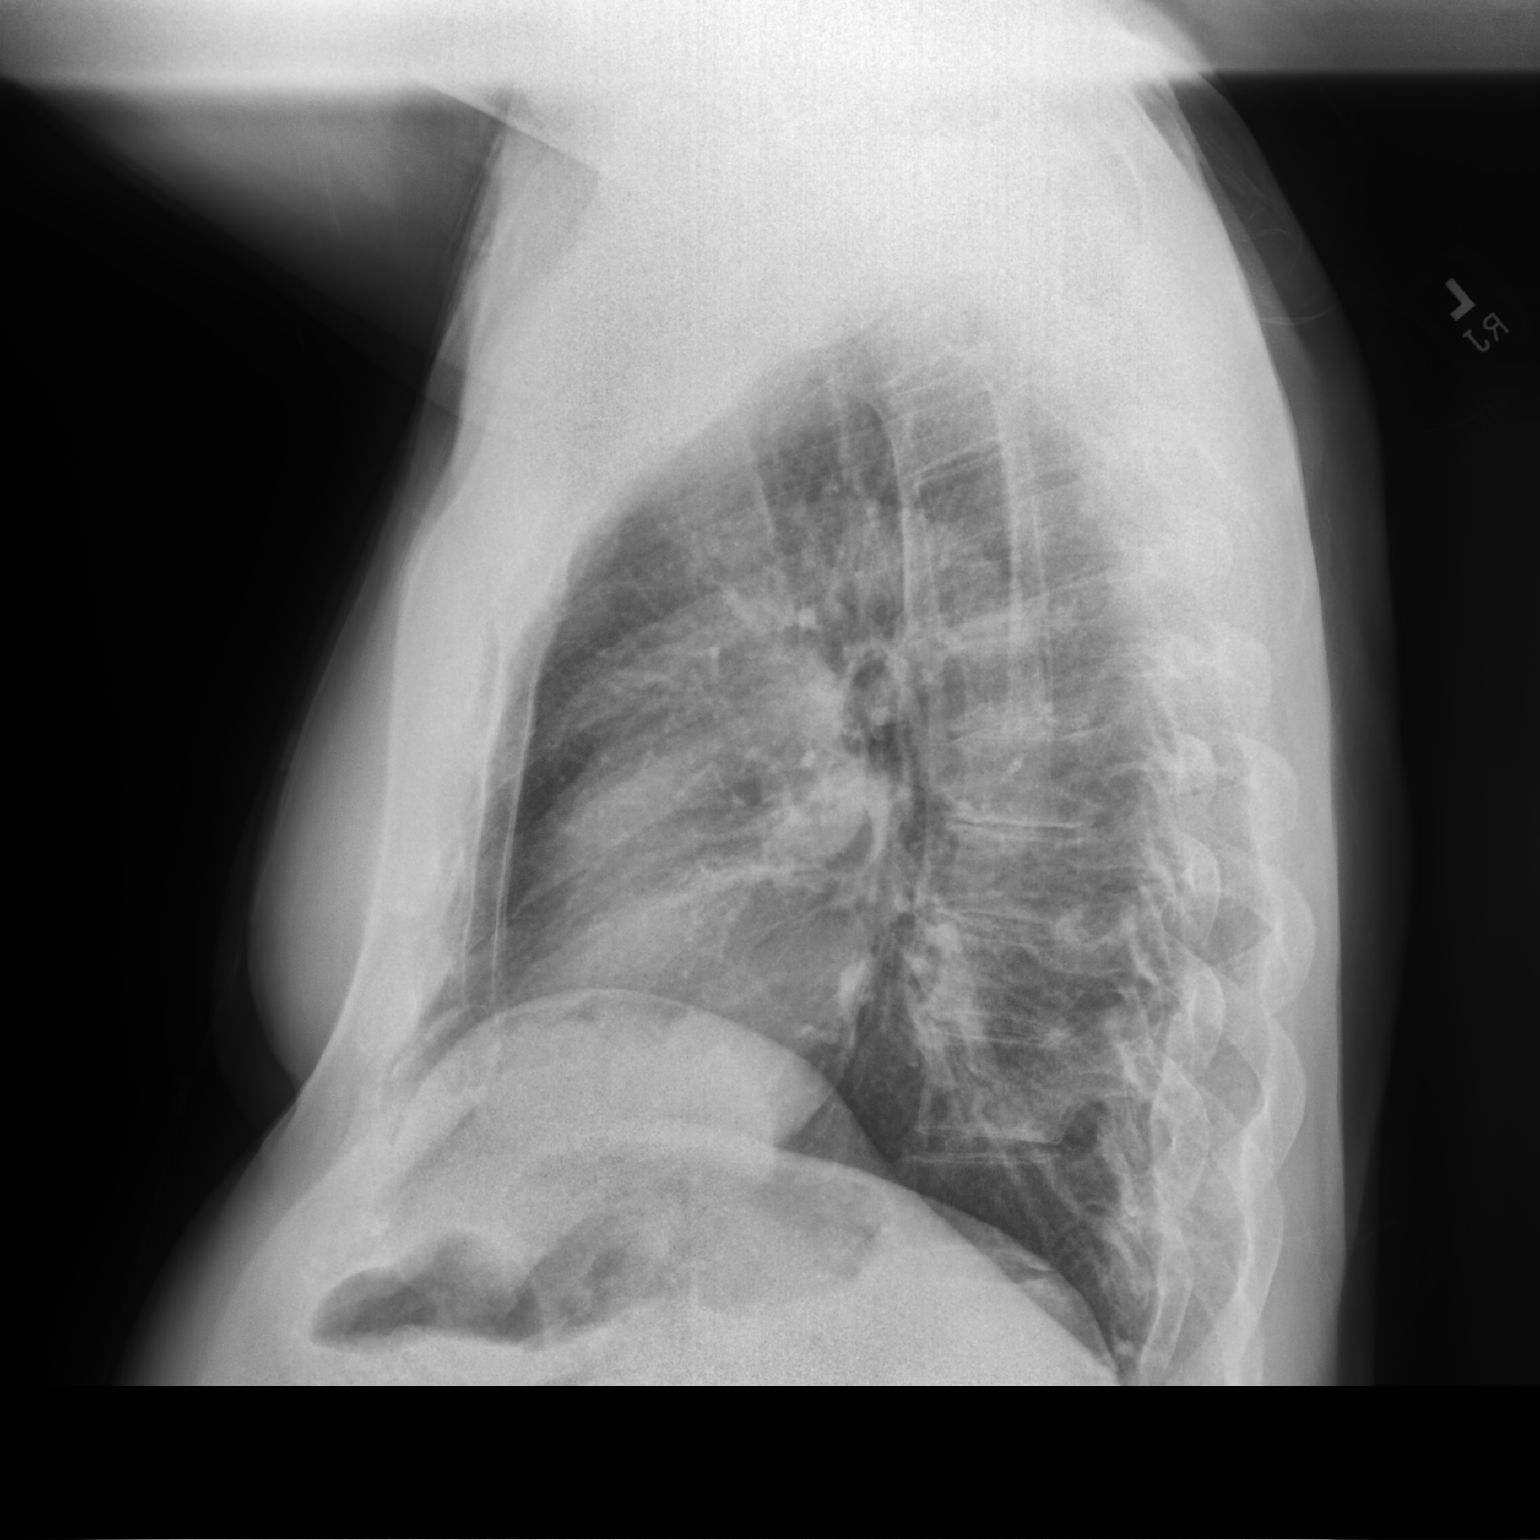

[2 of 2 positions shown; findings below may reference images not displayed]

FINDINGS: The lungs are clear without focal pneumonia, edema, pneumothorax or
pleural effusion. The cardiopericardial silhouette is within normal
limits for size. The visualized bony structures of the thorax are
unremarkable.
IMPRESSION: No active cardiopulmonary disease.

## 2022-06-07 ENCOUNTER — Other Ambulatory Visit: Payer: Self-pay | Admitting: Family Medicine

## 2022-06-07 DIAGNOSIS — M4722 Other spondylosis with radiculopathy, cervical region: Secondary | ICD-10-CM

## 2022-06-07 DIAGNOSIS — G8929 Other chronic pain: Secondary | ICD-10-CM

## 2022-06-14 DIAGNOSIS — H353211 Exudative age-related macular degeneration, right eye, with active choroidal neovascularization: Secondary | ICD-10-CM | POA: Diagnosis not present

## 2022-06-15 DIAGNOSIS — M25531 Pain in right wrist: Secondary | ICD-10-CM | POA: Insufficient documentation

## 2022-06-15 DIAGNOSIS — M79644 Pain in right finger(s): Secondary | ICD-10-CM | POA: Insufficient documentation

## 2022-06-15 DIAGNOSIS — M654 Radial styloid tenosynovitis [de Quervain]: Secondary | ICD-10-CM | POA: Diagnosis not present

## 2022-06-15 DIAGNOSIS — M65341 Trigger finger, right ring finger: Secondary | ICD-10-CM | POA: Diagnosis not present

## 2022-06-15 DIAGNOSIS — M65331 Trigger finger, right middle finger: Secondary | ICD-10-CM | POA: Diagnosis not present

## 2022-06-18 DIAGNOSIS — M654 Radial styloid tenosynovitis [de Quervain]: Secondary | ICD-10-CM | POA: Insufficient documentation

## 2022-06-28 DIAGNOSIS — H353221 Exudative age-related macular degeneration, left eye, with active choroidal neovascularization: Secondary | ICD-10-CM | POA: Diagnosis not present

## 2022-07-07 IMAGING — CR DG ABDOMEN 2V
3 series · 3 of 3 positions shown · non-contrast
Comparison: 08/07/2020 PET.  CT 03/02/2012

CLINICAL DATA: Abdominal pain and constipation bloating.

EXAM:
ABDOMEN - 2 VIEW

[w abdomen upright]
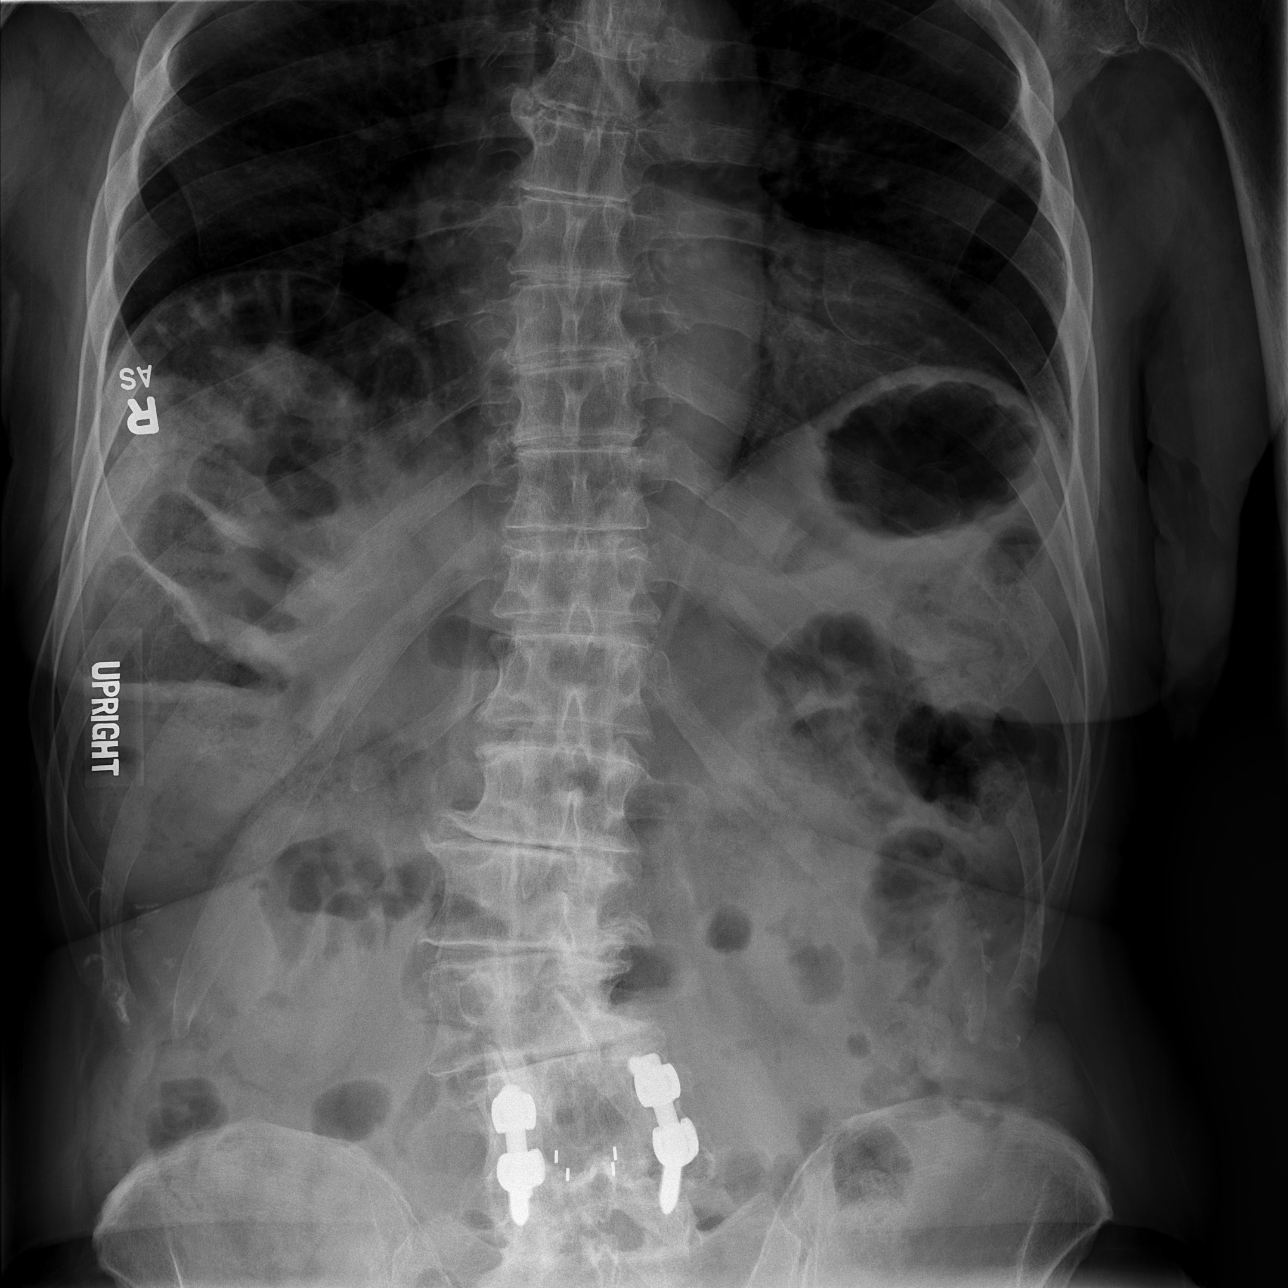

[t abdomen supine (1 of 2)]
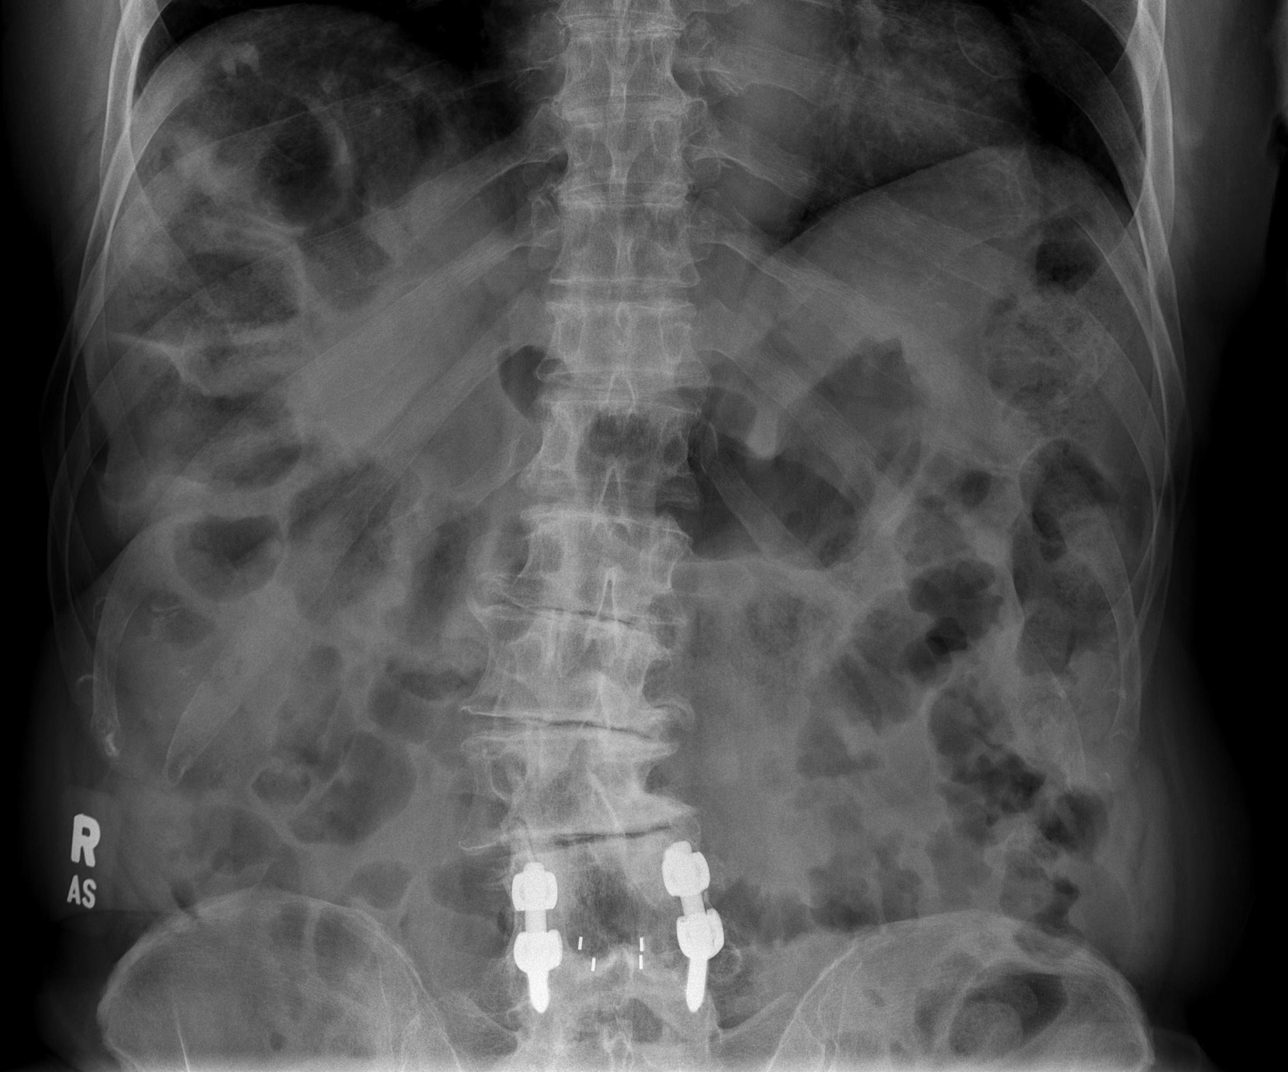

[t abdomen supine (2 of 2)]
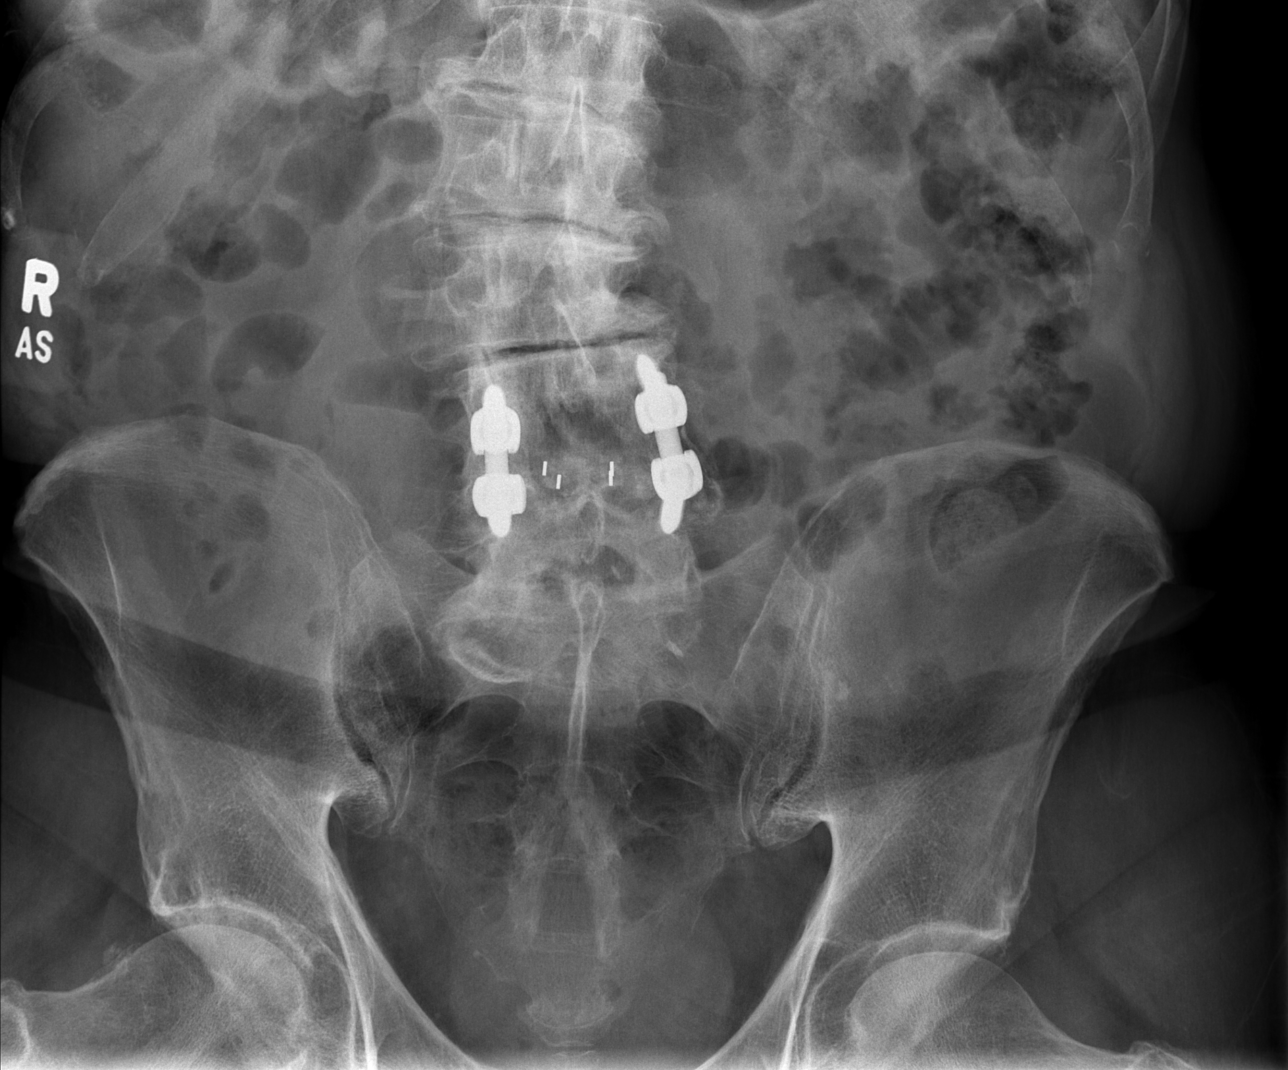

[3 of 3 positions shown; findings below may reference images not displayed]

FINDINGS: Upright and supine views. The upright view demonstrates no free
intraperitoneal air or significant air-fluid levels.

Supine view demonstrates gas within normal caliber small bowel
loops. Gas and stool throughout the colon. No abnormal abdominal
calcifications. No appendicolith. Lumbosacral spine fixation with
lumbar spondylosis and minimal convex right lumbar spine curvature.
Mild right hemidiaphragm elevation.
IMPRESSION: No acute findings.

## 2022-07-11 DIAGNOSIS — H35723 Serous detachment of retinal pigment epithelium, bilateral: Secondary | ICD-10-CM | POA: Diagnosis not present

## 2022-07-11 DIAGNOSIS — H353231 Exudative age-related macular degeneration, bilateral, with active choroidal neovascularization: Secondary | ICD-10-CM | POA: Diagnosis not present

## 2022-07-11 DIAGNOSIS — H35363 Drusen (degenerative) of macula, bilateral: Secondary | ICD-10-CM | POA: Diagnosis not present

## 2022-07-11 DIAGNOSIS — Z961 Presence of intraocular lens: Secondary | ICD-10-CM | POA: Diagnosis not present

## 2022-07-11 DIAGNOSIS — H35453 Secondary pigmentary degeneration, bilateral: Secondary | ICD-10-CM | POA: Diagnosis not present

## 2022-08-02 DIAGNOSIS — L821 Other seborrheic keratosis: Secondary | ICD-10-CM | POA: Diagnosis not present

## 2022-08-02 DIAGNOSIS — Z08 Encounter for follow-up examination after completed treatment for malignant neoplasm: Secondary | ICD-10-CM | POA: Diagnosis not present

## 2022-08-02 DIAGNOSIS — B353 Tinea pedis: Secondary | ICD-10-CM | POA: Diagnosis not present

## 2022-08-02 DIAGNOSIS — H353211 Exudative age-related macular degeneration, right eye, with active choroidal neovascularization: Secondary | ICD-10-CM | POA: Diagnosis not present

## 2022-08-02 DIAGNOSIS — L814 Other melanin hyperpigmentation: Secondary | ICD-10-CM | POA: Diagnosis not present

## 2022-08-02 DIAGNOSIS — L570 Actinic keratosis: Secondary | ICD-10-CM | POA: Diagnosis not present

## 2022-08-02 DIAGNOSIS — L720 Epidermal cyst: Secondary | ICD-10-CM | POA: Diagnosis not present

## 2022-08-02 DIAGNOSIS — D225 Melanocytic nevi of trunk: Secondary | ICD-10-CM | POA: Diagnosis not present

## 2022-08-02 DIAGNOSIS — Z85828 Personal history of other malignant neoplasm of skin: Secondary | ICD-10-CM | POA: Diagnosis not present

## 2022-08-02 DIAGNOSIS — B351 Tinea unguium: Secondary | ICD-10-CM | POA: Diagnosis not present

## 2022-08-12 DIAGNOSIS — H353221 Exudative age-related macular degeneration, left eye, with active choroidal neovascularization: Secondary | ICD-10-CM | POA: Diagnosis not present

## 2022-08-24 DIAGNOSIS — H0100B Unspecified blepharitis left eye, upper and lower eyelids: Secondary | ICD-10-CM | POA: Diagnosis not present

## 2022-08-24 DIAGNOSIS — H43813 Vitreous degeneration, bilateral: Secondary | ICD-10-CM | POA: Diagnosis not present

## 2022-08-24 DIAGNOSIS — H0100A Unspecified blepharitis right eye, upper and lower eyelids: Secondary | ICD-10-CM | POA: Diagnosis not present

## 2022-08-24 DIAGNOSIS — H26491 Other secondary cataract, right eye: Secondary | ICD-10-CM | POA: Diagnosis not present

## 2022-08-24 DIAGNOSIS — H353231 Exudative age-related macular degeneration, bilateral, with active choroidal neovascularization: Secondary | ICD-10-CM | POA: Diagnosis not present

## 2022-08-24 DIAGNOSIS — H524 Presbyopia: Secondary | ICD-10-CM | POA: Diagnosis not present

## 2022-08-24 DIAGNOSIS — H52203 Unspecified astigmatism, bilateral: Secondary | ICD-10-CM | POA: Diagnosis not present

## 2022-08-26 ENCOUNTER — Telehealth: Payer: Self-pay | Admitting: Pharmacist

## 2022-08-26 ENCOUNTER — Other Ambulatory Visit: Payer: Self-pay | Admitting: Family Medicine

## 2022-08-26 NOTE — Progress Notes (Signed)
Care Management & Coordination Services Pharmacy Team  Reason for Encounter: Medication coordination and delivery  Contacted patient to discuss medications and coordinate delivery from [facility]. Spoke with patient on 08/29/2022  Cycle dispensing form sent to Erskine Emery for review.   Last adherence delivery date: 06/08/2022      Patient is due for next adherence delivery on: 09/07/2022  This delivery to include: Vials  90 Days  Tamsulosin 0.4 mg 1 at evening meal Metoprolol 25 mg 1 at evening meal Atorvastatin 40 mg 1 at evening meal Tramadol 50 mg every 8 hours as needed Fluticasone nasal spray   Refills requested from providers include: Tramadol and fluticasone  Confirmed delivery date of 09/14/2022, advised patient that pharmacy will contact them the morning of delivery.  -Patient requested his delivery to be pushed back 1 week. He states he has extra medication on hand.  Any concerns about your medications? No  How often do you forget or accidentally miss a dose? Rarely   Is patient in packaging No  If yes  What is the date on your next pill pack?  Any concerns or issues with your packaging?   Chart review: Recent office visits:  None  Recent consult visits:  None   Hospital visits:  None in previous 6 months  Medications: Outpatient Encounter Medications as of 08/26/2022  Medication Sig   acetaminophen (TYLENOL) 500 MG tablet Take 500 mg by mouth every 6 (six) hours as needed for moderate pain.   albuterol (PROAIR HFA) 108 (90 BASE) MCG/ACT inhaler Inhale 2 puffs into the lungs every 4 (four) hours as needed.   apixaban (ELIQUIS) 5 MG TABS tablet Take 1 tablet (5 mg total) by mouth 2 (two) times daily.   atorvastatin (LIPITOR) 40 MG tablet TAKE 1 TABLET(40 MG) BY MOUTH DAILY   clonazePAM (KLONOPIN) 0.5 MG tablet TAKE 1 TABLET(0.5 MG) BY MOUTH TWICE DAILY AS NEEDED FOR ANXIETY   cyclobenzaprine (FLEXERIL) 10 MG tablet Take 1 tablet (10 mg total) by mouth  3 (three) times daily as needed for muscle spasms.   fluticasone (FLONASE) 50 MCG/ACT nasal spray instill two SPRAYS in each nostril daily AT night   loratadine (CLARITIN) 10 MG tablet Take 1 tablet (10 mg total) by mouth daily.   Melatonin 10 MG TABS Take 10 mg by mouth at bedtime as needed (sleep).   metoprolol succinate (TOPROL-XL) 25 MG 24 hr tablet TAKE ONE TABLET BY MOUTH EVERY EVENING   pantoprazole (PROTONIX) 40 MG tablet TAKE 1 TABLET(40 MG) BY MOUTH TWICE DAILY   traMADol (ULTRAM) 50 MG tablet Take 1 tablet (50 mg total) by mouth at bedtime as needed for moderate pain.   venlafaxine (EFFEXOR) 25 MG tablet Take 25 mg by mouth 2 (two) times daily.   No facility-administered encounter medications on file as of 08/26/2022.   BP Readings from Last 3 Encounters:  05/24/22 124/68  10/07/21 122/68  10/05/21 122/76    Pulse Readings from Last 3 Encounters:  05/24/22 (!) 103  10/07/21 (!) 105  10/05/21 77    No results found for: "HGBA1C" Lab Results  Component Value Date   CREATININE 0.84 03/18/2021   BUN 18 03/18/2021   GFR 74.56 07/03/2019   GFRNONAA >60 10/08/2020   GFRAA 85 03/23/2020   NA 139 03/18/2021   K 4.3 03/18/2021   CALCIUM 8.9 03/18/2021   CO2 31 03/18/2021     Future Appointments  Date Time Provider Department Center  10/10/2022  1:05 PM Jari Favre  Dorris Carnes, PA-C [facility]   April D Calhoun, [facility] Clinical Pharmacist Assistant [phone]

## 2022-08-29 ENCOUNTER — Other Ambulatory Visit: Payer: Self-pay | Admitting: Family Medicine

## 2022-08-29 ENCOUNTER — Telehealth: Payer: Self-pay

## 2022-08-29 DIAGNOSIS — G8929 Other chronic pain: Secondary | ICD-10-CM

## 2022-08-29 DIAGNOSIS — M4722 Other spondylosis with radiculopathy, cervical region: Secondary | ICD-10-CM

## 2022-08-29 MED ORDER — TRAMADOL HCL 50 MG PO TABS: 50.0000 mg | ORAL_TABLET | Freq: Every evening | ORAL | 0 refills | Status: DC | PRN

## 2022-08-29 NOTE — Telephone Encounter (Signed)
Staff message from April Calhoun with [facility]:  Patient has an upcoming delivery with [facility]. He will need refills on Tramadol and fluticasone to complete his order. Please send refills to [facility],   Fluticasone has been sent in. Last RF on Tramadol was 05/30/2022. Thank you.

## 2022-09-02 DIAGNOSIS — M25551 Pain in right hip: Secondary | ICD-10-CM | POA: Insufficient documentation

## 2022-09-08 DIAGNOSIS — M1611 Unilateral primary osteoarthritis, right hip: Secondary | ICD-10-CM | POA: Diagnosis not present

## 2022-09-12 DIAGNOSIS — M65341 Trigger finger, right ring finger: Secondary | ICD-10-CM | POA: Diagnosis not present

## 2022-09-12 DIAGNOSIS — M654 Radial styloid tenosynovitis [de Quervain]: Secondary | ICD-10-CM | POA: Diagnosis not present

## 2022-09-12 DIAGNOSIS — M65331 Trigger finger, right middle finger: Secondary | ICD-10-CM | POA: Diagnosis not present

## 2022-09-16 DIAGNOSIS — H353221 Exudative age-related macular degeneration, left eye, with active choroidal neovascularization: Secondary | ICD-10-CM | POA: Diagnosis not present

## 2022-09-19 DIAGNOSIS — D6869 Other thrombophilia: Secondary | ICD-10-CM | POA: Diagnosis not present

## 2022-09-19 DIAGNOSIS — M5416 Radiculopathy, lumbar region: Secondary | ICD-10-CM | POA: Insufficient documentation

## 2022-09-19 DIAGNOSIS — M5451 Vertebrogenic low back pain: Secondary | ICD-10-CM | POA: Diagnosis not present

## 2022-09-19 DIAGNOSIS — M961 Postlaminectomy syndrome, not elsewhere classified: Secondary | ICD-10-CM | POA: Diagnosis not present

## 2022-09-19 DIAGNOSIS — I4891 Unspecified atrial fibrillation: Secondary | ICD-10-CM | POA: Diagnosis not present

## 2022-09-20 DIAGNOSIS — H353211 Exudative age-related macular degeneration, right eye, with active choroidal neovascularization: Secondary | ICD-10-CM | POA: Diagnosis not present

## 2022-09-30 DIAGNOSIS — L820 Inflamed seborrheic keratosis: Secondary | ICD-10-CM | POA: Diagnosis not present

## 2022-09-30 DIAGNOSIS — L538 Other specified erythematous conditions: Secondary | ICD-10-CM | POA: Diagnosis not present

## 2022-09-30 DIAGNOSIS — L309 Dermatitis, unspecified: Secondary | ICD-10-CM | POA: Diagnosis not present

## 2022-09-30 DIAGNOSIS — L821 Other seborrheic keratosis: Secondary | ICD-10-CM | POA: Diagnosis not present

## 2022-09-30 DIAGNOSIS — C4442 Squamous cell carcinoma of skin of scalp and neck: Secondary | ICD-10-CM | POA: Diagnosis not present

## 2022-09-30 DIAGNOSIS — D485 Neoplasm of uncertain behavior of skin: Secondary | ICD-10-CM | POA: Diagnosis not present

## 2022-09-30 DIAGNOSIS — L570 Actinic keratosis: Secondary | ICD-10-CM | POA: Diagnosis not present

## 2022-10-04 DIAGNOSIS — M5416 Radiculopathy, lumbar region: Secondary | ICD-10-CM | POA: Diagnosis not present

## 2022-10-06 DIAGNOSIS — J3489 Other specified disorders of nose and nasal sinuses: Secondary | ICD-10-CM | POA: Insufficient documentation

## 2022-10-06 DIAGNOSIS — H6122 Impacted cerumen, left ear: Secondary | ICD-10-CM | POA: Diagnosis not present

## 2022-10-06 DIAGNOSIS — H7292 Unspecified perforation of tympanic membrane, left ear: Secondary | ICD-10-CM | POA: Diagnosis not present

## 2022-10-10 ENCOUNTER — Ambulatory Visit: Payer: PPO | Admitting: Physician Assistant

## 2022-10-10 ENCOUNTER — Ambulatory Visit: Payer: PPO | Attending: Physician Assistant | Admitting: Physician Assistant

## 2022-10-10 VITALS — BP 112/68 | HR 65 | Ht 69.0 in | Wt 199.6 lb

## 2022-10-10 DIAGNOSIS — I10 Essential (primary) hypertension: Secondary | ICD-10-CM | POA: Diagnosis not present

## 2022-10-10 DIAGNOSIS — J45909 Unspecified asthma, uncomplicated: Secondary | ICD-10-CM

## 2022-10-10 DIAGNOSIS — E782 Mixed hyperlipidemia: Secondary | ICD-10-CM | POA: Diagnosis not present

## 2022-10-10 DIAGNOSIS — I480 Paroxysmal atrial fibrillation: Secondary | ICD-10-CM

## 2022-10-10 DIAGNOSIS — R0602 Shortness of breath: Secondary | ICD-10-CM | POA: Diagnosis not present

## 2022-10-10 DIAGNOSIS — R0609 Other forms of dyspnea: Secondary | ICD-10-CM

## 2022-10-10 NOTE — Progress Notes (Signed)
Office Visit    Patient Name: Tyler Allison Date of Encounter: 10/10/2022  PCP:  Donita Brooks, MD   [facility]  Cardiologist:  Lance Muss, MD  Advanced Practice Provider:  No care team member to display Electrophysiologist:  None   HPI    Tyler Allison is a 82 y.o. male with a past medical history of paroxysmal atrial fibrillation, hyperlipidemia, persistent shortness of breath found to be due to deconditioning, prior smoker, central apnea presents today for follow-up appointment.  Patient was found to have irregular heartbeat during neck surgery.  Was placed on monitor showing atrial fibrillation.  Had nocturnal bradycardia with 2-second pauses well.  Placed on Xarelto for anticoagulation.  Echocardiogram 08/28/2018 with LVEF 55 to 60%, no wall motion abnormalities, minimal obstruction, no resp to BD, NI lung volumes, NI diffusion.   Patient was seen by Dr. Eldridge Dace 08/2020.  Limited walking due to joint pain.  Felt shortness of breath due to deconditioning.  Patient was here for follow-up.  Reported improved breathing since Symbicort.  Does yard work without chest pain or significant breathing issue.  No orthopnea, PND, syncope, lower extremity edema or melena.  Reports being in the donut hole at that time for a few weeks.  Afterwards, Xarelto cost about 4000 for 7-month supply.  Today, he is having some shortness of breath and his pulmonary doctors have tried him on a couple different medications without much success.  He states that the shortness of breath happens with exertion.  He tries to go to the gym a couple times a week but is limited in his activities.  He does have a history of atrial fibrillation dating back to 2017 that was diagnosed during back surgery.  He is in normal sinus rhythm today.  He is on Eliquis and is about to enter the donut hole.  He wants to fill out some paperwork for patient assistance today.  He will need a lipid panel  and LFTs when he sees his PCP in a couple of months.  He is asked to please fax these results to [place].  Reports no chest pain, pressure, or tightness. No edema, orthopnea, PND. Reports no palpitations.   Past Medical History    Past Medical History:  Diagnosis Date   Allergy    Anxiety    Asthma    Cancer (HCC)    prostate cancer   Cervical spondylosis with radiculopathy    left c6 radiculopathy   Chronic back pain    GERD (gastroesophageal reflux disease)    Hyperlipidemia    Hypertension    PAF (paroxysmal atrial fibrillation) (HCC)    Prostate cancer (HCC)    Past Surgical History:  Procedure Laterality Date   BACK SURGERY     MICROLARYNGOSCOPY W/VOCAL CORD INJECTION N/A 10/30/2020   Procedure: MICRODIRECT LARYNGOSCOPY WITH VOCAL CORD INJECTION AND CO2 LASER APPLICATION;  Surgeon: Christia Reading, MD;  Location: [facility] OR;  Service: ENT;  Laterality: N/A;   PROSTATE BIOPSY     SPINE SURGERY     back surgery x 3 including lumbar fusion, neck surgery x 2 including C4-6 ACDF   tympanoplasty with mastoidectomy Left 01/13/2004    Allergies  Allergies  Allergen Reactions   Codeine Nausea And Vomiting   EKGs/Labs/Other Studies Reviewed:   The following studies were reviewed today: Cardiac Studies & Procedures       ECHOCARDIOGRAM  ECHOCARDIOGRAM COMPLETE 08/28/2018  Narrative ECHOCARDIOGRAM REPORT    Patient Name:  Tyler Allison Date of Exam: 08/28/2018 Medical Rec #:  [id number]      Height:       70.5 in Accession #:    [id number]     Weight:       205.0 lb Date of Birth:  [birth year]      BSA:          2.12 m Patient Age:    77 years       BP:           118/70 mmHg Patient Gender: M              HR:           67 bpm. Exam Location:  [address]   Procedure: 2D Echo, 3D Echo, Cardiac Doppler, Color Doppler, Strain Analysis and Intracardiac Opacification Agent  Indications:    R06.02 Shortness of breath  History:        Patient has prior history of  Echocardiogram examinations, most recent 02/24/2016. Atrial Fibrillation Risk Factors: Dyslipidemia.  Sonographer:    Jorje Guild RDCS Referring Phys: [id number] [facility]   Sonographer Comments: Suboptimal apical window. IMPRESSIONS   1. The left ventricle has normal systolic function, with an ejection fraction of 55-60%. The cavity size was normal. Left ventricular diastolic Doppler parameters are indeterminate. No evidence of left ventricular regional wall motion abnormalities. 2. The right ventricle has normal systolic function. The cavity was normal. There is no increase in right ventricular wall thickness. 3. The mitral valve is abnormal. There is systolic bowing of the mitral leaflets, without true prolapse. 4. The aortic valve is tricuspid. Mild thickening of the aortic valve. Aortic valve regurgitation is trivial by color flow Doppler. 5. The pulmonic valve was grossly normal. Pulmonic valve regurgitation is mild to moderate is mild by color flow Doppler. 6. The aortic arch is normal in size and structure. 7. There is mild dilatation of the ascending aorta measuring 39 mm.  FINDINGS Left Ventricle: The left ventricle has normal systolic function, with an ejection fraction of 55-60%. The cavity size was normal. There is no increase in left ventricular wall thickness. Left ventricular diastolic Doppler parameters are indeterminate. No evidence of left ventricular regional wall motion abnormalities.. Definity contrast agent was given IV to delineate the left ventricular endocardial borders.  Right Ventricle: The right ventricle has normal systolic function. The cavity was normal. There is no increase in right ventricular wall thickness.  Left Atrium: Left atrial size was normal in size.  Right Atrium: Right atrial size was normal in size. Right atrial pressure is estimated at 8 mmHg.  Interatrial Septum: No atrial level shunt detected by color flow Doppler.  Pericardium: There  is no evidence of pericardial effusion.  Mitral Valve: The mitral valve is abnormal. Systolic bowing of the leaflets, without true prolapse. Mitral valve regurgitation is trivial by color flow Doppler.  Tricuspid Valve: The tricuspid valve is normal in structure. Tricuspid valve regurgitation is trivial by color flow Doppler.  Aortic Valve: The aortic valve is tricuspid Mild thickening of the aortic valve. Aortic valve regurgitation is trivial by color flow Doppler.  Pulmonic Valve: The pulmonic valve was grossly normal. Pulmonic valve regurgitation is mild to moderate is mild by color flow Doppler.  Aorta: The aortic arch is normal in size and structure. There is mild dilatation of the ascending aorta measuring 39 mm.  Venous: The inferior vena cava was not well visualized.  Compared to previous exam: 02/24/16 EF 55-60%.   +--------------+--------++  LEFT VENTRICLE         +----------------+---------++ +--------------+--------++ Diastology                PLAX 2D                +----------------+---------++ +--------------+--------++ LV e' lateral:  9.25 cm/s LVIDd:        4.80 cm  +----------------+---------++ +--------------+--------++ LV E/e' lateral:3.7       LVIDs:        3.40 cm  +----------------+---------++ +--------------+--------++ LV e' medial:   7.18 cm/s LV PW:        0.85 cm  +----------------+---------++ +--------------+--------++ LV E/e' medial: 4.8       LV IVS:       1.00 cm  +----------------+---------++ +--------------+--------++ LVOT diam:    2.00 cm  +----------------------+-------++ +--------------+--------++ 2D Longitudinal Strain        LV SV:        60 ml    +----------------------+-------++ +--------------+--------++ 2D Strain GLS (A2C):  -13.3 % LV SV Index:  27.67    +----------------------+-------++ +--------------+--------++ 2D Strain GLS (A3C):  -12.1 % LVOT Area:    3.14 cm  +----------------------+-------++ +--------------+--------++ 2D Strain GLS (A4C):  -11.0 %                        +----------------------+-------++ +--------------+--------++ 2D Strain GLS Avg:    -12.1 % +----------------------+-------++  +-------------+------++ 3D Volume EF:       +-------------+------++ 3D EF:       54 %   +-------------+------++ LV EDV:      121 ml +-------------+------++ LV ESV:      55 ml  +-------------+------++ LV SV:       65 ml  +-------------+------++  +---------------+----------++ RIGHT VENTRICLE           +---------------+----------++ RV S prime:    11.40 cm/s +---------------+----------++ TAPSE (M-mode):2.4 cm     +---------------+----------++  +---------------+-------++-----------++ LEFT ATRIUM           Index       +---------------+-------++-----------++ LA diam:       3.50 cm1.65 cm/m  +---------------+-------++-----------++ LA Vol (A2C):  48.2 ml22.73 ml/m +---------------+-------++-----------++ LA Vol (A4C):  30.4 ml14.34 ml/m +---------------+-------++-----------++ LA Biplane Vol:38.8 ml18.30 ml/m +---------------+-------++-----------++ +------------+---------++-----------++ RIGHT ATRIUM         Index       +------------+---------++-----------++ RA Area:    16.30 cm            +------------+---------++-----------++ RA Volume:  45.20 ml 21.32 ml/m +------------+---------++-----------++ +------------+-----------++ AORTIC VALVE            +------------+-----------++ LVOT Vmax:  90.70 cm/s  +------------+-----------++ LVOT Vmean: 56.400 cm/s +------------+-----------++ LVOT VTI:   0.204 m     +------------+-----------++ AR PHT:     857 msec    +------------+-----------++  +-------------+-------++ AORTA                +-------------+-------++ Ao Root diam:3.90 cm +-------------+-------++ Ao Asc diam:  3.90 cm +-------------+-------++  +--------------+-----------++ +---------------+-----------++ MITRAL VALVE              TRICUSPID VALVE            +--------------+-----------++ +---------------+-----------++                           TR Peak grad:  28.1 mmHg   +--------------+-----------++ +---------------+-----------++ MV Peak grad: 2.0 mmHg    TR Vmax:  265.00 cm/s +--------------+-----------++ +---------------+-----------++ MV Mean grad: 1.0 mmHg    +--------------+-----------++ +--------------+-------+ MV Vmax:      0.70 m/s    SHUNTS                +--------------+-----------++ +--------------+-------+ MV Vmean:     31.8 cm/s   Systemic VTI: 0.20 m  +--------------+-----------++ +--------------+-------+ MV VTI:       0.15 m      Systemic Diam:2.00 cm +--------------+-----------++ +--------------+-------+ MV PHT:       139.78 msec +--------------+-----------++ MV Decel Time:482 msec    +--------------+-----------++ +--------------+----------++ MV E velocity:34.50 cm/s +--------------+----------++ MV A velocity:55.80 cm/s +--------------+----------++ MV E/A ratio: 0.62       +--------------+----------++   Jodelle Red MD Electronically signed by Jodelle Red MD Signature Date/Time: 08/28/2018/7:08:13 PM    Final              EKG:  EKG is  ordered today.  The ekg ordered today demonstrates normal sinus rhythm, rate 65 bpm  Recent Labs: No results found for requested labs within last 365 days.  Recent Lipid Panel    Component Value Date/Time   CHOL 169 03/18/2021 0806   TRIG 98 03/18/2021 0806   HDL 60 03/18/2021 0806   CHOLHDL 2.8 03/18/2021 0806   VLDL 31 (H) 03/13/2015 0816   LDLCALC 90 03/18/2021 0806    Risk Assessment/Calculations:   CHA2DS2-VASc Score = 3   This indicates a 3.2% annual risk of stroke. The patient's score is based upon: CHF History: 0 HTN  History: 1 Diabetes History: 0 Stroke History: 0 Vascular Disease History: 0 Age Score: 2 Gender Score: 0   Home Medications   Current Meds  Medication Sig   acetaminophen (TYLENOL) 500 MG tablet Take 500 mg by mouth every 6 (six) hours as needed for moderate pain.   albuterol (PROAIR HFA) 108 (90 BASE) MCG/ACT inhaler Inhale 2 puffs into the lungs every 4 (four) hours as needed.   apixaban (ELIQUIS) 5 MG TABS tablet Take 1 tablet (5 mg total) by mouth 2 (two) times daily.   atorvastatin (LIPITOR) 40 MG tablet TAKE 1 TABLET(40 MG) BY MOUTH DAILY   clonazePAM (KLONOPIN) 0.5 MG tablet TAKE 1 TABLET(0.5 MG) BY MOUTH TWICE DAILY AS NEEDED FOR ANXIETY   cyclobenzaprine (FLEXERIL) 10 MG tablet Take 1 tablet (10 mg total) by mouth 3 (three) times daily as needed for muscle spasms.   doxycycline (VIBRAMYCIN) 50 MG capsule Take 50 mg by mouth daily. Pt directed to take for 10 day.   fluticasone (FLONASE) 50 MCG/ACT nasal spray instill two SPRAYS in each nostril daily AT night   loratadine (CLARITIN) 10 MG tablet Take 1 tablet (10 mg total) by mouth daily.   Melatonin 10 MG TABS Take 10 mg by mouth at bedtime as needed (sleep).   metoprolol succinate (TOPROL-XL) 25 MG 24 hr tablet TAKE ONE TABLET BY MOUTH EVERY EVENING   pantoprazole (PROTONIX) 40 MG tablet TAKE 1 TABLET(40 MG) BY MOUTH TWICE DAILY   traMADol (ULTRAM) 50 MG tablet Take 1 tablet (50 mg total) by mouth at bedtime as needed for moderate pain.   venlafaxine (EFFEXOR) 25 MG tablet Take 25 mg by mouth 2 (two) times daily.     Review of Systems     All other systems reviewed and are otherwise negative except as noted above.  Physical Exam    VS:  BP 112/68   Pulse 65   Ht 5\' 9"  (1.753 m)   Wt  199 lb 9.6 oz (90.5 kg)   SpO2 96%   BMI 29.48 kg/m  , BMI Body mass index is 29.48 kg/m.  Wt Readings from Last 3 Encounters:  10/10/22 199 lb 9.6 oz (90.5 kg)  05/24/22 206 lb (93.4 kg)  10/07/21 203 lb (92.1 kg)     GEN: Well  nourished, well developed, in no acute distress. HEENT: normal. Neck: Supple, no JVD, carotid bruits, or masses. Cardiac: RRR, no murmurs, rubs, or gallops. No clubbing, cyanosis, edema.  Radials/PT 2+ and equal bilaterally.  Respiratory:  Respirations regular and unlabored, clear to auscultation bilaterally. GI: Soft, nontender, nondistended. MS: No deformity or atrophy. Skin: Warm and dry, no rash. Neuro:  Strength and sensation are intact. Psych: Normal affect.  Assessment & Plan    Paroxysmal atrial fibrillation -Normal sinus rhythm today -Continue Eliquis 5 mg twice daily, will provide samples today -Continue metoprolol succinate 25 mg daily -Asymptomatic at this time  Hypertension -Blood pressure is well-controlled 112/68 -Continue current medications which include Eliquis 5 mg twice a day, Lipitor 40 mg daily, metoprolol succinate 25 mg daily -Continue heart healthy, low-sodium diet -Continue to track blood pressure daily  Hyperlipidemia -Last LDL 90, at goal -Triglycerides 98 -Would suggest updating lipid panel LFTs with primary in a few months -Continue Lipitor 40 mg daily  Chronic stiffness/asthma -he has a pulmonologist -no wheezing on exam today -continue current medications -will update echo for DOE         Disposition: Follow up 1 year with Lance Muss, MD or APP.  Signed, Sharlene Dory, PA-C 10/10/2022, 2:32 PM [facility]

## 2022-10-10 NOTE — Patient Instructions (Addendum)
Medication Instructions:  Your physician recommends that you continue on your current medications as directed. Please refer to the Current Medication list given to you today.  *If you need a refill on your cardiac medications before your next appointment, please call your pharmacy*  Lab Work: Have your primary care provider fax [place] a copy of your lipid's and lft's when you have them drawn. Fax:[phone]. If you have labs (blood work) drawn today and your tests are completely normal, you will receive your results only by: MyChart Message (if you have MyChart) OR A paper copy in the mail If you have any lab test that is abnormal or we need to change your treatment, we will call you to review the results.  Testing/Procedures: Your physician has requested that you have an echocardiogram. Echocardiography is a painless test that uses sound waves to create images of your heart. It provides your doctor with information about the size and shape of your heart and how well your heart's chambers and valves are working. This procedure takes approximately one hour. There are no restrictions for this procedure. Please do NOT wear cologne, perfume, aftershave, or lotions (deodorant is allowed). Please arrive 15 minutes prior to your appointment time.    Follow-Up: At [facility], you and your health needs are our priority.  As part of our continuing mission to provide you with exceptional heart care, we have created designated Provider Care Teams.  These Care Teams include your primary Cardiologist (physician) and Advanced Practice Providers (APPs -  Physician Assistants and Nurse Practitioners) who all work together to provide you with the care you need, when you need it.  We recommend signing up for the patient portal called "MyChart".  Sign up information is provided on this After Visit Summary.  MyChart is used to connect with patients for Virtual Visits (Telemedicine).  Patients are able to  view lab/test results, encounter notes, upcoming appointments, etc.  Non-urgent messages can be sent to your provider as well.   To learn more about what you can do with MyChart, go to [email or web address].    Your next appointment:   1 year(s)  Provider:   Lance Muss, MD  Heart-Healthy Eating Plan Many factors influence your heart health, including eating and exercise habits. Heart health is also called coronary health. Coronary risk increases with abnormal blood fat (lipid) levels. A heart-healthy eating plan includes limiting unhealthy fats, increasing healthy fats, limiting salt (sodium) intake, and making other diet and lifestyle changes. What is my plan? Your health care provider may recommend that: You limit your fat intake to _________% or less of your total calories each day. You limit your saturated fat intake to _________% or less of your total calories each day. You limit the amount of cholesterol in your diet to less than _________ mg per day. You limit the amount of sodium in your diet to less than _________ mg per day. What are tips for following this plan? Cooking Cook foods using methods other than frying. Baking, boiling, grilling, and broiling are all good options. Other ways to reduce fat include: Removing the skin from poultry. Removing all visible fats from meats. Steaming vegetables in water or broth. Meal planning  At meals, imagine dividing your plate into fourths: Fill one-half of your plate with vegetables and green salads. Fill one-fourth of your plate with whole grains. Fill one-fourth of your plate with lean protein foods. Eat 2-4 cups of vegetables per day. One cup of vegetables equals 1 cup (  91 g) broccoli or cauliflower florets, 2 medium carrots, 1 large bell pepper, 1 large sweet potato, 1 large tomato, 1 medium white potato, 2 cups (150 g) raw leafy greens. Eat 1-2 cups of fruit per day. One cup of fruit equals 1 small apple, 1 large  banana, 1 cup (237 g) mixed fruit, 1 large orange,  cup (82 g) dried fruit, 1 cup (240 mL) 100% fruit juice. Eat more foods that contain soluble fiber. Examples include apples, broccoli, carrots, beans, peas, and barley. Aim to get 25-30 g of fiber per day. Increase your consumption of legumes, nuts, and seeds to 4-5 servings per week. One serving of dried beans or legumes equals  cup (90 g) cooked, 1 serving of nuts is  oz (12 almonds, 24 pistachios, or 7 walnut halves), and 1 serving of seeds equals  oz (8 g). Fats Choose healthy fats more often. Choose monounsaturated and polyunsaturated fats, such as olive and canola oils, avocado oil, flaxseeds, walnuts, almonds, and seeds. Eat more omega-3 fats. Choose salmon, mackerel, sardines, tuna, flaxseed oil, and ground flaxseeds. Aim to eat fish at least 2 times each week. Check food labels carefully to identify foods with trans fats or high amounts of saturated fat. Limit saturated fats. These are found in animal products, such as meats, butter, and cream. Plant sources of saturated fats include palm oil, palm kernel oil, and coconut oil. Avoid foods with partially hydrogenated oils in them. These contain trans fats. Examples are stick margarine, some tub margarines, cookies, crackers, and other baked goods. Avoid fried foods. General information Eat more home-cooked food and less restaurant, buffet, and fast food. Limit or avoid alcohol. Limit foods that are high in added sugar and simple starches such as foods made using white refined flour (white breads, pastries, sweets). Lose weight if you are overweight. Losing just 5-10% of your body weight can help your overall health and prevent diseases such as diabetes and heart disease. Monitor your sodium intake, especially if you have high blood pressure. Talk with your health care provider about your sodium intake. Try to incorporate more vegetarian meals weekly. What foods should I  eat? Fruits All fresh, canned (in natural juice), or frozen fruits. Vegetables Fresh or frozen vegetables (raw, steamed, roasted, or grilled). Green salads. Grains Most grains. Choose whole wheat and whole grains most of the time. Rice and pasta, including brown rice and pastas made with whole wheat. Meats and other proteins Lean, well-trimmed beef, veal, pork, and lamb. Chicken and [place] without skin. All fish and shellfish. Wild duck, rabbit, pheasant, and venison. Egg whites or low-cholesterol egg substitutes. Dried beans, peas, lentils, and tofu. Seeds and most nuts. Dairy Low-fat or nonfat cheeses, including ricotta and mozzarella. Skim or 1% milk (liquid, powdered, or evaporated). Buttermilk made with low-fat milk. Nonfat or low-fat yogurt. Fats and oils Non-hydrogenated (trans-free) margarines. Vegetable oils, including soybean, sesame, sunflower, olive, avocado, peanut, safflower, corn, canola, and cottonseed. Salad dressings or mayonnaise made with a vegetable oil. Beverages Water (mineral or sparkling). Coffee and tea. Unsweetened ice tea. Diet beverages. Sweets and desserts Sherbet, gelatin, and fruit ice. Small amounts of dark chocolate. Limit all sweets and desserts. Seasonings and condiments All seasonings and condiments. The items listed above may not be a complete list of foods and beverages you can eat. Contact a dietitian for more options. What foods should I avoid? Fruits Canned fruit in heavy syrup. Fruit in cream or butter sauce. Fried fruit. Limit coconut. Vegetables Vegetables cooked in  cheese, cream, or butter sauce. Fried vegetables. Grains Breads made with saturated or trans fats, oils, or whole milk. Croissants. Sweet rolls. Donuts. High-fat crackers, such as cheese crackers and chips. Meats and other proteins Fatty meats, such as hot dogs, ribs, sausage, bacon, rib-eye roast or steak. High-fat deli meats, such as salami and bologna. Caviar. Domestic duck and  goose. Organ meats, such as liver. Dairy Cream, sour cream, cream cheese, and creamed cottage cheese. Whole-milk cheeses. Whole or 2% milk (liquid, evaporated, or condensed). Whole buttermilk. Cream sauce or high-fat cheese sauce. Whole-milk yogurt. Fats and oils Meat fat, or shortening. Cocoa butter, hydrogenated oils, palm oil, coconut oil, palm kernel oil. Solid fats and shortenings, including bacon fat, salt pork, lard, and butter. Nondairy cream substitutes. Salad dressings with cheese or sour cream. Beverages Regular sodas and any drinks with added sugar. Sweets and desserts Frosting. Pudding. Cookies. Cakes. Pies. Milk chocolate or white chocolate. Buttered syrups. Full-fat ice cream or ice cream drinks. The items listed above may not be a complete list of foods and beverages to avoid. Contact a dietitian for more information. Summary Heart-healthy meal planning includes limiting unhealthy fats, increasing healthy fats, limiting salt (sodium) intake and making other diet and lifestyle changes. Lose weight if you are overweight. Losing just 5-10% of your body weight can help your overall health and prevent diseases such as diabetes and heart disease. Focus on eating a balance of foods, including fruits and vegetables, low-fat or nonfat dairy, lean protein, nuts and legumes, whole grains, and heart-healthy oils and fats. This information is not intended to replace advice given to you by your health care provider. Make sure you discuss any questions you have with your health care provider. Document Revised: 05/31/2021 Document Reviewed: 05/31/2021 Elsevier Patient Education  2024 Else[employer] Inc.  Low-Sodium Eating Plan Salt (sodium) helps you keep a healthy balance of fluids in your body. Too much sodium can raise your blood pressure. It can also cause fluid and waste to be held in your body. Your health care provider or dietitian may recommend a low-sodium eating plan if you have high blood  pressure (hypertension), kidney disease, liver disease, or heart failure. Eating less sodium can help lower your blood pressure and reduce swelling. It can also protect your heart, liver, and kidneys. What are tips for following this plan? Reading food labels  Check food labels for the amount of sodium per serving. If you eat more than one serving, you must multiply the listed amount by the number of servings. Choose foods with less than 140 milligrams (mg) of sodium per serving. Avoid foods with 300 mg of sodium or more per serving. Always check how much sodium is in a product, even if the label says "unsalted" or "no salt added." Shopping  Buy products labeled as "low-sodium" or "no salt added." Buy fresh foods. Avoid canned foods and pre-made or frozen meals. Avoid canned, cured, or processed meats. Buy breads that have less than 80 mg of sodium per slice. Cooking  Eat more home-cooked food. Try to eat less restaurant, buffet, and fast food. Try not to add salt when you cook. Use salt-free seasonings or herbs instead of table salt or sea salt. Check with your provider or pharmacist before using salt substitutes. Cook with plant-based oils, such as canola, sunflower, or olive oil. Meal planning When eating at a restaurant, ask if your food can be made with less salt or no salt. Avoid dishes labeled as brined, pickled, cured, or smoked.  Avoid dishes made with soy sauce, miso, or teriyaki sauce. Avoid foods that have monosodium glutamate (MSG) in them. MSG may be added to some restaurant food, sauces, soups, bouillon, and canned foods. Make meals that can be grilled, baked, poached, roasted, or steamed. These are often made with less sodium. General information Try to limit your sodium intake to 1,500-2,300 mg each day, or the amount told by your provider. What foods should I eat? Fruits Fresh, frozen, or canned fruit. Fruit juice. Vegetables Fresh or frozen vegetables. "No salt added"  canned vegetables. "No salt added" tomato sauce and paste. Low-sodium or reduced-sodium tomato and vegetable juice. Grains Low-sodium cereals, such as oats, puffed wheat and rice, and shredded wheat. Low-sodium crackers. Unsalted rice. Unsalted pasta. Low-sodium bread. Whole grain breads and whole grain pasta. Meats and other proteins Fresh or frozen meat, poultry, seafood, and fish. These should have no added salt. Low-sodium canned tuna and salmon. Unsalted nuts. Dried peas, beans, and lentils without added salt. Unsalted canned beans. Eggs. Unsalted nut butters. Dairy Milk. Soy milk. Cheese that is naturally low in sodium, such as ricotta cheese, fresh mozzarella, or Swiss cheese. Low-sodium or reduced-sodium cheese. Cream cheese. Yogurt. Seasonings and condiments Fresh and dried herbs and spices. Salt-free seasonings. Low-sodium mustard and ketchup. Sodium-free salad dressing. Sodium-free light mayonnaise. Fresh or refrigerated horseradish. Lemon juice. Vinegar. Other foods Homemade, reduced-sodium, or low-sodium soups. Unsalted popcorn and pretzels. Low-salt or salt-free chips. The items listed above may not be all the foods and drinks you can have. Talk to a dietitian to learn more. What foods should I avoid? Vegetables Sauerkraut, pickled vegetables, and relishes. Olives. [place] fries. Onion rings. Regular canned vegetables, except low-sodium or reduced-sodium items. Regular canned tomato sauce and paste. Regular tomato and vegetable juice. Frozen vegetables in sauces. Grains Instant hot cereals. Bread stuffing, pancake, and biscuit mixes. Croutons. Seasoned rice or pasta mixes. Noodle soup cups. Boxed or frozen macaroni and cheese. Regular salted crackers. Self-rising flour. Meats and other proteins Meat or fish that is salted, canned, smoked, spiced, or pickled. Precooked or cured meat, such as sausages or meat loaves. Tomasa Blase. Ham. Pepperoni. Hot dogs. Corned beef. Chipped beef. Salt pork.  Jerky. Pickled herring, anchovies, and sardines. Regular canned tuna. Salted nuts. Dairy Processed cheese and cheese spreads. Hard cheeses. Cheese curds. Blue cheese. Feta cheese. String cheese. Regular cottage cheese. Buttermilk. Canned milk. Fats and oils Salted butter. Regular margarine. Ghee. Bacon fat. Seasonings and condiments Onion salt, garlic salt, seasoned salt, table salt, and sea salt. Canned and packaged gravies. Worcestershire sauce. Tartar sauce. Barbecue sauce. Teriyaki sauce. Soy sauce, including reduced-sodium soy sauce. Steak sauce. Fish sauce. Oyster sauce. Cocktail sauce. Horseradish that you find on the shelf. Regular ketchup and mustard. Meat flavorings and tenderizers. Bouillon cubes. Hot sauce. Pre-made or packaged marinades. Pre-made or packaged taco seasonings. Relishes. Regular salad dressings. Salsa. Other foods Salted popcorn and pretzels. Corn chips and puffs. Potato and tortilla chips. Canned or dried soups. Pizza. Frozen entrees and pot pies. The items listed above may not be all the foods and drinks you should avoid. Talk to a dietitian to learn more. This information is not intended to replace advice given to you by your health care provider. Make sure you discuss any questions you have with your health care provider. Document Revised: 05/12/2022 Document Reviewed: 05/12/2022 Elsevier Patient Education  2024 [employer].

## 2022-10-17 DIAGNOSIS — H353221 Exudative age-related macular degeneration, left eye, with active choroidal neovascularization: Secondary | ICD-10-CM | POA: Diagnosis not present

## 2022-10-20 ENCOUNTER — Other Ambulatory Visit: Payer: PPO

## 2022-10-20 DIAGNOSIS — I700 Atherosclerosis of aorta: Secondary | ICD-10-CM | POA: Diagnosis not present

## 2022-10-20 DIAGNOSIS — E7800 Pure hypercholesterolemia, unspecified: Secondary | ICD-10-CM

## 2022-10-20 DIAGNOSIS — I480 Paroxysmal atrial fibrillation: Secondary | ICD-10-CM | POA: Diagnosis not present

## 2022-10-20 DIAGNOSIS — I10 Essential (primary) hypertension: Secondary | ICD-10-CM

## 2022-10-21 LAB — HEPATIC FUNCTION PANEL
AG Ratio: 1.2 (calc) (ref 1.0–2.5)
ALT: 16 U/L (ref 9–46)
AST: 25 U/L (ref 10–35)
Albumin: 3.5 g/dL — ABNORMAL LOW (ref 3.6–5.1)
Alkaline phosphatase (APISO): 74 U/L (ref 35–144)
Bilirubin, Direct: 0.1 mg/dL (ref 0.0–0.2)
Globulin: 3 g/dL (calc) (ref 1.9–3.7)
Indirect Bilirubin: 0.3 mg/dL (calc) (ref 0.2–1.2)
Total Bilirubin: 0.4 mg/dL (ref 0.2–1.2)
Total Protein: 6.5 g/dL (ref 6.1–8.1)

## 2022-10-21 LAB — COMPLETE METABOLIC PANEL WITH GFR
AG Ratio: 1.2 (calc) (ref 1.0–2.5)
ALT: 16 U/L (ref 9–46)
AST: 25 U/L (ref 10–35)
Albumin: 3.5 g/dL — ABNORMAL LOW (ref 3.6–5.1)
Alkaline phosphatase (APISO): 74 U/L (ref 35–144)
BUN: 18 mg/dL (ref 7–25)
CO2: 26 mmol/L (ref 20–32)
Calcium: 8.7 mg/dL (ref 8.6–10.3)
Chloride: 101 mmol/L (ref 98–110)
Creat: 0.82 mg/dL (ref 0.70–1.22)
Globulin: 3 g/dL (calc) (ref 1.9–3.7)
Glucose, Bld: 88 mg/dL (ref 65–99)
Potassium: 3.9 mmol/L (ref 3.5–5.3)
Sodium: 136 mmol/L (ref 135–146)
Total Bilirubin: 0.4 mg/dL (ref 0.2–1.2)
Total Protein: 6.5 g/dL (ref 6.1–8.1)
eGFR: 88 mL/min/{1.73_m2} (ref >=60)

## 2022-10-21 LAB — LIPID PANEL
Cholesterol: 149 mg/dL (ref <200)
HDL: 54 mg/dL (ref >=40)
LDL Cholesterol (Calc): 69 mg/dL (calc)
Non-HDL Cholesterol (Calc): 95 mg/dL (calc) (ref <130)
Total CHOL/HDL Ratio: 2.8 (calc) (ref <5.0)
Triglycerides: 183 mg/dL — ABNORMAL HIGH (ref <150)

## 2022-10-21 LAB — CBC WITH DIFFERENTIAL/PLATELET
Absolute Monocytes: 924 cells/uL (ref 200–950)
Basophils Absolute: 49 cells/uL (ref 0–200)
Basophils Relative: 0.7 %
Eosinophils Absolute: 98 cells/uL (ref 15–500)
Eosinophils Relative: 1.4 %
HCT: 37.2 % — ABNORMAL LOW (ref 38.5–50.0)
Hemoglobin: 12.5 g/dL — ABNORMAL LOW (ref 13.2–17.1)
Lymphs Abs: 1372 cells/uL (ref 850–3900)
MCH: 31.6 pg (ref 27.0–33.0)
MCHC: 33.6 g/dL (ref 32.0–36.0)
MCV: 93.9 fL (ref 80.0–100.0)
MPV: 10.2 fL (ref 7.5–12.5)
Monocytes Relative: 13.2 %
Neutro Abs: 4557 cells/uL (ref 1500–7800)
Neutrophils Relative %: 65.1 %
Platelets: 263 10*3/uL (ref 140–400)
RBC: 3.96 10*6/uL — ABNORMAL LOW (ref 4.20–5.80)
RDW: 12.1 % (ref 11.0–15.0)
Total Lymphocyte: 19.6 %
WBC: 7 10*3/uL (ref 3.8–10.8)

## 2022-10-24 DIAGNOSIS — H353211 Exudative age-related macular degeneration, right eye, with active choroidal neovascularization: Secondary | ICD-10-CM | POA: Diagnosis not present

## 2022-10-28 ENCOUNTER — Ambulatory Visit: Payer: PPO | Admitting: Internal Medicine

## 2022-10-28 ENCOUNTER — Encounter: Payer: Self-pay | Admitting: Internal Medicine

## 2022-10-28 VITALS — BP 134/70 | HR 63 | Temp 97.9°F | Ht 69.0 in | Wt 201.6 lb

## 2022-10-28 DIAGNOSIS — J4530 Mild persistent asthma, uncomplicated: Secondary | ICD-10-CM

## 2022-10-28 DIAGNOSIS — K2100 Gastro-esophageal reflux disease with esophagitis, without bleeding: Secondary | ICD-10-CM | POA: Diagnosis not present

## 2022-10-28 DIAGNOSIS — J301 Allergic rhinitis due to pollen: Secondary | ICD-10-CM | POA: Diagnosis not present

## 2022-10-28 MED ORDER — DULERA 100-5 MCG/ACT IN AERO: 2.0000 | INHALATION_SPRAY | Freq: Two times a day (BID) | RESPIRATORY_TRACT | 3 refills | Status: DC

## 2022-10-28 NOTE — Progress Notes (Deleted)
Tyler Allison    [id number]    August 30, 1940  Primary Care Physician:Pickard, Priscille Heidelberg, MD Date of Appointment: 10/28/2022 Established Patient Visit  Chief complaint:   Chief Complaint  Patient presents with   Acute Visit    Increased SOB with exertion      HPI: Tyler Allison is a 82 y.o. man with asthma and sleep apnea. Follows with Dr. Maple Hudson  Interval Updates: Here for acute visit. Last seen over a year ago by [employer].   Having wrosenin     I have reviewed the patient's family social and past medical history and updated as appropriate.   Past Medical History:  Diagnosis Date   Allergy    Anxiety    Asthma    Cancer (HCC)    prostate cancer   Cervical spondylosis with radiculopathy    left c6 radiculopathy   Chronic back pain    GERD (gastroesophageal reflux disease)    Hyperlipidemia    Hypertension    PAF (paroxysmal atrial fibrillation) (HCC)    Prostate cancer (HCC)     Past Surgical History:  Procedure Laterality Date   BACK SURGERY     MICROLARYNGOSCOPY W/VOCAL CORD INJECTION N/A 10/30/2020   Procedure: MICRODIRECT LARYNGOSCOPY WITH VOCAL CORD INJECTION AND CO2 LASER APPLICATION;  Surgeon: Christia Reading, MD;  Location: [facility] OR;  Service: ENT;  Laterality: N/A;   PROSTATE BIOPSY     SPINE SURGERY     back surgery x 3 including lumbar fusion, neck surgery x 2 including C4-6 ACDF   tympanoplasty with mastoidectomy Left 01/13/2004    Family History  Problem Relation Age of Onset   Heart failure Mother    Esophageal cancer Father    Heart attack Maternal Uncle    Breast cancer Neg Hx    Prostate cancer Neg Hx    Pancreatic cancer Neg Hx    Colon cancer Neg Hx     Social History   Occupational History    Comment: retired  Tobacco Use   Smoking status: Former    Packs/day: 0.50    Years: 13.00    Additional pack years: 0.00    Total pack years: 6.50    Types: Cigarettes    Start date: 43    Quit date: 10/23/1968    Years  since quitting: 54.0   Smokeless tobacco: Never  Vaping Use   Vaping Use: Never used  Substance and Sexual Activity   Alcohol use: No   Drug use: No   Sexual activity: Not Currently    Comment: married, retired.     Physical Exam: Blood pressure 134/70, pulse 63, temperature 97.9 F (36.6 C), temperature source Oral, height 5\' 9"  (1.753 m), weight 201 lb 9.6 oz (91.4 kg), SpO2 97 %.  Gen:      No acute distress ENT:  ***no nasal polyps, mucus membranes moist Lungs:    No increased respiratory effort, symmetric chest wall excursion, clear to auscultation bilaterally, ***no wheezes or crackles CV:         Regular rate and rhythm; no murmurs, rubs, or gallops.  No pedal edema   Data Reviewed: Imaging: I have personally reviewed the ***  PFTs:     Latest Ref Rng & Units 01/15/2019    3:50 PM  PFT Results  FVC-Pre L 3.39   FVC-Predicted Pre % 83   FVC-Post L 3.43   FVC-Predicted Post % 84   Pre FEV1/FVC % %  72   Post FEV1/FCV % % 75   FEV1-Pre L 2.45   FEV1-Predicted Pre % 84   FEV1-Post L 2.56   DLCO uncorrected ml/min/mmHg 23.65   DLCO UNC% % 96   DLCO corrected ml/min/mmHg 23.85   DLCO COR %Predicted % 97   DLVA Predicted % 111   TLC L 6.20   TLC % Predicted % 87   RV % Predicted % 93    I have personally reviewed the patient's PFTs and ***  Labs:  Immunization status: Immunization History  Administered Date(s) Administered   Fluad Quad(high Dose 65+) 12/28/2018, 01/30/2020, 03/03/2022   Influenza Whole 02/09/2010   Influenza, High Dose Seasonal PF 02/15/2017, 02/01/2018   Influenza,inj,Quad PF,6+ Mos 02/13/2013, 02/25/2014, 03/13/2015, 02/11/2016, 01/19/2021   Influenza,inj,quad, With Preservative 02/06/2018   Influenza-Unspecified 03/19/2021   PFIZER(Purple Top)SARS-COV-2 Vaccination 06/20/2019, 07/13/2019, 05/11/2020   Pneumococcal Conjugate-13 03/14/2014   Pneumococcal Polysaccharide-23 05/09/2004, 12/05/2017   Td 01/07/2005   Zoster, Live 08/08/2006     External Records Personally Reviewed: ***  Assessment:  ***  Plan/Recommendations: ***   I spent *** minutes on 10/28/2022 in care of this patient including face to face time and non-face to face time spent charting, review of outside records, and coordination of care. 83151 10-19 minutes or Straightforward MDM 76160 20-29 minutes or Low level MDM 73710 30-39 minutes or Moderate level MDM [id number]  40-54 minutes or High level MDM    Return to Care: No follow-ups on file.   Durel Salts, MD Pulmonary and Critical Care Medicine [facility] [phone]

## 2022-10-28 NOTE — Progress Notes (Signed)
Tyler Allison    [id number]    1940-06-11  Primary Care Physician:Pickard, Priscille Heidelberg, MD Date of Appointment: 10/28/2022 Established Patient Visit  Chief complaint:   Chief Complaint  Patient presents with   Acute Visit    Increased SOB with exertion      HPI: Tyler Allison is a 82 y.o. man with asthma and central sleep apnea.   Interval Updates: Here for acute visit. Several months worsening dyspnea with exertion.   He has an albuterol rescue inhaler. He is taking this more frequently. Taking about once/daily now.   Triggers - heat and cold.   Has tried other inhalers without improvement in the past.   I have reviewed the patient's family social and past medical history and updated as appropriate.   Past Medical History:  Diagnosis Date   Allergy    Anxiety    Asthma    Cancer (HCC)    prostate cancer   Cervical spondylosis with radiculopathy    left c6 radiculopathy   Chronic back pain    GERD (gastroesophageal reflux disease)    Hyperlipidemia    Hypertension    PAF (paroxysmal atrial fibrillation) (HCC)    Prostate cancer (HCC)     Past Surgical History:  Procedure Laterality Date   BACK SURGERY     MICROLARYNGOSCOPY W/VOCAL CORD INJECTION N/A 10/30/2020   Procedure: MICRODIRECT LARYNGOSCOPY WITH VOCAL CORD INJECTION AND CO2 LASER APPLICATION;  Surgeon: Christia Reading, MD;  Location: [facility] OR;  Service: ENT;  Laterality: N/A;   PROSTATE BIOPSY     SPINE SURGERY     back surgery x 3 including lumbar fusion, neck surgery x 2 including C4-6 ACDF   tympanoplasty with mastoidectomy Left 01/13/2004    Family History  Problem Relation Age of Onset   Heart failure Mother    Esophageal cancer Father    Heart attack Maternal Uncle    Breast cancer Neg Hx    Prostate cancer Neg Hx    Pancreatic cancer Neg Hx    Colon cancer Neg Hx     Social History   Occupational History    Comment: retired  Tobacco Use   Smoking status: Former     Packs/day: 0.50    Years: 13.00    Additional pack years: 0.00    Total pack years: 6.50    Types: Cigarettes    Start date: 45    Quit date: 10/23/1968    Years since quitting: 54.0   Smokeless tobacco: Never  Vaping Use   Vaping Use: Never used  Substance and Sexual Activity   Alcohol use: No   Drug use: No   Sexual activity: Not Currently    Comment: married, retired.     Physical Exam: Blood pressure 134/70, pulse 63, temperature 97.9 F (36.6 C), temperature source Oral, height 5\' 9"  (1.753 m), weight 201 lb 9.6 oz (91.4 kg), SpO2 97 %.  Gen:      No acute distress Lungs:    No increased respiratory effort, symmetric chest wall excursion, clear to auscultation bilaterally, no wheezes or crackles CV:         Regular rate and rhythm; no murmurs, rubs, or gallops.  No pedal edema   Data Reviewed: Imaging: I have personally reviewed the chest xray April 2023 - no acute pulmonary process  PFTs:     Latest Ref Rng & Units 01/15/2019    3:50 PM  PFT Results  FVC-Pre L 3.39   FVC-Predicted Pre % 83   FVC-Post L 3.43   FVC-Predicted Post % 84   Pre FEV1/FVC % % 72   Post FEV1/FCV % % 75   FEV1-Pre L 2.45   FEV1-Predicted Pre % 84   FEV1-Post L 2.56   DLCO uncorrected ml/min/mmHg 23.65   DLCO UNC% % 96   DLCO corrected ml/min/mmHg 23.85   DLCO COR %Predicted % 97   DLVA Predicted % 111   TLC L 6.20   TLC % Predicted % 87   RV % Predicted % 93    I have personally reviewed the patient's PFTs and normal pulmonary function  Labs: Lab Results  Component Value Date   NA 136 10/20/2022   K 3.9 10/20/2022   CO2 26 10/20/2022   GLUCOSE 88 10/20/2022   BUN 18 10/20/2022   CREATININE 0.82 10/20/2022   CALCIUM 8.7 10/20/2022   GFR 74.56 07/03/2019   EGFR 88 10/20/2022   GFRNONAA >60 10/08/2020   Lab Results  Component Value Date   WBC 7.0 10/20/2022   HGB 12.5 (L) 10/20/2022   HCT 37.2 (L) 10/20/2022   MCV 93.9 10/20/2022   PLT 263 10/20/2022   Absolute  eosinophil count 98 Ige 181  Immunization status: Immunization History  Administered Date(s) Administered   Fluad Quad(high Dose 65+) 12/28/2018, 01/30/2020, 03/03/2022   Influenza Whole 02/09/2010   Influenza, High Dose Seasonal PF 02/15/2017, 02/01/2018   Influenza,inj,Quad PF,6+ Mos 02/13/2013, 02/25/2014, 03/13/2015, 02/11/2016, 01/19/2021   Influenza,inj,quad, With Preservative 02/06/2018   Influenza-Unspecified 03/19/2021   PFIZER(Purple Top)SARS-COV-2 Vaccination 06/20/2019, 07/13/2019, 05/11/2020   Pneumococcal Conjugate-13 03/14/2014   Pneumococcal Polysaccharide-23 05/09/2004, 12/05/2017   Td 01/07/2005   Zoster, Live 08/08/2006    External Records Personally Reviewed: pulmonary  Assessment:  Mild persistent asthma, not well controlled Allergic rhinitis, controlled GERD, controlled  Plan/Recommendations: Start dulera 100 steroid inhaler 2 puffs in the morning, 2 puffs at night, gargle after use.   Continue albuterol inhaler as needed.   Continue flonase and cetirizine for the allergies.   Continue reflux medication.    Return to Care: Return in about 3 months (around 01/28/2023).   Durel Salts, MD Pulmonary and Critical Care Medicine [facility] Office:[phone]

## 2022-10-28 NOTE — Patient Instructions (Signed)
Please schedule follow up scheduled with Dr Maple Hudson or Rhunette Croft  in 3 months.  If my schedule is not open yet, we will contact you with a reminder closer to that time. Please call [phone] if you haven't heard from [place] a month before.   Start dulera 100 steroid inhaler 2 puffs in the morning, 2 puffs at night, gargle after use.   Continue albuterol inhaler as needed.   Continue flonase and cetirizine for the allergies.   Continue reflux medication.

## 2022-10-31 ENCOUNTER — Other Ambulatory Visit (HOSPITAL_COMMUNITY): Payer: Self-pay

## 2022-10-31 ENCOUNTER — Telehealth: Payer: Self-pay

## 2022-10-31 ENCOUNTER — Telehealth: Payer: Self-pay | Admitting: Internal Medicine

## 2022-10-31 NOTE — Telephone Encounter (Signed)
Patient Advocate Encounter   Received notification from [employer] that prior authorization is required for [facility] 100-5MCG/ACT aerosol   Submitted: 10-31-2022 Key [id number]  Status is pending

## 2022-10-31 NOTE — Telephone Encounter (Signed)
Pt seen Dr. Celine Mans on Friday and was prescribed dulera. Pt states the pharmacy is asking for an authorization from the doctor, pls advise

## 2022-11-01 DIAGNOSIS — C4442 Squamous cell carcinoma of skin of scalp and neck: Secondary | ICD-10-CM | POA: Diagnosis not present

## 2022-11-01 NOTE — Telephone Encounter (Signed)
Pt called back to inform us PA has been denied and will like to see if we can prescribe him something else.

## 2022-11-02 NOTE — Telephone Encounter (Signed)
Patient Advocate Encounter  Received a fax from [employer] regarding Prior Authorization for [facility] 100-5MCG/ACT aerosol.   Key: [id number]  Authorization has been DENIED due to    Determination letter attached to patient chart

## 2022-11-03 NOTE — Telephone Encounter (Signed)
See other phone note dated today  This is a duplicate

## 2022-11-03 NOTE — Telephone Encounter (Signed)
I spoke with the pt and notified that we are working on getting him alternative to the [facility]   Dr Celine Mans- it looks like his insurance prefers Breo   Please advise, thanks!

## 2022-11-04 MED ORDER — FLUTICASONE FUROATE-VILANTEROL 200-25 MCG/ACT IN AEPB: 1.0000 | INHALATION_SPRAY | Freq: Every day | RESPIRATORY_TRACT | 5 refills | Status: DC

## 2022-11-04 NOTE — Telephone Encounter (Signed)
Called and spoke with patient.  Patient aware Tyler Allison was sent to pharmacy. Nothing further at this time.

## 2022-11-07 ENCOUNTER — Ambulatory Visit (HOSPITAL_COMMUNITY): Payer: PPO | Attending: Physician Assistant

## 2022-11-07 DIAGNOSIS — R0609 Other forms of dyspnea: Secondary | ICD-10-CM

## 2022-11-07 LAB — ECHOCARDIOGRAM COMPLETE
Area-P 1/2: 2.1 cm2
MV M vel: 5.06 m/s
MV Peak grad: 102.4 mmHg
P 1/2 time: 340 msec
Radius: 0.65 cm
S' Lateral: 2.8 cm

## 2022-11-08 ENCOUNTER — Ambulatory Visit (INDEPENDENT_AMBULATORY_CARE_PROVIDER_SITE_OTHER): Payer: PPO | Admitting: Family Medicine

## 2022-11-08 VITALS — BP 124/72 | HR 75 | Temp 98.0°F | Wt 202.6 lb

## 2022-11-08 DIAGNOSIS — R5383 Other fatigue: Secondary | ICD-10-CM | POA: Diagnosis not present

## 2022-11-08 NOTE — Progress Notes (Signed)
Subjective:    Patient ID: Tyler Allison, male    DOB: 12-14-1940, 82 y.o.   MRN: [id number]   Subjective:   Patient presents today complaining of severe fatigue.  He states for the last 2 months he has no energy.  He also reports getting short of breath more easily.  He recently saw his cardiologist who did an echocardiogram of his heart.  His ejection fraction was 65%.  He had mild elevations in his pulmonary artery pressure but otherwise his echocardiogram was normal.  There was no explanation for dyspnea on exertion.  He did recently see his pulmonologist who put him on Breo.  They did this because the patient was using his albuterol every day.  Therefore shortness of breath potentially could be contributing to his fatigue.  He also recently had blood work including a CBC a CMP and fasting lipid panel all which were within normal limits.  He did have some mild anemia with a hemoglobin in the 12.  However the anemia was not significant enough to explain fatigue.  Past medical history significant for prostate cancer.  Last year he was treated with hormone therapy to suppress testosterone levels.  I suspect that this is the most likely explanation for his fatigue.  However he states has been off the medication now for almost a year and the symptoms are not improving Past Medical History:  Diagnosis Date   Allergy    Anxiety    Asthma    Cancer (HCC)    prostate cancer   Cervical spondylosis with radiculopathy    left c6 radiculopathy   Chronic back pain    GERD (gastroesophageal reflux disease)    Hyperlipidemia    Hypertension    PAF (paroxysmal atrial fibrillation) (HCC)    Prostate cancer (HCC)    Past Surgical History:  Procedure Laterality Date   BACK SURGERY     MICROLARYNGOSCOPY W/VOCAL CORD INJECTION N/A 10/30/2020   Procedure: MICRODIRECT LARYNGOSCOPY WITH VOCAL CORD INJECTION AND CO2 LASER APPLICATION;  Surgeon: Christia Reading, MD;  Location: [facility] OR;  Service: ENT;   Laterality: N/A;   PROSTATE BIOPSY     SPINE SURGERY     back surgery x 3 including lumbar fusion, neck surgery x 2 including C4-6 ACDF   tympanoplasty with mastoidectomy Left 01/13/2004   Current Outpatient Medications on File Prior to Visit  Medication Sig Dispense Refill   acetaminophen (TYLENOL) 500 MG tablet Take 500 mg by mouth every 6 (six) hours as needed for moderate pain.     albuterol (PROAIR HFA) 108 (90 BASE) MCG/ACT inhaler Inhale 2 puffs into the lungs every 4 (four) hours as needed. (Patient not taking: Reported on 10/28/2022) 8.5 g 3   apixaban (ELIQUIS) 5 MG TABS tablet Take 1 tablet (5 mg total) by mouth 2 (two) times daily. 120 tablet 3   atorvastatin (LIPITOR) 40 MG tablet TAKE 1 TABLET(40 MG) BY MOUTH DAILY 90 tablet 3   clonazePAM (KLONOPIN) 0.5 MG tablet TAKE 1 TABLET(0.5 MG) BY MOUTH TWICE DAILY AS NEEDED FOR ANXIETY 60 tablet 1   cyclobenzaprine (FLEXERIL) 10 MG tablet Take 1 tablet (10 mg total) by mouth 3 (three) times daily as needed for muscle spasms. (Patient not taking: Reported on 10/28/2022) 30 tablet 0   fluticasone (FLONASE) 50 MCG/ACT nasal spray instill two SPRAYS in each nostril daily AT night 16 g 6   fluticasone furoate-vilanterol (BREO ELLIPTA) 200-25 MCG/ACT AEPB Inhale 1 puff into the lungs daily. 1  each 5   loratadine (CLARITIN) 10 MG tablet Take 1 tablet (10 mg total) by mouth daily. 30 tablet 5   Melatonin 10 MG TABS Take 10 mg by mouth at bedtime as needed (sleep).     metoprolol succinate (TOPROL-XL) 25 MG 24 hr tablet TAKE ONE TABLET BY MOUTH EVERY EVENING 90 tablet 3   pantoprazole (PROTONIX) 40 MG tablet TAKE 1 TABLET(40 MG) BY MOUTH TWICE DAILY 180 tablet 3   traMADol (ULTRAM) 50 MG tablet Take 1 tablet (50 mg total) by mouth at bedtime as needed for moderate pain. 30 tablet 0   venlafaxine (EFFEXOR) 25 MG tablet Take 25 mg by mouth 2 (two) times daily. (Patient not taking: Reported on 10/28/2022)     No current facility-administered  medications on file prior to visit.   Allergies  Allergen Reactions   Codeine Nausea And Vomiting   Social History   Socioeconomic History   Marital status: Married    Spouse name: Not on file   Number of children: 2   Years of education: Not on file   Highest education level: Not on file  Occupational History    Comment: retired  Tobacco Use   Smoking status: Former    Packs/day: 0.50    Years: 13.00    Additional pack years: 0.00    Total pack years: 6.50    Types: Cigarettes    Start date: 39    Quit date: 10/23/1968    Years since quitting: 54.0   Smokeless tobacco: Never  Vaping Use   Vaping Use: Never used  Substance and Sexual Activity   Alcohol use: No   Drug use: No   Sexual activity: Not Currently    Comment: married, retired.  Other Topics Concern   Not on file  Social History Narrative   2 children but 1 deceased   Social Determinants of Health   Financial Resource Strain: Low Risk  (02/18/2022)   Overall Financial Resource Strain (CARDIA)    Difficulty of Paying Living Expenses: Not very hard  Food Insecurity: No Food Insecurity (02/18/2022)   Hunger Vital Sign    Worried About Running Out of Food in the Last Year: Never true    Ran Out of Food in the Last Year: Never true  Transportation Needs: Unknown (02/18/2022)   PRAPARE - Administrator, Civil Service (Medical): No    Lack of Transportation (Non-Medical): Not on file  Physical Activity: Insufficiently Active (02/18/2022)   Exercise Vital Sign    Days of Exercise per Week: 1 day    Minutes of Exercise per Session: 20 min  Stress: No Stress Concern Present (02/18/2022)   [employer] of Occupational Health - Occupational Stress Questionnaire    Feeling of Stress : Not at all  Social Connections: Socially Integrated (02/18/2022)   Social Connection and Isolation Panel [NHANES]    Frequency of Communication with Friends and Family: Three times a week    Frequency of Social  Gatherings with Friends and Family: Three times a week    Attends Religious Services: 1 to 4 times per year    Active Member of Clubs or Organizations: No    Attends Banker Meetings: 1 to 4 times per year    Marital Status: Married  Catering manager Violence: Not At Risk (02/18/2022)   Humiliation, Afraid, Rape, and Kick questionnaire    Fear of Current or Ex-Partner: No    Emotionally Abused: No    Physically Abused:  No    Sexually Abused: No   Family History  Problem Relation Age of Onset   Heart failure Mother    Esophageal cancer Father    Heart attack Maternal Uncle    Breast cancer Neg Hx    Prostate cancer Neg Hx    Pancreatic cancer Neg Hx    Colon cancer Neg Hx     Review of Systems  All other systems reviewed and are negative.      Objective:   Physical Exam Vitals reviewed.  Constitutional:      General: He is not in acute distress.    Appearance: He is well-developed. He is not diaphoretic.  HENT:     Head: Normocephalic and atraumatic.     Right Ear: Tympanic membrane and ear canal normal.     Left Ear: Tympanic membrane and ear canal normal.     Nose: No congestion or rhinorrhea.     Mouth/Throat:     Mouth: Mucous membranes are moist.     Pharynx: Oropharynx is clear. No oropharyngeal exudate or posterior oropharyngeal erythema.  Eyes:     Conjunctiva/sclera: Conjunctivae normal.  Neck:     Thyroid: No thyromegaly.     Vascular: No JVD.     Trachea: No tracheal deviation.  Cardiovascular:     Rate and Rhythm: Normal rate and regular rhythm.     Heart sounds: Normal heart sounds. No murmur heard.    No friction rub. No gallop.  Pulmonary:     Effort: Pulmonary effort is normal. No respiratory distress.     Breath sounds: Normal breath sounds. No stridor. No wheezing or rales.  Chest:     Chest wall: No tenderness.  Neurological:     Mental Status: He is alert.     Motor: No abnormal muscle tone.         Assessment & Plan:   Fatigue, unspecified type - Plan: TSH, Cortisol, Testosterone Total,Free,Bio, Males, Vitamin B12, Fecal Globin By Immunochemistry There is a broad differential diagnosis.  I reviewed his past medical history of recent testing.  Given his slight drop in his hemoglobin I will check a stool for blood.  This will be a simple evaluation to determine if we need to do any screening for GI malignancy.  I will also check a TSH to evaluate for hypothyroidism.  I will check a cortisol level to evaluate for any evidence of adrenal insufficiency.  I will check a testosterone level which I suspect will be very low.  12 as well.  If his testosterone level is low, I would not recommend testosterone replacement given his history of prostate cancer.  However we could use this to help explain the patient's symptoms and provide him some answers.

## 2022-11-09 ENCOUNTER — Other Ambulatory Visit: Payer: Self-pay | Admitting: Family Medicine

## 2022-11-09 DIAGNOSIS — R5383 Other fatigue: Secondary | ICD-10-CM | POA: Diagnosis not present

## 2022-11-09 DIAGNOSIS — D649 Anemia, unspecified: Secondary | ICD-10-CM | POA: Diagnosis not present

## 2022-11-09 LAB — TESTOSTERONE TOTAL,FREE,BIO, MALES
Albumin: 3.8 g/dL (ref 3.6–5.1)
Sex Hormone Binding: 84 nmol/L — ABNORMAL HIGH (ref 22–77)
Testosterone: 77 ng/dL — ABNORMAL LOW (ref 250–827)

## 2022-11-09 LAB — TSH: TSH: 2.29 mIU/L (ref 0.40–4.50)

## 2022-11-09 LAB — VITAMIN B12: Vitamin B-12: 334 pg/mL (ref 200–1100)

## 2022-11-09 LAB — CORTISOL: Cortisol, Plasma: 6.8 ug/dL

## 2022-11-14 LAB — HOUSE ACCOUNT TRACKING

## 2022-11-14 LAB — FECAL GLOBIN BY IMMUNOCHEMISTRY
FECAL GLOBIN RESULT:: NOT DETECTED
MICRO NUMBER:: 15164681
SPECIMEN QUALITY:: ADEQUATE

## 2022-11-16 ENCOUNTER — Other Ambulatory Visit (HOSPITAL_COMMUNITY): Payer: Self-pay

## 2022-11-17 ENCOUNTER — Telehealth: Payer: Self-pay | Admitting: Internal Medicine

## 2022-11-17 NOTE — Telephone Encounter (Signed)
Refill for albuterol (PROAIR HFA) 108 (90 BASE) MCG/ACT inhaler    Pharmacy: [facility]

## 2022-11-21 ENCOUNTER — Other Ambulatory Visit: Payer: Self-pay

## 2022-11-21 ENCOUNTER — Other Ambulatory Visit: Payer: Self-pay | Admitting: Family Medicine

## 2022-11-21 MED ORDER — ALBUTEROL SULFATE HFA 108 (90 BASE) MCG/ACT IN AERS: 2.0000 | INHALATION_SPRAY | RESPIRATORY_TRACT | 11 refills | Status: DC | PRN

## 2022-11-21 NOTE — Telephone Encounter (Signed)
Requested Prescriptions  Pending Prescriptions Disp Refills   cyclobenzaprine (FLEXERIL) 10 MG tablet 30 tablet 0    Sig: Take 1 tablet (10 mg total) by mouth 3 (three) times daily as needed for muscle spasms.     Not Delegated - Analgesics:  Muscle Relaxants Failed - 11/21/2022 10:33 AM      Failed - This refill cannot be delegated      Failed - Valid encounter within last 6 months    Recent Outpatient Visits           1 year ago Constipation, unspecified constipation type   [facility] Medicine Pickard, Priscille Heidelberg, MD   1 year ago Strain of lumbar region, initial encounter   [facility] Family Medicine Pickard, Priscille Heidelberg, MD   1 year ago Encounter for Medicare annual wellness exam   [facility] Family Medicine Donita Brooks, MD   1 year ago Viral URI   [facility] Family Medicine Tanya Nones, Priscille Heidelberg, MD   1 year ago Insect bite of left knee, initial encounter   [facility] Family Medicine Valentino Nose, NP       Future Appointments             In 2 months Waymon Budge, MD [facility]  Pulmonary Care at [facility] Prescriptions Disp Refills   albuterol (PROAIR HFA) 108 (90 Base) MCG/ACT inhaler 8.5 g 3    Sig: Inhale 2 puffs into the lungs every 4 (four) hours as needed.     Pulmonology:  Beta Agonists 2 Failed - 11/21/2022 10:33 AM      Failed - Valid encounter within last 12 months    Recent Outpatient Visits           1 year ago Constipation, unspecified constipation type   [facility] Medicine Pickard, Priscille Heidelberg, MD   1 year ago Strain of lumbar region, initial encounter   [facility] Family Medicine Pickard, Priscille Heidelberg, MD   1 year ago Encounter for Medicare annual wellness exam   [facility] Family Medicine Donita Brooks, MD   1 year ago Viral URI   [facility] Family Medicine Tanya Nones, Priscille Heidelberg, MD   1 year ago Insect bite of left knee, initial encounter   [facility] Family Medicine Valentino Nose, NP       Future Appointments             In 2 months Waymon Budge, MD [facility] Pulmonary Care at [facility] - Last BP in normal range    BP Readings from Last 1 Encounters:  11/08/22 124/72         Passed - Last Heart Rate in normal range    Pulse Readings from Last 1 Encounters:  11/08/22 75

## 2022-11-21 NOTE — Telephone Encounter (Signed)
Breo Ellipta sent to [facility] on 11/04/22.

## 2022-11-21 NOTE — Telephone Encounter (Signed)
Prescription Request  11/21/2022  LOV: 11/08/2022  What is the name of the medication or equipment?   albuterol (PROAIR HFA) 108 (90 BASE) MCG/ACT inhaler [[id number]]   cyclobenzaprine (FLEXERIL) 10 MG tablet [[id number]]  **Pulmonologist added Breo Elipta Inhaler to supplement ProAir; patient stated it's helping. He's requesting financial assistance with meds.**  Have you contacted your pharmacy to request a refill? Yes   Which pharmacy would you like this sent to?    [facility] - [place], [place] - [address], [place] [address] Phone: [phone]  Fax: [phone]   Patient notified that their request is being sent to the clinical staff for review and that they should receive a response within 2 business days.   Please advise patient at [phone].

## 2022-11-21 NOTE — Telephone Encounter (Signed)
Spoke with patient. Refills have been sent for the pro air inhaler. NFN

## 2022-11-21 NOTE — Telephone Encounter (Signed)
Requested medications are due for refill today.  yes  Requested medications are on the active medications list.  yes  Last refill. 09/27/2021 #30 0 rf  Future visit scheduled.   no  Notes to clinic.  Refill not delegated.    Requested Prescriptions  Pending Prescriptions Disp Refills   cyclobenzaprine (FLEXERIL) 10 MG tablet 30 tablet 0    Sig: Take 1 tablet (10 mg total) by mouth 3 (three) times daily as needed for muscle spasms.     Not Delegated - Analgesics:  Muscle Relaxants Failed - 11/21/2022 10:33 AM      Failed - This refill cannot be delegated      Failed - Valid encounter within last 6 months    Recent Outpatient Visits           1 year ago Constipation, unspecified constipation type   [facility] Medicine Pickard, Priscille Heidelberg, MD   1 year ago Strain of lumbar region, initial encounter   [facility] Family Medicine Pickard, Priscille Heidelberg, MD   1 year ago Encounter for Medicare annual wellness exam   [facility] Family Medicine Donita Brooks, MD   1 year ago Viral URI   [facility] Medicine Tanya Nones, Priscille Heidelberg, MD   1 year ago Insect bite of left knee, initial encounter   [facility] Family Medicine Valentino Nose, NP       Future Appointments             In 2 months Waymon Budge, MD [facility] [place] Pulmonary Care at [facility] Prescriptions Disp Refills   albuterol (PROAIR HFA) 108 (90 Base) MCG/ACT inhaler 8.5 g 3    Sig: Inhale 2 puffs into the lungs every 4 (four) hours as needed.     Pulmonology:  Beta Agonists 2 Failed - 11/21/2022 10:33 AM      Failed - Valid encounter within last 12 months    Recent Outpatient Visits           1 year ago Constipation, unspecified constipation type   [facility] Medicine Pickard, Priscille Heidelberg, MD   1 year ago Strain of lumbar region, initial encounter   [facility] Medicine Pickard, Priscille Heidelberg, MD   1 year ago Encounter for Medicare annual wellness exam    [facility] Family Medicine Donita Brooks, MD   1 year ago Viral URI   [facility] Family Medicine Tanya Nones, Priscille Heidelberg, MD   1 year ago Insect bite of left knee, initial encounter   [facility] Family Medicine Valentino Nose, NP       Future Appointments             In 2 months Waymon Budge, MD [facility] Pulmonary Care at [facility] - Last BP in normal range    BP Readings from Last 1 Encounters:  11/08/22 124/72         Passed - Last Heart Rate in normal range    Pulse Readings from Last 1 Encounters:  11/08/22 75

## 2022-11-21 NOTE — Telephone Encounter (Signed)
Ok to refill ProAir x 1 year

## 2022-11-21 NOTE — Telephone Encounter (Signed)
Patient is requesting refill on Pro air. Looks like Dr. Tanya Nones has been refilling. Are you okay with me sending in refills?

## 2022-11-22 MED ORDER — CYCLOBENZAPRINE HCL 10 MG PO TABS: 10.0000 mg | ORAL_TABLET | Freq: Three times a day (TID) | ORAL | 0 refills | Status: DC | PRN

## 2022-11-24 ENCOUNTER — Other Ambulatory Visit: Payer: Self-pay | Admitting: Interventional Cardiology

## 2022-11-29 DIAGNOSIS — H353211 Exudative age-related macular degeneration, right eye, with active choroidal neovascularization: Secondary | ICD-10-CM | POA: Diagnosis not present

## 2022-11-30 DIAGNOSIS — R5383 Other fatigue: Secondary | ICD-10-CM | POA: Insufficient documentation

## 2022-11-30 DIAGNOSIS — D649 Anemia, unspecified: Secondary | ICD-10-CM | POA: Insufficient documentation

## 2022-12-02 ENCOUNTER — Other Ambulatory Visit: Payer: Self-pay | Admitting: Family Medicine

## 2022-12-02 DIAGNOSIS — Z08 Encounter for follow-up examination after completed treatment for malignant neoplasm: Secondary | ICD-10-CM | POA: Diagnosis not present

## 2022-12-02 DIAGNOSIS — Z85828 Personal history of other malignant neoplasm of skin: Secondary | ICD-10-CM | POA: Diagnosis not present

## 2022-12-02 DIAGNOSIS — L814 Other melanin hyperpigmentation: Secondary | ICD-10-CM | POA: Diagnosis not present

## 2022-12-02 DIAGNOSIS — L578 Other skin changes due to chronic exposure to nonionizing radiation: Secondary | ICD-10-CM | POA: Diagnosis not present

## 2022-12-02 DIAGNOSIS — L570 Actinic keratosis: Secondary | ICD-10-CM | POA: Diagnosis not present

## 2022-12-02 DIAGNOSIS — D1801 Hemangioma of skin and subcutaneous tissue: Secondary | ICD-10-CM | POA: Diagnosis not present

## 2022-12-02 DIAGNOSIS — M4722 Other spondylosis with radiculopathy, cervical region: Secondary | ICD-10-CM

## 2022-12-02 DIAGNOSIS — L821 Other seborrheic keratosis: Secondary | ICD-10-CM | POA: Diagnosis not present

## 2022-12-02 DIAGNOSIS — G8929 Other chronic pain: Secondary | ICD-10-CM

## 2022-12-05 DIAGNOSIS — H353221 Exudative age-related macular degeneration, left eye, with active choroidal neovascularization: Secondary | ICD-10-CM | POA: Diagnosis not present

## 2022-12-06 DIAGNOSIS — M7070 Other bursitis of hip, unspecified hip: Secondary | ICD-10-CM | POA: Insufficient documentation

## 2022-12-06 DIAGNOSIS — M7072 Other bursitis of hip, left hip: Secondary | ICD-10-CM | POA: Diagnosis not present

## 2022-12-17 DIAGNOSIS — R102 Pelvic and perineal pain: Secondary | ICD-10-CM | POA: Diagnosis not present

## 2022-12-23 DIAGNOSIS — M7072 Other bursitis of hip, left hip: Secondary | ICD-10-CM | POA: Diagnosis not present

## 2022-12-29 ENCOUNTER — Other Ambulatory Visit: Payer: Self-pay | Admitting: Family Medicine

## 2022-12-29 NOTE — Telephone Encounter (Signed)
Prescription Request  12/29/2022  LOV: 11/08/2022  What is the name of the medication or equipment?   apixaban (ELIQUIS) 5 MG TABS tablet   Have you contacted your pharmacy to request a refill? Yes   Which pharmacy would you like this sent to?    [facility] - [place], [place] - [address] [place] [address] Phone: [phone] Fax: [phone]  Patient notified that their request is being sent to the clinical staff for review and that they should receive a response within 2 business days.   Please advise pharmacist.

## 2022-12-30 ENCOUNTER — Other Ambulatory Visit (HOSPITAL_COMMUNITY): Payer: Self-pay

## 2022-12-30 DIAGNOSIS — C61 Malignant neoplasm of prostate: Secondary | ICD-10-CM | POA: Diagnosis not present

## 2022-12-30 MED ORDER — APIXABAN 5 MG PO TABS: 5.0000 mg | ORAL_TABLET | Freq: Two times a day (BID) | ORAL | 3 refills | Status: DC

## 2022-12-30 NOTE — Telephone Encounter (Signed)
Due to a system glitch the last office visit is not detected for this practice.    LOV 11/08/2022.   Labs in date.  Requested Prescriptions  Pending Prescriptions Disp Refills   apixaban (ELIQUIS) 5 MG TABS tablet 180 tablet 3    Sig: Take 1 tablet (5 mg total) by mouth 2 (two) times daily.     Hematology:  Anticoagulants - apixaban Failed - 12/29/2022  3:56 PM      Failed - HGB in normal range and within 360 days    Hemoglobin  Date Value Ref Range Status  10/20/2022 12.5 (L) 13.2 - 17.1 g/dL Final  09/81/1914 78.2 13.0 - 17.7 g/dL Final         Failed - HCT in normal range and within 360 days    HCT  Date Value Ref Range Status  10/20/2022 37.2 (L) 38.5 - 50.0 % Final   Hematocrit  Date Value Ref Range Status  08/28/2018 44.0 37.5 - 51.0 % Final         Failed - Valid encounter within last 12 months    Recent Outpatient Visits           1 year ago Constipation, unspecified constipation type   [facility] Donita Brooks, MD   1 year ago Strain of lumbar region, initial encounter   [facility] Pickard, Priscille Heidelberg, MD   1 year ago Encounter for Medicare annual wellness exam   [facility] Pickard, Priscille Heidelberg, MD   1 year ago Viral URI   [facility] Tanya Nones, Priscille Heidelberg, MD   2 years ago Insect bite of left knee, initial encounter   [facility] Valentino Nose, NP       Future Appointments             In 1 month Young, Rennis Chris, MD [facility] - PLT in normal range and within 360 days    Platelets  Date Value Ref Range Status  10/20/2022 263 140 - 400 Thousand/uL Final  08/28/2018 277 150 - 450 x10E3/uL Final         Passed - Cr in normal range and within 360 days    Creat  Date Value Ref Range Status  10/20/2022 0.82 0.70 - 1.22 mg/dL Final         Passed - AST in normal range and within 360 days    AST  Date Value  Ref Range Status  10/20/2022 25 10 - 35 U/L Final  10/20/2022 25 10 - 35 U/L Final         Passed - ALT in normal range and within 360 days    ALT  Date Value Ref Range Status  10/20/2022 16 9 - 46 U/L Final  10/20/2022 16 9 - 46 U/L Final

## 2023-01-02 ENCOUNTER — Encounter: Payer: Self-pay | Admitting: Family Medicine

## 2023-01-02 ENCOUNTER — Ambulatory Visit (INDEPENDENT_AMBULATORY_CARE_PROVIDER_SITE_OTHER): Payer: PPO | Admitting: Family Medicine

## 2023-01-02 VITALS — BP 120/62 | HR 75 | Temp 97.6°F | Ht 69.0 in | Wt 200.6 lb

## 2023-01-02 DIAGNOSIS — N3001 Acute cystitis with hematuria: Secondary | ICD-10-CM | POA: Diagnosis not present

## 2023-01-02 LAB — URINALYSIS, ROUTINE W REFLEX MICROSCOPIC
Bilirubin Urine: NEGATIVE
Glucose, UA: NEGATIVE
Hyaline Cast: NONE SEEN /LPF
Ketones, ur: NEGATIVE
Nitrite: POSITIVE — AB
Specific Gravity, Urine: 1.015 (ref 1.001–1.035)
Squamous Epithelial / HPF: NONE SEEN /HPF (ref <=5)
pH: 6 (ref 5.0–8.0)

## 2023-01-02 LAB — MICROSCOPIC MESSAGE

## 2023-01-02 MED ORDER — SULFAMETHOXAZOLE-TRIMETHOPRIM 800-160 MG PO TABS
1.0000 | ORAL_TABLET | Freq: Two times a day (BID) | ORAL | 0 refills | Status: DC
Start: 2023-01-02 — End: 2023-01-31

## 2023-01-02 NOTE — Addendum Note (Signed)
Addended by: Venia Carbon K on: 01/02/2023 02:08 PM   Modules accepted: Orders

## 2023-01-02 NOTE — Progress Notes (Signed)
Subjective:    Patient ID: Tyler Allison, male    DOB: [birth year], 82 y.o.   MRN: [id number]   Subjective:   Patient presents today complaining of severe dysuria, urgency and frequency x 5 days.  Urinalysis shows +3 LE, positive nitrates, + blood.   Past Medical History:  Diagnosis Date   Allergy    Anxiety    Asthma    Cancer (HCC)    prostate cancer   Cervical spondylosis with radiculopathy    left c6 radiculopathy   Chronic back pain    GERD (gastroesophageal reflux disease)    Hyperlipidemia    Hypertension    PAF (paroxysmal atrial fibrillation) (HCC)    Prostate cancer (HCC)    Past Surgical History:  Procedure Laterality Date   BACK SURGERY     MICROLARYNGOSCOPY W/VOCAL CORD INJECTION N/A 10/30/2020   Procedure: MICRODIRECT LARYNGOSCOPY WITH VOCAL CORD INJECTION AND CO2 LASER APPLICATION;  Surgeon: Christia Reading, MD;  Location: [facility] OR;  Service: ENT;  Laterality: N/A;   PROSTATE BIOPSY     SPINE SURGERY     back surgery x 3 including lumbar fusion, neck surgery x 2 including C4-6 ACDF   tympanoplasty with mastoidectomy Left 01/13/2004   Current Outpatient Medications on File Prior to Visit  Medication Sig Dispense Refill   acetaminophen (TYLENOL) 500 MG tablet Take 500 mg by mouth every 6 (six) hours as needed for moderate pain.     albuterol (PROAIR HFA) 108 (90 Base) MCG/ACT inhaler Inhale 2 puffs into the lungs every 4 (four) hours as needed. 8.5 g 11   apixaban (ELIQUIS) 5 MG TABS tablet Take 1 tablet (5 mg total) by mouth 2 (two) times daily. 180 tablet 3   atorvastatin (LIPITOR) 40 MG tablet TAKE 1 TABLET(40 MG) BY MOUTH DAILY 90 tablet 3   clonazePAM (KLONOPIN) 0.5 MG tablet TAKE 1 TABLET(0.5 MG) BY MOUTH TWICE DAILY AS NEEDED FOR ANXIETY 60 tablet 1   cyclobenzaprine (FLEXERIL) 10 MG tablet Take 1 tablet (10 mg total) by mouth 3 (three) times daily as needed for muscle spasms. 30 tablet 0   fluticasone (FLONASE) 50 MCG/ACT nasal spray instill two SPRAYS in  each nostril daily AT night 16 g 6   fluticasone furoate-vilanterol (BREO ELLIPTA) 200-25 MCG/ACT AEPB Inhale 1 puff into the lungs daily. 1 each 5   loratadine (CLARITIN) 10 MG tablet Take 1 tablet (10 mg total) by mouth daily. 30 tablet 5   Melatonin 10 MG TABS Take 10 mg by mouth at bedtime as needed (sleep).     metoprolol succinate (TOPROL-XL) 25 MG 24 hr tablet TAKE ONE TABLET BY MOUTH EVERY EVENING 90 tablet 3   pantoprazole (PROTONIX) 40 MG tablet TAKE 1 TABLET(40 MG) BY MOUTH TWICE DAILY 180 tablet 3   predniSONE (STERAPRED UNI-PAK 21 TAB) 10 MG (21) TBPK tablet Take 10 mg by mouth daily.     traMADol (ULTRAM) 50 MG tablet Take 1 tablet (50 mg total) by mouth at bedtime as needed for moderate pain. 30 tablet 0   venlafaxine (EFFEXOR) 25 MG tablet Take 25 mg by mouth 2 (two) times daily.     No current facility-administered medications on file prior to visit.   Allergies  Allergen Reactions   Codeine Nausea And Vomiting   Social History   Socioeconomic History   Marital status: Married    Spouse name: Not on file   Number of children: 2   Years of education: Not on file  Highest education level: Not on file  Occupational History    Comment: retired  Tobacco Use   Smoking status: Former    Current packs/day: 0.00    Average packs/day: 0.5 packs/day for 13.5 years (6.7 ttl pk-yrs)    Types: Cigarettes    Start date: 69    Quit date: 10/23/1968    Years since quitting: 54.2   Smokeless tobacco: Never  Vaping Use   Vaping status: Never Used  Substance and Sexual Activity   Alcohol use: No   Drug use: No   Sexual activity: Not Currently    Comment: married, retired.  Other Topics Concern   Not on file  Social History Narrative   2 children but 1 deceased   Social Determinants of Health   Financial Resource Strain: Low Risk  (02/18/2022)   Overall Financial Resource Strain (CARDIA)    Difficulty of Paying Living Expenses: Not very hard  Food Insecurity: Low  Risk  (10/06/2022)   [facility]   Food vital sign    Within the past 12 months, you worried that your food would run out before you got money to buy more: Never true    Within the past 12 months, the food you bought just didn't last and you didn't have money to get more. : Never true  Transportation Needs: No Transportation Needs (10/06/2022)   [facility]   Transportation    In the past 12 months, has lack of reliable transportation kept you from medical appointments, meetings, work or from getting things needed for daily living? : No  Physical Activity: Insufficiently Active (02/18/2022)   Exercise Vital Sign    Days of Exercise per Week: 1 day    Minutes of Exercise per Session: 20 min  Stress: No Stress Concern Present (02/18/2022)   [employer] of Occupational Health - Occupational Stress Questionnaire    Feeling of Stress : Not at all  Social Connections: Socially Integrated (02/18/2022)   Social Connection and Isolation Panel [NHANES]    Frequency of Communication with Friends and Family: Three times a week    Frequency of Social Gatherings with Friends and Family: Three times a week    Attends Religious Services: 1 to 4 times per year    Active Member of Clubs or Organizations: No    Attends Banker Meetings: 1 to 4 times per year    Marital Status: Married  Catering manager Violence: Not At Risk (02/18/2022)   Humiliation, Afraid, Rape, and Kick questionnaire    Fear of Current or Ex-Partner: No    Emotionally Abused: No    Physically Abused: No    Sexually Abused: No   Family History  Problem Relation Age of Onset   Heart failure Mother    Esophageal cancer Father    Heart attack Maternal Uncle    Breast cancer Neg Hx    Prostate cancer Neg Hx    Pancreatic cancer Neg Hx    Colon cancer Neg Hx     Review of Systems  All other systems reviewed and are negative.      Objective:    Physical Exam Vitals reviewed.  Constitutional:      General: He is not in acute distress.    Appearance: He is well-developed. He is not diaphoretic.  HENT:     Head: Normocephalic and atraumatic.     Right Ear: Tympanic membrane and ear canal normal.  Left Ear: Tympanic membrane and ear canal normal.     Nose: No congestion or rhinorrhea.     Mouth/Throat:     Mouth: Mucous membranes are moist.     Pharynx: Oropharynx is clear. No oropharyngeal exudate or posterior oropharyngeal erythema.  Eyes:     Conjunctiva/sclera: Conjunctivae normal.  Neck:     Thyroid: No thyromegaly.     Vascular: No JVD.     Trachea: No tracheal deviation.  Cardiovascular:     Rate and Rhythm: Normal rate and regular rhythm.     Heart sounds: Normal heart sounds. No murmur heard.    No friction rub. No gallop.  Pulmonary:     Effort: Pulmonary effort is normal. No respiratory distress.     Breath sounds: Normal breath sounds. No stridor. No wheezing or rales.  Chest:     Chest wall: No tenderness.  Neurological:     Mental Status: He is alert.     Motor: No abnormal muscle tone.         Assessment & Plan:  Acute cystitis with hematuria Bactrim ds bid for 10 days

## 2023-01-04 LAB — URINE CULTURE
MICRO NUMBER:: 15380903
SPECIMEN QUALITY:: ADEQUATE

## 2023-01-06 DIAGNOSIS — R351 Nocturia: Secondary | ICD-10-CM | POA: Diagnosis not present

## 2023-01-06 DIAGNOSIS — C61 Malignant neoplasm of prostate: Secondary | ICD-10-CM | POA: Diagnosis not present

## 2023-01-06 DIAGNOSIS — N401 Enlarged prostate with lower urinary tract symptoms: Secondary | ICD-10-CM | POA: Diagnosis not present

## 2023-01-16 DIAGNOSIS — Z961 Presence of intraocular lens: Secondary | ICD-10-CM | POA: Diagnosis not present

## 2023-01-16 DIAGNOSIS — H35453 Secondary pigmentary degeneration, bilateral: Secondary | ICD-10-CM | POA: Diagnosis not present

## 2023-01-16 DIAGNOSIS — H353231 Exudative age-related macular degeneration, bilateral, with active choroidal neovascularization: Secondary | ICD-10-CM | POA: Diagnosis not present

## 2023-01-16 DIAGNOSIS — H35723 Serous detachment of retinal pigment epithelium, bilateral: Secondary | ICD-10-CM | POA: Diagnosis not present

## 2023-01-16 DIAGNOSIS — H35363 Drusen (degenerative) of macula, bilateral: Secondary | ICD-10-CM | POA: Diagnosis not present

## 2023-01-17 ENCOUNTER — Other Ambulatory Visit: Payer: Self-pay | Admitting: Family Medicine

## 2023-01-17 DIAGNOSIS — K219 Gastro-esophageal reflux disease without esophagitis: Secondary | ICD-10-CM

## 2023-01-17 NOTE — Telephone Encounter (Signed)
Prescription Request  01/17/2023  LOV: 01/02/2023  What is the name of the medication or equipment?   pantoprazole (PROTONIX) 40 MG tablet  **Only has a few pills left. Requesting 30 day script**  Have you contacted your pharmacy to request a refill? Yes   Which pharmacy would you like this sent to?    [id number] [place], [place] - [address] [place] [address] Phone: [phone] Fax: [phone]   Patient notified that their request is being sent to the clinical staff for review and that they should receive a response within 2 business days.   Please advise patient at [phone].

## 2023-01-18 MED ORDER — PANTOPRAZOLE SODIUM 40 MG PO TBEC
DELAYED_RELEASE_TABLET | ORAL | 0 refills | Status: DC
Start: 2023-01-18 — End: 2023-06-27

## 2023-01-18 NOTE — Telephone Encounter (Signed)
Patient requesting 30 days.  Requested Prescriptions  Pending Prescriptions Disp Refills   pantoprazole (PROTONIX) 40 MG tablet 60 tablet 0    Sig: TAKE 1 TABLET(40 MG) BY MOUTH TWICE DAILY     Gastroenterology: Proton Pump Inhibitors Failed - 01/18/2023 10:58 AM      Failed - Valid encounter within last 12 months    Recent Outpatient Visits           1 year ago Constipation, unspecified constipation type   [facility] Donita Brooks, MD   1 year ago Strain of lumbar region, initial encounter   [facility] Pickard, Priscille Heidelberg, MD   1 year ago Encounter for Medicare annual wellness exam   [facility] Donita Brooks, MD   1 year ago Viral URI   [facility] Tanya Nones Priscille Heidelberg, MD   2 years ago Insect bite of left knee, initial encounter   [facility] Valentino Nose, NP       Future Appointments             In 1 week Maple Hudson, Rennis Chris, MD [facility] at [facility]

## 2023-01-20 ENCOUNTER — Telehealth: Payer: Self-pay

## 2023-01-20 NOTE — Telephone Encounter (Signed)
Give  the Rx script to nanette, will call pt to pick up Monday.

## 2023-01-20 NOTE — Telephone Encounter (Signed)
Pt came into office to ask if pcp would give him a written prescription for this med apixaban (ELIQUIS) 5 MG TABS tablet [[id number]]. Pt needs to written prescription for this med to help receive a free 30 day supply. Pt was also given 2 sample boxes of this med until he can get this written prescription filled. Please call pt when this prescription is available to pick up.  Cb#: [phone]

## 2023-01-23 NOTE — Telephone Encounter (Signed)
Patient called to advise his new pharmacy is [facility]; stated they are requesting a full list of his medications.  Please advise pharmacist at [phone].

## 2023-01-24 DIAGNOSIS — H353211 Exudative age-related macular degeneration, right eye, with active choroidal neovascularization: Secondary | ICD-10-CM | POA: Diagnosis not present

## 2023-01-28 NOTE — Progress Notes (Deleted)
HPI M former smoker followed for dyspnea, cough  Asthma, complicated by HBP, Hypercholesterolemia, PAFib, Lung nodule(2012), Cervical spondylosis, Dysphagia Labs- nl CMET, BNP and Hgb 08/28/2018 Echo 08/28/2018- EF 55-60%, no wall motion abnl or PH PFT 01/15/2019- Minimal obstruction, no resp to BD, Nl lung volumes, Nl Diffusion CTa chest 06/21/19- Aortic atherosclerosis, lungs clear 10/30/19- 425 meters, lowest O2 sat on room air 95%, max HR 80 HST 12/04/19- AHI 11.8/ hr MOSTLY CENTRAL APNEAS, desaturation to 85%/ average 93%, body weight 203 lbs ONOX 08/17/20- 12-13 minutes </= 88%. Likely REM associated. ------------------------------------------------------------------------  02/10/21- 80 yoM former smoker( 6.5 pk yrs) followed for Chronic Bronchitis, dyspnea, cough, hx Nocturnal Hypoxemia, Sleep Apnea/ Central, Asthma, complicated by HBP, Hyperlipidemia, PAFib/ Xarelto, Lung nodule(2012), Cervical spondylosis, Dysphagia, Prostate Cancer, -Proair, Flonase, Symbicort 160/ Anoro sample, Zyrtec Covid vax-3 Phizer Flu vax-had Body weight today-203 lbs Dr Jenne Pane ENT treated for hoarsenesss and hemoptysis with resection of varix and injection of cords for bowing. Voice is better. Relieved to finish XRT for prostate cancer.  Morning cough productive clear mucus, then mostly good through day.  DOE has improved- somewhat random- his PCP questioned "anxiety". Rescue inhaler used only occasionally- does help. Other inhalers including Symbicort don't seem to make a difference.  CXR 08/11/20-  IMPRESSION: Mild bilateral peribronchial cuffing. Bronchitis could present this fashion. Low lung volumes with mild bibasilar atelectasis.  01/31/23- 82 yoM former smoker( 6.5 pk yrs) followed for Chronic Asthmatic Bronchitis, dyspnea, cough, hx Nocturnal Hypoxemia, Sleep Apnea/ Central, complicated by HBP, Hyperlipidemia, PAFib/ Xarelto, Lung nodule(2012), Cervical spondylosis, Dysphagia, Prostate Cancer,GERD,   -Proair, Flonase, Symbicort 160/ Anoro sample, Zyrtec Body weight today- LOV Dr Celine Mans 10/28/22-exacerbation of SOB/ Asthma> Dulera 100>Breo 200  CXR 09/03/21- IMPRESSION: No active cardiopulmonary disease.  ROS-see HPI  + = positive Constitutional:    weight loss, night sweats, fevers, chills, fatigue, lassitude. HEENT:    headaches, difficulty swallowing, tooth/dental problems, sore throat,       sneezing, itching, ear ache, nasal congestion, post nasal drip, snoring CV:    chest pain, orthopnea, PND, swelling in lower extremities, anasarca,                                   dizziness, palpitations Resp:   +shortness of breath with exertion or at rest.                +productive cough,   +non-productive cough, coughing up of blood.              change in color of mucus.  wheezing.   Skin:    rash or lesions. GI:  No-   heartburn, indigestion, abdominal pain, nausea, vomiting, diarrhea,                 change in bowel habits, loss of appetite GU: dysuria, change in color of urine, no urgency or frequency.   flank pain. MS:   joint pain, stiffness, decreased range of motion, back pain. Neuro-     nothing unusual Psych:  change in mood or affect.  depression or anxiety.   memory loss.  OBJ- Physical Exam   Arrival room air sat 95% General- Alert, Oriented, Affect-appropriate, Distress- none acute,  Skin- rash-none, lesions- none, excoriation- none Lymphadenopathy- none Head- atraumatic            Eyes- Gross vision intact, PERRLA, conjunctivae and secretions clear  Ears- Hearing, canals-normal            Nose- Clear, no-Septal dev, mucus, polyps, erosion, perforation             Throat- Mallampati II , mucosa clear , drainage- none, tonsils- atrophic,  Neck- flexible , trachea midline, no stridor , thyroid nl, carotid no bruit Chest - symmetrical excursion , unlabored           Heart/CV- RRR faint , no murmur , no gallop  , no rub, nl s1 s2                           -  JVD+1 cm , edema- none, stasis changes- none, varices- none           Lung- +clear, wheeze- none, cough- none , dullness-none, rub- none           Chest wall-  Abd-  Br/ Gen/ Rectal- Not done, not indicated Extrem- cyanosis- none, clubbing, none, atrophy- none, strength- nl Neuro- grossly intact to observation

## 2023-01-30 DIAGNOSIS — H353221 Exudative age-related macular degeneration, left eye, with active choroidal neovascularization: Secondary | ICD-10-CM | POA: Diagnosis not present

## 2023-01-31 ENCOUNTER — Ambulatory Visit: Payer: PPO | Admitting: Internal Medicine

## 2023-01-31 ENCOUNTER — Encounter: Payer: Self-pay | Admitting: Family Medicine

## 2023-01-31 ENCOUNTER — Ambulatory Visit: Payer: PPO | Admitting: Family Medicine

## 2023-01-31 VITALS — BP 122/62 | HR 74 | Temp 97.4°F | Ht 69.0 in | Wt 204.0 lb

## 2023-01-31 DIAGNOSIS — L299 Pruritus, unspecified: Secondary | ICD-10-CM | POA: Diagnosis not present

## 2023-01-31 DIAGNOSIS — R002 Palpitations: Secondary | ICD-10-CM | POA: Diagnosis not present

## 2023-01-31 DIAGNOSIS — Z23 Encounter for immunization: Secondary | ICD-10-CM | POA: Diagnosis not present

## 2023-01-31 MED ORDER — TRIAMCINOLONE ACETONIDE 0.1 % EX CREA: 1.0000 | TOPICAL_CREAM | Freq: Two times a day (BID) | CUTANEOUS | 0 refills | Status: DC

## 2023-01-31 NOTE — Addendum Note (Signed)
Addended by: Venia Carbon K on: 01/31/2023 05:18 PM   Modules accepted: Orders

## 2023-01-31 NOTE — Progress Notes (Signed)
Subjective:    Patient ID: Tyler Allison, male    DOB: 19-Jun-1940, 82 y.o.   MRN: [id number]   Subjective:    Patient states that recently when he goes outside and gets hot, he develops itching on the skin over his forearms bilaterally and over his lower legs below his knees.  Only the exposed areas of his skin mentioned above start to itch.  The itching becomes severe.  It only occurs when he goes outside.  There is no visible rash.  However heat seems to trigger it.  There is no urticaria or hives.  He denies any fevers or chills.  He denies any changes in his medication.  Recently he has noticed palpitations.  On examination today he is in normal sinus rhythm but I did auscultate an occasional skipped beat that sounded like a PVC.  I performed an EKG today which shows normal sinus rhythm with right bundle branch block but no evidence of a second-degree block. Past Medical History:  Diagnosis Date   Allergy    Anxiety    Asthma    Cancer (HCC)    prostate cancer   Cervical spondylosis with radiculopathy    left c6 radiculopathy   Chronic back pain    GERD (gastroesophageal reflux disease)    Hyperlipidemia    Hypertension    PAF (paroxysmal atrial fibrillation) (HCC)    Prostate cancer (HCC)    Past Surgical History:  Procedure Laterality Date   BACK SURGERY     MICROLARYNGOSCOPY W/VOCAL CORD INJECTION N/A 10/30/2020   Procedure: MICRODIRECT LARYNGOSCOPY WITH VOCAL CORD INJECTION AND CO2 LASER APPLICATION;  Surgeon: Christia Reading, MD;  Location: [facility] OR;  Service: ENT;  Laterality: N/A;   PROSTATE BIOPSY     SPINE SURGERY     back surgery x 3 including lumbar fusion, neck surgery x 2 including C4-6 ACDF   tympanoplasty with mastoidectomy Left 01/13/2004   Current Outpatient Medications on File Prior to Visit  Medication Sig Dispense Refill   acetaminophen (TYLENOL) 500 MG tablet Take 500 mg by mouth every 6 (six) hours as needed for moderate pain.     albuterol (PROAIR HFA)  108 (90 Base) MCG/ACT inhaler Inhale 2 puffs into the lungs every 4 (four) hours as needed. 8.5 g 11   apixaban (ELIQUIS) 5 MG TABS tablet Take 1 tablet (5 mg total) by mouth 2 (two) times daily. 180 tablet 3   atorvastatin (LIPITOR) 40 MG tablet TAKE 1 TABLET(40 MG) BY MOUTH DAILY 90 tablet 3   clonazePAM (KLONOPIN) 0.5 MG tablet TAKE 1 TABLET(0.5 MG) BY MOUTH TWICE DAILY AS NEEDED FOR ANXIETY 60 tablet 1   cyclobenzaprine (FLEXERIL) 10 MG tablet Take 1 tablet (10 mg total) by mouth 3 (three) times daily as needed for muscle spasms. 30 tablet 0   fluticasone (FLONASE) 50 MCG/ACT nasal spray instill two SPRAYS in each nostril daily AT night 16 g 6   loratadine (CLARITIN) 10 MG tablet Take 1 tablet (10 mg total) by mouth daily. 30 tablet 5   Melatonin 10 MG TABS Take 10 mg by mouth at bedtime as needed (sleep).     metoprolol succinate (TOPROL-XL) 25 MG 24 hr tablet TAKE ONE TABLET BY MOUTH EVERY EVENING 90 tablet 3   pantoprazole (PROTONIX) 40 MG tablet TAKE 1 TABLET(40 MG) BY MOUTH TWICE DAILY 60 tablet 0   traMADol (ULTRAM) 50 MG tablet Take 1 tablet (50 mg total) by mouth at bedtime as needed for moderate  pain. 30 tablet 0   fluticasone furoate-vilanterol (BREO ELLIPTA) 200-25 MCG/ACT AEPB Inhale 1 puff into the lungs daily. (Patient not taking: Reported on 01/31/2023) 1 each 5   No current facility-administered medications on file prior to visit.   Allergies  Allergen Reactions   Codeine Nausea And Vomiting   Social History   Socioeconomic History   Marital status: Married    Spouse name: Not on file   Number of children: 2   Years of education: Not on file   Highest education level: Not on file  Occupational History    Comment: retired  Tobacco Use   Smoking status: Former    Current packs/day: 0.00    Average packs/day: 0.5 packs/day for 13.5 years (6.7 ttl pk-yrs)    Types: Cigarettes    Start date: 6    Quit date: 10/23/1968    Years since quitting: 54.3   Smokeless  tobacco: Never  Vaping Use   Vaping status: Never Used  Substance and Sexual Activity   Alcohol use: No   Drug use: No   Sexual activity: Not Currently    Comment: married, retired.  Other Topics Concern   Not on file  Social History Narrative   2 children but 1 deceased   Social Determinants of Health   Financial Resource Strain: Low Risk  (02/18/2022)   Overall Financial Resource Strain (CARDIA)    Difficulty of Paying Living Expenses: Not very hard  Food Insecurity: Low Risk  (10/06/2022)   [facility]   Hunger Vital Sign    Worried About Running Out of Food in the Last Year: Never true    Ran Out of Food in the Last Year: Never true  Transportation Needs: No Transportation Needs (10/06/2022)   [facility]   Transportation    In the past 12 months, has lack of reliable transportation kept you from medical appointments, meetings, work or from getting things needed for daily living? : No  Physical Activity: Insufficiently Active (02/18/2022)   Exercise Vital Sign    Days of Exercise per Week: 1 day    Minutes of Exercise per Session: 20 min  Stress: No Stress Concern Present (02/18/2022)   [employer] of Occupational Health - Occupational Stress Questionnaire    Feeling of Stress : Not at all  Social Connections: Socially Integrated (02/18/2022)   Social Connection and Isolation Panel [NHANES]    Frequency of Communication with Friends and Family: Three times a week    Frequency of Social Gatherings with Friends and Family: Three times a week    Attends Religious Services: 1 to 4 times per year    Active Member of Clubs or Organizations: No    Attends Banker Meetings: 1 to 4 times per year    Marital Status: Married  Catering manager Violence: Not At Risk (02/18/2022)   Humiliation, Afraid, Rape, and Kick questionnaire    Fear of Current or Ex-Partner: No    Emotionally Abused: No     Physically Abused: No    Sexually Abused: No   Family History  Problem Relation Age of Onset   Heart failure Mother    Esophageal cancer Father    Heart attack Maternal Uncle    Breast cancer Neg Hx    Prostate cancer Neg Hx    Pancreatic cancer Neg Hx    Colon cancer Neg Hx     Review of Systems  All other systems  reviewed and are negative.      Objective:   Physical Exam Vitals reviewed.  Constitutional:      General: He is not in acute distress.    Appearance: He is well-developed. He is not diaphoretic.  HENT:     Head: Normocephalic and atraumatic.     Right Ear: Tympanic membrane and ear canal normal.     Left Ear: Tympanic membrane and ear canal normal.     Nose: No congestion or rhinorrhea.     Mouth/Throat:     Mouth: Mucous membranes are moist.     Pharynx: Oropharynx is clear. No oropharyngeal exudate or posterior oropharyngeal erythema.  Eyes:     Conjunctiva/sclera: Conjunctivae normal.  Neck:     Thyroid: No thyromegaly.     Vascular: No JVD.     Trachea: No tracheal deviation.  Cardiovascular:     Rate and Rhythm: Normal rate and regular rhythm.     Heart sounds: Normal heart sounds. No murmur heard.    No friction rub. No gallop.  Pulmonary:     Effort: Pulmonary effort is normal. No respiratory distress.     Breath sounds: Normal breath sounds. No stridor. No wheezing or rales.  Chest:     Chest wall: No tenderness.  Neurological:     Mental Status: He is alert.     Motor: No abnormal muscle tone.    There is no visible rash     Assessment & Plan:  Palpitations - Plan: EKG 12-Lead  Itching There is no visible rash to explain the itching.  I believe this may be neuropathic possibly triggered by heat.  I will try the patient on triamcinolone cream applied to the affected areas prior to going outside to see if this helps prevent itching.  Once the weather cools he may be able to discontinue the cream.  If not improving I would try gabapentin  for neuropathic.  EKG today is unremarkable.  I believe that the palpitations the patient is feeling is likely PVCs.  He has a known history of paroxysmal atrial fibrillation but today he is in normal sinus rhythm

## 2023-02-14 ENCOUNTER — Other Ambulatory Visit: Payer: Self-pay

## 2023-02-14 ENCOUNTER — Emergency Department (HOSPITAL_BASED_OUTPATIENT_CLINIC_OR_DEPARTMENT_OTHER)
Admission: EM | Admit: 2023-02-14 | Discharge: 2023-02-14 | Disposition: A | Discharge status: Home / Self Care | Payer: PPO | Attending: Emergency Medicine | Admitting: Emergency Medicine

## 2023-02-14 ENCOUNTER — Emergency Department (HOSPITAL_BASED_OUTPATIENT_CLINIC_OR_DEPARTMENT_OTHER): Payer: PPO | Admitting: Radiology

## 2023-02-14 ENCOUNTER — Encounter (HOSPITAL_BASED_OUTPATIENT_CLINIC_OR_DEPARTMENT_OTHER): Payer: Self-pay | Admitting: Emergency Medicine

## 2023-02-14 DIAGNOSIS — R9389 Abnormal findings on diagnostic imaging of other specified body structures: Secondary | ICD-10-CM | POA: Diagnosis not present

## 2023-02-14 DIAGNOSIS — J449 Chronic obstructive pulmonary disease, unspecified: Secondary | ICD-10-CM | POA: Diagnosis not present

## 2023-02-14 DIAGNOSIS — R0602 Shortness of breath: Secondary | ICD-10-CM | POA: Diagnosis not present

## 2023-02-14 DIAGNOSIS — Z20822 Contact with and (suspected) exposure to covid-19: Secondary | ICD-10-CM | POA: Insufficient documentation

## 2023-02-14 DIAGNOSIS — R062 Wheezing: Secondary | ICD-10-CM | POA: Insufficient documentation

## 2023-02-14 LAB — RESP PANEL BY RT-PCR (RSV, FLU A&B, COVID)  RVPGX2
Influenza A by PCR: NEGATIVE
Influenza B by PCR: NEGATIVE
Resp Syncytial Virus by PCR: NEGATIVE
SARS Coronavirus 2 by RT PCR: NEGATIVE

## 2023-02-14 LAB — CBC WITH DIFFERENTIAL/PLATELET
Abs Immature Granulocytes: 0.01 10*3/uL (ref 0.00–0.07)
Basophils Absolute: 0 10*3/uL (ref 0.0–0.1)
Basophils Relative: 1 %
Eosinophils Absolute: 0.2 10*3/uL (ref 0.0–0.5)
Eosinophils Relative: 3 %
HCT: 38.3 % — ABNORMAL LOW (ref 39.0–52.0)
Hemoglobin: 12.9 g/dL — ABNORMAL LOW (ref 13.0–17.0)
Immature Granulocytes: 0 %
Lymphocytes Relative: 19 %
Lymphs Abs: 1.1 10*3/uL (ref 0.7–4.0)
MCH: 30.9 pg (ref 26.0–34.0)
MCHC: 33.7 g/dL (ref 30.0–36.0)
MCV: 91.8 fL (ref 80.0–100.0)
Monocytes Absolute: 1 10*3/uL (ref 0.1–1.0)
Monocytes Relative: 18 %
Neutro Abs: 3.4 10*3/uL (ref 1.7–7.7)
Neutrophils Relative %: 59 %
Platelets: 199 10*3/uL (ref 150–400)
RBC: 4.17 MIL/uL — ABNORMAL LOW (ref 4.22–5.81)
RDW: 12.8 % (ref 11.5–15.5)
WBC: 5.7 10*3/uL (ref 4.0–10.5)
nRBC: 0 % (ref 0.0–0.2)

## 2023-02-14 LAB — BASIC METABOLIC PANEL
Anion gap: 7 (ref 5–15)
BUN: 15 mg/dL (ref 8–23)
CO2: 27 mmol/L (ref 22–32)
Calcium: 9.1 mg/dL (ref 8.9–10.3)
Chloride: 104 mmol/L (ref 98–111)
Creatinine, Ser: 0.99 mg/dL (ref 0.61–1.24)
GFR, Estimated: >60 mL/min (ref >60)
Glucose, Bld: 87 mg/dL (ref 70–99)
Potassium: 4 mmol/L (ref 3.5–5.1)
Sodium: 138 mmol/L (ref 135–145)

## 2023-02-14 LAB — BRAIN NATRIURETIC PEPTIDE: B Natriuretic Peptide: 89.4 pg/mL (ref 0.0–100.0)

## 2023-02-14 LAB — TROPONIN I (HIGH SENSITIVITY): Troponin I (High Sensitivity): 9 ng/L (ref <18)

## 2023-02-14 MED ORDER — IPRATROPIUM-ALBUTEROL 0.5-2.5 (3) MG/3ML IN SOLN: 3.0000 mL | Freq: Four times a day (QID) | RESPIRATORY_TRACT | 0 refills | Status: AC | PRN

## 2023-02-14 MED ORDER — IPRATROPIUM-ALBUTEROL 0.5-2.5 (3) MG/3ML IN SOLN
6.0000 mL | Freq: Once | RESPIRATORY_TRACT | Status: AC
  Administered 2023-02-14: 6 mL via RESPIRATORY_TRACT
  Filled 2023-02-14: qty 3

## 2023-02-14 MED ORDER — AZITHROMYCIN 250 MG PO TABS: 250.0000 mg | ORAL_TABLET | Freq: Every day | ORAL | 0 refills | Status: DC

## 2023-02-14 MED ORDER — PREDNISONE 20 MG PO TABS: 40.0000 mg | ORAL_TABLET | Freq: Every day | ORAL | 0 refills | Status: AC

## 2023-02-14 NOTE — ED Triage Notes (Signed)
Pt reports worsening shob over last week. Denies fever

## 2023-02-14 NOTE — ED Provider Notes (Signed)
[place] EMERGENCY DEPARTMENT AT [facility] Provider Note   CSN: [id number] Arrival date & time: 02/14/23  1246     History Chief Complaint  Patient presents with   Shortness of Breath    HPI Tyler Allison is a 82 y.o. male presenting for chief complaint shortness of breath.  82 year old male with a history of asthma, COPD follows with pulmonology recently changed inhalers due to dyspnea on exertion for the last 2 months.  However over the last 10 days he has had severe dyspnea with positional changes.  He endorses worsening morning sputum production where he spends 5 to 10 minutes every morning just coughing as soon as he wakes up. Denies fever or severe discomfort.  He states that he is scheduled for vacation in 4 days and is worried about being unable to even ambulate out of the house.  States has had benefit from prednisone burst in the past but was unable to get in with his pulmonologist until next month.  Patient's recorded medical, surgical, social, medication list and allergies were reviewed in the Snapshot window as part of the initial history.   Review of Systems   Review of Systems  Constitutional:  Negative for chills and fever.  HENT:  Negative for ear pain and sore throat.   Eyes:  Negative for pain and visual disturbance.  Respiratory:  Positive for cough, chest tightness, shortness of breath and wheezing.   Cardiovascular:  Negative for chest pain and palpitations.  Gastrointestinal:  Negative for abdominal pain and vomiting.  Genitourinary:  Negative for dysuria and hematuria.  Musculoskeletal:  Negative for arthralgias and back pain.  Skin:  Negative for color change and rash.  Neurological:  Negative for seizures and syncope.  All other systems reviewed and are negative.   Physical Exam Updated Vital Signs BP 127/70   Pulse (!) 56   Temp 98.2 F (36.8 C) (Oral)   Resp (!) 9   Wt 88.9 kg   SpO2 94%   BMI 28.94 kg/m  Physical Exam Vitals  and nursing note reviewed.  Constitutional:      General: He is not in acute distress.    Appearance: He is well-developed.  HENT:     Head: Normocephalic and atraumatic.  Eyes:     Conjunctiva/sclera: Conjunctivae normal.  Cardiovascular:     Rate and Rhythm: Normal rate and regular rhythm.     Heart sounds: No murmur heard. Pulmonary:     Effort: Pulmonary effort is normal. No respiratory distress.     Breath sounds: Decreased breath sounds and rhonchi present.  Abdominal:     Palpations: Abdomen is soft.     Tenderness: There is no abdominal tenderness.  Musculoskeletal:        General: No swelling.     Cervical back: Neck supple.  Skin:    General: Skin is warm and dry.     Capillary Refill: Capillary refill takes less than 2 seconds.  Neurological:     Mental Status: He is alert.  Psychiatric:        Mood and Affect: Mood normal.      ED Course/ Medical Decision Making/ A&P    Procedures Procedures   Medications Ordered in ED Medications  ipratropium-albuterol (DUONEB) 0.5-2.5 (3) MG/3ML nebulizer solution 6 mL (6 mLs Nebulization Given 02/14/23 1450)   Medical Decision Making:   [name] is a 82 y.o. male with a history of COPD and afib, who presented to the ED today  with acute on chronic SOB. They are endorsing worsening of their baseline dyspnea over the past 72 hours. Their baseline is a 0L O2 requirement. At their baseline they are able to get around the neighborhood and they are not able to at this time.   On my initial exam, the pt was SOB and tachypneic. Audible wheezing and grossly decreased breath sounds appreciated.  They are endorsing increased sputum production.    Reviewed and confirmed nursing documentation for past medical history, family history, social history.    Initial Assessment:   With the patient's presentation of SOB in the above setting, most likely diagnosis is COPD Exacerbation. Other diagnoses were considered including (but not  limited to) CAP, PE, ACS, viral infection, PTX. These are considered less likely due to history of present illness and physical exam findings.   This is most consistent with an acute life/limb threatening illness complicated by underlying chronic conditions.  Initial Plan:  Empiric treatment of patient's symptoms with immediate initiation of inhaled bronchodilators and IV steroids.   Evaluation for ACS with EKG and delta troponin  Evaluation for infectious versus intrathoracic abnormality with chest x-ray  Evaluation for volume overload with BNP  Screening labs including CBC and Metabolic panel to evaluate for infectious or metabolic etiology of disease.  Patient's Wells score is low and patient does not warrant further objective evaluation for PE based on consistency of presentation of alternative diagnosis.  Objective evaluation as below reviewed   Initial Study Results:   Laboratory  All laboratory results reviewed without evidence of clinically relevant pathology.    EKG EKG was reviewed independently. Rate, rhythm, axis, intervals all examined and without medically relevant abnormality. ST segments without concerns for elevations.    Radiology:  All images reviewed independently. Agree with radiology report at this time.   DG Chest 2 View  Result Date: 02/14/2023 CLINICAL DATA:  Shortness of breath. EXAM: CHEST - 2 VIEW COMPARISON:  09/03/2021. FINDINGS: Bilateral lung fields are clear. Redemonstration of elevated right hemidiaphragm. Bilateral costophrenic angles are clear. Normal cardio-mediastinal silhouette. No acute osseous abnormalities. Partially seen lower cervical spinal fixation hardware. The soft tissues are within normal limits. IMPRESSION: No active cardiopulmonary disease. Electronically Signed   By: Jules Schick M.D.   On: 02/14/2023 15:08      Final Assessment and Plan:   After initiation of medical therapies, patient is grossly improved and no longer in acute  distress.   Upon conversation with patient, given 2 weeks of worsening symptoms and worsening sputum production, will treat with azithromycin, prednisone burst and plan for follow-up with PCP before he goes on his trip for repeat evaluation of symptoms.  Disposition:  I have considered need for hospitalization, however, considering all of the above, I believe this patient is stable for discharge at this time.  Patient/family educated about specific return precautions for given chief complaint and symptoms.  Patient/family educated about follow-up with PCP.     Patient/family expressed understanding of return precautions and need for follow-up. Patient spoken to regarding all imaging and laboratory results and appropriate follow up for these results. All education provided in verbal form with additional information in written form. Time was allowed for answering of patient questions. Patient discharged.    Emergency Department Medication Summary:   Medications  ipratropium-albuterol (DUONEB) 0.5-2.5 (3) MG/3ML nebulizer solution 6 mL (6 mLs Nebulization Given 02/14/23 1450)    Clinical Impression:  1. Wheezing      Discharge   Final Clinical Impression(s) /  ED Diagnoses Final diagnoses:  Wheezing    Rx / DC Orders ED Discharge Orders          Ordered    ipratropium-albuterol (DUONEB) 0.5-2.5 (3) MG/3ML SOLN  Every 6 hours PRN        02/14/23 1536    predniSONE (DELTASONE) 20 MG tablet  Daily        02/14/23 1536    azithromycin (ZITHROMAX) 250 MG tablet  Daily        02/14/23 1536              Glyn Ade, MD 02/14/23 1536

## 2023-02-14 NOTE — ED Notes (Signed)
Pt alert, NAD, calm, interactive, speaking in clear complete sentences, no wheezing at this time, denies questions or needs, "ready to go". Wife present.

## 2023-03-04 NOTE — Progress Notes (Unsigned)
HPI M former smoker followed for dyspnea, cough  Asthma, complicated by HBP, Hypercholesterolemia, PAFib, Lung nodule(2012), Cervical spondylosis, Dysphagia Labs- nl CMET, BNP and Hgb 08/28/2018 Echo 08/28/2018- EF 55-60%, no wall motion abnl or PH PFT 01/15/2019- Minimal obstruction, no resp to BD, Nl lung volumes, Nl Diffusion CTa chest 06/21/19- Aortic atherosclerosis, lungs clear 10/30/19- 425 meters, lowest O2 sat on room air 95%, max HR 80 HST 12/04/19- AHI 11.8/ hr MOSTLY CENTRAL APNEAS, desaturation to 85%/ average 93%, body weight 203 lbs ONOX 08/17/20- 12-13 minutes </= 88%. Likely REM associated. ------------------------------------------------------------------------   02/10/21- 80 yoM former smoker( 6.5 pk yrs) followed for Chronic Bronchitis, dyspnea, cough, hx Nocturnal Hypoxemia, Sleep Apnea/ Central, Asthma, complicated by HBP, Hyperlipidemia, PAFib/ Xarelto, Lung nodule(2012), Cervical spondylosis, Dysphagia, Prostate Cancer, -Proair, Flonase, Symbicort 160/ Anoro sample, Zyrtec Covid vax-3 Phizer Flu vax-had Body weight today-203 lbs Dr Jenne Pane ENT treated for hoarsenesss and hemoptysis with resection of varix and injection of cords for bowing. Voice is better. Relieved to finish XRT for prostate cancer.  Morning cough productive clear mucus, then mostly good through day.  DOE has improved- somewhat random- his PCP questioned "anxiety". Rescue inhaler used only occasionally- does help. Other inhalers including Symbicort don't seem to make a difference.  CXR 08/11/20-  IMPRESSION: Mild bilateral peribronchial cuffing. Bronchitis could present this fashion. Low lung volumes with mild bibasilar atelectasis.  03/06/23-  82 yoM former smoker( 6.5 pk yrs) followed for Chronic Bronchitis, dyspnea, cough, hx Nocturnal Hypoxemia, Sleep Apnea/ Central, Asthma, complicated by HBP, Hyperlipidemia, PAFib/ Eliquis, Lung nodule(2012), Cervical spondylosis, Dysphagia, Prostate  Cancer,GERD, -Proair, Flonase, Breo 200, Neb Duoneb, Zyrtec, clonazepam,  Body weight today- 207 lbs ED 10/8- wheezing- increased morning cough, dyspnea> Zpak, prednisone Felt better for several days after prednisone finished.  Overall says 2-3 months of increased DOE- has to stop briefly walking 100 yrds to mailbox. Now cough productive much clear mucus,  mild wheeze and dyspnea on exertion have returned. Says nebulizer treatments, Breo, Trelegy and Breztri trials have all made little difference.  Has seen cardiology with ECHO> mid PAH and Gr 1 DD. Known AFib. Aware of reflux. Walk Test on Room Air- 03/06/23- 3 laps. Lowest O2 sat 95%, max HR 77/min.  We discussed trying to maintain on low dose prednisone till we can get PFT. CXR- 02/14/23-  IMPRESSION: No active cardiopulmonary disease.   ROS-see HPI  + = positive Constitutional:    weight loss, night sweats, fevers, chills, fatigue, lassitude. HEENT:    headaches, difficulty swallowing, tooth/dental problems, sore throat,       sneezing, itching, ear ache, nasal congestion, post nasal drip, snoring CV:    chest pain, orthopnea, PND, swelling in lower extremities, anasarca,                                   dizziness, palpitations Resp:   +shortness of breath with exertion or at rest.                +productive cough,   +non-productive cough, coughing up of blood.              change in color of mucus.  wheezing.   Skin:    rash or lesions. GI:  No-   heartburn, indigestion, abdominal pain, nausea, vomiting, diarrhea,                 change in bowel habits, loss of  appetite GU: dysuria, change in color of urine, no urgency or frequency.   flank pain. MS:   joint pain, stiffness, decreased range of motion, back pain. Neuro-     nothing unusual Psych:  change in mood or affect.  depression or anxiety.   memory loss.  OBJ- Physical Exam   Arrival room air sat 97% General- Alert, Oriented, Affect-appropriate, Distress- none acute,   Skin- rash-none, lesions- none, excoriation- none Lymphadenopathy- none Head- atraumatic            Eyes- Gross vision intact, PERRLA, conjunctivae and secretions clear            Ears- Hearing, canals-normal            Nose- Clear, no-Septal dev, mucus, polyps, erosion, perforation             Throat- Mallampati II , mucosa clear , drainage- none, tonsils- atrophic,  Neck- flexible , trachea midline, no stridor , thyroid nl, carotid no bruit Chest - symmetrical excursion , unlabored           Heart/CV- RRR faint , no murmur , no gallop  , no rub, nl s1 s2                           - JVD+1 cm , edema- none, stasis changes- none, varices- none           Lung- +clear, wheeze- none, cough- none , dullness-none, rub- none           Chest wall-  Abd-  Br/ Gen/ Rectal- Not done, not indicated Extrem- cyanosis- none, clubbing, none, atrophy- none, strength- nl Neuro- grossly intact to observation

## 2023-03-06 ENCOUNTER — Ambulatory Visit (INDEPENDENT_AMBULATORY_CARE_PROVIDER_SITE_OTHER): Payer: PPO | Admitting: Internal Medicine

## 2023-03-06 ENCOUNTER — Encounter: Payer: Self-pay | Admitting: Internal Medicine

## 2023-03-06 VITALS — BP 138/64 | HR 70 | Ht 70.0 in | Wt 207.0 lb

## 2023-03-06 DIAGNOSIS — R0609 Other forms of dyspnea: Secondary | ICD-10-CM | POA: Diagnosis not present

## 2023-03-06 DIAGNOSIS — J4530 Mild persistent asthma, uncomplicated: Secondary | ICD-10-CM

## 2023-03-06 DIAGNOSIS — K219 Gastro-esophageal reflux disease without esophagitis: Secondary | ICD-10-CM | POA: Diagnosis not present

## 2023-03-06 MED ORDER — PREDNISONE 5 MG PO TABS: ORAL_TABLET | ORAL | 0 refills | Status: DC

## 2023-03-06 NOTE — Patient Instructions (Addendum)
Order- lab- D-dimer-   dx dyspnea on exertion  Order- Walk test on room air     dx dyspnea one exertion  Order- schedule PFT   dx dyspnea on exertion  Script sent to to taking prednisone 5 mg - 2 daily x 1 week, then one daily x 3 weeks  Keep appt December 10

## 2023-03-07 ENCOUNTER — Encounter: Payer: Self-pay | Admitting: Internal Medicine

## 2023-03-07 DIAGNOSIS — R0609 Other forms of dyspnea: Secondary | ICD-10-CM | POA: Insufficient documentation

## 2023-03-07 LAB — D-DIMER, QUANTITATIVE: D-Dimer, Quant: 0.36 ug{FEU}/mL (ref <0.50)

## 2023-03-07 NOTE — Assessment & Plan Note (Signed)
May be contributing to productive cough Needs more attention to reflux precautions

## 2023-03-07 NOTE — Assessment & Plan Note (Signed)
No cough or wheeze on exam today, but he describes daily cough with much clear mucus, esp in AMs. Plan- PFT, D-dimer, prednisone 10 mg daily x 1 week, then 5 mg daily x 3 weeks. Reflux precautions

## 2023-03-07 NOTE — Assessment & Plan Note (Signed)
Has mostly cardiac issues identified so far. Saw no benefit with bronchodilators. Plan- see if low dose prednisone will help cough and wheeze, until we can get PFT. D-dimer.

## 2023-03-16 ENCOUNTER — Telehealth: Payer: Self-pay

## 2023-03-16 NOTE — Telephone Encounter (Signed)
Transition Care Management Follow-up Telephone Call Date of discharge and from where: 02/14/2023 [facility] How have you been since you were released from the hospital? Patient stated he is still having difficulty breathing, still SOB. Any questions or concerns? No  Items Reviewed: Did the pt receive and understand the discharge instructions provided? Yes  Medications obtained and verified? Yes  Other? No  Any new allergies since your discharge? No  Dietary orders reviewed? Yes Do you have support at home? Yes   Follow up appointments reviewed:  PCP Hospital f/u appt confirmed? No  Scheduled to see  on  @ . Specialist Hospital f/u appt confirmed? Yes  Scheduled to see Clinton D. Maple Hudson, MD on 03/06/2023 @ [facility] [place]. Are transportation arrangements needed? No  If their condition worsens, is the pt aware to call PCP or go to the Emergency Dept.? Yes Was the patient provided with contact information for the PCP's office or ED? Yes Was to pt encouraged to call back with questions or concerns? Yes   Jaella Weinert [facility]  [facility], [facility] Guide Direct Dial: [phone]  Website: [email or web address]

## 2023-03-21 DIAGNOSIS — H353211 Exudative age-related macular degeneration, right eye, with active choroidal neovascularization: Secondary | ICD-10-CM | POA: Diagnosis not present

## 2023-03-28 DIAGNOSIS — H353221 Exudative age-related macular degeneration, left eye, with active choroidal neovascularization: Secondary | ICD-10-CM | POA: Diagnosis not present

## 2023-03-31 DIAGNOSIS — H6122 Impacted cerumen, left ear: Secondary | ICD-10-CM | POA: Diagnosis not present

## 2023-03-31 DIAGNOSIS — Z9089 Acquired absence of other organs: Secondary | ICD-10-CM | POA: Insufficient documentation

## 2023-04-03 ENCOUNTER — Ambulatory Visit (INDEPENDENT_AMBULATORY_CARE_PROVIDER_SITE_OTHER): Payer: PPO

## 2023-04-03 DIAGNOSIS — Z0000 Encounter for general adult medical examination without abnormal findings: Secondary | ICD-10-CM | POA: Diagnosis not present

## 2023-04-03 NOTE — Progress Notes (Signed)
Subjective:   Tyler Allison is a 82 y.o. male who presents for Medicare Annual/Subsequent preventive examination.  Visit Complete: Virtual I connected with  Ruffin Frederick on 04/03/23 by a audio enabled telemedicine application and verified that I am speaking with the correct person using two identifiers.  Patient Location: Home  Provider Location: Home Office  I discussed the limitations of evaluation and management by telemedicine. The patient expressed understanding and agreed to proceed.  Vital Signs: Because this visit was a virtual/telehealth visit, some criteria may be missing or patient reported. Any vitals not documented were not able to be obtained and vitals that have been documented are patient reported.  Patient Medicare AWV questionnaire was completed by the patient on 04/03/2023; I have confirmed that all information answered by patient is correct and no changes since this date.        Objective:    There were no vitals filed for this visit. There is no height or weight on file to calculate BMI.     02/14/2023    1:01 PM 02/18/2022    2:25 PM 02/16/2021    1:26 PM 02/11/2021   12:13 PM 10/08/2020    6:05 AM 08/31/2020   10:27 AM 08/05/2020    9:33 AM  Advanced Directives  Does Patient Have a Medical Advance Directive? Yes Yes Yes Yes No No Yes  Type of Advance Directive  Living will Living will;Healthcare Power of [employer] Power of [place];Living will   Healthcare Power of Attorney  Does patient want to make changes to medical advance directive?  Yes (Inpatient - patient requests chaplain consult to change a medical advance directive) No - Patient declined    No - Patient declined  Copy of Healthcare Power of Attorney in Chart?    No - copy requested   No - copy requested  Would patient like information on creating a medical advance directive?    No - Patient declined No - Patient declined No - Patient declined     Current Medications  (verified) Outpatient Encounter Medications as of 04/03/2023  Medication Sig   acetaminophen (TYLENOL) 500 MG tablet Take 500 mg by mouth every 6 (six) hours as needed for moderate pain.   albuterol (PROAIR HFA) 108 (90 Base) MCG/ACT inhaler Inhale 2 puffs into the lungs every 4 (four) hours as needed.   apixaban (ELIQUIS) 5 MG TABS tablet Take 1 tablet (5 mg total) by mouth 2 (two) times daily.   atorvastatin (LIPITOR) 40 MG tablet TAKE 1 TABLET(40 MG) BY MOUTH DAILY   azithromycin (ZITHROMAX) 250 MG tablet Take 1 tablet (250 mg total) by mouth daily. Take first 2 tablets together, then 1 every day until finished.   clonazePAM (KLONOPIN) 0.5 MG tablet TAKE 1 TABLET(0.5 MG) BY MOUTH TWICE DAILY AS NEEDED FOR ANXIETY   cyclobenzaprine (FLEXERIL) 10 MG tablet Take 1 tablet (10 mg total) by mouth 3 (three) times daily as needed for muscle spasms.   fluticasone (FLONASE) 50 MCG/ACT nasal spray instill two SPRAYS in each nostril daily AT night   fluticasone furoate-vilanterol (BREO ELLIPTA) 200-25 MCG/ACT AEPB Inhale 1 puff into the lungs daily.   ipratropium-albuterol (DUONEB) 0.5-2.5 (3) MG/3ML SOLN Take 3 mLs by nebulization every 6 (six) hours as needed for up to 12 doses.   loratadine (CLARITIN) 10 MG tablet Take 1 tablet (10 mg total) by mouth daily.   Melatonin 10 MG TABS Take 10 mg by mouth at bedtime as needed (sleep).  metoprolol succinate (TOPROL-XL) 25 MG 24 hr tablet TAKE ONE TABLET BY MOUTH EVERY EVENING   pantoprazole (PROTONIX) 40 MG tablet TAKE 1 TABLET(40 MG) BY MOUTH TWICE DAILY   predniSONE (DELTASONE) 5 MG tablet 2 daily x 1 week, then one daily x 3 weeks.   tamsulosin (FLOMAX) 0.4 MG CAPS capsule Take 0.4 mg by mouth daily.   traMADol (ULTRAM) 50 MG tablet Take 1 tablet (50 mg total) by mouth at bedtime as needed for moderate pain.   triamcinolone cream (KENALOG) 0.1 % Apply 1 Application topically 2 (two) times daily.   No facility-administered encounter medications on file  as of 04/03/2023.    Allergies (verified) Codeine   History: Past Medical History:  Diagnosis Date   Allergy    Anxiety    Asthma    Cancer (HCC)    prostate cancer   Cervical spondylosis with radiculopathy    left c6 radiculopathy   Chronic back pain    GERD (gastroesophageal reflux disease)    Hyperlipidemia    Hypertension    PAF (paroxysmal atrial fibrillation) (HCC)    Prostate cancer (HCC)    Past Surgical History:  Procedure Laterality Date   BACK SURGERY     MICROLARYNGOSCOPY W/VOCAL CORD INJECTION N/A 10/30/2020   Procedure: MICRODIRECT LARYNGOSCOPY WITH VOCAL CORD INJECTION AND CO2 LASER APPLICATION;  Surgeon: Christia Reading, MD;  Location: [facility] OR;  Service: ENT;  Laterality: N/A;   PROSTATE BIOPSY     SPINE SURGERY     back surgery x 3 including lumbar fusion, neck surgery x 2 including C4-6 ACDF   tympanoplasty with mastoidectomy Left 01/13/2004   Family History  Problem Relation Age of Onset   Heart failure Mother    Esophageal cancer Father    Heart attack Maternal Uncle    Breast cancer Neg Hx    Prostate cancer Neg Hx    Pancreatic cancer Neg Hx    Colon cancer Neg Hx    Social History   Socioeconomic History   Marital status: Married    Spouse name: Not on file   Number of children: 2   Years of education: Not on file   Highest education level: Not on file  Occupational History    Comment: retired  Tobacco Use   Smoking status: Former    Current packs/day: 0.00    Average packs/day: 0.5 packs/day for 13.5 years (6.7 ttl pk-yrs)    Types: Cigarettes    Start date: 65    Quit date: 10/23/1968    Years since quitting: 54.4   Smokeless tobacco: Never  Vaping Use   Vaping status: Never Used  Substance and Sexual Activity   Alcohol use: No   Drug use: No   Sexual activity: Not Currently    Comment: married, retired.  Other Topics Concern   Not on file  Social History Narrative   2 children but 1 deceased   Social Determinants of  Health   Financial Resource Strain: Medium Risk (04/03/2023)   Overall Financial Resource Strain (CARDIA)    Difficulty of Paying Living Expenses: Somewhat hard  Food Insecurity: No Food Insecurity (04/03/2023)   Hunger Vital Sign    Worried About Running Out of Food in the Last Year: Never true    Ran Out of Food in the Last Year: Never true  Transportation Needs: No Transportation Needs (04/03/2023)   PRAPARE - Administrator, Civil Service (Medical): No    Lack of Transportation (Non-Medical): No  Physical Activity: Insufficiently Active (04/03/2023)   Exercise Vital Sign    Days of Exercise per Week: 3 days    Minutes of Exercise per Session: 10 min  Stress: No Stress Concern Present (04/03/2023)   [employer] of Occupational Health - Occupational Stress Questionnaire    Feeling of Stress : Not at all  Social Connections: Socially Integrated (04/03/2023)   Social Connection and Isolation Panel [NHANES]    Frequency of Communication with Friends and Family: Three times a week    Frequency of Social Gatherings with Friends and Family: Once a week    Attends Religious Services: More than 4 times per year    Active Member of Clubs or Organizations: Yes    Attends Engineer, structural: More than 4 times per year    Marital Status: Married    Tobacco Counseling Counseling given: Not Answered   Clinical Intake:  Pre-visit preparation completed: Yes  Pain : No/denies pain     BMI - recorded: 29.7 Nutritional Status: BMI 25 -29 Overweight Nutritional Risks: None Diabetes: No  How often do you need to have someone help you when you read instructions, pamphlets, or other written materials from your doctor or pharmacy?: 1 - Never  Interpreter Needed?: No      Activities of Daily Living     No data to display          Patient Care Team: Donita Brooks, MD as PCP - General (Family Medicine) Corky Crafts, MD as PCP - Cardiology  (Cardiology) Erroll Luna, [facility] (Inactive) as Pharmacist (Pharmacist) Felicita Gage, RN as Oncology Nurse Navigator Margaretmary Dys, MD as Consulting Physician (Radiation Oncology) Axel Filler, Larna Daughters, NP as Nurse Practitioner (Hematology and Oncology) Maryclare Labrador, RN as Registered Nurse  Indicate any recent Medical Services you may have received from other than [facility] providers in the past year (date may be approximate).     Assessment:   This is a routine wellness examination for Donte.  Hearing/Vision screen No results found.   Goals Addressed             This Visit's Progress    Exercise 3x per week (30 min per time)        Depression Screen    04/03/2023    2:41 PM 01/31/2023    5:17 PM 01/02/2023   12:24 PM 11/08/2022    2:13 PM 05/24/2022   11:10 AM 02/18/2022    2:22 PM 04/19/2021    3:04 PM  PHQ 2/9 Scores  PHQ - 2 Score 0 0 0 4 0 1 0  PHQ- 9 Score  0 0 8   2    Fall Risk    04/03/2023    2:45 PM 01/31/2023    5:17 PM 01/02/2023   12:24 PM 11/08/2022    2:13 PM 05/24/2022   11:10 AM  Fall Risk   Falls in the past year? 0 0 0 0 0  Number falls in past yr: 0 0 0 0 0  Injury with Fall? 0 0 0 0 0  Risk for fall due to : No Fall Risks No Fall Risks No Fall Risks  No Fall Risks  Follow up Falls prevention discussed Falls prevention discussed Falls prevention discussed  Falls prevention discussed    MEDICARE RISK AT HOME:    TIMED UP AND GO:  Was the test performed?  No    Cognitive Function:        04/03/2023  2:45 PM 02/18/2022    2:32 PM 02/11/2021   12:16 PM 03/23/2020    9:16 AM  6CIT Screen  What Year? 0 points 0 points 0 points 0 points  What month? 0 points 0 points 0 points 0 points  What time? 0 points 0 points 0 points 0 points  Count back from 20 0 points 0 points 0 points 0 points  Months in reverse 0 points 0 points 0 points 0 points  Repeat phrase 0 points 0 points 0 points 0 points  Total Score 0 points 0 points 0 points  0 points    Immunizations Immunization History  Administered Date(s) Administered   Fluad Quad(high Dose 65+) 12/28/2018, 01/30/2020, 03/03/2022   Fluad Trivalent(High Dose 65+) 01/31/2023   Influenza Whole 02/09/2010   Influenza, High Dose Seasonal PF 02/15/2017, 02/01/2018   Influenza,inj,Quad PF,6+ Mos 02/13/2013, 02/25/2014, 03/13/2015, 02/11/2016, 01/19/2021   Influenza,inj,quad, With Preservative 02/06/2018   Influenza-Unspecified 03/19/2021   PFIZER(Purple Top)SARS-COV-2 Vaccination 06/20/2019, 07/13/2019, 05/11/2020   Pneumococcal Conjugate-13 03/14/2014   Pneumococcal Polysaccharide-23 05/09/2004, 12/05/2017   Td 01/07/2005   Zoster, Live 08/08/2006    TDAP status: Due, Education has been provided regarding the importance of this vaccine. Advised may receive this vaccine at local pharmacy or Health Dept. Aware to provide a copy of the vaccination record if obtained from local pharmacy or Health Dept. Verbalized acceptance and understanding.  Flu Vaccine status: Up to date  Pneumococcal vaccine status: Up to date  Covid-19 vaccine status: Completed vaccines  Qualifies for Shingles Vaccine? Yes   Zostavax completed No   Shingrix Completed?: No.    Education has been provided regarding the importance of this vaccine. Patient has been advised to call insurance company to determine out of pocket expense if they have not yet received this vaccine. Advised may also receive vaccine at local pharmacy or Health Dept. Verbalized acceptance and understanding.  Screening Tests Health Maintenance  Topic Date Due   Zoster Vaccines- Shingrix (1 of 2) 04/04/2023 (Originally 01/03/1960)   COVID-19 Vaccine (4 - 2023-24 season) 04/28/2023 (Originally 01/08/2023)   Medicare Annual Wellness (AWV)  04/02/2024   Pneumonia Vaccine 61+ Years old  Completed   INFLUENZA VACCINE  Completed   HPV VACCINES  Aged Out   DTaP/Tdap/Td  Discontinued    Health Maintenance  There are no preventive care  reminders to display for this patient.   Colorectal cancer screening: No longer required.   Lung Cancer Screening: (Low Dose CT Chest recommended if Age 73-80 years, 20 pack-year currently smoking OR have quit w/in 15years.) does not qualify.   Lung Cancer Screening Referral: N/A  Additional Screening:  Hepatitis C Screening: does not qualify; Completed N/A  Vision Screening: Recommended annual ophthalmology exams for early detection of glaucoma and other disorders of the eye. Is the patient up to date with their annual eye exam?   N/A Who is the provider or what is the name of the office in which the patient attends annual eye exams? N/A If pt is not established with a provider, would they like to be referred to a provider to establish care? No .   Dental Screening: Recommended annual dental exams for proper oral hygiene  Diabetic Foot Exam: PT IS NOT DIABETIC  Community Resource Referral / Chronic Care Management: CRR required this visit?  No   CCM required this visit?  No     Plan:     I have personally reviewed and noted the following in the patient's chart:  Medical and social history Use of alcohol, tobacco or illicit drugs  Current medications and supplements including opioid prescriptions. Patient is not currently taking opioid prescriptions. Functional ability and status Nutritional status Physical activity Advanced directives List of other physicians Hospitalizations, surgeries, and ER visits in previous 12 months Vitals Screenings to include cognitive, depression, and falls Referrals and appointments  In addition, I have reviewed and discussed with patient certain preventive protocols, quality metrics, and best practice recommendations. A written personalized care plan for preventive services as well as general preventive health recommendations were provided to patient.     Darral Dash, LPN   [id number]   After Visit Summary: (Mail) Due to this  being a telephonic visit, the after visit summary with patients personalized plan was offered to patient via mail   Nurse Notes: Discuss Tdap and Shingles vaccines with pt and how to obtain.

## 2023-04-05 DIAGNOSIS — L570 Actinic keratosis: Secondary | ICD-10-CM | POA: Diagnosis not present

## 2023-04-05 DIAGNOSIS — D2262 Melanocytic nevi of left upper limb, including shoulder: Secondary | ICD-10-CM | POA: Diagnosis not present

## 2023-04-05 DIAGNOSIS — D225 Melanocytic nevi of trunk: Secondary | ICD-10-CM | POA: Diagnosis not present

## 2023-04-05 DIAGNOSIS — L820 Inflamed seborrheic keratosis: Secondary | ICD-10-CM | POA: Diagnosis not present

## 2023-04-05 DIAGNOSIS — D2261 Melanocytic nevi of right upper limb, including shoulder: Secondary | ICD-10-CM | POA: Diagnosis not present

## 2023-04-05 DIAGNOSIS — L578 Other skin changes due to chronic exposure to nonionizing radiation: Secondary | ICD-10-CM | POA: Diagnosis not present

## 2023-04-05 DIAGNOSIS — L2989 Other pruritus: Secondary | ICD-10-CM | POA: Diagnosis not present

## 2023-04-18 ENCOUNTER — Ambulatory Visit: Payer: PPO | Admitting: Internal Medicine

## 2023-04-19 ENCOUNTER — Encounter: Payer: Self-pay | Admitting: Family Medicine

## 2023-04-19 ENCOUNTER — Ambulatory Visit (INDEPENDENT_AMBULATORY_CARE_PROVIDER_SITE_OTHER): Payer: PPO | Admitting: Family Medicine

## 2023-04-19 VITALS — BP 138/88 | HR 64 | Temp 98.3°F | Ht 70.0 in | Wt 206.1 lb

## 2023-04-19 DIAGNOSIS — R519 Headache, unspecified: Secondary | ICD-10-CM | POA: Diagnosis not present

## 2023-04-19 NOTE — Progress Notes (Signed)
Subjective:  HPI: Tyler Allison is a 82 y.o. male presenting on 04/19/2023 for Headache (Was seen yesterday by EMS. Pt wife was concerned about "headache stroke" Pt reports EMS checked sugar and BP. Headache was in the back of his head. )   HPI Patient is in today for recent headache with dizziness and unsteadiness yesterday. This started yesterday before lunch when he stood up after his bible study. He noticed neck stiffness and headache and felt dizzy. He was seen by EMS yesterday as his wife was concerned he was having a stroke, no concerning findings when evaluated by EMS and he did not proceed to hospital. He reports a history of ear infections and is followed by ENT for this. His dizziness and unsteadiness is resolved but he continues to have a mild headache and neck stiffness. He has had a similar headache in the past and has a history of ruptured discs with fusion of his cervical spine. Has tried nothing. He denies fever, body aches, limited ROM in neck, thunderclap headache, vision changes, confusion, difficulty speaking or understanding others, unilateral weakness, or sensory chagnes.  Review of Systems  All other systems reviewed and are negative.   Relevant past medical history reviewed and updated as indicated.   Past Medical History:  Diagnosis Date   Allergy    Anxiety    Asthma    Cancer (HCC)    prostate cancer   Cervical spondylosis with radiculopathy    left c6 radiculopathy   Chronic back pain    GERD (gastroesophageal reflux disease)    Hyperlipidemia    Hypertension    PAF (paroxysmal atrial fibrillation) (HCC)    Prostate cancer (HCC)      Past Surgical History:  Procedure Laterality Date   BACK SURGERY     MICROLARYNGOSCOPY W/VOCAL CORD INJECTION N/A 10/30/2020   Procedure: MICRODIRECT LARYNGOSCOPY WITH VOCAL CORD INJECTION AND CO2 LASER APPLICATION;  Surgeon: Christia Reading, MD;  Location[facility] OR;  Service: ENT;  Laterality: N/A;   PROSTATE BIOPSY      SPINE SURGERY     back surgery x 3 including lumbar fusion, neck surgery x 2 including C4-6 ACDF   tympanoplasty with mastoidectomy Left 01/13/2004    Allergies and medications reviewed and updated.   Current Outpatient Medications:    acetaminophen (TYLENOL) 500 MG tablet, Take 500 mg by mouth every 6 (six) hours as needed for moderate pain., Disp: , Rfl:    albuterol (PROAIR HFA) 108 (90 Base) MCG/ACT inhaler, Inhale 2 puffs into the lungs every 4 (four) hours as needed., Disp: 8.5 g, Rfl: 11   apixaban (ELIQUIS) 5 MG TABS tablet, Take 1 tablet (5 mg total) by mouth 2 (two) times daily., Disp: 180 tablet, Rfl: 3   atorvastatin (LIPITOR) 40 MG tablet, TAKE 1 TABLET(40 MG) BY MOUTH DAILY, Disp: 90 tablet, Rfl: 3   clonazePAM (KLONOPIN) 0.5 MG tablet, TAKE 1 TABLET(0.5 MG) BY MOUTH TWICE DAILY AS NEEDED FOR ANXIETY, Disp: 60 tablet, Rfl: 1   cyclobenzaprine (FLEXERIL) 10 MG tablet, Take 1 tablet (10 mg total) by mouth 3 (three) times daily as needed for muscle spasms., Disp: 30 tablet, Rfl: 0   fluticasone (FLONASE) 50 MCG/ACT nasal spray, instill two SPRAYS in each nostril daily AT night, Disp: 16 g, Rfl: 6   Melatonin 10 MG TABS, Take 10 mg by mouth at bedtime as needed (sleep)., Disp: , Rfl:    metoprolol succinate (TOPROL-XL) 25 MG 24 hr tablet, TAKE ONE TABLET BY  MOUTH EVERY EVENING, Disp: 90 tablet, Rfl: 3   pantoprazole (PROTONIX) 40 MG tablet, TAKE 1 TABLET(40 MG) BY MOUTH TWICE DAILY, Disp: 60 tablet, Rfl: 0   tamsulosin (FLOMAX) 0.4 MG CAPS capsule, Take 0.4 mg by mouth daily., Disp: , Rfl:    traMADol (ULTRAM) 50 MG tablet, Take 1 tablet (50 mg total) by mouth at bedtime as needed for moderate pain., Disp: 30 tablet, Rfl: 0   triamcinolone cream (KENALOG) 0.1 %, Apply 1 Application topically 2 (two) times daily., Disp: 30 g, Rfl: 0   azithromycin (ZITHROMAX) 250 MG tablet, Take 1 tablet (250 mg total) by mouth daily. Take first 2 tablets together, then 1 every day until finished.  (Patient not taking: Reported on 04/19/2023), Disp: 6 tablet, Rfl: 0   fluticasone furoate-vilanterol (BREO ELLIPTA) 200-25 MCG/ACT AEPB, Inhale 1 puff into the lungs daily. (Patient not taking: Reported on 04/19/2023), Disp: 1 each, Rfl: 5   ipratropium-albuterol (DUONEB) 0.5-2.5 (3) MG/3ML SOLN, Take 3 mLs by nebulization every 6 (six) hours as needed for up to 12 doses. (Patient not taking: Reported on 04/19/2023), Disp: 36 mL, Rfl: 0   loratadine (CLARITIN) 10 MG tablet, Take 1 tablet (10 mg total) by mouth daily. (Patient not taking: Reported on 04/19/2023), Disp: 30 tablet, Rfl: 5   predniSONE (DELTASONE) 5 MG tablet, 2 daily x 1 week, then one daily x 3 weeks. (Patient not taking: Reported on 04/19/2023), Disp: 50 tablet, Rfl: 0  Allergies  Allergen Reactions   Codeine Nausea And Vomiting    Objective:   BP 138/88 (BP Location: Left Arm)   Pulse 64   Temp 98.3 F (36.8 C)   Ht 5\' 10"  (1.778 m)   Wt 206 lb 2 oz (93.5 kg)   SpO2 99%   BMI 29.58 kg/m      04/19/2023    2:27 PM 03/06/2023    3:28 PM 02/14/2023    3:40 PM  Vitals with BMI  Height 5\' 10"  5\' 10"    Weight 206 lbs 2 oz 207 lbs   BMI 29.58 29.7   Systolic 138 138   Diastolic 88 64   Pulse 64 70 77     Physical Exam Vitals and nursing note reviewed.  Constitutional:      Appearance: Normal appearance. He is normal weight.  HENT:     Head: Normocephalic and atraumatic.     Right Ear: Tympanic membrane, ear canal and external ear normal.     Left Ear: Ear canal and external ear normal. Tympanic membrane is perforated.  Eyes:     General: No visual field deficit. Musculoskeletal:     Cervical back: Normal range of motion. No rigidity.  Skin:    General: Skin is warm and dry.     Capillary Refill: Capillary refill takes less than 2 seconds.  Neurological:     General: No focal deficit present.     Mental Status: He is alert and oriented to person, place, and time. Mental status is at baseline.     GCS:  GCS eye subscore is 4. GCS verbal subscore is 5. GCS motor subscore is 6.     Cranial Nerves: No cranial nerve deficit, dysarthria or facial asymmetry.     Sensory: No sensory deficit.     Motor: No weakness.     Coordination: Coordination normal.  Psychiatric:        Mood and Affect: Mood normal.        Behavior: Behavior normal.  Thought Content: Thought content normal.        Judgment: Judgment normal.     Assessment & Plan:  Acute nonintractable headache, unspecified headache type Assessment & Plan: No red flags on exam. Headache is improving and dizziness has resolved. Suspicious for MSK in setting of poor ergonomics for an extended time on his computer. May use Flexeril and Tylenol PRN and if symptoms not relieved return to office. Discussed concerning symptoms and when to seek immediate medical care including worsening severity of headache, return of dizziness, fever, unilateral weakness, difficulty speaking, confusion, facial weakness.       Follow up plan: Return if symptoms worsen or fail to improve.  Park Meo, FNP

## 2023-04-19 NOTE — Assessment & Plan Note (Signed)
No red flags on exam. Headache is improving and dizziness has resolved. Suspicious for MSK in setting of poor ergonomics for an extended time on his computer. May use Flexeril and Tylenol PRN and if symptoms not relieved return to office. Discussed concerning symptoms and when to seek immediate medical care including worsening severity of headache, return of dizziness, fever, unilateral weakness, difficulty speaking, confusion, facial weakness.

## 2023-04-26 DIAGNOSIS — H95192 Other disorders following mastoidectomy, left ear: Secondary | ICD-10-CM | POA: Diagnosis not present

## 2023-05-01 ENCOUNTER — Ambulatory Visit: Payer: PPO | Admitting: Internal Medicine

## 2023-05-01 DIAGNOSIS — R0609 Other forms of dyspnea: Secondary | ICD-10-CM

## 2023-05-01 LAB — PULMONARY FUNCTION TEST
DL/VA % pred: 92 %
DL/VA: 3.54 ml/min/mmHg/L
DLCO cor % pred: 74 %
DLCO cor: 17.97 ml/min/mmHg
DLCO unc % pred: 74 %
DLCO unc: 17.97 ml/min/mmHg
FEF 25-75 Post: 1.85 L/s
FEF 25-75 Pre: 1.37 L/s
FEF2575-%Change-Post: 34 %
FEF2575-%Pred-Post: 101 %
FEF2575-%Pred-Pre: 75 %
FEV1-%Change-Post: 7 %
FEV1-%Pred-Post: 86 %
FEV1-%Pred-Pre: 79 %
FEV1-Post: 2.36 L
FEV1-Pre: 2.19 L
FEV1FVC-%Change-Post: 0 %
FEV1FVC-%Pred-Pre: 100 %
FEV6-%Change-Post: 7 %
FEV6-%Pred-Post: 90 %
FEV6-%Pred-Pre: 84 %
FEV6-Post: 3.28 L
FEV6-Pre: 3.06 L
FEV6FVC-%Change-Post: 0 %
FEV6FVC-%Pred-Post: 107 %
FEV6FVC-%Pred-Pre: 107 %
FVC-%Change-Post: 8 %
FVC-%Pred-Post: 84 %
FVC-%Pred-Pre: 78 %
FVC-Post: 3.31 L
FVC-Pre: 3.06 L
Post FEV1/FVC ratio: 71 %
Post FEV6/FVC ratio: 100 %
Pre FEV1/FVC ratio: 72 %
Pre FEV6/FVC Ratio: 100 %
RV % pred: 140 %
RV: 3.81 L
TLC % pred: 102 %
TLC: 7.26 L

## 2023-05-01 NOTE — Patient Instructions (Signed)
Full PFT performed today. °

## 2023-05-01 NOTE — Progress Notes (Signed)
Full PFT performed today. °

## 2023-05-03 NOTE — Progress Notes (Unsigned)
HPI M former smoker followed for dyspnea, cough  Asthma, complicated by HBP, Hypercholesterolemia, PAFib, Lung nodule(2012), Cervical spondylosis, Dysphagia Labs- nl CMET, BNP and Hgb 08/28/2018 Echo 08/28/2018- EF 55-60%, no wall motion abnl or PH PFT 01/15/2019- Minimal obstruction, no resp to BD, Nl lung volumes, Nl Diffusion CTa chest 06/21/19- Aortic atherosclerosis, lungs clear 10/30/19- 425 meters, lowest O2 sat on room air 95%, max HR 80 HST 12/04/19- AHI 11.8/ hr MOSTLY CENTRAL APNEAS, desaturation to 85%/ average 93%, body weight 203 lbs ONOX 08/17/20- 12-13 minutes </= 88%. Likely REM associated. Walk Test on Room Air- 03/06/23- 3 laps. Lowest O2 sat 95%, max HR 77/min.  ------------------------------------------------------------------------ .  03/06/23-  82 yoM former smoker( 6.5 pk yrs) followed for Chronic Bronchitis, dyspnea, cough, hx Nocturnal Hypoxemia, Sleep Apnea/ Central, Asthma, complicated by HBP, Hyperlipidemia, PAFib/ Eliquis, Lung nodule(2012), Cervical spondylosis, Dysphagia, Prostate Cancer,GERD, -Proair, Flonase, Breo 200, Neb Duoneb, Zyrtec, clonazepam,  Body weight today- 207 lbs ED 10/8- wheezing- increased morning cough, dyspnea> Zpak, prednisone Felt better for several days after prednisone finished.  Overall says 2-3 months of increased DOE- has to stop briefly walking 100 yrds to mailbox. Now cough productive much clear mucus,  mild wheeze and dyspnea on exertion have returned. Says nebulizer treatments, Breo, Trelegy and Breztri trials have all made little difference.  Has seen cardiology with ECHO> mid PAH and Gr 1 DD. Known AFib. Aware of reflux. Walk Test on Room Air- 03/06/23- 3 laps. Lowest O2 sat 95%, max HR 77/min.  We discussed trying to maintain on low dose prednisone till we can get PFT. CXR- 02/14/23-  IMPRESSION: No active cardiopulmonary disease.  05/04/23-  82 yoM former smoker( 6.5 pk yrs) followed for Chronic Bronchitis, dyspnea, cough,  hx Nocturnal Hypoxemia, Sleep Apnea/ Central, Asthma, complicated by HBP, Hyperlipidemia, PAFib/ Eliquis, Lung nodule(2012), Cervical spondylosis, Dysphagia, Prostate Cancer,GERD, -Proair, Flonase, Breo 200, Neb Duoneb, Zyrtec, clonazepam,  Body weight today-  PFT 05/01/23-Spirometry flows within lower limits of normal,  Minimal Diffusion Defect Discussed the use of AI scribe software for clinical note transcription with the patient, who gave verbal consent to proceed.  History of Present Illness   The patient, with a history of atrial fibrillation and macular degenerative disease, presents with ongoing shortness of breath, heavy legs, and back pain. The shortness of breath has been a long-standing issue for the past two to three years, and various inhalers have been tried with little improvement. The patient also reports that his legs feel heavy and tired, especially when trying to walk fast or climb stairs. The patient describes the sensation in his legs as not a regular pain, but more like a lump or something in it. The patient also reports that his back pain has been preventing him from walking as much as he used to, which he believes may be contributing to his deconditioned state. The patient also notes that his blood pressure has been higher than normal in recent checks. We reviewed recent tests, including ECHO with Gr1DD, Minimal anemia (Hgb 12.9), Mild DLCO defect on PFT. Question possibility of PAD with some claudication. Walking for exercise was noted curtailed by back pain 2 years ago- deconditioning may be significant limiting factor. He talks about getting a back brace, and I mentioned water aerobics.     ROS-see HPI  + = positive Constitutional:    weight loss, night sweats, fevers, chills, fatigue, lassitude. HEENT:    headaches, difficulty swallowing, tooth/dental problems, sore throat,  sneezing, itching, ear ache, nasal congestion, post nasal drip, snoring CV:    chest pain,  orthopnea, PND, swelling in lower extremities, anasarca,                                   dizziness, palpitations Resp:   +shortness of breath with exertion or at rest.                +productive cough,   +non-productive cough, coughing up of blood.              change in color of mucus.  wheezing.   Skin:    rash or lesions. GI:  No-   heartburn, indigestion, abdominal pain, nausea, vomiting, diarrhea,                 change in bowel habits, loss of appetite GU: dysuria, change in color of urine, no urgency or frequency.   flank pain. MS:   joint pain, stiffness, decreased range of motion, back pain. Neuro-     nothing unusual Psych:  change in mood or affect.  depression or anxiety.   memory loss.  OBJ- Physical Exam   Arrival room air sat 97% General- Alert, Oriented, Affect-appropriate, Distress- none acute,  Skin- rash-none, lesions- none, excoriation- none Lymphadenopathy- none Head- atraumatic            Eyes- Gross vision intact, PERRLA, conjunctivae and secretions clear            Ears- Hearing, canals-normal            Nose- Clear, no-Septal dev, mucus, polyps, erosion, perforation             Throat- Mallampati II , mucosa clear , drainage- none, tonsils- atrophic,  Neck- flexible , trachea midline, no stridor , thyroid nl, carotid no bruit Chest - symmetrical excursion , unlabored           Heart/CV- RRR faint , no murmur , no gallop  , no rub, nl s1 s2                           - JVD+1 cm , edema- none, stasis changes- none, varices- none           Lung- +clear, wheeze- none, cough- none , dullness-none, rub- none           Chest wall-  Abd-  Br/ Gen/ Rectal- Not done, not indicated Extrem- cyanosis- none, clubbing, none, atrophy- none, strength- nl Neuro- grossly intact to observation  Assessment and Plan    Shortness of Breath Likely multifactorial with contributions from deconditioning, possible peripheral arterial disease, and lung function. Minimal anemia noted.  Previous inhalers (Breo, Trelegy, Breztri) provided minimal relief. -Trial of Spiriva inhaler, 2 puffs daily.  Hypertension Recent elevated blood pressure readings compared to usual baseline. -Monitor blood pressure.  Back Pain Chronic issue, limiting physical activity and contributing to deconditioning. No peripheral neuropathy reported. -Consider use of back brace for support. -Consider water aerobics if available.  Atrial Fibrillation Currently on Eliquis. Recent rhythm checks have been in sinus rhythm. -Continue Eliquis as prescribed.  Peripheral Arterial Disease Complaints of heavy, tired legs and non-specific pain. No significant edema noted. -Discuss with new cardiologist when established for further evaluation.

## 2023-05-04 ENCOUNTER — Encounter: Payer: Self-pay | Admitting: Internal Medicine

## 2023-05-04 ENCOUNTER — Ambulatory Visit: Payer: PPO | Admitting: Internal Medicine

## 2023-05-04 VITALS — BP 140/80 | HR 81 | Ht 70.0 in | Wt 211.0 lb

## 2023-05-04 DIAGNOSIS — R0609 Other forms of dyspnea: Secondary | ICD-10-CM

## 2023-05-04 NOTE — Patient Instructions (Signed)
Order- sample x 2 Spiriva 2.5    inhale 2 puffs daily   see if this makes any difference with your shortness of breath  Any exercise that you can do to resume some exercise will help your stamina.  You cardiologist can consider whether your minimal anemia and minimal abnormality on your lung function test would contribute to your shortness of breath. Also they can consider if you have any peripheral arterial disease.

## 2023-05-11 DIAGNOSIS — M25551 Pain in right hip: Secondary | ICD-10-CM | POA: Diagnosis not present

## 2023-05-11 DIAGNOSIS — M545 Low back pain, unspecified: Secondary | ICD-10-CM | POA: Diagnosis not present

## 2023-05-11 DIAGNOSIS — M160 Bilateral primary osteoarthritis of hip: Secondary | ICD-10-CM | POA: Diagnosis not present

## 2023-05-11 DIAGNOSIS — M25552 Pain in left hip: Secondary | ICD-10-CM | POA: Diagnosis not present

## 2023-05-14 DIAGNOSIS — M1612 Unilateral primary osteoarthritis, left hip: Secondary | ICD-10-CM | POA: Insufficient documentation

## 2023-05-17 DIAGNOSIS — H353211 Exudative age-related macular degeneration, right eye, with active choroidal neovascularization: Secondary | ICD-10-CM | POA: Diagnosis not present

## 2023-05-22 DIAGNOSIS — D1720 Benign lipomatous neoplasm of skin and subcutaneous tissue of unspecified limb: Secondary | ICD-10-CM | POA: Diagnosis not present

## 2023-05-22 DIAGNOSIS — M654 Radial styloid tenosynovitis [de Quervain]: Secondary | ICD-10-CM | POA: Diagnosis not present

## 2023-05-22 DIAGNOSIS — M65332 Trigger finger, left middle finger: Secondary | ICD-10-CM | POA: Diagnosis not present

## 2023-05-23 DIAGNOSIS — D1720 Benign lipomatous neoplasm of skin and subcutaneous tissue of unspecified limb: Secondary | ICD-10-CM | POA: Insufficient documentation

## 2023-05-24 ENCOUNTER — Telehealth: Payer: Self-pay

## 2023-05-24 NOTE — Telephone Encounter (Signed)
 Copied from CRM [id number]. Topic: Clinical - Medical Advice >> May 24, 2023  2:59 PM Jorie Newness J wrote: Reason for CRM: Pt states he is having surgery on his wrist 01/30 and wants to know if there will be a problem with him taking eliquis  before/after his surgery. Pt is requesting a callback [phone]

## 2023-05-26 DIAGNOSIS — C4442 Squamous cell carcinoma of skin of scalp and neck: Secondary | ICD-10-CM | POA: Diagnosis not present

## 2023-05-26 DIAGNOSIS — D485 Neoplasm of uncertain behavior of skin: Secondary | ICD-10-CM | POA: Diagnosis not present

## 2023-05-26 DIAGNOSIS — L244 Irritant contact dermatitis due to drugs in contact with skin: Secondary | ICD-10-CM | POA: Diagnosis not present

## 2023-05-29 NOTE — Progress Notes (Unsigned)
Cardiology Clinic Note   Patient Name: Tyler Allison Date of Encounter: 05/31/2023  Primary Care Provider:  Donita Brooks, MD Primary Cardiologist:  Lance Muss, MD  Patient Profile    Tyler Allison 83 year old male presents the clinic today for follow-up evaluation of his paroxysmal atrial fibrillation, DOE, and hypertension.  Past Medical History    Past Medical History:  Diagnosis Date   Allergy    Anxiety    Asthma    Cancer (HCC)    prostate cancer   Cervical spondylosis with radiculopathy    left c6 radiculopathy   Chronic back pain    GERD (gastroesophageal reflux disease)    Hyperlipidemia    Hypertension    PAF (paroxysmal atrial fibrillation) (HCC)    Prostate cancer (HCC)    Past Surgical History:  Procedure Laterality Date   BACK SURGERY     MICROLARYNGOSCOPY W/VOCAL CORD INJECTION N/A 10/30/2020   Procedure: MICRODIRECT LARYNGOSCOPY WITH VOCAL CORD INJECTION AND CO2 LASER APPLICATION;  Surgeon: Christia Reading, MD;  Location: [facility] OR;  Service: ENT;  Laterality: N/A;   PROSTATE BIOPSY     SPINE SURGERY     back surgery x 3 including lumbar fusion, neck surgery x 2 including C4-6 ACDF   tympanoplasty with mastoidectomy Left 01/13/2004    Allergies  Allergies  Allergen Reactions   Codeine Nausea And Vomiting    History of Present Illness    Tyler Allison has a PMH of paroxysmal A-fib, hyperlipidemia, persistent shortness of breath (previously felt to be due to deconditioning), prior tobacco abuse, and central apnea.  He was found to have an irregular heartbeat during neck surgery.  He was placed on the monitor which showed atrial fibrillation.  He was noted to have nocturnal bradycardia with 2-second pauses.  He was placed on Xarelto.  Echocardiogram 4/20 showed an LVEF of 55-60%, and no wall motion abnormalities.   He was seen in follow-up by Dr.Varanasi 4/22.  He was limited in his walking due to joint pain.  It was felt that his  shortness of breath was due to deconditioning.  He did note improved breathing with the addition of Symbicort.  He was doing yard work at that time and denied chest pain and significant breathing issues.  He denied lower extremity swelling and bleeding issues.  He was seen in follow-up by Jari Favre, PA-C on 10/10/2022.  During that time he was having some shortness of breath.  He was following with pulmonology.  They had tried a couple different medications and were not having much success.  He reported shortness of breath with exertion.  He had tried to go to the gym a couple times per week but was limited in his physical activity.  He was noted to be in normal sinus rhythm.  He was on Eliquis and reported that he was about to enter the donut hole.  He was given patient assistance paperwork to fill out.  He planned to have repeat lipids and LFTs with his PCP in a few months.  He denied chest pain, pressure, tightness.  He denied lower extremity swelling and palpitations.  He presents to the clinic today for follow-up evaluation and states he feels that his breathing is becoming worse.  He was seen by pulmonology who did not feel that his lung issues were in line with his shortness of breath.  Pulmonology recommended that he return to cardiology for follow-up testing.  I reviewed options of coronary CTA  and nuclear stress testing.  He expressed understanding.  He wishes to contact his insurance to evaluate cost of both tests and then proceed.  We reviewed his blood pressure, hyperlipidemia, and previous echocardiogram.  He expressed understanding.  We also reviewed his previous cardiac event monitor.  I will plan follow-up after testing.  He will contact the office to let us know which test he wishes to proceed with.  Today he denies chest pain,  lower extremity edema, fatigue, palpitations, melena, hematuria, hemoptysis, diaphoresis, weakness, presyncope, syncope, orthopnea, and PND.  Home Medications     Prior to Admission medications   Medication Sig Start Date End Date Taking? Authorizing Provider  acetaminophen (TYLENOL) 500 MG tablet Take 500 mg by mouth every 6 (six) hours as needed for moderate pain.    [provider]  albuterol (PROAIR HFA) 108 (90 Base) MCG/ACT inhaler Inhale 2 puffs into the lungs every 4 (four) hours as needed. 11/21/22   Waymon Budge, MD  apixaban (ELIQUIS) 5 MG TABS tablet Take 1 tablet (5 mg total) by mouth 2 (two) times daily. 12/30/22   Donita Brooks, MD  atorvastatin (LIPITOR) 40 MG tablet TAKE 1 TABLET(40 MG) BY MOUTH DAILY 05/30/22   Donita Brooks, MD  clonazePAM (KLONOPIN) 0.5 MG tablet TAKE 1 TABLET(0.5 MG) BY MOUTH TWICE DAILY AS NEEDED FOR ANXIETY 10/25/19   Donita Brooks, MD  cyclobenzaprine (FLEXERIL) 10 MG tablet Take 1 tablet (10 mg total) by mouth 3 (three) times daily as needed for muscle spasms. 11/22/22   Donita Brooks, MD  fluticasone Aleda Grana) 50 MCG/ACT nasal spray instill two SPRAYS in each nostril daily AT night 08/26/22   Donita Brooks, MD  fluticasone furoate-vilanterol (BREO ELLIPTA) 200-25 MCG/ACT AEPB Inhale 1 puff into the lungs daily. 11/04/22   Charlott Holler, MD  ipratropium-albuterol (DUONEB) 0.5-2.5 (3) MG/3ML SOLN Take 3 mLs by nebulization every 6 (six) hours as needed for up to 12 doses. 02/14/23   Glyn Ade, MD  loratadine (CLARITIN) 10 MG tablet Take 1 tablet (10 mg total) by mouth daily. 08/20/21   Cobb, Ruby Cola, NP  Melatonin 10 MG TABS Take 10 mg by mouth at bedtime as needed (sleep).    [provider]  metoprolol succinate (TOPROL-XL) 25 MG 24 hr tablet TAKE ONE TABLET BY MOUTH EVERY EVENING 11/24/22   Corky Crafts, MD  pantoprazole (PROTONIX) 40 MG tablet TAKE 1 TABLET(40 MG) BY MOUTH TWICE DAILY 01/18/23   Donita Brooks, MD  predniSONE (DELTASONE) 5 MG tablet 2 daily x 1 week, then one daily x 3 weeks. 03/06/23   Jetty Duhamel D, MD  tamsulosin (FLOMAX) 0.4 MG CAPS  capsule Take 0.4 mg by mouth daily. 02/22/23   [provider]  traMADol (ULTRAM) 50 MG tablet Take 1 tablet (50 mg total) by mouth at bedtime as needed for moderate pain. 12/02/22   Donita Brooks, MD  triamcinolone cream (KENALOG) 0.1 % Apply 1 Application topically 2 (two) times daily. 01/31/23   Donita Brooks, MD    Family History    Family History  Problem Relation Age of Onset   Heart failure Mother    Esophageal cancer Father    Heart attack Maternal Uncle    Breast cancer Neg Hx    Prostate cancer Neg Hx    Pancreatic cancer Neg Hx    Colon cancer Neg Hx    He indicated that his mother is deceased. He indicated that his  father is deceased. He indicated that his maternal grandmother is deceased. He indicated that his maternal grandfather is deceased. He indicated that his paternal grandmother is deceased. He indicated that his paternal grandfather is deceased. He indicated that his maternal uncle is deceased. He indicated that the status of his neg hx is unknown.  Social History    Social History   Socioeconomic History   Marital status: Married    Spouse name: Not on file   Number of children: 2   Years of education: Not on file   Highest education level: Not on file  Occupational History    Comment: retired  Tobacco Use   Smoking status: Former    Current packs/day: 0.00    Average packs/day: 0.5 packs/day for 13.5 years (6.7 ttl pk-yrs)    Types: Cigarettes    Start date: 72    Quit date: 10/23/1968    Years since quitting: 54.6   Smokeless tobacco: Never  Vaping Use   Vaping status: Never Used  Substance and Sexual Activity   Alcohol use: No   Drug use: No   Sexual activity: Not Currently    Comment: married, retired.  Other Topics Concern   Not on file  Social History Narrative   2 children but 1 deceased   Social Drivers of Health   Financial Resource Strain: Medium Risk (04/03/2023)   Overall Financial Resource Strain (CARDIA)     Difficulty of Paying Living Expenses: Somewhat hard  Food Insecurity: No Food Insecurity (04/03/2023)   Hunger Vital Sign    Worried About Running Out of Food in the Last Year: Never true    Ran Out of Food in the Last Year: Never true  Transportation Needs: No Transportation Needs (04/03/2023)   PRAPARE - Administrator, Civil Service (Medical): No    Lack of Transportation (Non-Medical): No  Physical Activity: Insufficiently Active (04/03/2023)   Exercise Vital Sign    Days of Exercise per Week: 3 days    Minutes of Exercise per Session: 10 min  Stress: No Stress Concern Present (04/03/2023)   [employer] of Occupational Health - Occupational Stress Questionnaire    Feeling of Stress : Not at all  Social Connections: Socially Integrated (04/03/2023)   Social Connection and Isolation Panel [NHANES]    Frequency of Communication with Friends and Family: Three times a week    Frequency of Social Gatherings with Friends and Family: Once a week    Attends Religious Services: More than 4 times per year    Active Member of [employer] or Organizations: Yes    Attends Engineer, structural: More than 4 times per year    Marital Status: Married  Catering manager Violence: Not At Risk (04/03/2023)   Humiliation, Afraid, Rape, and Kick questionnaire    Fear of Current or Ex-Partner: No    Emotionally Abused: No    Physically Abused: No    Sexually Abused: No     Review of Systems    General:  No chills, fever, night sweats or weight changes.  Cardiovascular:  No chest pain, dyspnea on exertion, edema, orthopnea, palpitations, paroxysmal nocturnal dyspnea. Dermatological: No rash, lesions/masses Respiratory: No cough, dyspnea Urologic: No hematuria, dysuria Abdominal:   No nausea, vomiting, diarrhea, bright red blood per rectum, melena, or hematemesis Neurologic:  No visual changes, wkns, changes in mental status. All other systems reviewed and are otherwise negative  except as noted above.  Physical Exam    VS:  BP 120/66   Pulse 67   Ht 5\' 10"  (1.778 m)   Wt 208 lb (94.3 kg)   SpO2 97%   BMI 29.84 kg/m  , BMI Body mass index is 29.84 kg/m. GEN: Well nourished, well developed, in no acute distress. HEENT: normal. Neck: Supple, no JVD, carotid bruits, or masses. Cardiac: RRR, no murmurs, rubs, or gallops. No clubbing, cyanosis, edema.  Radials/DP/PT 2+ and equal bilaterally.  Respiratory:  Respirations regular and unlabored, clear to auscultation bilaterally. GI: Soft, nontender, nondistended, BS + x 4. MS: no deformity or atrophy. Skin: warm and dry, no rash. Neuro:  Strength and sensation are intact. Psych: Normal affect.  Accessory Clinical Findings    Recent Labs: 10/20/2022: ALT 16; ALT 16 11/08/2022: TSH 2.29 02/14/2023: B Natriuretic Peptide 89.4; BUN 15; Creatinine, Ser 0.99; Hemoglobin 12.9; Platelets 199; Potassium 4.0; Sodium 138   Recent Lipid Panel    Component Value Date/Time   CHOL 149 10/20/2022 1457   TRIG 183 (H) 10/20/2022 1457   HDL 54 10/20/2022 1457   CHOLHDL 2.8 10/20/2022 1457   VLDL 31 (H) 03/13/2015 0816   LDLCALC 69 10/20/2022 1457         ECG personally reviewed by me today-  EKG Interpretation Date/Time:  Wednesday May 31 2023 15:08:46 EST Ventricular Rate:  67 PR Interval:  164 QRS Duration:  148 QT Interval:  440 QTC Calculation: 464 R Axis:   -24  Text Interpretation: Normal sinus rhythm Right bundle branch block When compared with ECG of 14-Feb-2023 13:06, Premature ventricular complexes are no longer Present Confirmed by Edd Fabian [phone]) on 05/31/2023 3:25:26 PM   Echocardiogram 11/07/2022   IMPRESSIONS     1. Left ventricular ejection fraction, by estimation, is 60 to 65%. The  left ventricle has normal function. The left ventricle has no regional  wall motion abnormalities. Left ventricular diastolic parameters are  consistent with Grade I diastolic  dysfunction (impaired  relaxation). The average left ventricular global  longitudinal strain is -16.2 %. The global longitudinal strain is normal.   2. Right ventricular systolic function is normal. The right ventricular  size is normal. There is moderately elevated pulmonary artery systolic  pressure.   3. Mild bi leaflet prolapse . The mitral valve is abnormal. Mild to  moderate mitral valve regurgitation. No evidence of mitral stenosis.   4. The aortic valve is tricuspid. There is mild calcification of the  aortic valve. Aortic valve regurgitation is mild. Aortic valve sclerosis  is present, with no evidence of aortic valve stenosis.   5. Aortic dilatation noted. There is mild dilatation of the ascending  aorta, measuring 38 mm.   6. The inferior vena cava is normal in size with greater than 50%  respiratory variability, suggesting right atrial pressure of 3 mmHg.   FINDINGS   Left Ventricle: Left ventricular ejection fraction, by estimation, is 60  to 65%. The left ventricle has normal function. The left ventricle has no  regional wall motion abnormalities. The average left ventricular global  longitudinal strain is -16.2 %.  The global longitudinal strain is normal. The left ventricular internal  cavity size was normal in size. There is no left ventricular hypertrophy.  Left ventricular diastolic parameters are consistent with Grade I  diastolic dysfunction (impaired  relaxation).   Right Ventricle: The right ventricular size is normal. No increase in  right ventricular wall thickness. Right ventricular systolic function is  normal. There is moderately elevated pulmonary artery systolic pressure.  The  tricuspid regurgitant velocity is  3.30 m/s, and with an assumed right atrial pressure of 3 mmHg, the  estimated right ventricular systolic pressure is 46.6 mmHg.   Left Atrium: Left atrial size was normal in size.   Right Atrium: Right atrial size was normal in size.   Pericardium: There is no  evidence of pericardial effusion.   Mitral Valve: Mild bi leaflet prolapse. The mitral valve is abnormal.  There is mild thickening of the mitral valve leaflet(s). There is mild  calcification of the mitral valve leaflet(s). Mild to moderate mitral  valve regurgitation. No evidence of mitral  valve stenosis.   Tricuspid Valve: The tricuspid valve is normal in structure. Tricuspid  valve regurgitation is mild . No evidence of tricuspid stenosis.   Aortic Valve: The aortic valve is tricuspid. There is mild calcification  of the aortic valve. Aortic valve regurgitation is mild. Aortic  regurgitation PHT measures 340 msec. Aortic valve sclerosis is present,  with no evidence of aortic valve stenosis.   Pulmonic Valve: The pulmonic valve was normal in structure. Pulmonic valve  regurgitation is not visualized. No evidence of pulmonic stenosis.   Aorta: Aortic dilatation noted. There is mild dilatation of the ascending  aorta, measuring 38 mm.   Venous: The inferior vena cava is normal in size with greater than 50%  respiratory variability, suggesting right atrial pressure of 3 mmHg.   IAS/Shunts: No atrial level shunt detected by color flow Doppler.       Assessment & Plan   1.  DOE, decreased endurance-chronic.  Feels that his breathing may be getting worse with physical activity.  Previously felt that his increased work of breathing was related to deconditioning.  Continues to be limited in his physical activity.  Followed up with pulmonology who recommended consult with cardiology for further evaluation.  Pulmonology did not feel that his lung issues were in line with his symptoms. Increase physical activity as tolerated-limited in his physical activity due to breathing and leg pain. Heart healthy low-sodium diet Offered coronary CTA versus nuclear stress testing-wishes to contact insurance to evaluate cost of the studies.  Will be contacting office to discuss scheduling  after  Paroxysmal atrial fibrillation-heart rate today 67.  Denies episodes of irregular or accelerated heart rate.  Compliant with apixaban.  Denies bleeding issues. Continue metoprolol, apixaban  Essential hypertension-BP today 120/66. Maintain blood pressure log Continue current medical therapy Heart healthy low-sodium diet  Hyperlipidemia-LDL 69 on 10/20/22. High-fiber diet Follows with PCP  Disposition: Follow-up with primary cardiologist or me after further prognostication   Thomasene Ripple. Aston Lawhorn NP-C     05/31/2023, 3:51 PM [facility] [address] Office [phone] Fax [phone]    I spent 15 minutes examining this patient, reviewing medications, and using patient centered shared decision making involving their cardiac care.   I spent greater than 20 minutes reviewing their past medical history,  medications, and prior cardiac tests.

## 2023-05-30 DIAGNOSIS — H353221 Exudative age-related macular degeneration, left eye, with active choroidal neovascularization: Secondary | ICD-10-CM | POA: Diagnosis not present

## 2023-05-31 ENCOUNTER — Encounter: Payer: Self-pay | Admitting: General Practice

## 2023-05-31 ENCOUNTER — Ambulatory Visit: Payer: PPO | Attending: General Practice | Admitting: General Practice

## 2023-05-31 VITALS — BP 120/66 | HR 67 | Ht 70.0 in | Wt 208.0 lb

## 2023-05-31 DIAGNOSIS — I10 Essential (primary) hypertension: Secondary | ICD-10-CM | POA: Diagnosis not present

## 2023-05-31 DIAGNOSIS — I480 Paroxysmal atrial fibrillation: Secondary | ICD-10-CM | POA: Diagnosis not present

## 2023-05-31 DIAGNOSIS — E782 Mixed hyperlipidemia: Secondary | ICD-10-CM

## 2023-05-31 DIAGNOSIS — R0602 Shortness of breath: Secondary | ICD-10-CM

## 2023-05-31 NOTE — Patient Instructions (Signed)
Medication Instructions:  The current medical regimen is effective;  continue present plan and medications as directed. Please refer to the Current Medication list given to you today.  *If you need a refill on your cardiac medications before your next appointment, please call your pharmacy*  Lab Work: NONE  Testing/Procedures: CORONARY CTA -OR- MYOVIEW LEXISCAN-PLEASE CALL AND LET us KNOW WHICH YOU WANT. CONTINUE PHYSICAL ACTIVITY LEVEL-INCREASE AS TOLERATED  Follow-Up: At [facility], you and your health needs are our priority.  As part of our continuing mission to provide you with exceptional heart care, we have created designated Provider Care Teams.  These Care Teams include your primary Cardiologist (physician) and Advanced Practice Providers (APPs -  Physician Assistants and Nurse Practitioners) who all work together to provide you with the care you need, when you need it.  We recommend signing up for the patient portal called "MyChart".  Sign up information is provided on this After Visit Summary.  MyChart is used to connect with patients for Virtual Visits (Telemedicine).  Patients are able to view lab/test results, encounter notes, upcoming appointments, etc.  Non-urgent messages can be sent to your provider as well.   To learn more about what you can do with MyChart, go to [email or web address].    Your next appointment:   AFTER TESTING   Provider:   Edd Fabian, FNP-C

## 2023-06-02 ENCOUNTER — Other Ambulatory Visit: Payer: Self-pay | Admitting: Family Medicine

## 2023-06-02 DIAGNOSIS — E782 Mixed hyperlipidemia: Secondary | ICD-10-CM

## 2023-06-05 NOTE — Telephone Encounter (Signed)
Requested by interface surescripts. Last refill documented 06/02/23. Too soon for refills Requested Prescriptions  Pending Prescriptions Disp Refills   metoprolol succinate (TOPROL-XL) 25 MG 24 hr tablet [Pharmacy Med Name: METOPROLOL SUC ER 25MG  TAB 25 Tablet] 90 tablet 10    Sig: TAKE 1 TABLET BY MOUTH EACH EVENING     Cardiovascular:  Beta Blockers Failed - 06/05/2023 10:28 AM      Failed - Valid encounter within last 6 months    Recent Outpatient Visits           1 year ago Constipation, unspecified constipation type   [facility] Pickard, Priscille Heidelberg, MD   1 year ago Strain of lumbar region, initial encounter   [facility] Pickard, Priscille Heidelberg, MD   2 years ago Encounter for Medicare annual wellness exam   [facility] Tanya Nones, Priscille Heidelberg, MD   2 years ago Viral URI   [facility] Tanya Nones, Priscille Heidelberg, MD   2 years ago Insect bite of left knee, initial encounter   [facility] Cathlean Marseilles A, NP              Passed - Last BP in normal range    BP Readings from Last 1 Encounters:  05/31/23 120/66         Passed - Last Heart Rate in normal range    Pulse Readings from Last 1 Encounters:  05/31/23 67         Refused Prescriptions Disp Refills   fluticasone (FLONASE) 50 MCG/ACT nasal spray [Pharmacy Med Name: FLUTICASONE NASAL 16 50 SUS] 48 g 10    Sig: INSTILL 2 SPRAYS INTO BOTH NOSTRILS AT BEDTIME     Ear, Nose, and Throat: Nasal Preparations - Corticosteroids Failed - 06/05/2023 10:28 AM      Failed - Valid encounter within last 12 months    Recent Outpatient Visits           1 year ago Constipation, unspecified constipation type   [facility] Donita Brooks, MD   1 year ago Strain of lumbar region, initial encounter   [facility] Medicine Pickard, Priscille Heidelberg, MD   2 years ago Encounter for Medicare annual wellness exam   [facility]  Donita Brooks, MD   2 years ago Viral URI   [facility] Tanya Nones, Priscille Heidelberg, MD   2 years ago Insect bite of left knee, initial encounter   [facility] Cathlean Marseilles A, NP               atorvastatin (LIPITOR) 40 MG tablet [Pharmacy Med Name: ATORVASTATIN 40MG  TABLET 40 Tablet] 90 tablet 10    Sig: TAKE 1 TABLET BY MOUTH EACH EVENING     Cardiovascular:  Antilipid - Statins Failed - 06/05/2023 10:28 AM      Failed - Valid encounter within last 12 months    Recent Outpatient Visits           1 year ago Constipation, unspecified constipation type   [facility] Medicine Donita Brooks, MD   1 year ago Strain of lumbar region, initial encounter   [facility] Medicine Pickard, Priscille Heidelberg, MD   2 years ago Encounter for Medicare annual wellness exam   [facility] Medicine Donita Brooks, MD   2 years ago Viral URI   [facility] Medicine Lynnea Ferrier  T, MD   2 years ago Insect bite of left knee, initial encounter   [facility] Medicine Cathlean Marseilles A, NP              Failed - Lipid Panel in normal range within the last 12 months    Cholesterol  Date Value Ref Range Status  10/20/2022 149 <200 mg/dL Final   LDL Cholesterol (Calc)  Date Value Ref Range Status  10/20/2022 69 mg/dL (calc) Final    Comment:    Reference range: <100 . Desirable range <100 mg/dL for primary prevention;   <70 mg/dL for patients with CHD or diabetic patients  with > or = 2 CHD risk factors. Marland Kitchen LDL-C is now calculated using the Martin-Hopkins  calculation, which is a validated novel method providing  better accuracy than the Friedewald equation in the  estimation of LDL-C.  Horald Pollen et al. Lenox Ahr. 1610;960(45): 2061-2068  (http://education.[employer].com/faq/FAQ164)    HDL  Date Value Ref Range Status  10/20/2022 54 > OR = 40 mg/dL Final   Triglycerides  Date Value Ref Range Status   10/20/2022 183 (H) <150 mg/dL Final         Passed - Patient is not pregnant

## 2023-06-05 NOTE — Telephone Encounter (Signed)
Requested medication (s) are due for refill today: no  Requested medication (s) are on the active medication list: yes  Last refill:  11/14/22 #90 3 refills  Future visit scheduled: no   Notes to clinic:  last ordered by Lance Muss, MD. Do you want to refused refill or refill Rx?     Requested Prescriptions  Pending Prescriptions Disp Refills   metoprolol succinate (TOPROL-XL) 25 MG 24 hr tablet [Pharmacy Med Name: METOPROLOL SUC ER 25MG  TAB 25 Tablet] 90 tablet 10    Sig: TAKE 1 TABLET BY MOUTH EACH EVENING     Cardiovascular:  Beta Blockers Failed - 06/05/2023 10:28 AM      Failed - Valid encounter within last 6 months    Recent Outpatient Visits           1 year ago Constipation, unspecified constipation type   [facility] Medicine Pickard, Priscille Heidelberg, MD   1 year ago Strain of lumbar region, initial encounter   [facility] Pickard, Priscille Heidelberg, MD   2 years ago Encounter for Medicare annual wellness exam   [facility] Donita Brooks, MD   2 years ago Viral URI   [facility] Medicine Tanya Nones, Priscille Heidelberg, MD   2 years ago Insect bite of left knee, initial encounter   [facility] Medicine Cathlean Marseilles A, NP              Passed - Last BP in normal range    BP Readings from Last 1 Encounters:  05/31/23 120/66         Passed - Last Heart Rate in normal range    Pulse Readings from Last 1 Encounters:  05/31/23 67         Refused Prescriptions Disp Refills   fluticasone (FLONASE) 50 MCG/ACT nasal spray [Pharmacy Med Name: FLUTICASONE NASAL 16 50 SUS] 48 g 10    Sig: INSTILL 2 SPRAYS INTO BOTH NOSTRILS AT BEDTIME     Ear, Nose, and Throat: Nasal Preparations - Corticosteroids Failed - 06/05/2023 10:28 AM      Failed - Valid encounter within last 12 months    Recent Outpatient Visits           1 year ago Constipation, unspecified constipation type   [facility] Medicine Donita Brooks, MD   1 year ago Strain of lumbar region, initial encounter   [facility] Medicine Pickard, Priscille Heidelberg, MD   2 years ago Encounter for Medicare annual wellness exam   [facility] Donita Brooks, MD   2 years ago Viral URI   [facility] Tanya Nones, Priscille Heidelberg, MD   2 years ago Insect bite of left knee, initial encounter   [facility] Cathlean Marseilles A, NP               atorvastatin (LIPITOR) 40 MG tablet [Pharmacy Med Name: ATORVASTATIN 40MG  TABLET 40 Tablet] 90 tablet 10    Sig: TAKE 1 TABLET BY MOUTH EACH EVENING     Cardiovascular:  Antilipid - Statins Failed - 06/05/2023 10:28 AM      Failed - Valid encounter within last 12 months    Recent Outpatient Visits           1 year ago Constipation, unspecified constipation type   [facility] Pickard, Priscille Heidelberg, MD   1 year ago Strain of lumbar region, initial encounter  [facility] Medicine Pickard, Priscille Heidelberg, MD   2 years ago Encounter for Medicare annual wellness exam   [facility] Family Medicine Tanya Nones, Priscille Heidelberg, MD   2 years ago Viral URI   [facility] Family Medicine Tanya Nones, Priscille Heidelberg, MD   2 years ago Insect bite of left knee, initial encounter   [facility] Cathlean Marseilles A, NP              Failed - Lipid Panel in normal range within the last 12 months    Cholesterol  Date Value Ref Range Status  10/20/2022 149 <200 mg/dL Final   LDL Cholesterol (Calc)  Date Value Ref Range Status  10/20/2022 69 mg/dL (calc) Final    Comment:    Reference range: <100 . Desirable range <100 mg/dL for primary prevention;   <70 mg/dL for patients with CHD or diabetic patients  with > or = 2 CHD risk factors. Marland Kitchen LDL-C is now calculated using the Martin-Hopkins  calculation, which is a validated novel method providing  better accuracy than the Friedewald equation in the  estimation of LDL-C.  Horald Pollen et al. Lenox Ahr.  7829;562(13): 2061-2068  (http://education.[facility].com/faq/FAQ164)    HDL  Date Value Ref Range Status  10/20/2022 54 > OR = 40 mg/dL Final   Triglycerides  Date Value Ref Range Status  10/20/2022 183 (H) <150 mg/dL Final         Passed - Patient is not pregnant

## 2023-06-08 DIAGNOSIS — D1721 Benign lipomatous neoplasm of skin and subcutaneous tissue of right arm: Secondary | ICD-10-CM | POA: Diagnosis not present

## 2023-06-09 DIAGNOSIS — Z4789 Encounter for other orthopedic aftercare: Secondary | ICD-10-CM | POA: Insufficient documentation

## 2023-06-15 DIAGNOSIS — Z4789 Encounter for other orthopedic aftercare: Secondary | ICD-10-CM | POA: Diagnosis not present

## 2023-06-21 DIAGNOSIS — C4442 Squamous cell carcinoma of skin of scalp and neck: Secondary | ICD-10-CM | POA: Diagnosis not present

## 2023-06-27 ENCOUNTER — Other Ambulatory Visit: Payer: Self-pay | Admitting: Family Medicine

## 2023-06-27 DIAGNOSIS — E782 Mixed hyperlipidemia: Secondary | ICD-10-CM

## 2023-06-27 DIAGNOSIS — K219 Gastro-esophageal reflux disease without esophagitis: Secondary | ICD-10-CM

## 2023-06-27 MED ORDER — METOPROLOL SUCCINATE ER 25 MG PO TB24: 25.0000 mg | ORAL_TABLET | Freq: Every evening | ORAL | 3 refills | Status: AC

## 2023-06-27 MED ORDER — PANTOPRAZOLE SODIUM 40 MG PO TBEC: DELAYED_RELEASE_TABLET | ORAL | 0 refills | Status: DC

## 2023-06-27 MED ORDER — ATORVASTATIN CALCIUM 40 MG PO TABS: ORAL_TABLET | ORAL | 3 refills | Status: DC

## 2023-06-27 MED ORDER — APIXABAN 5 MG PO TABS: 5.0000 mg | ORAL_TABLET | Freq: Two times a day (BID) | ORAL | 3 refills | Status: DC

## 2023-06-27 NOTE — Telephone Encounter (Signed)
 Copied from CRM [id number]. Topic: Clinical - Medication Refill >> Jun 27, 2023 10:15 AM Dimitri Ped wrote: Most Recent Primary Care Visit:  Provider: Kurtis Bushman S  [facility]  Visit Type: ACUTE  Date: 04/19/2023  Medication: atorvastatin (LIPITOR) 40 MG tablet metoprolol succinate (TOPROL-XL) 25 MG 24 hr tablet apixaban (ELIQUIS) 5 MG TABS tablet pantoprazole (PROTONIX) 40 MG tablet   Has the patient contacted their pharmacy? No because patient is switching pharmacy  (Agent: If no, request that the patient contact the pharmacy for the refill. If patient does not wish to contact the pharmacy document the reason why and proceed with request.) (Agent: If yes, when and what did the pharmacy advise?)  Is this the correct pharmacy for this prescription? Yes If no, delete pharmacy and type the correct one.  This is the patient's preferred pharmacy:  [id number] [place], [place] - [address] [place] [address] Phone: [phone] Fax: [phone]  [facility] [address] [place] [address] Phone: [phone] Fax: [phone]  [facility] [place] [facility] [place] [place] - [address] [place] [address] Phone: [phone] Fax: [phone]   Has the prescription been filled recently? No  Is the patient out of the medication? Yes  Has the patient been seen for an appointment in the last year OR does the patient have an upcoming appointment? Yes  Can we respond through MyChart? No  Agent: Please be advised that Rx refills may take up to 3 business days. We ask that you follow-up with your pharmacy.

## 2023-07-10 DIAGNOSIS — H353211 Exudative age-related macular degeneration, right eye, with active choroidal neovascularization: Secondary | ICD-10-CM | POA: Diagnosis not present

## 2023-07-13 DIAGNOSIS — L820 Inflamed seborrheic keratosis: Secondary | ICD-10-CM | POA: Diagnosis not present

## 2023-07-13 DIAGNOSIS — L538 Other specified erythematous conditions: Secondary | ICD-10-CM | POA: Diagnosis not present

## 2023-07-24 DIAGNOSIS — H35363 Drusen (degenerative) of macula, bilateral: Secondary | ICD-10-CM | POA: Diagnosis not present

## 2023-07-24 DIAGNOSIS — H35723 Serous detachment of retinal pigment epithelium, bilateral: Secondary | ICD-10-CM | POA: Diagnosis not present

## 2023-07-24 DIAGNOSIS — H353231 Exudative age-related macular degeneration, bilateral, with active choroidal neovascularization: Secondary | ICD-10-CM | POA: Diagnosis not present

## 2023-08-01 DIAGNOSIS — H353221 Exudative age-related macular degeneration, left eye, with active choroidal neovascularization: Secondary | ICD-10-CM | POA: Diagnosis not present

## 2023-08-02 DIAGNOSIS — C61 Malignant neoplasm of prostate: Secondary | ICD-10-CM | POA: Diagnosis not present

## 2023-08-07 ENCOUNTER — Other Ambulatory Visit: Payer: Self-pay | Admitting: Family Medicine

## 2023-08-07 DIAGNOSIS — K219 Gastro-esophageal reflux disease without esophagitis: Secondary | ICD-10-CM

## 2023-08-07 NOTE — Telephone Encounter (Signed)
 Copied from CRM [id number]. Topic: Clinical - Medication Refill >> Aug 07, 2023  2:25 PM Luther Parody E wrote: Patient calling in medication refill   Most Recent Primary Care Visit:  Provider: Kurtis Bushman S  [facility]  Visit Type: ACUTE  Date: 04/19/2023  Medication:  pantoprazole (PROTONIX) 40 MG tablet  patient has 5 left and would like a 90 day supply.    Patient would like a Rx written for 90 day supply of these medications    Has the patient contacted their pharmacy? Yes (Agent: If no, request that the patient contact the pharmacy for the refill. If patient does not wish to contact the pharmacy document the reason why and proceed with request.) (Agent: If yes, when and what did the pharmacy advise?)  Is this the correct pharmacy for this prescription? Yes If no, delete pharmacy and type the correct one.  This is the patient's preferred pharmacy:   [id number] [place], [place] - [address] [place] [address] Phone: [phone] Fax: [phone]    Has the prescription been filled recently? Yes  Is the patient out of the medication? No patient has 6 days left   Has the patient been seen for an appointment in the last year OR does the patient have an upcoming appointment? Yes  Can we respond through MyChart? No  Agent: Please be advised that Rx refills may take up to 3 business days. We ask that you follow-up with your pharmacy.   Patient phone  # [phone] ok to leave detailed message.

## 2023-08-09 DIAGNOSIS — R351 Nocturia: Secondary | ICD-10-CM | POA: Diagnosis not present

## 2023-08-09 DIAGNOSIS — C61 Malignant neoplasm of prostate: Secondary | ICD-10-CM | POA: Diagnosis not present

## 2023-08-09 DIAGNOSIS — N401 Enlarged prostate with lower urinary tract symptoms: Secondary | ICD-10-CM | POA: Diagnosis not present

## 2023-08-09 NOTE — Telephone Encounter (Signed)
 Requested Prescriptions  Pending Prescriptions Disp Refills   pantoprazole (PROTONIX) 40 MG tablet [Pharmacy Med Name: PANTOPRAZOLE SOD DR 40 MG TAB] 60 tablet 0    Sig: TAKE 1 TABLET(40 MG) BY MOUTH TWICE DAILY     Gastroenterology: Proton Pump Inhibitors Passed - 08/09/2023 11:42 AM      Passed - Valid encounter within last 12 months    Recent Outpatient Visits           3 months ago Acute nonintractable headache, unspecified headache type   [place] [facility] Park Meo, FNP   6 months ago Palpitations   [place] [facility] Donita Brooks, MD   7 months ago Acute cystitis with hematuria   [place] [facility] Donita Brooks, MD   9 months ago Fatigue, unspecified type   [place] [facility] Donita Brooks, MD   1 year ago Acute viral syndrome   [facility] Pickard, Priscille Heidelberg, MD

## 2023-08-14 DIAGNOSIS — H353211 Exudative age-related macular degeneration, right eye, with active choroidal neovascularization: Secondary | ICD-10-CM | POA: Diagnosis not present

## 2023-09-04 DIAGNOSIS — H353221 Exudative age-related macular degeneration, left eye, with active choroidal neovascularization: Secondary | ICD-10-CM | POA: Diagnosis not present

## 2023-09-07 DIAGNOSIS — H353231 Exudative age-related macular degeneration, bilateral, with active choroidal neovascularization: Secondary | ICD-10-CM | POA: Diagnosis not present

## 2023-09-07 DIAGNOSIS — H52203 Unspecified astigmatism, bilateral: Secondary | ICD-10-CM | POA: Diagnosis not present

## 2023-09-07 DIAGNOSIS — H524 Presbyopia: Secondary | ICD-10-CM | POA: Diagnosis not present

## 2023-09-07 DIAGNOSIS — H04123 Dry eye syndrome of bilateral lacrimal glands: Secondary | ICD-10-CM | POA: Diagnosis not present

## 2023-09-07 DIAGNOSIS — H26491 Other secondary cataract, right eye: Secondary | ICD-10-CM | POA: Diagnosis not present

## 2023-09-19 ENCOUNTER — Telehealth: Payer: Self-pay

## 2023-09-19 ENCOUNTER — Other Ambulatory Visit: Payer: Self-pay

## 2023-09-19 DIAGNOSIS — R0609 Other forms of dyspnea: Secondary | ICD-10-CM

## 2023-09-19 DIAGNOSIS — J4520 Mild intermittent asthma, uncomplicated: Secondary | ICD-10-CM

## 2023-09-19 DIAGNOSIS — J4530 Mild persistent asthma, uncomplicated: Secondary | ICD-10-CM

## 2023-09-19 DIAGNOSIS — R053 Chronic cough: Secondary | ICD-10-CM

## 2023-09-19 DIAGNOSIS — I480 Paroxysmal atrial fibrillation: Secondary | ICD-10-CM

## 2023-09-19 DIAGNOSIS — R002 Palpitations: Secondary | ICD-10-CM

## 2023-09-19 DIAGNOSIS — E782 Mixed hyperlipidemia: Secondary | ICD-10-CM

## 2023-09-19 MED ORDER — ATORVASTATIN CALCIUM 40 MG PO TABS: ORAL_TABLET | ORAL | 3 refills | Status: AC

## 2023-09-19 MED ORDER — APIXABAN 5 MG PO TABS: 5.0000 mg | ORAL_TABLET | Freq: Two times a day (BID) | ORAL | 3 refills | Status: DC

## 2023-09-19 MED ORDER — FLUTICASONE FUROATE-VILANTEROL 200-25 MCG/ACT IN AEPB: 1.0000 | INHALATION_SPRAY | Freq: Every day | RESPIRATORY_TRACT | 5 refills | Status: DC

## 2023-09-19 NOTE — Telephone Encounter (Signed)
 Pt came into office stating that he has changed from a mail order pharmacy to the pharmacy listed below, and requests refills for a  90 day supply of these meds to be sent to the new pharmacy please.   atorvastatin  (LIPITOR) 40 MG tablet [[id number]] fluticasone  furoate-vilanterol (BREO ELLIPTA ) 200-25 MCG/ACT AEPB [[id number]] apixaban  (ELIQUIS ) 5 MG TABS tablet [[id number]]  LOV: 04/19/23  [id number] [place], [place] - [address] [place] [address] Phone: [phone]  Fax: [phone]-     CB#: [phone]

## 2023-09-22 ENCOUNTER — Other Ambulatory Visit: Payer: Self-pay

## 2023-09-22 DIAGNOSIS — J4520 Mild intermittent asthma, uncomplicated: Secondary | ICD-10-CM

## 2023-09-22 DIAGNOSIS — R053 Chronic cough: Secondary | ICD-10-CM

## 2023-09-22 DIAGNOSIS — J4530 Mild persistent asthma, uncomplicated: Secondary | ICD-10-CM

## 2023-09-22 DIAGNOSIS — R0609 Other forms of dyspnea: Secondary | ICD-10-CM

## 2023-09-22 NOTE — Addendum Note (Signed)
 Addended by: Amad Mau K on: 09/22/2023 12:05 PM   Modules accepted: Orders

## 2023-09-22 NOTE — Telephone Encounter (Signed)
 Prescription Request  09/22/2023  LOV: Visit date not found  What is the name of the medication or equipment? fluticasone  (FLONASE ) 50 MCG/ACT nasal spray   Have you contacted your pharmacy to request a refill? Yes   Which pharmacy would you like this sent to?    [facility] [address] [place], [place]  [address]    Patient notified that their request is being sent to the clinical staff for review and that they should receive a response within 2 business days.   Please advise at Mobile [phone] (mobile)

## 2023-09-25 ENCOUNTER — Other Ambulatory Visit: Payer: Self-pay

## 2023-09-25 DIAGNOSIS — H353211 Exudative age-related macular degeneration, right eye, with active choroidal neovascularization: Secondary | ICD-10-CM | POA: Diagnosis not present

## 2023-09-25 NOTE — Telephone Encounter (Signed)
 Prescription Request  09/25/2023  LOV: Visit date not found  What is the name of the medication or equipment? fluticasone  (FLONASE ) 50 MCG/ACT nasal spray   Have you contacted your pharmacy to request a refill? Yes   Which pharmacy would you like this sent to?   [facility]  [address]  [place] [address]    Patient notified that their request is being sent to the clinical staff for review and that they should receive a response within 2 business days.   Please advise at Mobile [phone] (mobile)

## 2023-09-27 MED ORDER — FLUTICASONE PROPIONATE 50 MCG/ACT NA SUSP: NASAL | 0 refills | Status: DC

## 2023-09-27 NOTE — Telephone Encounter (Signed)
 Requested Prescriptions  Pending Prescriptions Disp Refills   fluticasone  (FLONASE ) 50 MCG/ACT nasal spray 16 g 0    Sig: instill two SPRAYS in each nostril daily AT night     Ear, Nose, and Throat: Nasal Preparations - Corticosteroids Passed - 09/27/2023  1:18 PM      Passed - Valid encounter within last 12 months    Recent Outpatient Visits           5 months ago Acute nonintractable headache, unspecified headache type   [place] [facility] Jenelle Mis, FNP   7 months ago Palpitations   [place] [facility] Austine Lefort, MD   8 months ago Acute cystitis with hematuria   [place] [facility] Austine Lefort, MD   10 months ago Fatigue, unspecified type   [place] Austine Lefort, MD   1 year ago Acute viral syndrome   [place] [facility] Medicine Pickard, Cisco Crest, MD

## 2023-09-28 DIAGNOSIS — Z9089 Acquired absence of other organs: Secondary | ICD-10-CM | POA: Diagnosis not present

## 2023-09-28 DIAGNOSIS — H6122 Impacted cerumen, left ear: Secondary | ICD-10-CM | POA: Diagnosis not present

## 2023-10-10 DIAGNOSIS — L570 Actinic keratosis: Secondary | ICD-10-CM | POA: Diagnosis not present

## 2023-10-10 DIAGNOSIS — H353221 Exudative age-related macular degeneration, left eye, with active choroidal neovascularization: Secondary | ICD-10-CM | POA: Diagnosis not present

## 2023-10-10 DIAGNOSIS — L821 Other seborrheic keratosis: Secondary | ICD-10-CM | POA: Diagnosis not present

## 2023-10-10 DIAGNOSIS — L814 Other melanin hyperpigmentation: Secondary | ICD-10-CM | POA: Diagnosis not present

## 2023-10-10 DIAGNOSIS — D225 Melanocytic nevi of trunk: Secondary | ICD-10-CM | POA: Diagnosis not present

## 2023-10-10 DIAGNOSIS — Z09 Encounter for follow-up examination after completed treatment for conditions other than malignant neoplasm: Secondary | ICD-10-CM | POA: Diagnosis not present

## 2023-10-13 DIAGNOSIS — M5416 Radiculopathy, lumbar region: Secondary | ICD-10-CM | POA: Diagnosis not present

## 2023-10-16 ENCOUNTER — Other Ambulatory Visit: Payer: Self-pay | Admitting: Neurological Surgery

## 2023-10-16 DIAGNOSIS — M5416 Radiculopathy, lumbar region: Secondary | ICD-10-CM

## 2023-10-28 ENCOUNTER — Ambulatory Visit
Admission: RE | Admit: 2023-10-28 | Discharge: 2023-10-28 | Disposition: A | Discharge status: Home / Self Care | Source: Ambulatory Visit | Attending: Neurological Surgery | Admitting: Neurological Surgery

## 2023-10-28 DIAGNOSIS — M5416 Radiculopathy, lumbar region: Secondary | ICD-10-CM

## 2023-10-28 DIAGNOSIS — M48061 Spinal stenosis, lumbar region without neurogenic claudication: Secondary | ICD-10-CM | POA: Diagnosis not present

## 2023-10-28 DIAGNOSIS — M47816 Spondylosis without myelopathy or radiculopathy, lumbar region: Secondary | ICD-10-CM | POA: Diagnosis not present

## 2023-10-29 ENCOUNTER — Other Ambulatory Visit: Payer: Self-pay | Admitting: Family Medicine

## 2023-11-06 ENCOUNTER — Other Ambulatory Visit: Payer: Self-pay | Admitting: Family Medicine

## 2023-11-06 DIAGNOSIS — H353211 Exudative age-related macular degeneration, right eye, with active choroidal neovascularization: Secondary | ICD-10-CM | POA: Diagnosis not present

## 2023-11-06 DIAGNOSIS — K219 Gastro-esophageal reflux disease without esophagitis: Secondary | ICD-10-CM

## 2023-11-14 DIAGNOSIS — M5416 Radiculopathy, lumbar region: Secondary | ICD-10-CM | POA: Diagnosis not present

## 2023-11-14 DIAGNOSIS — H353221 Exudative age-related macular degeneration, left eye, with active choroidal neovascularization: Secondary | ICD-10-CM | POA: Diagnosis not present

## 2023-11-21 DIAGNOSIS — M5416 Radiculopathy, lumbar region: Secondary | ICD-10-CM | POA: Diagnosis not present

## 2023-11-27 DIAGNOSIS — M5416 Radiculopathy, lumbar region: Secondary | ICD-10-CM | POA: Diagnosis not present

## 2023-11-30 DIAGNOSIS — M5416 Radiculopathy, lumbar region: Secondary | ICD-10-CM | POA: Diagnosis not present

## 2023-12-20 DIAGNOSIS — H353221 Exudative age-related macular degeneration, left eye, with active choroidal neovascularization: Secondary | ICD-10-CM | POA: Diagnosis not present

## 2023-12-26 DIAGNOSIS — H353211 Exudative age-related macular degeneration, right eye, with active choroidal neovascularization: Secondary | ICD-10-CM | POA: Diagnosis not present

## 2024-01-01 ENCOUNTER — Telehealth: Payer: Self-pay | Admitting: Family Medicine

## 2024-01-01 ENCOUNTER — Other Ambulatory Visit: Payer: Self-pay

## 2024-01-01 DIAGNOSIS — K219 Gastro-esophageal reflux disease without esophagitis: Secondary | ICD-10-CM

## 2024-01-01 MED ORDER — PANTOPRAZOLE SODIUM 40 MG PO TBEC: DELAYED_RELEASE_TABLET | ORAL | 0 refills | Status: DC

## 2024-01-01 NOTE — Telephone Encounter (Signed)
 Prescription Request  01/01/2024  LOV: 01/31/2023  What is the name of the medication or equipment?   pantoprazole  (PROTONIX ) 40 MG tablet  **90 day script request**  Have you contacted your pharmacy to request a refill? Yes   Which pharmacy would you like this sent to?  [id number] [place], [place] - [address] [place] [address] Phone: [phone] Fax: [phone]    Patient notified that their request is being sent to the clinical staff for review and that they should receive a response within 2 business days.   Please advise pharmacist.

## 2024-01-02 ENCOUNTER — Encounter: Payer: Self-pay | Admitting: Family Medicine

## 2024-01-02 ENCOUNTER — Ambulatory Visit (INDEPENDENT_AMBULATORY_CARE_PROVIDER_SITE_OTHER): Admitting: Family Medicine

## 2024-01-02 VITALS — BP 136/76 | HR 57 | Temp 97.6°F | Ht 70.0 in | Wt 201.6 lb

## 2024-01-02 DIAGNOSIS — L299 Pruritus, unspecified: Secondary | ICD-10-CM | POA: Diagnosis not present

## 2024-01-02 DIAGNOSIS — Z23 Encounter for immunization: Secondary | ICD-10-CM

## 2024-01-02 MED ORDER — HYDROXYZINE HCL 25 MG PO TABS: 25.0000 mg | ORAL_TABLET | Freq: Three times a day (TID) | ORAL | 0 refills | Status: AC | PRN

## 2024-01-02 NOTE — Progress Notes (Signed)
 Subjective:    Patient ID: Tyler Allison, male    DOB: May 19, 1940, 83 y.o.   MRN: [id number]   Subjective:    Patient states that recently when he goes outside and gets hot, he develops itching on the skin over his forearms bilaterally and over his lower legs below his knees.  Only the exposed areas of his skin mentioned above start to itch.  The itching becomes severe.  It only occurs when he goes outside.  He states that when he comes inside, he will apply Benadryl cream and after 2 hours the rash will subside.  Itching will subside.  However this prevents him from working in his yard and is quite miserable Past Medical History:  Diagnosis Date   Allergy    Anxiety    Asthma    Cancer (HCC)    prostate cancer   Cervical spondylosis with radiculopathy    left c6 radiculopathy   Chronic back pain    GERD (gastroesophageal reflux disease)    Hyperlipidemia    Hypertension    PAF (paroxysmal atrial fibrillation) (HCC)    Prostate cancer (HCC)    Past Surgical History:  Procedure Laterality Date   BACK SURGERY     MICROLARYNGOSCOPY W/VOCAL CORD INJECTION N/A 10/30/2020   Procedure: MICRODIRECT LARYNGOSCOPY WITH VOCAL CORD INJECTION AND CO2 LASER APPLICATION;  Surgeon: Carlie Clark, MD;  Location: [facility] OR;  Service: ENT;  Laterality: N/A;   PROSTATE BIOPSY     SPINE SURGERY     back surgery x 3 including lumbar fusion, neck surgery x 2 including C4-6 ACDF   tympanoplasty with mastoidectomy Left 01/13/2004   Current Outpatient Medications on File Prior to Visit  Medication Sig Dispense Refill   acetaminophen  (TYLENOL ) 500 MG tablet Take 500 mg by mouth every 6 (six) hours as needed for moderate pain.     apixaban  (ELIQUIS ) 5 MG TABS tablet Take 1 tablet (5 mg total) by mouth 2 (two) times daily. 180 tablet 3   atorvastatin  (LIPITOR) 40 MG tablet TAKE 1 TABLET(40 MG) BY MOUTH DAILY 90 tablet 3   clonazePAM  (KLONOPIN ) 0.5 MG tablet TAKE 1 TABLET(0.5 MG) BY MOUTH TWICE DAILY AS  NEEDED FOR ANXIETY 60 tablet 1   cyclobenzaprine  (FLEXERIL ) 10 MG tablet Take 1 tablet (10 mg total) by mouth 3 (three) times daily as needed for muscle spasms. 30 tablet 0   fluticasone  (FLONASE ) 50 MCG/ACT nasal spray INSTILL TWO SPRAYS IN EACH NOSTRIL DAILY AT NIGHT 16 mL 1   fluticasone  furoate-vilanterol (BREO ELLIPTA ) 200-25 MCG/ACT AEPB Inhale 1 puff into the lungs daily. 1 each 5   ipratropium-albuterol  (DUONEB) 0.5-2.5 (3) MG/3ML SOLN Take 3 mLs by nebulization every 6 (six) hours as needed for up to 12 doses. 36 mL 0   loratadine  (CLARITIN ) 10 MG tablet Take 1 tablet (10 mg total) by mouth daily. 30 tablet 5   Melatonin 10 MG TABS Take 10 mg by mouth at bedtime as needed (sleep).     metoprolol  succinate (TOPROL -XL) 25 MG 24 hr tablet Take 1 tablet (25 mg total) by mouth every evening. 90 tablet 3   pantoprazole  (PROTONIX ) 40 MG tablet TAKE 1 TABLET(40 MG) BY MOUTH TWICE DAILY. APPOINTMENT NEEDED IN OFFICE BEFORE FURTHER REFILLS. 60 tablet 0   tamsulosin  (FLOMAX ) 0.4 MG CAPS capsule Take 0.4 mg by mouth daily.     traMADol  (ULTRAM ) 50 MG tablet Take 1 tablet (50 mg total) by mouth at bedtime as needed for moderate pain.  30 tablet 0   triamcinolone  cream (KENALOG ) 0.1 % Apply 1 Application topically 2 (two) times daily. 30 g 0   albuterol  (PROAIR  HFA) 108 (90 Base) MCG/ACT inhaler Inhale 2 puffs into the lungs every 4 (four) hours as needed. (Patient not taking: Reported on 01/02/2024) 8.5 g 11   predniSONE  (DELTASONE ) 5 MG tablet 2 daily x 1 week, then one daily x 3 weeks. (Patient not taking: Reported on 01/02/2024) 50 tablet 0   No current facility-administered medications on file prior to visit.   Allergies  Allergen Reactions   Codeine Nausea And Vomiting   Social History   Socioeconomic History   Marital status: Married    Spouse name: Not on file   Number of children: 2   Years of education: Not on file   Highest education level: Not on file  Occupational History     Comment: retired  Tobacco Use   Smoking status: Former    Current packs/day: 0.00    Average packs/day: 0.5 packs/day for 13.5 years (6.7 ttl pk-yrs)    Types: Cigarettes    Start date: 6    Quit date: 10/23/1968    Years since quitting: 55.2   Smokeless tobacco: Never  Vaping Use   Vaping status: Never Used  Substance and Sexual Activity   Alcohol use: No   Drug use: No   Sexual activity: Not Currently    Comment: married, retired.  Other Topics Concern   Not on file  Social History Narrative   2 children but 1 deceased   Social Drivers of Health   Financial Resource Strain: Medium Risk (04/03/2023)   Overall Financial Resource Strain (CARDIA)    Difficulty of Paying Living Expenses: Somewhat hard  Food Insecurity: No Food Insecurity (04/03/2023)   Hunger Vital Sign    Worried About Running Out of Food in the Last Year: Never true    Ran Out of Food in the Last Year: Never true  Transportation Needs: No Transportation Needs (04/03/2023)   PRAPARE - Administrator, Civil Service (Medical): No    Lack of Transportation (Non-Medical): No  Physical Activity: Insufficiently Active (04/03/2023)   Exercise Vital Sign    Days of Exercise per Week: 3 days    Minutes of Exercise per Session: 10 min  Stress: No Stress Concern Present (04/03/2023)   [employer] of Occupational Health - Occupational Stress Questionnaire    Feeling of Stress : Not at all  Social Connections: Socially Integrated (04/03/2023)   Social Connection and Isolation Panel    Frequency of Communication with Friends and Family: Three times a week    Frequency of Social Gatherings with Friends and Family: Once a week    Attends Religious Services: More than 4 times per year    Active Member of [employer] or Organizations: Yes    Attends Engineer, structural: More than 4 times per year    Marital Status: Married  Catering manager Violence: Not At Risk (04/03/2023)   Humiliation,  Afraid, Rape, and Kick questionnaire    Fear of Current or Ex-Partner: No    Emotionally Abused: No    Physically Abused: No    Sexually Abused: No   Family History  Problem Relation Age of Onset   Heart failure Mother    Esophageal cancer Father    Heart attack Maternal Uncle    Breast cancer Neg Hx    Prostate cancer Neg Hx    Pancreatic cancer Neg Hx  Colon cancer Neg Hx     Review of Systems  All other systems reviewed and are negative.      Objective:   Physical Exam Vitals reviewed.  Constitutional:      General: He is not in acute distress.    Appearance: He is well-developed. He is not diaphoretic.  HENT:     Head: Normocephalic and atraumatic.     Right Ear: Tympanic membrane and ear canal normal.     Left Ear: Tympanic membrane and ear canal normal.     Nose: No congestion or rhinorrhea.     Mouth/Throat:     Mouth: Mucous membranes are moist.     Pharynx: Oropharynx is clear. No oropharyngeal exudate or posterior oropharyngeal erythema.  Eyes:     Conjunctiva/sclera: Conjunctivae normal.  Neck:     Thyroid : No thyromegaly.     Vascular: No JVD.     Trachea: No tracheal deviation.  Cardiovascular:     Rate and Rhythm: Normal rate and regular rhythm.     Heart sounds: Normal heart sounds. No murmur heard.    No friction rub. No gallop.  Pulmonary:     Effort: Pulmonary effort is normal. No respiratory distress.     Breath sounds: Normal breath sounds. No stridor. No wheezing or rales.  Chest:     Chest wall: No tenderness.  Neurological:     Mental Status: He is alert.     Motor: No abnormal muscle tone.    There is no visible rash     Assessment & Plan:  Itching Last year we tried triamcinolone  cream and this was not effective.  Therefore we are going to predose the patient with hydroxyzine .  He can try hydroxyzine  12.5 mg to 25 mg 30 minutes before going outside.  He is only supposed to take the medication if he is on the outside in the heat  or in the grass.  Hopefully this will block the immune response that causes the rash.  Caution the patient about the medicine making him sleepy or dizzy.  If this does not work perhaps we could try something like Claritin  or Allegra that may be milder.

## 2024-01-02 NOTE — Addendum Note (Signed)
 Addended by: ERIKA ELIDA RAMAN on: 01/02/2024 09:28 AM   Modules accepted: Orders

## 2024-01-29 DIAGNOSIS — H353221 Exudative age-related macular degeneration, left eye, with active choroidal neovascularization: Secondary | ICD-10-CM | POA: Diagnosis not present

## 2024-02-06 DIAGNOSIS — H353211 Exudative age-related macular degeneration, right eye, with active choroidal neovascularization: Secondary | ICD-10-CM | POA: Diagnosis not present

## 2024-02-07 DIAGNOSIS — H35723 Serous detachment of retinal pigment epithelium, bilateral: Secondary | ICD-10-CM | POA: Diagnosis not present

## 2024-02-07 DIAGNOSIS — H353231 Exudative age-related macular degeneration, bilateral, with active choroidal neovascularization: Secondary | ICD-10-CM | POA: Diagnosis not present

## 2024-02-07 DIAGNOSIS — H35363 Drusen (degenerative) of macula, bilateral: Secondary | ICD-10-CM | POA: Diagnosis not present

## 2024-02-13 ENCOUNTER — Ambulatory Visit (INDEPENDENT_AMBULATORY_CARE_PROVIDER_SITE_OTHER): Admitting: Family Medicine

## 2024-02-13 ENCOUNTER — Encounter: Payer: Self-pay | Admitting: Family Medicine

## 2024-02-13 ENCOUNTER — Ambulatory Visit: Payer: Self-pay

## 2024-02-13 VITALS — BP 130/72 | HR 57 | Temp 98.0°F | Ht 70.0 in | Wt 201.6 lb

## 2024-02-13 DIAGNOSIS — M5416 Radiculopathy, lumbar region: Secondary | ICD-10-CM

## 2024-02-13 MED ORDER — PREDNISONE 20 MG PO TABS
ORAL_TABLET | ORAL | 0 refills | Status: AC
Start: 2024-02-13 — End: ?

## 2024-02-13 MED ORDER — CYCLOBENZAPRINE HCL 10 MG PO TABS: 10.0000 mg | ORAL_TABLET | Freq: Three times a day (TID) | ORAL | 0 refills | Status: AC | PRN

## 2024-02-13 NOTE — Telephone Encounter (Signed)
 FYI Only or Action Required?: Action required by provider: request for appointment.  Patient was last seen in primary care on 01/02/2024 by Tyler Allison DASEN, MD.  Called Nurse Triage reporting Hip Pain.  Symptoms began several weeks ago.  Interventions attempted: Rest, hydration, or home remedies.  Symptoms are: gradually worsening.  Triage Disposition: See HCP Within 4 Hours (Or PCP Triage)  Patient/caregiver understands and will follow disposition?: YesCopied from CRM [id number]. Topic: Clinical - Red Word Triage >> Feb 13, 2024  8:18 AM Avram MATSU wrote: Red Word that prompted transfer to Nurse Triage: sciatic pain from hip to leg and is wondering if the provider can prescribe predniSONE  (DELTASONE ) 5 MG tablet [[id number]] Reason for Disposition  [1] SEVERE pain (e.g., excruciating, unable to do any normal activities) AND [2] not improved after 2 hours of pain medicine  Answer Assessment - Initial Assessment Questions Almost daily pt wakes up daily with sciatica pain on right hip and shoots down into right leg. Pt has history of 3 back surgeries with recent  MRI that showed deteriorating  discs in back. I feel like I'm getting tased. Pt has done dry needling and prednisone  in past with success. Pt is asking for prednisone  to help with pain.       1. LOCATION and RADIATION: Where is the pain located? Does the pain spread (shoot) anywhere else?     Right hip and right leg 2. QUALITY: What does the pain feel like?  (e.g., sharp, dull, aching, burning)     Sharp/burning  3. SEVERITY: How bad is the pain? What does it keep you from doing?   (Scale 1-10; or mild, moderate, severe)     6 4. ONSET: When did the pain start? Does it come and go, or is it there all the time?     Not sure 5. WORK OR EXERCISE: Has there been any recent work or exercise that involved this part of the body?      na 6. CAUSE: What do you think is causing the hip pain?      Sciatica flare  up 7. AGGRAVATING FACTORS: What makes the hip pain worse? (e.g., walking, climbing stairs, running)     Movement  8. OTHER SYMPTOMS: Do you have any other symptoms? (e.g., back pain, pain shooting down leg,  fever, rash)     Pain shooting down leg  Protocols used: Hip Pain-A-AH

## 2024-02-13 NOTE — Progress Notes (Signed)
 Subjective:  HPI: Tyler Allison is a 83 y.o. male presenting on 02/13/2024 for Acute Visit (Rt hip pain feels like a knot in hip, with burning in legs.x 2-3 weeks /has been told that he has deteriorating disks by neurosurgeon, has had MRI and was told he has hip problems by ortho. But neither will assist with pain. He has had PT. With dry needling and it also did not assist )   HPI Patient is in today for 2-3 weeks right hip pain with burning that radiates to his right leg. The pain is described as the sensation of a knot in his hip. Has been told that he has deteriorating disks by neurosurgeon, has had MRI and was told he has hip problems by ortho. But neither will assist with pain. He has had PT. With dry needling and it also did not assist  The pain is worse in the AM described as burning and tingling from his right hip to the bottom of his foot. [facility] 2 months ago [facility] and it was concluded this was likely resulting from his hip. He also sees an orthopedist who told him he believed it was his back based on hip x-rays. He has tried PT and dry needling as well as prednisone . His insurance will no longer cover dry needing. Has not had prednisone  in 5-6 weeks.  Denies saddle numbness, incontinence of stool or urine, lower extremity weakness. He did leave a message with [place] [facility] yesterday. Flexeril  has also been helping and he is requesting a refill.  Review of Systems  All other systems reviewed and are negative.   Relevant past medical history reviewed and updated as indicated.   Past Medical History:  Diagnosis Date   Allergy    Anxiety    Asthma    Cancer (HCC)    prostate cancer   Cervical spondylosis with radiculopathy    left c6 radiculopathy   Chronic back pain    GERD (gastroesophageal reflux disease)    Hyperlipidemia    Hypertension    PAF (paroxysmal atrial fibrillation) (HCC)    Prostate cancer (HCC)      Past Surgical History:   Procedure Laterality Date   BACK SURGERY     MICROLARYNGOSCOPY W/VOCAL CORD INJECTION N/A 10/30/2020   Procedure: MICRODIRECT LARYNGOSCOPY WITH VOCAL CORD INJECTION AND CO2 LASER APPLICATION;  Surgeon: Carlie Clark, MD;  Location: [facility] OR;  Service: ENT;  Laterality: N/A;   PROSTATE BIOPSY     SPINE SURGERY     back surgery x 3 including lumbar fusion, neck surgery x 2 including C4-6 ACDF   tympanoplasty with mastoidectomy Left 01/13/2004    Allergies and medications reviewed and updated.   Current Outpatient Medications:    predniSONE  (DELTASONE ) 20 MG tablet, 3 tabs poqday 1-2, 2 tabs poqday 3-4, 1 tab poqday 5-6, Disp: 12 tablet, Rfl: 0   acetaminophen  (TYLENOL ) 500 MG tablet, Take 500 mg by mouth every 6 (six) hours as needed for moderate pain., Disp: , Rfl:    apixaban  (ELIQUIS ) 5 MG TABS tablet, Take 1 tablet (5 mg total) by mouth 2 (two) times daily., Disp: 180 tablet, Rfl: 3   atorvastatin  (LIPITOR) 40 MG tablet, TAKE 1 TABLET(40 MG) BY MOUTH DAILY, Disp: 90 tablet, Rfl: 3   clonazePAM  (KLONOPIN ) 0.5 MG tablet, TAKE 1 TABLET(0.5 MG) BY MOUTH TWICE DAILY AS NEEDED FOR ANXIETY, Disp: 60 tablet, Rfl: 1   cyclobenzaprine  (FLEXERIL ) 10 MG tablet, Take 1 tablet (10  mg total) by mouth 3 (three) times daily as needed for muscle spasms., Disp: 30 tablet, Rfl: 0   fluticasone  (FLONASE ) 50 MCG/ACT nasal spray, INSTILL TWO SPRAYS IN EACH NOSTRIL DAILY AT NIGHT, Disp: 16 mL, Rfl: 1   hydrOXYzine  (ATARAX ) 25 MG tablet, Take 1 tablet (25 mg total) by mouth 3 (three) times daily as needed for itching (take minutes before yard work)., Disp: 30 tablet, Rfl: 0   ipratropium-albuterol  (DUONEB) 0.5-2.5 (3) MG/3ML SOLN, Take 3 mLs by nebulization every 6 (six) hours as needed for up to 12 doses., Disp: 36 mL, Rfl: 0   metoprolol  succinate (TOPROL -XL) 25 MG 24 hr tablet, Take 1 tablet (25 mg total) by mouth every evening., Disp: 90 tablet, Rfl: 3   pantoprazole  (PROTONIX ) 40 MG tablet, TAKE 1 TABLET(40 MG)  BY MOUTH TWICE DAILY. APPOINTMENT NEEDED IN OFFICE BEFORE FURTHER REFILLS., Disp: 60 tablet, Rfl: 0   tamsulosin  (FLOMAX ) 0.4 MG CAPS capsule, Take 0.4 mg by mouth daily., Disp: , Rfl:    traMADol  (ULTRAM ) 50 MG tablet, Take 1 tablet (50 mg total) by mouth at bedtime as needed for moderate pain., Disp: 30 tablet, Rfl: 0  Allergies  Allergen Reactions   Codeine Nausea And Vomiting    Objective:   BP 130/72   Pulse (!) 57   Temp 98 F (36.7 C)   Ht 5' 10 (1.778 m)   Wt 201 lb 9.6 oz (91.4 kg)   SpO2 96%   BMI 28.93 kg/m      02/13/2024    9:08 AM 01/02/2024    8:48 AM 05/31/2023    3:12 PM  Vitals with BMI  Height 5' 10 5' 10 5' 10  Weight 201 lbs 10 oz 201 lbs 10 oz 208 lbs  BMI 28.93 28.93 29.84  Systolic 130 136 879  Diastolic 72 76 66  Pulse 57 57 67     Physical Exam Vitals and nursing note reviewed.  Constitutional:      Appearance: Normal appearance. He is normal weight.  HENT:     Head: Normocephalic and atraumatic.  Musculoskeletal:     Lumbar back: Tenderness present. Positive right straight leg raise test.       Back:  Skin:    General: Skin is warm and dry.     Capillary Refill: Capillary refill takes less than 2 seconds.  Neurological:     General: No focal deficit present.     Mental Status: He is alert and oriented to person, place, and time. Mental status is at baseline.  Psychiatric:        Mood and Affect: Mood normal.        Behavior: Behavior normal.        Thought Content: Thought content normal.        Judgment: Judgment normal.     Assessment & Plan:  Lumbar radiculopathy Assessment & Plan: Acute on chronic right sciatica. This has been extensively evaluated by his PCP, orthopedics, and neurosurgery. He is considering a second opinion but declined referral today. Has not been able to accord to continue dry needling however this was helping. Acute worsening over last 2-3 weeks. Has contacted orthopedics and is awaiting a return phone  call. No red flags on exam. Will start prednisone  taper. Provided refill for flexeril  as this was previously helping him. Advised on safe use. Follow up with PCP, ortho, or neuro PRN   Other orders -     Cyclobenzaprine  HCl; Take 1 tablet (10 mg  total) by mouth 3 (three) times daily as needed for muscle spasms.  Dispense: 30 tablet; Refill: 0 -     predniSONE ; 3 tabs poqday 1-2, 2 tabs poqday 3-4, 1 tab poqday 5-6  Dispense: 12 tablet; Refill: 0     Follow up plan: Return if symptoms worsen or fail to improve.  Jeoffrey GORMAN Barrio, FNP

## 2024-02-13 NOTE — Assessment & Plan Note (Signed)
 Acute on chronic right sciatica. This has been extensively evaluated by his PCP, orthopedics, and neurosurgery. He is considering a second opinion but declined referral today. Has not been able to accord to continue dry needling however this was helping. Acute worsening over last 2-3 weeks. Has contacted orthopedics and is awaiting a return phone call. No red flags on exam. Will start prednisone  taper. Provided refill for flexeril  as this was previously helping him. Advised on safe use. Follow up with PCP, ortho, or neuro PRN

## 2024-03-01 ENCOUNTER — Other Ambulatory Visit: Payer: Self-pay

## 2024-03-01 ENCOUNTER — Ambulatory Visit
Admission: RE | Admit: 2024-03-01 | Discharge: 2024-03-01 | Disposition: A | Discharge status: Home / Self Care | Source: Ambulatory Visit | Attending: Nurse Practitioner | Admitting: Nurse Practitioner

## 2024-03-01 ENCOUNTER — Ambulatory Visit: Payer: Self-pay

## 2024-03-01 VITALS — BP 110/72 | HR 60 | Temp 97.8°F | Resp 20

## 2024-03-01 DIAGNOSIS — J0100 Acute maxillary sinusitis, unspecified: Secondary | ICD-10-CM | POA: Diagnosis not present

## 2024-03-01 MED ORDER — BENZONATATE 100 MG PO CAPS: 100.0000 mg | ORAL_CAPSULE | Freq: Three times a day (TID) | ORAL | 0 refills | Status: AC | PRN

## 2024-03-01 MED ORDER — AMOXICILLIN-POT CLAVULANATE 875-125 MG PO TABS: 1.0000 | ORAL_TABLET | Freq: Two times a day (BID) | ORAL | 0 refills | Status: AC

## 2024-03-01 NOTE — Telephone Encounter (Signed)
 Pt seen at [facility].

## 2024-03-01 NOTE — Telephone Encounter (Signed)
 FYI Only or Action Required?: FYI only for provider.  Patient was last seen in primary care on 02/13/2024 by Kayla Jeoffrey RAMAN, FNP.  Called Nurse Triage reporting Cough.  Symptoms began several weeks ago.  Interventions attempted: OTC medications: tylenol , cough drops.  Symptoms are: gradually worsening.  Triage Disposition: See Physician Within 24 Hours  Patient/caregiver understands and will follow disposition?: Yes  Copied from CRM #[id number]. Topic: Clinical - Red Word Triage >> Mar 01, 2024  1:00 PM Shanda MATSU wrote: Red Word that prompted transfer to Nurse Triage: Patient is reporting raw throat, bad head ache, coughing up phlegm for about 2 weeks, seems to be getting worse. Reason for Disposition  [1] Continuous (nonstop) coughing interferes with work or school AND [2] no improvement using cough treatment per Care Advice  Answer Assessment - Initial Assessment Questions Coughing every 1-73min, headache, scratchy eyes, raw throat, chest congestion. Denies fever, SOB, Chest pain, or dizziness.  Inhaler- used 2-3days ago, occasional wheezing per wife.  Tylenol  with no relief. Tramadol  for back pain . Cough drops   [facility] appointment made. Patients wife Gwendlyn this weekend and looking forward to celebrating with her but not like this. ED/Call back precautions given.   1. ONSET: When did the cough begin?      2 weeks ago 2. SEVERITY: How bad is the cough today?      Coughing every 2 min  3. SPUTUM: Describe the color of your sputum (e.g., none, dry cough; clear, white, yellow, green)     white 4. HEMOPTYSIS: Are you coughing up any blood? If Yes, ask: How much? (e.g., flecks, streaks, tablespoons, etc.)     Denies 5. DIFFICULTY BREATHING: Are you having difficulty breathing? If Yes, ask: How bad is it? (e.g., mild, moderate, severe)      Catching his breath after coughing fit but not short of breath.  6. FEVER: Do you have a fever? If Yes, ask: What is your temperature,  how was it measured, and when did it start?     Denies  7. CARDIAC HISTORY: Do you have any history of heart disease? (e.g., heart attack, congestive heart failure)      HTN  8. LUNG HISTORY: Do you have any history of lung disease?  (e.g., pulmonary embolus, asthma, emphysema)     Asthma 9. PE RISK FACTORS: Do you have a history of blood clots? (or: recent major surgery, recent prolonged travel, bedridden)     Denies  10. OTHER SYMPTOMS: Do you have any other symptoms? (e.g., runny nose, wheezing, chest pain)       Runny nose, wheezing, chest congestion, cough, sore throat 12. TRAVEL: Have you traveled out of the country in the last month? (e.g., travel history, exposures)       denies  Protocols used: Cough - Acute Productive-A-AH

## 2024-03-01 NOTE — Discharge Instructions (Signed)
 You have a bacterial sinus infection.  Take the Augmentin twice a day for 7 days as prescribed to treat it.  Also recommend drinking plenty of fluids to thin out the mucus, guaifenesin  600 mg twice daily, and Tessalon  Perles every 8 hours as needed for the cough.  Seek care if symptoms persist or worsen despite treatment.

## 2024-03-01 NOTE — ED Triage Notes (Signed)
 Pt reports productive cough and nasal congestion,headache x2 weeks. Denies fever. NAD noted. Has tried otc medication with no change in symptoms. Is taking blood thinner.

## 2024-03-01 NOTE — ED Provider Notes (Signed)
 [facility]    CSN: [id number] Arrival date & time: 03/01/24  1346      History   Chief Complaint Chief Complaint  Patient presents with   Cough    no fever - Entered by patient    HPI Tyler Allison is a 83 y.o. male.   Patient presents today with more than 2-week history of congested cough, chest and nasal congestion, runny nose, burning throat worse after coughing, sinus pressure and headache in his cheeks.  He denies fever, body aches or chills, shortness of breath or chest pain, ear pain, abdominal pain, nausea/vomiting, and diarrhea.  Reports his wife is sick with similar symptoms when symptoms first began.  Has been taking over-the-counter cough medicine without relief.    Past Medical History:  Diagnosis Date   Allergy    Anxiety    Asthma    Cancer (HCC)    prostate cancer   Cervical spondylosis with radiculopathy    left c6 radiculopathy   Chronic back pain    GERD (gastroesophageal reflux disease)    Hyperlipidemia    Hypertension    PAF (paroxysmal atrial fibrillation) (HCC)    Prostate cancer [facility])     Patient Active Problem List   Diagnosis Date Noted   Encounter for orthopedic follow-up care 06/09/2023   Lipoma of forearm 05/23/2023   Acquired trigger finger of left middle finger 05/22/2023   Osteoarthritis of left hip 05/14/2023   Acute nonintractable headache 04/19/2023   History of left mastoidectomy 03/31/2023   Dyspnea on exertion 03/07/2023   Ischial bursitis 12/06/2022   Fatigue 11/30/2022   Anemia 11/30/2022   Nasal vestibulitis 10/06/2022   Acquired thrombophilia 09/19/2022   Lumbar post-laminectomy syndrome 09/19/2022   Lumbar radiculopathy 09/19/2022   Pain of right hip joint 09/02/2022   Radial styloid tenosynovitis of right hand 06/18/2022   Pain in finger of right hand 06/15/2022   Right wrist pain 06/15/2022   Seasonal allergic rhinitis 08/20/2021   Perforation of left tympanic membrane 06/21/2021   Tympanic  membrane retraction, left 06/21/2021   Impacted cerumen of left ear 05/26/2021   Excessive cerumen in ear canal, left 05/26/2021   Malignant neoplasm of prostate (HCC) 11/03/2020   Anticoagulant long-term use 10/13/2020   Dysphonia 10/13/2020   Aortic atherosclerosis 09/11/2020   Hypertension 09/11/2020   Body mass index (BMI) 29.0-29.9, adult 07/14/2020   Elevated blood-pressure reading, without diagnosis of hypertension 07/14/2020   Neck pain 07/14/2020   Pain in thoracic spine 07/14/2020   Close exposure to COVID-19 virus 06/08/2020   Central sleep apnea 02/07/2020   Nocturnal hypoxemia 03/01/2019   GERD (gastroesophageal reflux disease) 10/24/2018   Paroxysmal atrial fibrillation (HCC) 07/01/2016   Hyperlipidemia 07/01/2016   Pain in left arm 05/10/2016   Cervical spondylosis with radiculopathy    Asthmatic bronchitis, mild persistent, uncomplicated 11/02/2012   Chronic back pain    ADVERSE DRUG REACTION 08/24/2009   Asthma 08/19/2009   PULMONARY NODULE 08/19/2009   COUGH 08/19/2009   PURE HYPERCHOLESTEROLEMIA 04/24/2009    Past Surgical History:  Procedure Laterality Date   BACK SURGERY     MICROLARYNGOSCOPY W/VOCAL CORD INJECTION N/A 10/30/2020   Procedure: MICRODIRECT LARYNGOSCOPY WITH VOCAL CORD INJECTION AND CO2 LASER APPLICATION;  Surgeon: Carlie Clark, MD;  Location: [facility] OR;  Service: ENT;  Laterality: N/A;   PROSTATE BIOPSY     SPINE SURGERY     back surgery x 3 including lumbar fusion, neck surgery x 2 including C4-6 ACDF  tympanoplasty with mastoidectomy Left 01/13/2004       Home Medications    Prior to Admission medications   Medication Sig Start Date End Date Taking? Authorizing Provider  amoxicillin -clavulanate (AUGMENTIN) 875-125 MG tablet Take 1 tablet by mouth 2 (two) times daily for 7 days. 03/01/24 03/08/24 Yes Chandra Harlene LABOR, NP  benzonatate  (TESSALON ) 100 MG capsule Take 1 capsule (100 mg total) by mouth 3 (three) times daily as needed for  cough. Do not take with alcohol or while operating or driving heavy machinery 89/75/74  Yes Chandra Harlene A, NP  acetaminophen  (TYLENOL ) 500 MG tablet Take 500 mg by mouth every 6 (six) hours as needed for moderate pain.    [provider]  apixaban  (ELIQUIS ) 5 MG TABS tablet Take 1 tablet (5 mg total) by mouth 2 (two) times daily. 09/19/23   Duanne Butler DASEN, MD  atorvastatin  (LIPITOR) 40 MG tablet TAKE 1 TABLET(40 MG) BY MOUTH DAILY 09/19/23   Duanne Butler DASEN, MD  clonazePAM  (KLONOPIN ) 0.5 MG tablet TAKE 1 TABLET(0.5 MG) BY MOUTH TWICE DAILY AS NEEDED FOR ANXIETY 10/25/19   Duanne Butler DASEN, MD  cyclobenzaprine  (FLEXERIL ) 10 MG tablet Take 1 tablet (10 mg total) by mouth 3 (three) times daily as needed for muscle spasms. 02/13/24   Kayla Jeoffrey RAMAN, FNP  fluticasone  (FLONASE ) 50 MCG/ACT nasal spray INSTILL TWO SPRAYS IN EACH NOSTRIL DAILY AT NIGHT 10/30/23   Duanne Butler DASEN, MD  hydrOXYzine  (ATARAX ) 25 MG tablet Take 1 tablet (25 mg total) by mouth 3 (three) times daily as needed for itching (take minutes before yard work). 01/02/24   Duanne Butler DASEN, MD  ipratropium-albuterol  (DUONEB) 0.5-2.5 (3) MG/3ML SOLN Take 3 mLs by nebulization every 6 (six) hours as needed for up to 12 doses. 02/14/23   Jerral Meth, MD  metoprolol  succinate (TOPROL -XL) 25 MG 24 hr tablet Take 1 tablet (25 mg total) by mouth every evening. 06/27/23   Duanne Butler DASEN, MD  pantoprazole  (PROTONIX ) 40 MG tablet TAKE 1 TABLET(40 MG) BY MOUTH TWICE DAILY. APPOINTMENT NEEDED IN OFFICE BEFORE FURTHER REFILLS. 01/01/24   Duanne Butler DASEN, MD  predniSONE  (DELTASONE ) 20 MG tablet 3 tabs poqday 1-2, 2 tabs poqday 3-4, 1 tab poqday 5-6 02/13/24   Kayla Jeoffrey RAMAN, FNP  tamsulosin  (FLOMAX ) 0.4 MG CAPS capsule Take 0.4 mg by mouth daily. 02/22/23   [provider]  traMADol  (ULTRAM ) 50 MG tablet Take 1 tablet (50 mg total) by mouth at bedtime as needed for moderate pain. 12/02/22   Duanne Butler DASEN, MD    Family  History Family History  Problem Relation Age of Onset   Heart failure Mother    Esophageal cancer Father    Heart attack Maternal Uncle    Breast cancer Neg Hx    Prostate cancer Neg Hx    Pancreatic cancer Neg Hx    Colon cancer Neg Hx     Social History Social History   Tobacco Use   Smoking status: Former    Current packs/day: 0.00    Average packs/day: 0.5 packs/day for 13.5 years (6.7 ttl pk-yrs)    Types: Cigarettes    Start date: 56    Quit date: 10/23/1968    Years since quitting: 55.3   Smokeless tobacco: Never  Vaping Use   Vaping status: Never Used  Substance Use Topics   Alcohol use: No   Drug use: No     Allergies   Codeine   Review of Systems Review of  Systems Per HPI  Physical Exam Triage Vital Signs ED Triage Vitals  Encounter Vitals Group     BP 03/01/24 1359 110/72     Girls Systolic BP Percentile --      Girls Diastolic BP Percentile --      Boys Systolic BP Percentile --      Boys Diastolic BP Percentile --      Pulse Rate 03/01/24 1359 60     Resp 03/01/24 1359 20     Temp 03/01/24 1359 97.8 F (36.6 C)     Temp Source 03/01/24 1359 Oral     SpO2 03/01/24 1359 95 %     Weight --      Height --      Head Circumference --      Peak Flow --      Pain Score 03/01/24 1357 6     Pain Loc --      Pain Education --      Exclude from Growth Chart --    No data found.  Updated Vital Signs BP 110/72 (BP Location: Right Arm)   Pulse 60   Temp 97.8 F (36.6 C) (Oral)   Resp 20   SpO2 95%   Visual Acuity Right Eye Distance:   Left Eye Distance:   Bilateral Distance:    Right Eye Near:   Left Eye Near:    Bilateral Near:     Physical Exam Vitals and nursing note reviewed.  Constitutional:      General: He is not in acute distress.    Appearance: Normal appearance. He is not ill-appearing or toxic-appearing.  HENT:     Head: Normocephalic and atraumatic.     Right Ear: Tympanic membrane, ear canal and external ear normal.      Left Ear: Tympanic membrane, ear canal and external ear normal.     Nose: No congestion or rhinorrhea.     Right Sinus: Maxillary sinus tenderness present.     Left Sinus: Maxillary sinus tenderness present.     Mouth/Throat:     Mouth: Mucous membranes are moist.     Pharynx: Oropharynx is clear. Postnasal drip present. No oropharyngeal exudate or posterior oropharyngeal erythema.  Eyes:     General: No scleral icterus.    Extraocular Movements: Extraocular movements intact.  Cardiovascular:     Rate and Rhythm: Normal rate and regular rhythm.  Pulmonary:     Effort: Pulmonary effort is normal. No respiratory distress.     Breath sounds: Normal breath sounds. No wheezing, rhonchi or rales.  Musculoskeletal:     Cervical back: Normal range of motion and neck supple.  Lymphadenopathy:     Cervical: No cervical adenopathy.  Skin:    General: Skin is warm and dry.     Coloration: Skin is not jaundiced or pale.     Findings: No erythema or rash.  Neurological:     Mental Status: He is alert and oriented to person, place, and time.  Psychiatric:        Behavior: Behavior is cooperative.      UC Treatments / Results  Labs (all labs ordered are listed, but only abnormal results are displayed) Labs Reviewed - No data to display  EKG   Radiology No results found.  Procedures Procedures (including critical care time)  Medications Ordered in UC Medications - No data to display  Initial Impression / Assessment and Plan / UC Course  I have reviewed the triage vital signs and  the nursing notes.  Pertinent labs & imaging results that were available during my care of the patient were reviewed by me and considered in my medical decision making (see chart for details).   Patient is well-appearing, normotensive, afebrile, not tachycardic, not tachypneic, oxygenating well on room air.   1. Acute non-recurrent maxillary sinusitis No red flags in history or on exam Vital signs  are stable Given length of symptoms, will treat for sinusitis with Augmentin twice daily for 7 days Other supportive care discussed including guaifenesin , cough suppressant medication ER and return precautions discussed with patient  The patient was given the opportunity to ask questions.  All questions answered to their satisfaction.  The patient is in agreement to this plan.   Final Clinical Impressions(s) / UC Diagnoses   Final diagnoses:  Acute non-recurrent maxillary sinusitis     Discharge Instructions      You have a bacterial sinus infection.  Take the Augmentin twice a day for 7 days as prescribed to treat it.  Also recommend drinking plenty of fluids to thin out the mucus, guaifenesin  600 mg twice daily, and Tessalon  Perles every 8 hours as needed for the cough.  Seek care if symptoms persist or worsen despite treatment.    ED Prescriptions     Medication Sig Dispense Auth. Provider   amoxicillin -clavulanate (AUGMENTIN) 875-125 MG tablet Take 1 tablet by mouth 2 (two) times daily for 7 days. 14 tablet Chandra Raisin A, NP   benzonatate  (TESSALON ) 100 MG capsule Take 1 capsule (100 mg total) by mouth 3 (three) times daily as needed for cough. Do not take with alcohol or while operating or driving heavy machinery 21 capsule Chandra Raisin LABOR, NP      PDMP not reviewed this encounter.   Chandra Raisin LABOR, NP 03/01/24 1415

## 2024-03-04 ENCOUNTER — Other Ambulatory Visit: Payer: Self-pay | Admitting: Family Medicine

## 2024-03-04 DIAGNOSIS — K219 Gastro-esophageal reflux disease without esophagitis: Secondary | ICD-10-CM

## 2024-03-04 DIAGNOSIS — H353221 Exudative age-related macular degeneration, left eye, with active choroidal neovascularization: Secondary | ICD-10-CM | POA: Diagnosis not present

## 2024-03-11 ENCOUNTER — Other Ambulatory Visit: Payer: Self-pay

## 2024-03-11 ENCOUNTER — Telehealth: Payer: Self-pay

## 2024-03-11 MED ORDER — FLUTICASONE PROPIONATE 50 MCG/ACT NA SUSP: NASAL | 1 refills | Status: DC

## 2024-03-11 NOTE — Telephone Encounter (Signed)
 Sent in medication

## 2024-03-11 NOTE — Telephone Encounter (Signed)
 Prescription Request  03/11/2024  LOV: 02/13/24  What is the name of the medication or equipment? fluticasone  (FLONASE ) 50 MCG/ACT nasal spray [[id number]]   Have you contacted your pharmacy to request a refill? Yes   Which pharmacy would you like this sent to?  [id number] [address], [place] - [address] [place] [address] Phone: [phone] Fax: [phone]    Patient notified that their request is being sent to the clinical staff for review and that they should receive a response within 2 business days.   Please advise at [facility] [phone]

## 2024-03-29 ENCOUNTER — Other Ambulatory Visit: Payer: Self-pay

## 2024-03-29 DIAGNOSIS — K219 Gastro-esophageal reflux disease without esophagitis: Secondary | ICD-10-CM

## 2024-03-29 NOTE — Telephone Encounter (Signed)
 Prescription Request  03/29/2024  LOV: 02/13/24  What is the name of the medication or equipment? pantoprazole  (PROTONIX ) 40 MG tablet [[id number]]   Have you contacted your pharmacy to request a refill? Yes   Which pharmacy would you like this sent to?  [id number] [address], [place] - [address] [place] [address] Phone: [phone] Fax: [phone]    Patient notified that their request is being sent to the clinical staff for review and that they should receive a response within 2 business days.   Please advise at [facility] [phone]

## 2024-03-31 NOTE — Telephone Encounter (Signed)
 Requested medication (s) are due for refill today - yes  Requested medication (s) are on the active medication list -yes  Future visit scheduled -yes- 04/03/24  Last refill: 03/05/24 #30  Notes to clinic: last refill has notes- needs appointment before further refills  Requested Prescriptions  Pending Prescriptions Disp Refills   pantoprazole  (PROTONIX ) 40 MG tablet 60 tablet 0    Sig: TAKE 1 TABLET(40 MG) BY MOUTH TWICE DAILY. APPOINTMENT NEEDED IN OFFICE BEFORE FURTHER REFILLS.     Gastroenterology: Proton Pump Inhibitors Failed - 03/31/2024 11:05 AM      Failed - Valid encounter within last 12 months    Recent Outpatient Visits           1 month ago Lumbar radiculopathy   [facility] Kayla Jeoffrey RAMAN, FNP   2 months ago Itching   [facility] Duanne Butler DASEN, MD   11 months ago Acute nonintractable headache, unspecified headache type   [facility] Kayla Jeoffrey RAMAN, FNP   1 year ago Palpitations   [facility] Duanne Butler DASEN, MD   1 year ago Acute cystitis with hematuria   [facility] Pickard, Butler DASEN, MD       Future Appointments             In 1 week Cleaver, Josefa HERO, NP [facility] at [facility] A Dept of [employer]               Requested Prescriptions  Pending Prescriptions Disp Refills   pantoprazole  (PROTONIX ) 40 MG tablet 60 tablet 0    Sig: TAKE 1 TABLET(40 MG) BY MOUTH TWICE DAILY. APPOINTMENT NEEDED IN OFFICE BEFORE FURTHER REFILLS.     Gastroenterology: Proton Pump Inhibitors Failed - 03/31/2024 11:05 AM      Failed - Valid encounter within last 12 months    Recent Outpatient Visits           1 month ago Lumbar radiculopathy   [facility] Kayla Jeoffrey RAMAN, FNP   2 months ago Itching   [facility] Duanne Butler DASEN, MD   11  months ago Acute nonintractable headache, unspecified headache type   [facility] Kayla Jeoffrey RAMAN, FNP   1 year ago Palpitations   [facility] Duanne Butler DASEN, MD   1 year ago Acute cystitis with hematuria   [facility] Pickard, Butler DASEN, MD       Future Appointments             In 1 week Cleaver, Josefa HERO, NP [facility] at [facility] A Dept of [employer]. [facility], [facility]

## 2024-04-01 MED ORDER — PANTOPRAZOLE SODIUM 40 MG PO TBEC: DELAYED_RELEASE_TABLET | ORAL | 1 refills | Status: DC

## 2024-04-03 ENCOUNTER — Ambulatory Visit

## 2024-04-03 VITALS — Ht 70.0 in | Wt 201.0 lb

## 2024-04-03 DIAGNOSIS — Z0000 Encounter for general adult medical examination without abnormal findings: Secondary | ICD-10-CM | POA: Diagnosis not present

## 2024-04-03 NOTE — Progress Notes (Signed)
 Chief Complaint  Patient presents with   Medicare Wellness     Subjective:   Tyler Allison is a 83 y.o. male who presents for a Medicare Annual Wellness Visit.  Allergies (verified) Codeine   History: Past Medical History:  Diagnosis Date   Allergy    Anxiety    Asthma    Cancer (HCC)    prostate cancer   Cervical spondylosis with radiculopathy    left c6 radiculopathy   Chronic back pain    GERD (gastroesophageal reflux disease)    Hyperlipidemia    Hypertension    PAF (paroxysmal atrial fibrillation) (HCC)    Prostate cancer (HCC)    Past Surgical History:  Procedure Laterality Date   BACK SURGERY     MICROLARYNGOSCOPY W/VOCAL CORD INJECTION N/A 10/30/2020   Procedure: MICRODIRECT LARYNGOSCOPY WITH VOCAL CORD INJECTION AND CO2 LASER APPLICATION;  Surgeon: Carlie Clark, MD;  Location: [facility] OR;  Service: ENT;  Laterality: N/A;   PROSTATE BIOPSY     SPINE SURGERY     back surgery x 3 including lumbar fusion, neck surgery x 2 including C4-6 ACDF   tympanoplasty with mastoidectomy Left 01/13/2004   Family History  Problem Relation Age of Onset   Heart failure Mother    Esophageal cancer Father    Heart attack Maternal Uncle    Breast cancer Neg Hx    Prostate cancer Neg Hx    Pancreatic cancer Neg Hx    Colon cancer Neg Hx    Social History   Occupational History    Comment: retired  Tobacco Use   Smoking status: Former    Current packs/day: 0.00    Average packs/day: 0.5 packs/day for 13.5 years (6.7 ttl pk-yrs)    Types: Cigarettes    Start date: 34    Quit date: 10/23/1968    Years since quitting: 55.4   Smokeless tobacco: Never  Vaping Use   Vaping status: Never Used  Substance and Sexual Activity   Alcohol use: No   Drug use: No   Sexual activity: Not Currently    Comment: married, retired.   Tobacco Counseling Counseling given: Not Answered  SDOH Screenings   Food Insecurity: No Food Insecurity (04/03/2024)  Housing: Low Risk   (04/03/2024)  Transportation Needs: No Transportation Needs (04/03/2024)  Utilities: Not At Risk (04/03/2024)  Alcohol Screen: Low Risk  (04/03/2023)  Depression (PHQ2-9): Low Risk  (04/03/2024)  Financial Resource Strain: Medium Risk (04/03/2023)  Physical Activity: Insufficiently Active (04/03/2024)  Social Connections: Socially Integrated (04/03/2024)  Stress: No Stress Concern Present (04/03/2024)  Tobacco Use: Medium Risk (04/03/2024)  Health Literacy: Adequate Health Literacy (04/03/2024)   See flowsheets for full screening details  Depression Screen PHQ 2 & 9 Depression Scale- Over the past 2 weeks, how often have you been bothered by any of the following problems? Little interest or pleasure in doing things: 0 Feeling down, depressed, or hopeless (PHQ Adolescent also includes...irritable): 0 PHQ-2 Total Score: 0 Trouble falling or staying asleep, or sleeping too much: 1 Feeling tired or having little energy: 1 Poor appetite or overeating (PHQ Adolescent also includes...weight loss): 0 Feeling bad about yourself - or that you are a failure or have let yourself or your family down: 0 Trouble concentrating on things, such as reading the newspaper or watching television (PHQ Adolescent also includes...like school work): 0 Moving or speaking so slowly that other people could have noticed. Or the opposite - being so fidgety or restless that you have  been moving around a lot more than usual: 0 Thoughts that you would be better off dead, or of hurting yourself in some way: 0 PHQ-9 Total Score: 2 If you checked off any problems, how difficult have these problems made it for you to do your work, take care of things at home, or get along with other people?: Somewhat difficult     Goals Addressed             This Visit's Progress    Remain active and independent   On track      Visit info / Clinical Intake: Medicare Wellness Visit Type:: Subsequent Annual Wellness Visit Persons  participating in visit:: patient Medicare Wellness Visit Mode:: Telephone If telephone:: video declined Because this visit was a virtual/telehealth visit:: vitals recorded from last visit If Telephone or Video please confirm:: I connected with the patient using audio enabled telemedicine application and verified that I am speaking with the correct person using two identifiers; The patient expressed understanding and agreed to proceed; I discussed the limitations of evaluation and management by telemedicine Patient Location:: home Provider Location:: office Information given by:: patient Interpreter Needed?: No Pre-visit prep was completed: yes AWV questionnaire completed by patient prior to visit?: no Living arrangements:: lives with spouse/significant other Patient's Overall Health Status Rating: good Typical amount of pain: some Does pain affect daily life?: (!) yes Are you currently prescribed opioids?: no  Dietary Habits and Nutritional Risks How many meals a day?: 3 Eats fruit and vegetables daily?: yes Most meals are obtained by: having others provide food Diabetic:: no  Functional Status Activities of Daily Living (to include ambulation/medication): Independent Ambulation: Independent Medication Administration: Independent Home Management: Independent Manage your own finances?: yes Primary transportation is: driving Concerns about vision?: no *vision screening is required for WTM* Concerns about hearing?: no  Fall Screening Falls in the past year?: 0 Number of falls in past year: 0 Was there an injury with Fall?: 0 Fall Risk Category Calculator: 0 Patient Fall Risk Level: Low Fall Risk  Fall Risk Patient at Risk for Falls Due to: Impaired mobility Fall risk Follow up: Falls evaluation completed; Education provided; Falls prevention discussed  Home and Transportation Safety: All rugs have non-skid backing?: yes All stairs or steps have railings?: yes Grab bars in  the bathtub or shower?: yes Have non-skid surface in bathtub or shower?: yes Good home lighting?: yes Regular seat belt use?: yes Hospital stays in the last year:: no  Cognitive Assessment Difficulty concentrating, remembering, or making decisions? : no Will 6CIT or Mini Cog be Completed: no 6CIT or Mini Cog Declined: patient alert, oriented, able to answer questions appropriately and recall recent events  Advance Directives (For Healthcare) Does Patient Have a Medical Advance Directive?: No Would patient like information on creating a medical advance directive?: Yes (MAU/Ambulatory/Procedural Areas - Information given)  Reviewed/Updated  Reviewed/Updated: Reviewed All (Medical, Surgical, Family, Medications, Allergies, Care Teams, Patient Goals)        Objective:    Today's Vitals   04/03/24 1001  Weight: 201 lb (91.2 kg)  Height: 5' 10 (1.778 m)   Body mass index is 28.84 kg/m.  Current Medications (verified) Outpatient Encounter Medications as of 04/03/2024  Medication Sig   acetaminophen  (TYLENOL ) 500 MG tablet Take 500 mg by mouth every 6 (six) hours as needed for moderate pain.   apixaban  (ELIQUIS ) 5 MG TABS tablet Take 1 tablet (5 mg total) by mouth 2 (two) times daily.   atorvastatin  (LIPITOR) 40  MG tablet TAKE 1 TABLET(40 MG) BY MOUTH DAILY   benzonatate  (TESSALON ) 100 MG capsule Take 1 capsule (100 mg total) by mouth 3 (three) times daily as needed for cough. Do not take with alcohol or while operating or driving heavy machinery   clonazePAM  (KLONOPIN ) 0.5 MG tablet TAKE 1 TABLET(0.5 MG) BY MOUTH TWICE DAILY AS NEEDED FOR ANXIETY   cyclobenzaprine  (FLEXERIL ) 10 MG tablet Take 1 tablet (10 mg total) by mouth 3 (three) times daily as needed for muscle spasms.   fluticasone  (FLONASE ) 50 MCG/ACT nasal spray instill two SPRAYS in each nostril daily AT night   hydrOXYzine  (ATARAX ) 25 MG tablet Take 1 tablet (25 mg total) by mouth 3 (three) times daily as needed for  itching (take minutes before yard work).   ipratropium-albuterol  (DUONEB) 0.5-2.5 (3) MG/3ML SOLN Take 3 mLs by nebulization every 6 (six) hours as needed for up to 12 doses.   metoprolol  succinate (TOPROL -XL) 25 MG 24 hr tablet Take 1 tablet (25 mg total) by mouth every evening.   pantoprazole  (PROTONIX ) 40 MG tablet TAKE 1 TABLET(40 MG) BY MOUTH TWICE DAILY.   tamsulosin  (FLOMAX ) 0.4 MG CAPS capsule Take 0.4 mg by mouth daily.   traMADol  (ULTRAM ) 50 MG tablet Take 1 tablet (50 mg total) by mouth at bedtime as needed for moderate pain.   predniSONE  (DELTASONE ) 20 MG tablet 3 tabs poqday 1-2, 2 tabs poqday 3-4, 1 tab poqday 5-6   No facility-administered encounter medications on file as of 04/03/2024.   Hearing/Vision screen Hearing Screening - Comments:: Patient is able to hear conversational tones without difficulty. No issues reported.   Vision Screening - Comments:: Wears rx glasses - up to date with routine eye exams with Dr. Tobie  Immunizations and Health Maintenance Health Maintenance  Topic Date Due   COVID-19 Vaccine (4 - 2025-26 season) 01/08/2024   Zoster Vaccines- Shingrix (1 of 2) 04/03/2024 (Originally 01/03/1960)   Medicare Annual Wellness (AWV)  04/03/2025   Pneumococcal Vaccine: 50+ Years  Completed   Influenza Vaccine  Completed   Meningococcal B Vaccine  Aged Out   DTaP/Tdap/Td  Discontinued        Assessment/Plan:  This is a routine wellness examination for Tyler Allison.  Patient Care Team: Duanne Butler DASEN, MD as PCP - General (Family Medicine) Dann Candyce RAMAN, MD as PCP - Cardiology (Cardiology) Grayce Buddle, RN as Oncology Nurse Navigator Patrcia Cough, MD as Consulting Physician (Radiation Oncology) Crawford, Morna Pickle, NP as Nurse Practitioner (Hematology and Oncology) Starla Wendelyn BIRCH, RN as Registered Nurse Tobie Baptist, MD as Consulting Physician (Ophthalmology) Joshua Alm Hamilton, MD as Consulting Physician (Neurosurgery) Renda Glance, MD as  Consulting Physician (Urology)  I have personally reviewed and noted the following in the patient's chart:   Medical and social history Use of alcohol, tobacco or illicit drugs  Current medications and supplements including opioid prescriptions. Functional ability and status Nutritional status Physical activity Advanced directives List of other physicians Hospitalizations, surgeries, and ER visits in previous 12 months Vitals Screenings to include cognitive, depression, and falls Referrals and appointments  No orders of the defined types were placed in this encounter.  In addition, I have reviewed and discussed with patient certain preventive protocols, quality metrics, and best practice recommendations. A written personalized care plan for preventive services as well as general preventive health recommendations were provided to patient.   Lavelle Charmaine Browner, LPN   [id number]   Return in 1 year (on 04/03/2025).  After Visit Summary: (Pick Up)  Due to this being a telephonic visit, with patients personalized plan was offered to patient and patient has requested to Pick up at office.  Nurse Notes: No concerns at this time

## 2024-04-03 NOTE — Patient Instructions (Signed)
 Mr. Holberg,  Thank you for taking the time for your Medicare Wellness Visit. I appreciate your continued commitment to your health goals. Please review the care plan we discussed, and feel free to reach out if I can assist you further.  Please note that Annual Wellness Visits do not include a physical exam. Some assessments may be limited, especially if the visit was conducted virtually. If needed, we may recommend an in-person follow-up with your provider.  Ongoing Care Seeing your primary care provider every 3 to 6 months helps us  monitor your health and provide consistent, personalized care.   Referrals If a referral was made during today's visit and you haven't received any updates within two weeks, please contact the referred provider directly to check on the status.  Recommended Screenings:  Health Maintenance  Topic Date Due   COVID-19 Vaccine (4 - 2025-26 season) 01/08/2024   Zoster (Shingles) Vaccine (1 of 2) 04/03/2024*   Medicare Annual Wellness Visit  04/03/2025   Pneumococcal Vaccine for age over 66  Completed   Flu Shot  Completed   Meningitis B Vaccine  Aged Out   DTaP/Tdap/Td vaccine  Discontinued  *Topic was postponed. The date shown is not the original due date.       04/03/2024   10:20 AM  Advanced Directives  Does Patient Have a Medical Advance Directive? No  Would patient like information on creating a medical advance directive? Yes (MAU/Ambulatory/Procedural Areas - Information given)   Information on Advanced Care Planning can be found at [place]  Secretary of [facility] Advance Health Care Directives Advance Health Care Directives ([email or web address]   Vision: Annual vision screenings are recommended for early detection of glaucoma, cataracts, and diabetic retinopathy. These exams can also reveal signs of chronic conditions such as diabetes and high blood pressure.  Dental: Annual dental screenings help detect early signs of oral cancer, gum disease, and other  conditions linked to overall health, including heart disease and diabetes.  Please see the attached documents for additional preventive care recommendations.

## 2024-04-08 NOTE — Progress Notes (Unsigned)
 Cardiology Clinic Note   Patient Name: Tyler Allison Date of Encounter: 04/10/2024  Primary Care Provider:  Duanne Butler DASEN, MD Primary Cardiologist:  Candyce Reek, MD  Patient Profile    Tyler Allison 83 year old male presents the clinic today for follow-up evaluation of his paroxysmal atrial fibrillation, DOE, and hypertension.  Past Medical History    Past Medical History:  Diagnosis Date   Allergy    Anxiety    Asthma    Cancer (HCC)    prostate cancer   Cervical spondylosis with radiculopathy    left c6 radiculopathy   Chronic back pain    GERD (gastroesophageal reflux disease)    Hyperlipidemia    Hypertension    PAF (paroxysmal atrial fibrillation) (HCC)    Prostate cancer (HCC)    Past Surgical History:  Procedure Laterality Date   BACK SURGERY     MICROLARYNGOSCOPY W/VOCAL CORD INJECTION N/A 10/30/2020   Procedure: MICRODIRECT LARYNGOSCOPY WITH VOCAL CORD INJECTION AND CO2 LASER APPLICATION;  Surgeon: Carlie Clark, MD;  Location: [facility] OR;  Service: ENT;  Laterality: N/A;   PROSTATE BIOPSY     SPINE SURGERY     back surgery x 3 including lumbar fusion, neck surgery x 2 including C4-6 ACDF   tympanoplasty with mastoidectomy Left 01/13/2004    Allergies  Allergies  Allergen Reactions   Codeine Nausea And Vomiting    History of Present Illness    Tyler Allison has a PMH of paroxysmal A-fib, hyperlipidemia, persistent shortness of breath (previously felt to be due to deconditioning), prior tobacco abuse, and central apnea.  He was found to have an irregular heartbeat during neck surgery.  He was placed on the monitor which showed atrial fibrillation.  He was noted to have nocturnal bradycardia with 2-second pauses.  He was placed on Xarelto .  Echocardiogram 4/20 showed an LVEF of 55-60%, and no wall motion abnormalities.   He was seen in follow-up by Dr.Varanasi 4/22.  He was limited in his walking due to joint pain.  It was felt that his  shortness of breath was due to deconditioning.  He did note improved breathing with the addition of Symbicort .  He was doing yard work at that time and denied chest pain and significant breathing issues.  He denied lower extremity swelling and bleeding issues.  He was seen in follow-up by Tyler Fabry, PA-C on 10/10/2022.  During that time he was having some shortness of breath.  He was following with pulmonology.  They had tried a couple different medications and were not having much success.  He reported shortness of breath with exertion.  He had tried to go to the gym a couple times per week but was limited in his physical activity.  He was noted to be in normal sinus rhythm.  He was on Eliquis  and reported that he was about to enter the donut hole.  He was given patient assistance paperwork to fill out.  He planned to have repeat lipids and LFTs with his PCP in a few months.  He denied chest pain, pressure, tightness.  He denied lower extremity swelling and palpitations.  He presented to the clinic 05/31/23 for follow-up evaluation and stated he felt that his breathing was becoming worse.  He was seen by pulmonology who did not feel that his lung issues were in line with his shortness of breath.  Pulmonology recommended that he return to cardiology for follow-up testing.  I reviewed options of coronary CTA  and nuclear stress testing.  He expressed understanding.  He wished to contact his insurance to evaluate cost of both tests and then proceed.  We reviewed his blood pressure, hyperlipidemia, and previous echocardiogram.  He expressed understanding.  We also reviewed his previous cardiac event monitor.  I  planned follow-up after testing.  He noted that he would contact the office to let us  know which test he wishes to proceed with.  He presents to the clinic today for follow-up evaluation and states he has been doing fairly well.  He is physically active walking 15 to 20 minutes most days of the week.  He  denies chest pain with this.  He notes that he has stable shortness of breath and he attributes this to COPD and asthma.  He notices more issues with his asthma during certain times throughout the year.  We reviewed his previous clinic visit and he asked for samples of Eliquis .  I will provide these and plan follow-up in 9 to 12 months.  He has been stable from a cardiac standpoint.  Today he denies chest pain,  lower extremity edema, fatigue, palpitations, melena, hematuria, hemoptysis, diaphoresis, weakness, presyncope, syncope, orthopnea, and PND.  Home Medications    Prior to Admission medications   Medication Sig Start Date End Date Taking? Authorizing Provider  acetaminophen  (TYLENOL ) 500 MG tablet Take 500 mg by mouth every 6 (six) hours as needed for moderate pain.    [provider]  albuterol  (PROAIR  HFA) 108 (90 Base) MCG/ACT inhaler Inhale 2 puffs into the lungs every 4 (four) hours as needed. 11/21/22   Neysa Rama D, MD  apixaban  (ELIQUIS ) 5 MG TABS tablet Take 1 tablet (5 mg total) by mouth 2 (two) times daily. 12/30/22   Duanne Butler DASEN, MD  atorvastatin  (LIPITOR) 40 MG tablet TAKE 1 TABLET(40 MG) BY MOUTH DAILY 05/30/22   Duanne Butler DASEN, MD  clonazePAM  (KLONOPIN ) 0.5 MG tablet TAKE 1 TABLET(0.5 MG) BY MOUTH TWICE DAILY AS NEEDED FOR ANXIETY 10/25/19   Duanne Butler DASEN, MD  cyclobenzaprine  (FLEXERIL ) 10 MG tablet Take 1 tablet (10 mg total) by mouth 3 (three) times daily as needed for muscle spasms. 11/22/22   Duanne Butler DASEN, MD  fluticasone  (FLONASE ) 50 MCG/ACT nasal spray instill two SPRAYS in each nostril daily AT night 08/26/22   Duanne Butler DASEN, MD  fluticasone  furoate-vilanterol (BREO ELLIPTA ) 200-25 MCG/ACT AEPB Inhale 1 puff into the lungs daily. 11/04/22   Meade Verdon RAMAN, MD  ipratropium-albuterol  (DUONEB) 0.5-2.5 (3) MG/3ML SOLN Take 3 mLs by nebulization every 6 (six) hours as needed for up to 12 doses. 02/14/23   Jerral Meth, MD  loratadine   (CLARITIN ) 10 MG tablet Take 1 tablet (10 mg total) by mouth daily. 08/20/21   Cobb, Comer GAILS, NP  Melatonin 10 MG TABS Take 10 mg by mouth at bedtime as needed (sleep).    [provider]  metoprolol  succinate (TOPROL -XL) 25 MG 24 hr tablet TAKE ONE TABLET BY MOUTH EVERY EVENING 11/24/22   Dann Candyce RAMAN, MD  pantoprazole  (PROTONIX ) 40 MG tablet TAKE 1 TABLET(40 MG) BY MOUTH TWICE DAILY 01/18/23   Duanne Butler DASEN, MD  predniSONE  (DELTASONE ) 5 MG tablet 2 daily x 1 week, then one daily x 3 weeks. 03/06/23   Neysa Rama D, MD  tamsulosin  (FLOMAX ) 0.4 MG CAPS capsule Take 0.4 mg by mouth daily. 02/22/23   [provider]  traMADol  (ULTRAM ) 50 MG tablet Take 1 tablet (50 mg total) by mouth  at bedtime as needed for moderate pain. 12/02/22   Duanne Butler DASEN, MD  triamcinolone  cream (KENALOG ) 0.1 % Apply 1 Application topically 2 (two) times daily. 01/31/23   Duanne Butler DASEN, MD    Family History    Family History  Problem Relation Age of Onset   Heart failure Mother    Esophageal cancer Father    Heart attack Maternal Uncle    Breast cancer Neg Hx    Prostate cancer Neg Hx    Pancreatic cancer Neg Hx    Colon cancer Neg Hx    He indicated that his mother is deceased. He indicated that his father is deceased. He indicated that his maternal grandmother is deceased. He indicated that his maternal grandfather is deceased. He indicated that his paternal grandmother is deceased. He indicated that his paternal grandfather is deceased. He indicated that his maternal uncle is deceased. He indicated that the status of his neg hx is unknown.  Social History    Social History   Socioeconomic History   Marital status: Married    Spouse name: Not on file   Number of children: 2   Years of education: Not on file   Highest education level: Not on file  Occupational History    Comment: retired  Tobacco Use   Smoking status: Former    Current packs/day: 0.00    Average  packs/day: 0.5 packs/day for 13.5 years (6.7 ttl pk-yrs)    Types: Cigarettes    Start date: 89    Quit date: 10/23/1968    Years since quitting: 55.5   Smokeless tobacco: Never  Vaping Use   Vaping status: Never Used  Substance and Sexual Activity   Alcohol use: No   Drug use: No   Sexual activity: Not Currently    Comment: married, retired.  Other Topics Concern   Not on file  Social History Narrative   2 children but 1 deceased   Social Drivers of Health   Financial Resource Strain: Medium Risk (04/03/2023)   Overall Financial Resource Strain (CARDIA)    Difficulty of Paying Living Expenses: Somewhat hard  Food Insecurity: No Food Insecurity (04/03/2024)   Hunger Vital Sign    Worried About Running Out of Food in the Last Year: Never true    Ran Out of Food in the Last Year: Never true  Transportation Needs: No Transportation Needs (04/03/2024)   PRAPARE - Administrator, Civil Service (Medical): No    Lack of Transportation (Non-Medical): No  Physical Activity: Insufficiently Active (04/03/2024)   Exercise Vital Sign    Days of Exercise per Week: 2 days    Minutes of Exercise per Session: 30 min  Stress: No Stress Concern Present (04/03/2024)   [employer] of Occupational Health - Occupational Stress Questionnaire    Feeling of Stress: Not at all  Social Connections: Socially Integrated (04/03/2024)   Social Connection and Isolation Panel    Frequency of Communication with Friends and Family: More than three times a week    Frequency of Social Gatherings with Friends and Family: Once a week    Attends Religious Services: More than 4 times per year    Active Member of [employer] or Organizations: Yes    Attends Banker Meetings: More than 4 times per year    Marital Status: Married  Catering Manager Violence: Not At Risk (04/03/2024)   Humiliation, Afraid, Rape, and Kick questionnaire    Fear of Current or Ex-Partner: No  Emotionally  Abused: No    Physically Abused: No    Sexually Abused: No     Review of Systems    General:  No chills, fever, night sweats or weight changes.  Cardiovascular:  No chest pain, dyspnea on exertion, edema, orthopnea, palpitations, paroxysmal nocturnal dyspnea. Dermatological: No rash, lesions/masses Respiratory: No cough, dyspnea Urologic: No hematuria, dysuria Abdominal:   No nausea, vomiting, diarrhea, bright red blood per rectum, melena, or hematemesis Neurologic:  No visual changes, wkns, changes in mental status. All other systems reviewed and are otherwise negative except as noted above.  Physical Exam    VS:  BP 124/68   Pulse 78   Ht 5' 10 (1.778 m)   Wt 204 lb (92.5 kg)   SpO2 97%   BMI 29.27 kg/m  , BMI Body mass index is 29.27 kg/m. GEN: Well nourished, well developed, in no acute distress. HEENT: normal. Neck: Supple, no JVD, carotid bruits, or masses. Cardiac: RRR, no murmurs, rubs, or gallops. No clubbing, cyanosis, edema.  Radials/DP/PT 2+ and equal bilaterally.  Respiratory:  Respirations regular and unlabored, clear to auscultation bilaterally. GI: Soft, nontender, nondistended, BS + x 4. MS: no deformity or atrophy. Skin: warm and dry, no rash. Neuro:  Strength and sensation are intact. Psych: Normal affect.  Accessory Clinical Findings    Recent Labs: No results found for requested labs within last 365 days.   Recent Lipid Panel    Component Value Date/Time   CHOL 149 10/20/2022 1457   TRIG 183 (H) 10/20/2022 1457   HDL 54 10/20/2022 1457   CHOLHDL 2.8 10/20/2022 1457   VLDL 31 (H) 03/13/2015 0816   LDLCALC 69 10/20/2022 1457         ECG personally reviewed by me today-    none today.  Echocardiogram 11/07/2022   IMPRESSIONS     1. Left ventricular ejection fraction, by estimation, is 60 to 65%. The  left ventricle has normal function. The left ventricle has no regional  wall motion abnormalities. Left ventricular diastolic parameters  are  consistent with Grade I diastolic  dysfunction (impaired relaxation). The average left ventricular global  longitudinal strain is -16.2 %. The global longitudinal strain is normal.   2. Right ventricular systolic function is normal. The right ventricular  size is normal. There is moderately elevated pulmonary artery systolic  pressure.   3. Mild bi leaflet prolapse . The mitral valve is abnormal. Mild to  moderate mitral valve regurgitation. No evidence of mitral stenosis.   4. The aortic valve is tricuspid. There is mild calcification of the  aortic valve. Aortic valve regurgitation is mild. Aortic valve sclerosis  is present, with no evidence of aortic valve stenosis.   5. Aortic dilatation noted. There is mild dilatation of the ascending  aorta, measuring 38 mm.   6. The inferior vena cava is normal in size with greater than 50%  respiratory variability, suggesting right atrial pressure of 3 mmHg.   FINDINGS   Left Ventricle: Left ventricular ejection fraction, by estimation, is 60  to 65%. The left ventricle has normal function. The left ventricle has no  regional wall motion abnormalities. The average left ventricular global  longitudinal strain is -16.2 %.  The global longitudinal strain is normal. The left ventricular internal  cavity size was normal in size. There is no left ventricular hypertrophy.  Left ventricular diastolic parameters are consistent with Grade I  diastolic dysfunction (impaired  relaxation).   Right Ventricle: The right ventricular  size is normal. No increase in  right ventricular wall thickness. Right ventricular systolic function is  normal. There is moderately elevated pulmonary artery systolic pressure.  The tricuspid regurgitant velocity is  3.30 m/s, and with an assumed right atrial pressure of 3 mmHg, the  estimated right ventricular systolic pressure is 46.6 mmHg.   Left Atrium: Left atrial size was normal in size.   Right Atrium: Right  atrial size was normal in size.   Pericardium: There is no evidence of pericardial effusion.   Mitral Valve: Mild bi leaflet prolapse. The mitral valve is abnormal.  There is mild thickening of the mitral valve leaflet(s). There is mild  calcification of the mitral valve leaflet(s). Mild to moderate mitral  valve regurgitation. No evidence of mitral  valve stenosis.   Tricuspid Valve: The tricuspid valve is normal in structure. Tricuspid  valve regurgitation is mild . No evidence of tricuspid stenosis.   Aortic Valve: The aortic valve is tricuspid. There is mild calcification  of the aortic valve. Aortic valve regurgitation is mild. Aortic  regurgitation PHT measures 340 msec. Aortic valve sclerosis is present,  with no evidence of aortic valve stenosis.   Pulmonic Valve: The pulmonic valve was normal in structure. Pulmonic valve  regurgitation is not visualized. No evidence of pulmonic stenosis.   Aorta: Aortic dilatation noted. There is mild dilatation of the ascending  aorta, measuring 38 mm.   Venous: The inferior vena cava is normal in size with greater than 50%  respiratory variability, suggesting right atrial pressure of 3 mmHg.   IAS/Shunts: No atrial level shunt detected by color flow Doppler.       Assessment & Plan   1. Paroxysmal atrial fibrillation-heart rate today 78 bpm.  He continues to deny episodes of irregular or accelerated heart rate.  Compliant with apixaban .  He denies bleeding issues. Continue metoprolol , apixaban  Avoid triggers caffeine, chocolate, EtOH, dehydration excetra. Order CBC, BMP Provide samples of Eliquis  if available   DOE, decreased endurance-chronic.  Stable and breathing somewhat improved.  It was previously felt that his increased work of breathing was related to deconditioning.  Physical activity and daily activities stable.  He is somewhat limited in his physical activity due to his leg pain which is chronic.  Did have recent visit  to urgent care for treatment of sinusitis.  Has recovered well. Heart healthy low-sodium diet-reviewed Will defer ischemic evaluation at this time  Essential hypertension-BP today 124/68 Maintain blood pressure log Continue current medical therapy Heart healthy low-sodium diet  Hyperlipidemia-LDL 69 on 10/20/22.  High-fiber diet Continue Lipitor daily Follows with PCP  Disposition: Follow-up with Dr. Kriste or me in 9-12 months.  Josefa HERO. Travanti Mcmanus NP-C     04/10/2024, 1:46 PM [facility] [address] Office [phone] Fax [phone]    I spent 14 minutes examining this patient, reviewing medications, and using patient centered shared decision making involving their cardiac care.   I spent greater than 20 minutes reviewing their past medical history,  medications, and prior cardiac tests.

## 2024-04-10 ENCOUNTER — Ambulatory Visit: Attending: Cardiology | Admitting: General Practice

## 2024-04-10 ENCOUNTER — Encounter: Payer: Self-pay | Admitting: General Practice

## 2024-04-10 ENCOUNTER — Other Ambulatory Visit (HOSPITAL_BASED_OUTPATIENT_CLINIC_OR_DEPARTMENT_OTHER): Payer: Self-pay | Admitting: General Practice

## 2024-04-10 ENCOUNTER — Telehealth: Payer: Self-pay | Admitting: Pharmacist

## 2024-04-10 VITALS — BP 124/68 | HR 78 | Ht 70.0 in | Wt 204.0 lb

## 2024-04-10 DIAGNOSIS — R0609 Other forms of dyspnea: Secondary | ICD-10-CM

## 2024-04-10 DIAGNOSIS — E782 Mixed hyperlipidemia: Secondary | ICD-10-CM | POA: Diagnosis not present

## 2024-04-10 DIAGNOSIS — I480 Paroxysmal atrial fibrillation: Secondary | ICD-10-CM

## 2024-04-10 DIAGNOSIS — I10 Essential (primary) hypertension: Secondary | ICD-10-CM | POA: Diagnosis not present

## 2024-04-10 MED ORDER — APIXABAN 5 MG PO TABS: 5.0000 mg | ORAL_TABLET | Freq: Two times a day (BID) | ORAL | 3 refills | Status: AC

## 2024-04-10 MED ORDER — APIXABAN 5 MG PO TABS: 5.0000 mg | ORAL_TABLET | Freq: Two times a day (BID) | ORAL | 0 refills | Status: AC

## 2024-04-10 NOTE — Patient Instructions (Addendum)
 Medication Instructions:  Your physician recommends that you continue on your current medications as directed. Please refer to the Current Medication list given to you today.  *If you need a refill on your cardiac medications before your next appointment, please call your pharmacy*  Lab Work: Today- CBC, BMET If you have labs (blood work) drawn today and your tests are completely normal, you will receive your results only by: MyChart Message (if you have MyChart) OR A paper copy in the mail If you have any lab test that is abnormal or we need to change your treatment, we will call you to review the results.   Follow-Up: At [facility], you and your health needs are our priority.  As part of our continuing mission to provide you with exceptional heart care, our providers are all part of one team.  This team includes your primary Cardiologist (physician) and Advanced Practice Providers or APPs (Physician Assistants and Nurse Practitioners) who all work together to provide you with the care you need, when you need it.  Your next appointment:   9-12 month(s)  Provider:   Emeline Calender, MD or Josefa Beauvais, NP

## 2024-04-10 NOTE — Telephone Encounter (Signed)
 Eliquis  samples per Josefa Beauvais

## 2024-04-11 ENCOUNTER — Ambulatory Visit: Payer: Self-pay | Admitting: General Practice

## 2024-04-11 LAB — CBC
Hematocrit: 39.1 % (ref 37.5–51.0)
Hemoglobin: 12.9 g/dL — ABNORMAL LOW (ref 13.0–17.7)
MCH: 31.6 pg (ref 26.6–33.0)
MCHC: 33 g/dL (ref 31.5–35.7)
MCV: 96 fL (ref 79–97)
Platelets: 227 x10E3/uL (ref 150–450)
RBC: 4.08 x10E6/uL — ABNORMAL LOW (ref 4.14–5.80)
RDW: 12.2 % (ref 11.6–15.4)
WBC: 6.6 x10E3/uL (ref 3.4–10.8)

## 2024-04-11 LAB — BASIC METABOLIC PANEL WITH GFR
BUN/Creatinine Ratio: 21 (ref 10–24)
BUN: 17 mg/dL (ref 8–27)
CO2: 23 mmol/L (ref 20–29)
Calcium: 8.9 mg/dL (ref 8.6–10.2)
Chloride: 102 mmol/L (ref 96–106)
Creatinine, Ser: 0.82 mg/dL (ref 0.76–1.27)
Glucose: 87 mg/dL (ref 70–99)
Potassium: 4.2 mmol/L (ref 3.5–5.2)
Sodium: 139 mmol/L (ref 134–144)
eGFR: 87 mL/min/1.73 (ref >59)

## 2024-04-12 LAB — BASIC METABOLIC PANEL WITH GFR

## 2024-04-12 LAB — CBC

## 2024-04-15 ENCOUNTER — Ambulatory Visit: Payer: Self-pay | Admitting: General Practice

## 2024-05-15 ENCOUNTER — Other Ambulatory Visit: Payer: Self-pay | Admitting: Family Medicine

## 2024-05-29 ENCOUNTER — Other Ambulatory Visit: Payer: Self-pay | Admitting: Family Medicine

## 2024-05-29 DIAGNOSIS — K219 Gastro-esophageal reflux disease without esophagitis: Secondary | ICD-10-CM

## 2024-05-29 NOTE — Telephone Encounter (Signed)
 Requested medication (s) are due for refill today -yes  Requested medication (s) are on the active medication list -yes  Future visit scheduled -no  Last refill: 04/01/24 #60 1RF  Notes to clinic: Last OV- acute 02/13/24,01/02/24,01/31/23- patient may be due OV with PCP- gets labs at cardiologist. Sent for review   Requested Prescriptions  Pending Prescriptions Disp Refills   pantoprazole  (PROTONIX ) 40 MG tablet [Pharmacy Med Name: PANTOPRAZOLE  SOD DR 40 MG TAB] 180 tablet 1    Sig: TAKE 1 TABLET(40 MG) BY MOUTH TWICE DAILY.     Gastroenterology: Proton Pump Inhibitors Passed - 05/29/2024  3:32 PM      Passed - Valid encounter within last 12 months    Recent Outpatient Visits           3 months ago Lumbar radiculopathy   [place] [facility] Kayla Jeoffrey RAMAN, FNP   4 months ago Itching   [place] [facility] Duanne, Butler DASEN, MD   1 year ago Acute nonintractable headache, unspecified headache type   [place] [facility] Kayla Jeoffrey RAMAN, FNP   1 year ago Palpitations   [place] [facility] Duanne, Butler DASEN, MD   1 year ago Acute cystitis with hematuria   [place] [facility] Duanne Butler DASEN, MD                 Requested Prescriptions  Pending Prescriptions Disp Refills   pantoprazole  (PROTONIX ) 40 MG tablet [Pharmacy Med Name: PANTOPRAZOLE  SOD DR 40 MG TAB] 180 tablet 1    Sig: TAKE 1 TABLET(40 MG) BY MOUTH TWICE DAILY.     Gastroenterology: Proton Pump Inhibitors Passed - 05/29/2024  3:32 PM      Passed - Valid encounter within last 12 months    Recent Outpatient Visits           3 months ago Lumbar radiculopathy   [place] [facility] Kayla Jeoffrey RAMAN, FNP   4 months ago Itching   [place] [facility] Duanne, Butler DASEN, MD   1 year ago Acute nonintractable headache, unspecified headache type   [place] [facility]  Med[facility] Kayla Jeoffrey RAMAN, FNP   1 year ago Palpitations   [place] [facility] Duanne, Butler DASEN, MD   1 year ago Acute cystitis with hematuria    [place] [facility] Pickard, Butler DASEN, MD

## 2024-06-14 ENCOUNTER — Telehealth: Payer: Self-pay

## 2024-06-14 NOTE — Telephone Encounter (Signed)
 Copied from CRM [id number]. Topic: General - Other >> Jun 14, 2024 12:04 PM Berneda FALCON wrote: Reason for CRM: Patient states he saw commercials talking about the lower cost of apixaban  (ELIQUIS ) 5 MG TABS tablet and he is down to 6-7 days left for this and his current insurance [facility] charges $300-400 and he cannot afford it. He would like more information about the discount program and what he can do to get to be a part of it. He also would like to know we could provide some samples until the apixaban  (ELIQUIS ) 5 MG TABS tablet discounts become available.  Patient callback is [phone] (home)
